# Patient Record
Sex: Female | Born: 1960
Health system: Southern US, Community
[De-identification: ages and names within clinical notes are randomized; demographics above are authoritative.]

## PROBLEM LIST (undated history)

## (undated) DIAGNOSIS — M2550 Pain in unspecified joint: Secondary | ICD-10-CM

## (undated) DIAGNOSIS — M549 Dorsalgia, unspecified: Secondary | ICD-10-CM

## (undated) DIAGNOSIS — I4892 Unspecified atrial flutter: Secondary | ICD-10-CM

## (undated) DIAGNOSIS — G43909 Migraine, unspecified, not intractable, without status migrainosus: Secondary | ICD-10-CM

## (undated) DIAGNOSIS — T7840XA Allergy, unspecified, initial encounter: Secondary | ICD-10-CM

## (undated) DIAGNOSIS — G4730 Sleep apnea, unspecified: Secondary | ICD-10-CM

## (undated) DIAGNOSIS — R0602 Shortness of breath: Secondary | ICD-10-CM

## (undated) DIAGNOSIS — G629 Polyneuropathy, unspecified: Secondary | ICD-10-CM

## (undated) DIAGNOSIS — E669 Obesity, unspecified: Secondary | ICD-10-CM

## (undated) DIAGNOSIS — K5900 Constipation, unspecified: Secondary | ICD-10-CM

## (undated) DIAGNOSIS — J45909 Unspecified asthma, uncomplicated: Secondary | ICD-10-CM

## (undated) DIAGNOSIS — H269 Unspecified cataract: Secondary | ICD-10-CM

## (undated) DIAGNOSIS — E119 Type 2 diabetes mellitus without complications: Secondary | ICD-10-CM

## (undated) DIAGNOSIS — F5102 Adjustment insomnia: Secondary | ICD-10-CM

## (undated) DIAGNOSIS — I441 Atrioventricular block, second degree: Secondary | ICD-10-CM

## (undated) DIAGNOSIS — R5383 Other fatigue: Secondary | ICD-10-CM

## (undated) DIAGNOSIS — R32 Unspecified urinary incontinence: Secondary | ICD-10-CM

## (undated) DIAGNOSIS — F32A Depression, unspecified: Secondary | ICD-10-CM

## (undated) DIAGNOSIS — I10 Essential (primary) hypertension: Secondary | ICD-10-CM

## (undated) DIAGNOSIS — Z8679 Personal history of other diseases of the circulatory system: Secondary | ICD-10-CM

## (undated) DIAGNOSIS — Z859 Personal history of malignant neoplasm, unspecified: Secondary | ICD-10-CM

## (undated) DIAGNOSIS — M719 Bursopathy, unspecified: Secondary | ICD-10-CM

## (undated) HISTORY — DX: Dorsalgia, unspecified: M54.9

## (undated) HISTORY — PX: ABDOMINAL HYSTERECTOMY: SHX81

## (undated) HISTORY — DX: Unspecified atrial flutter: I48.92

## (undated) HISTORY — DX: Allergy, unspecified, initial encounter: T78.40XA

## (undated) HISTORY — DX: Constipation, unspecified: K59.00

## (undated) HISTORY — DX: Depression, unspecified: F32.A

## (undated) HISTORY — DX: Obesity, unspecified: E66.9

## (undated) HISTORY — DX: Sleep apnea, unspecified: G47.30

## (undated) HISTORY — DX: Personal history of malignant neoplasm, unspecified: Z85.9

## (undated) HISTORY — DX: Unspecified urinary incontinence: R32

## (undated) HISTORY — DX: Adjustment insomnia: F51.02

## (undated) HISTORY — DX: Polyneuropathy, unspecified: G62.9

## (undated) HISTORY — DX: Migraine, unspecified, not intractable, without status migrainosus: G43.909

## (undated) HISTORY — DX: Other fatigue: R53.83

## (undated) HISTORY — DX: Unspecified cataract: H26.9

## (undated) HISTORY — DX: Pain in unspecified joint: M25.50

## (undated) HISTORY — DX: Shortness of breath: R06.02

## (undated) HISTORY — DX: Personal history of other diseases of the circulatory system: Z86.79

---

## 2006-05-31 ENCOUNTER — Ambulatory Visit: Payer: Self-pay | Admitting: Family Medicine

## 2006-12-13 ENCOUNTER — Ambulatory Visit: Payer: Self-pay | Admitting: Family Medicine

## 2007-11-17 ENCOUNTER — Ambulatory Visit: Payer: Self-pay | Admitting: Family Medicine

## 2007-11-21 ENCOUNTER — Ambulatory Visit: Payer: Self-pay | Admitting: Family Medicine

## 2008-03-19 ENCOUNTER — Ambulatory Visit: Payer: Self-pay | Admitting: Family Medicine

## 2008-07-16 ENCOUNTER — Ambulatory Visit: Payer: Self-pay | Admitting: Family Medicine

## 2009-07-17 ENCOUNTER — Ambulatory Visit: Payer: Self-pay | Admitting: Family Medicine

## 2009-08-05 ENCOUNTER — Ambulatory Visit: Payer: Self-pay | Admitting: Internal Medicine

## 2009-08-22 ENCOUNTER — Ambulatory Visit: Payer: Self-pay | Admitting: Internal Medicine

## 2009-08-22 LAB — HM COLONOSCOPY: HM Colonoscopy: NORMAL

## 2009-10-05 HISTORY — PX: COLONOSCOPY: SHX174

## 2009-12-31 ENCOUNTER — Ambulatory Visit: Payer: Self-pay | Admitting: Family Medicine

## 2009-12-31 ENCOUNTER — Encounter: Admission: RE | Admit: 2009-12-31 | Discharge: 2009-12-31 | Payer: Self-pay | Admitting: Family Medicine

## 2010-01-14 ENCOUNTER — Ambulatory Visit: Payer: Self-pay | Admitting: Family Medicine

## 2010-03-05 ENCOUNTER — Ambulatory Visit: Payer: Self-pay | Admitting: Physician Assistant

## 2010-07-28 ENCOUNTER — Ambulatory Visit: Payer: Self-pay | Admitting: Family Medicine

## 2010-07-29 LAB — HM COLONOSCOPY: HM Colonoscopy: NORMAL

## 2010-08-08 LAB — HM MAMMOGRAPHY: HM Mammogram: NEGATIVE

## 2010-09-03 ENCOUNTER — Ambulatory Visit: Payer: Self-pay | Admitting: Family Medicine

## 2010-10-05 LAB — HM PAP SMEAR

## 2011-03-18 ENCOUNTER — Encounter: Payer: Self-pay | Admitting: Family Medicine

## 2011-03-18 ENCOUNTER — Ambulatory Visit (INDEPENDENT_AMBULATORY_CARE_PROVIDER_SITE_OTHER): Payer: 59 | Admitting: Family Medicine

## 2011-03-18 ENCOUNTER — Telehealth: Payer: Self-pay | Admitting: Family Medicine

## 2011-03-18 VITALS — BP 140/90 | HR 72 | Wt 255.0 lb

## 2011-03-18 DIAGNOSIS — J301 Allergic rhinitis due to pollen: Secondary | ICD-10-CM | POA: Insufficient documentation

## 2011-03-18 DIAGNOSIS — N390 Urinary tract infection, site not specified: Secondary | ICD-10-CM

## 2011-03-18 DIAGNOSIS — E669 Obesity, unspecified: Secondary | ICD-10-CM

## 2011-03-18 DIAGNOSIS — Z8669 Personal history of other diseases of the nervous system and sense organs: Secondary | ICD-10-CM | POA: Insufficient documentation

## 2011-03-18 DIAGNOSIS — N3941 Urge incontinence: Secondary | ICD-10-CM

## 2011-03-18 LAB — POCT URINALYSIS DIPSTICK
Blood, UA: 250
Glucose, UA: NEGATIVE
Leukocytes, UA: NEGATIVE
Protein, UA: NEGATIVE
Spec Grav, UA: 1.015

## 2011-03-18 MED ORDER — SOLIFENACIN SUCCINATE 5 MG PO TABS: 5.0000 mg | ORAL_TABLET | Freq: Every day | ORAL | Status: DC

## 2011-03-18 MED ORDER — TOLTERODINE TARTRATE ER 4 MG PO CP24: 4.0000 mg | ORAL_CAPSULE | Freq: Every day | ORAL | Status: DC

## 2011-03-18 MED ORDER — CIPROFLOXACIN HCL 500 MG PO TABS: 500.0000 mg | ORAL_TABLET | Freq: Two times a day (BID) | ORAL | Status: AC

## 2011-03-18 NOTE — Telephone Encounter (Signed)
She was switched to Detrol to help save money.

## 2011-03-18 NOTE — Patient Instructions (Signed)
Take all of your antibiotic and call me if you're not better. Also let me know how the Vesicare works to help with your leakage

## 2011-03-18 NOTE — Progress Notes (Signed)
  Subjective:    Patient ID: Laurie Mckinney, female    DOB: Feb 15, 1961, 50 y.o.   MRN: [id number]  HPI 2 days ago she noted cloudy urine that was foul-smelling. Yesterday she developed urgency, dysuria and saw some blood in her urine. No fever, chills or abdominal pain. She also has a several year history of both urge and stress incontinence. She has discussed this with her gynecologist and apparently this is now getting to become more of an issue.  Review of Systems     Objective:   Physical Exam Alert and in no distress. No pain on palpation of the abdomen. Urine microscopic was positive for white cells       Assessment & Plan:  UTI. Stress and urge incontinence  I will treat her again with Cipro. I will also refer her for physical therapy for Koegel maneuvers. She was originally given Information systems manager however it was too expensive and therefore switched to [employer] area

## 2011-04-29 ENCOUNTER — Encounter: Payer: Self-pay | Admitting: Family Medicine

## 2011-05-08 ENCOUNTER — Other Ambulatory Visit: Payer: Self-pay | Admitting: Family Medicine

## 2011-05-08 NOTE — Telephone Encounter (Signed)
Is this ok?

## 2011-05-08 NOTE — Telephone Encounter (Signed)
Med was done by Dr.L

## 2011-08-14 ENCOUNTER — Encounter: Payer: Self-pay | Admitting: Family Medicine

## 2011-08-14 ENCOUNTER — Ambulatory Visit (INDEPENDENT_AMBULATORY_CARE_PROVIDER_SITE_OTHER): Payer: 59 | Admitting: Family Medicine

## 2011-08-14 VITALS — BP 150/90 | HR 60 | Ht 72.0 in | Wt 260.0 lb

## 2011-08-14 DIAGNOSIS — F341 Dysthymic disorder: Secondary | ICD-10-CM

## 2011-08-14 DIAGNOSIS — J301 Allergic rhinitis due to pollen: Secondary | ICD-10-CM

## 2011-08-14 DIAGNOSIS — Z0000 Encounter for general adult medical examination without abnormal findings: Secondary | ICD-10-CM

## 2011-08-14 DIAGNOSIS — E669 Obesity, unspecified: Secondary | ICD-10-CM

## 2011-08-14 DIAGNOSIS — E559 Vitamin D deficiency, unspecified: Secondary | ICD-10-CM

## 2011-08-14 LAB — LIPID PANEL
Total CHOL/HDL Ratio: 2.5 Ratio
Triglycerides: 50 mg/dL (ref <150)
VLDL: 10 mg/dL (ref 0–40)

## 2011-08-14 LAB — CBC WITH DIFFERENTIAL/PLATELET
Basophils Relative: 0 % (ref 0–1)
Eosinophils Relative: 1 % (ref 0–5)
Hemoglobin: 14.2 g/dL (ref 12.0–15.0)
MCV: 93.4 fL (ref 78.0–100.0)
Neutro Abs: 4.6 10*3/uL (ref 1.7–7.7)
Neutrophils Relative %: 60 % (ref 43–77)
Platelets: 307 10*3/uL (ref 150–400)
RBC: 4.7 MIL/uL (ref 3.87–5.11)
RDW: 13.3 % (ref 11.5–15.5)
WBC: 7.6 10*3/uL (ref 4.0–10.5)

## 2011-08-14 LAB — POCT URINALYSIS DIPSTICK
Bilirubin, UA: NEGATIVE
Protein, UA: NEGATIVE
Spec Grav, UA: 1.02
pH, UA: 7

## 2011-08-14 LAB — COMPREHENSIVE METABOLIC PANEL
AST: 23 U/L (ref 0–37)
Albumin: 4.1 g/dL (ref 3.5–5.2)
BUN: 13 mg/dL (ref 6–23)
Calcium: 9.5 mg/dL (ref 8.4–10.5)
Creat: 0.71 mg/dL (ref 0.50–1.10)
Glucose, Bld: 85 mg/dL (ref 70–99)
Potassium: 4.2 mEq/L (ref 3.5–5.3)
Sodium: 140 mEq/L (ref 135–145)
Total Bilirubin: 0.5 mg/dL (ref 0.3–1.2)
Total Protein: 7.7 g/dL (ref 6.0–8.3)

## 2011-08-14 NOTE — Progress Notes (Signed)
  Subjective:    Patient ID: Laurie Mckinney, female    DOB: 01-Apr-1961, 50 y.o.   MRN: [id number]  HPI She is here for complete examination. She does have sleep disturbance mainly due to her busy schedule and the fact that she's gotten use to splitting up her sleep cycle. She also has had some difficulty with her marriage but seems to be handling this quite well and has done her homework in regard to better communication especially in regard to [employer]. Her allergies are under good control. She continues on Paxil and is having no difficulty with that. He has had some bladder issues but presently is not on any medications for OAB. She has a history of vitamin D deficiency who presently is  on a multivitamin regularly.   Review of Systems  Constitutional: Negative.   Eyes: Negative.   Respiratory: Negative.   Cardiovascular: Negative.   Gastrointestinal: Negative.   Skin: Negative.   Hematological: Negative.   Psychiatric/Behavioral: Negative.        Objective:   Physical Exam BP 150/90  Pulse 60  Ht 6' (1.829 m)  Wt 260 lb (117.935 kg)  BMI 35.26 kg/m2  General Appearance:    Alert, cooperative, no distress, appears stated age  Head:    Normocephalic, without obvious abnormality, atraumatic  Eyes:    PERRL, conjunctiva/corneas clear, EOM's intact, fundi    benign  Ears:    Normal TM's and external ear canals  Nose:   Nares normal, mucosa normal, no drainage or sinus   tenderness  Throat:   Lips, mucosa, and tongue normal; teeth and gums normal  Neck:   Supple, no lymphadenopathy;  thyroid:  no   enlargement/tenderness/nodules; no carotid   bruit or JVD  Back:    Spine nontender, no curvature, ROM normal, no CVA     tenderness  Lungs:     Clear to auscultation bilaterally without wheezes, rales or     ronchi; respirations unlabored  Chest Wall:    No tenderness or deformity   Heart:    Regular rate and rhythm, S1 and S2 normal, no murmur, rub   or gallop  Breast Exam:     Deferred to GYN  Abdomen:     Soft, non-tender, nondistended, normoactive bowel sounds,    no masses, no hepatosplenomegaly  Genitalia:    Deferred to GYN     Extremities:   No clubbing, cyanosis or edema  Pulses:   2+ and symmetric all extremities  Skin:   Skin color, texture, turgor normal, no rashes or lesions  Lymph nodes:   Cervical, supraclavicular, and axillary nodes normal  Neurologic:   CNII-XII intact, normal strength, sensation and gait; reflexes 2+ and symmetric throughout          Psych:   Normal mood, affect, hygiene and grooming.           Assessment & Plan:   1. Physical exam, annual  POCT Urinalysis Dipstick, CBC with Differential, Comprehensive metabolic panel, Lipid panel  2. Allergic rhinitis due to pollen    3. Obesity (BMI 30-39.9)  CBC with Differential, Comprehensive metabolic panel, Lipid panel  4. Dysthymia    5. Vitamin D deficiency  Vitamin D 25 hydroxy   encouraged her to continue to work with her husband in regard to that medication about their married life and sex life. Strongly encouraged her to make sure she includes taking care of herself and not just other people. Routine blood screening.

## 2011-09-14 ENCOUNTER — Telehealth: Payer: Self-pay | Admitting: Medical

## 2011-09-14 NOTE — Telephone Encounter (Signed)
PATIENT BELIEVES SHE HAS A UTI. SHE IS COMING IN TOMORROW. WHAT CAN SHE DO UNTIL THEN?

## 2011-09-14 NOTE — Telephone Encounter (Signed)
PATIENT WAS NOTIFIED. CLS 

## 2011-09-14 NOTE — Telephone Encounter (Signed)
Drink lots of water, drink some cranberry juice, come in tomorrow, can take som Ibuprofen or Tylenol for pain.

## 2011-09-15 ENCOUNTER — Ambulatory Visit (INDEPENDENT_AMBULATORY_CARE_PROVIDER_SITE_OTHER): Payer: 59 | Admitting: Medical

## 2011-09-15 ENCOUNTER — Encounter: Payer: Self-pay | Admitting: Medical

## 2011-09-15 DIAGNOSIS — R350 Frequency of micturition: Secondary | ICD-10-CM

## 2011-09-15 DIAGNOSIS — N3090 Cystitis, unspecified without hematuria: Secondary | ICD-10-CM | POA: Insufficient documentation

## 2011-09-15 LAB — POCT URINALYSIS DIPSTICK
Bilirubin, UA: NEGATIVE
Ketones, UA: NEGATIVE
Nitrite, UA: NEGATIVE
Spec Grav, UA: <=1.005
Urobilinogen, UA: NEGATIVE

## 2011-09-15 MED ORDER — PHENAZOPYRIDINE HCL 100 MG PO TABS: 100.0000 mg | ORAL_TABLET | Freq: Three times a day (TID) | ORAL | Status: AC | PRN

## 2011-09-15 MED ORDER — CIPROFLOXACIN HCL 500 MG PO TABS: 500.0000 mg | ORAL_TABLET | Freq: Two times a day (BID) | ORAL | Status: AC

## 2011-09-15 NOTE — Progress Notes (Signed)
Subjective:   HPI  Laurie Mckinney is a 50 y.o. female who presents with possible UTI.  She notes 2 prior this year.  She reports few day hx/o intense urgency, frequency urination, burning, and not being able to hold the urine some.  Went 10 times to urinate in a 3 hour period yesterday.  Denies belly or back pain, no fever, but some chills.  Using nothing for symptoms.  She did start some cranberry juice with some relief.  LMP 16mo ago.  She is having some premenopausal symptoms such as hot flashes.  She has been having sex with her husband more frequently now that kids are out of the house. No other aggravating or relieving factors.    No other c/o.  The following portions of the patient's history were reviewed and updated as appropriate: allergies, current medications, past family history, past medical history, past social history, past surgical history and problem list.  Past Medical History  Diagnosis Date  . Migraine headache   . Allergy     RHINITIS  . Obesity   . Adjustment insomnia     SHIFT WORK    Review of Systems Constitutional: -fever, -chills, -sweats, -unexpected -weight change,-fatigue ENT: -runny nose, -ear pain, -sore throat Cardiology:  -chest pain, -palpitations, -edema Respiratory: -cough, -shortness of breath, -wheezing Gastroenterology: -abdominal pain, -nausea, -vomiting, -diarrhea, -constipation Hematology: -bleeding or bruising problems Musculoskeletal: -arthralgias, -myalgias, -joint swelling, -back pain Ophthalmology: -vision changes Urology: -dysuria, -difficulty urinating, -hematuria, -urinary frequency, +urgency, +burning,+uncontrol bladder Neurology: -headache, -weakness, -tingling, -numbness     Objective:   Physical Exam  Filed Vitals:   09/15/11 1057  BP: 132/82  Pulse: 72  Temp: 98.2 F (36.8 C)  Resp: 16    General appearance: alert, no distress, WD/WN, white femal Abdomen: +bs, soft, non tender, non distended, no masses, no  hepatomegaly, no splenomegaly Back: no cva tenderness Pulses: 2+ symmetric  Assessment and Plan :    Encounter Diagnoses  Name Primary?  . Cystitis Yes  . Urinary frequency    Cipro and pyridium scripts, discussed prevention, hydrate well, can use cranberry juice, call if worse or not improving.   Follow-up otherwise prn.

## 2011-09-15 NOTE — Patient Instructions (Signed)

## 2011-12-22 ENCOUNTER — Telehealth: Payer: Self-pay | Admitting: Family Medicine

## 2011-12-22 MED ORDER — PAROXETINE HCL 20 MG PO TABS: 20.0000 mg | ORAL_TABLET | ORAL | Status: DC

## 2011-12-22 NOTE — Telephone Encounter (Signed)
Paxil called in

## 2012-06-17 ENCOUNTER — Other Ambulatory Visit: Payer: Self-pay | Admitting: Family Medicine

## 2012-06-17 NOTE — Telephone Encounter (Signed)
I renewed her medication. She will need a followup appointment within the next month or 2

## 2012-06-17 NOTE — Telephone Encounter (Signed)
Packs were renewed for several months. She will need a followup in the next month or 2

## 2012-06-17 NOTE — Telephone Encounter (Signed)
IS THIS OK 

## 2012-08-16 ENCOUNTER — Encounter: Payer: Self-pay | Admitting: Internal Medicine

## 2012-08-19 ENCOUNTER — Encounter: Payer: 59 | Admitting: Family Medicine

## 2012-08-24 ENCOUNTER — Encounter: Payer: Self-pay | Admitting: Family Medicine

## 2012-08-24 ENCOUNTER — Ambulatory Visit (INDEPENDENT_AMBULATORY_CARE_PROVIDER_SITE_OTHER): Payer: 59 | Admitting: Family Medicine

## 2012-08-24 VITALS — BP 130/86 | HR 76 | Ht 72.0 in | Wt 268.0 lb

## 2012-08-24 DIAGNOSIS — Z0000 Encounter for general adult medical examination without abnormal findings: Secondary | ICD-10-CM

## 2012-08-24 DIAGNOSIS — Z23 Encounter for immunization: Secondary | ICD-10-CM

## 2012-08-24 DIAGNOSIS — Z8669 Personal history of other diseases of the nervous system and sense organs: Secondary | ICD-10-CM

## 2012-08-24 DIAGNOSIS — J301 Allergic rhinitis due to pollen: Secondary | ICD-10-CM

## 2012-08-24 DIAGNOSIS — N390 Urinary tract infection, site not specified: Secondary | ICD-10-CM

## 2012-08-24 DIAGNOSIS — E669 Obesity, unspecified: Secondary | ICD-10-CM

## 2012-08-24 LAB — LIPID PANEL
HDL: 54 mg/dL (ref >39)
LDL Cholesterol: 72 mg/dL (ref 0–99)
Triglycerides: 57 mg/dL (ref <150)

## 2012-08-24 LAB — COMPREHENSIVE METABOLIC PANEL
ALT: 20 U/L (ref 0–35)
AST: 20 U/L (ref 0–37)
Albumin: 3.9 g/dL (ref 3.5–5.2)
Alkaline Phosphatase: 74 U/L (ref 39–117)
Calcium: 9.1 mg/dL (ref 8.4–10.5)
Chloride: 106 mEq/L (ref 96–112)
Creat: 0.6 mg/dL (ref 0.50–1.10)
Total Bilirubin: 0.5 mg/dL (ref 0.3–1.2)
Total Protein: 7.6 g/dL (ref 6.0–8.3)

## 2012-08-24 LAB — POCT URINALYSIS DIPSTICK
Blood, UA: 250
Glucose, UA: NEGATIVE
Ketones, UA: NEGATIVE
Leukocytes, UA: NEGATIVE
Spec Grav, UA: 1.01

## 2012-08-24 MED ORDER — SULFAMETHOXAZOLE-TRIMETHOPRIM 800-160 MG PO TABS: 1.0000 | ORAL_TABLET | Freq: Two times a day (BID) | ORAL | Status: DC

## 2012-08-24 NOTE — Progress Notes (Signed)
Subjective:    Patient ID: Laurie Mckinney, female    DOB: 1961-01-15, 51 y.o.   MRN: [id number]  HPI She is here for complete examination. Her main complaint today is decreased focus and energy. Further discussion with her in review of the medical record and nursing notes indicates possible depression however her work sleep habits and other obligations make it such that she is getting intermittent sleep. She we'll get several hours when she gets off her third shift job and in several hours more before she goes back. She fills her time with taking care of other responsibilities including going to school. Her only other complaint is some right-sided abdominal pain but no dysuria frequency or urgency. Her allergies and migraine headache issues are not causing trouble. She does not exercise regularly. She does note that lack of sleep does make her want to eat more. She does see her gynecologist regularly. She is up-to-date on her immunizations except for tetanus.  Review of Systems  Constitutional: Positive for fatigue.  HENT: Negative.   Eyes: Negative.   Respiratory: Negative.   Cardiovascular: Negative.   Gastrointestinal: Negative.   Genitourinary: Negative.   Musculoskeletal: Negative.   Neurological: Positive for weakness.  Hematological: Negative.   Psychiatric/Behavioral: Positive for decreased concentration.       Objective:   Physical Exam BP 130/86  Pulse 76  Ht 6' (1.829 m)  Wt 268 lb (121.564 kg)  BMI 36.35 kg/m2  SpO2 99%  LMP 01/26/2011  General Appearance:    Alert, cooperative, no distress, appears stated age  Head:    Normocephalic, without obvious abnormality, atraumatic  Eyes:    PERRL, conjunctiva/corneas clear, EOM's intact, fundi    benign  Ears:    Normal TM's and external ear canals  Nose:   Nares normal, mucosa normal, no drainage or sinus   tenderness  Throat:   Lips, mucosa, and tongue normal; teeth and gums normal  Neck:   Supple, no lymphadenopathy;   thyroid:  no   enlargement/tenderness/nodules; no carotid   bruit or JVD  Back:    Spine nontender, no curvature, ROM normal, no CVA     tenderness  Lungs:     Clear to auscultation bilaterally without wheezes, rales or     ronchi; respirations unlabored  Chest Wall:    No tenderness or deformity   Heart:    Regular rate and rhythm, S1 and S2 normal, no murmur, rub   or gallop  Breast Exam:    Deferred to GYN  Abdomen:     Soft, non-tender, nondistended, normoactive bowel sounds,    no masses, no hepatosplenomegaly  Genitalia:    Deferred to GYN     Extremities:   No clubbing, cyanosis or edema  Pulses:   2+ and symmetric all extremities  Skin:   Skin color, texture, turgor normal, no rashes or lesions  Lymph nodes:   Cervical, supraclavicular, and axillary nodes normal  Neurologic:   CNII-XII intact, normal strength, sensation and gait; reflexes 2+ and symmetric throughout          Psych:   Normal mood, affect, hygiene and grooming.   Urine microscopic did show crystals as well as multiple bacteria.      Assessment & Plan:   1. Immunization due  POCT Urinalysis Dipstick, Tdap vaccine greater than or equal to 7yo IM  2. Routine general medical examination at a health care facility  Lipid panel, CBC with Differential, Comprehensive metabolic panel  3. Obesity (  BMI 30-39.9)  Lipid panel, CBC with Differential, Comprehensive metabolic panel  4. Allergic rhinitis due to pollen    5. History of migraine headaches    6. UTI (lower urinary tract infection)  sulfamethoxazole-trimethoprim (BACTRIM DS,SEPTRA DS) 800-160 MG per tablet   she is to let me know how she does on the Septra. Over 20 minutes spent discussing her lifestyle. I strongly encouraged her to reassess where she is putting all of her energy and put more into aching better care of herself including getting regular sleep. Strongly encouraged her to back out of somebody obligations that she is placed on her self. I see no real  evidence of depression but just overextension.

## 2012-08-25 LAB — CBC WITH DIFFERENTIAL/PLATELET
Basophils Absolute: 0 10*3/uL (ref 0.0–0.1)
Eosinophils Absolute: 0.1 10*3/uL (ref 0.0–0.7)
Eosinophils Relative: 2 % (ref 0–5)
Lymphs Abs: 1.9 10*3/uL (ref 0.7–4.0)
MCV: 89 fL (ref 78.0–100.0)
Monocytes Relative: 9 % (ref 3–12)
WBC: 7.3 10*3/uL (ref 4.0–10.5)

## 2012-08-25 MED ORDER — PAROXETINE HCL 20 MG PO TABS: 20.0000 mg | ORAL_TABLET | ORAL | Status: DC

## 2012-08-25 NOTE — Addendum Note (Signed)
Addended by: Ronnald Nian on: 08/25/2012 08:58 AM   Modules accepted: Orders

## 2012-08-25 NOTE — Progress Notes (Signed)
Quick Note:  Called pt # left message labs look good and that jcl called med in ______

## 2012-09-19 ENCOUNTER — Other Ambulatory Visit: Payer: Self-pay | Admitting: Family Medicine

## 2012-11-14 ENCOUNTER — Encounter: Payer: Self-pay | Admitting: Medical

## 2012-11-14 ENCOUNTER — Ambulatory Visit (INDEPENDENT_AMBULATORY_CARE_PROVIDER_SITE_OTHER): Payer: 59 | Admitting: Medical

## 2012-11-14 VITALS — BP 140/100 | HR 58 | Temp 98.1°F | Wt 273.0 lb

## 2012-11-14 DIAGNOSIS — R030 Elevated blood-pressure reading, without diagnosis of hypertension: Secondary | ICD-10-CM

## 2012-11-14 DIAGNOSIS — M76899 Other specified enthesopathies of unspecified lower limb, excluding foot: Secondary | ICD-10-CM

## 2012-11-14 DIAGNOSIS — M7071 Other bursitis of hip, right hip: Secondary | ICD-10-CM

## 2012-11-14 MED ORDER — DICLOFENAC SODIUM 75 MG PO TBEC: 75.0000 mg | DELAYED_RELEASE_TABLET | Freq: Two times a day (BID) | ORAL | Status: DC

## 2012-11-14 MED ORDER — TRAMADOL HCL 50 MG PO TABS: 50.0000 mg | ORAL_TABLET | Freq: Three times a day (TID) | ORAL | Status: DC | PRN

## 2012-11-14 NOTE — Progress Notes (Signed)
Subjective: Here for c/o right sore hip since last Sunday, been consistently painful, worse with prolonged sitting and with walking initially, but improves after she has been walking a while.  Denies any specific trauma or injury.  Was on her knees at home doing some house work last week.  She first noticed this about a week ago after taking afternoon nap, and when she got up, the hip was sore.   Using Aleve and Extra Strength Tylenol.   The right hip is very sensitive and tender.  Hasn't let up since a week ago.  Trying to work through it.   Doesn't use a lot of stairs, but does use some stairs at work.  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  She thinks she has had prior similar 15 year ago.  This seems to be 3rd time she has had a similar joint pain, and typically this will last a few weeks with lots of pain.   Makes it difficulty to manage her affairs.  Prior bad joint pains in hips and left knee.  Denies numbness, tingling, weakness.  Using Aleve OTC q8 hr.    Review of Systems Constitutional: -fever, -chills, -sweats, -unexpected weight change,-fatigue Cardiology:  -chest pain, -palpitations, -edema Respiratory: -cough, -shortness of breath, -wheezing Gastroenterology: -abdominal pain, -nausea, -vomiting, -diarrhea, -constipation  Hematology: -bleeding or bruising problems Ophthalmology: -vision changes Urology: -dysuria, -difficulty urinating, -hematuria, -urinary frequency, -urgency Neurology: -headache, -weakness, -tingling, -numbness    Objective: Gen: wd, wn, nad Skin: no ecchymosis or erythema Heart: RRR, normal S1, S2, no murmurs MSK: tender over right lateral hip and trochanteric bursa, tender in same area when lying on right side, otherwise nontender, no pain with hip ROM, internal ROM slightly reduced but external ROM normal, otherwise LE exam unremarkable Back: nontender, no scoliosis, normal ROM Neuro: LE strength, DTRs, sensation normal Pulses nomral in  LE   Assessment: Encounter Diagnoses  Name Primary?  . Hip bursitis, right Yes  . Elevated blood pressure reading without diagnosis of hypertension    Plan: Hip bursitis - scripts for Diclofenac and Ultram for short term use for pain and inflammation, advised 4-5 days of rest, ice, elevation.  Then switch to stretching as discussed, ambulation but avoiding lots of bending squatting.   Call/return if worse or not improving, recheck 2 wk.  Elevated BP - she reports not getting a lot of sleep last night, diet and fluid intake has been off the last few days with things going on, stress, and the hip pain.  Advised recheck 2wk.

## 2012-11-14 NOTE — Patient Instructions (Addendum)
Hip Bursitis Bursitis is a swelling and soreness (inflammation) of a fluid-filled sac (bursa). This sac overlies and protects the joints.  CAUSES   Injury.  Overuse of the muscles surrounding the joint.  Arthritis.  Gout.  Infection.  Cold weather.  Inadequate warm-up and conditioning prior to activities. The cause may not be known.  SYMPTOMS   Mild to severe irritation.  Tenderness and swelling over the outside of the hip.  Pain with motion of the hip.  If the bursa becomes infected, a fever may be present. Redness, tenderness, and warmth will develop over the hip. Symptoms usually lessen in 3 to 4 weeks with treatment, but can come back. TREATMENT If conservative treatment does not work, your caregiver may advise draining the bursa and injecting cortisone into the area. This may speed up the healing process. This may also be used as an initial treatment of choice. HOME CARE INSTRUCTIONS   Apply ice to the affected area for 15 to 20 minutes every 3 to 4 hours while awake for the first 2 days. Put the ice in a plastic bag and place a towel between the bag of ice and your skin.  Rest the painful joint as much as possible, but continue to put the joint through a normal range of motion at least 4 times per day. When the pain lessens, begin normal, slow movements and usual activities to help prevent stiffness of the hip.  Only take over-the-counter or prescription medicines for pain, discomfort, or fever as directed by your caregiver.  Use crutches to limit weight bearing on the hip joint, if advised.  Elevate your painful hip to reduce swelling. Use pillows for propping and cushioning your legs and hips.  Gentle massage may provide comfort and decrease swelling. SEEK IMMEDIATE MEDICAL CARE IF:   Your pain increases even during treatment, or you are not improving.  You have a fever.  You have heat and inflammation over the involved bursa.  You have any other questions  or concerns. MAKE SURE YOU:   Understand these instructions.  Will watch your condition.  Will get help right away if you are not doing well or get worse. Document Released: 03/13/2002 Document Revised: 12/14/2011 Document Reviewed: 10/10/2008 Exit[employer] Patient Information 2013 [employer].  

## 2012-11-23 ENCOUNTER — Other Ambulatory Visit: Payer: Self-pay | Admitting: Medical

## 2012-11-23 MED ORDER — DICLOFENAC SODIUM 75 MG PO TBEC: 75.0000 mg | DELAYED_RELEASE_TABLET | Freq: Two times a day (BID) | ORAL | Status: DC

## 2012-11-23 MED ORDER — TRAMADOL HCL 50 MG PO TABS: 50.0000 mg | ORAL_TABLET | Freq: Three times a day (TID) | ORAL | Status: DC | PRN

## 2012-11-23 NOTE — Telephone Encounter (Signed)
Pt would like a refill on these meds? Is this okay?

## 2012-11-29 ENCOUNTER — Encounter: Payer: Self-pay | Admitting: Family Medicine

## 2012-11-29 ENCOUNTER — Other Ambulatory Visit: Payer: Self-pay | Admitting: Medical

## 2012-11-29 MED ORDER — DICLOFENAC SODIUM 75 MG PO TBEC: 75.0000 mg | DELAYED_RELEASE_TABLET | Freq: Two times a day (BID) | ORAL | Status: DC

## 2012-12-02 ENCOUNTER — Telehealth: Payer: Self-pay | Admitting: Medical

## 2012-12-02 ENCOUNTER — Other Ambulatory Visit: Payer: Self-pay | Admitting: Medical

## 2012-12-02 MED ORDER — TRAMADOL HCL 50 MG PO TABS: 50.0000 mg | ORAL_TABLET | Freq: Three times a day (TID) | ORAL | Status: DC | PRN

## 2012-12-02 NOTE — Telephone Encounter (Signed)
Patient is requesting a refill on Tramadol 50 mg. CLS

## 2012-12-06 ENCOUNTER — Ambulatory Visit: Payer: 59 | Admitting: Family Medicine

## 2012-12-07 ENCOUNTER — Encounter: Payer: Self-pay | Admitting: Family Medicine

## 2012-12-07 ENCOUNTER — Ambulatory Visit (INDEPENDENT_AMBULATORY_CARE_PROVIDER_SITE_OTHER): Payer: 59 | Admitting: Family Medicine

## 2012-12-07 VITALS — BP 130/92 | HR 70 | Wt 267.0 lb

## 2012-12-07 DIAGNOSIS — M7061 Trochanteric bursitis, right hip: Secondary | ICD-10-CM

## 2012-12-07 NOTE — Patient Instructions (Addendum)
Use the Tylenol regularly. Take 2 Aleve 2 or 3 times per day regularly for the next several weeks and if you're still in discomfort come back. Heat to your hip and knees 20 minutes 3 times per day

## 2012-12-07 NOTE — Progress Notes (Signed)
  Subjective:    Patient ID: Laurie Mckinney, female    DOB: 1961-04-23, 52 y.o.   MRN: [id number]  HPI She is here for recheck. She states that her right hip pain is now 70% resolved. She then when on to explain that she is now having bilateral knee pain that progressed to 2 mainly at left knee pain and also experiences lower extremity pain bilaterally. It was very difficult to get a good coherent history from her. She states that the Voltaren really did not help. She had no popping locking or grinding. She's concerned that her weight is causing some these issues and I concurred.   Review of Systems     Objective:   Physical Exam Alert and in no distress. Tender to palpation over the right greater trochanter. Hip motion is normal. I lateral knee exam shows no effusion, negative anterior drawer and McMurray's testing. No palpable tenderness noted. Both calves are nontender to palpation and no edema is noted.       Assessment & Plan:  Trochanteric bursitis, right  Knee pain, left  Obesity (BMI 30-39.9) reinforced the fact that her weight is playing a role in her hip and knee pain. Recommend conservative care with Aleve, heat and also Tylenol. If continued difficulty she is to return here. Approximately one half hour spent discussing this with her.

## 2012-12-13 ENCOUNTER — Other Ambulatory Visit: Payer: Self-pay

## 2012-12-13 MED ORDER — PAROXETINE HCL 20 MG PO TABS: 20.0000 mg | ORAL_TABLET | ORAL | Status: DC

## 2012-12-13 NOTE — Telephone Encounter (Signed)
Sent 90 days in

## 2013-05-02 ENCOUNTER — Other Ambulatory Visit: Payer: Self-pay | Admitting: Family Medicine

## 2013-07-10 ENCOUNTER — Other Ambulatory Visit: Payer: Self-pay | Admitting: Family Medicine

## 2013-08-10 ENCOUNTER — Other Ambulatory Visit: Payer: Self-pay

## 2013-08-21 LAB — HM MAMMOGRAPHY: HM Mammogram: NEGATIVE

## 2013-08-22 ENCOUNTER — Encounter: Payer: Self-pay | Admitting: Internal Medicine

## 2013-09-05 ENCOUNTER — Other Ambulatory Visit: Payer: Self-pay | Admitting: Family Medicine

## 2013-09-07 ENCOUNTER — Ambulatory Visit (INDEPENDENT_AMBULATORY_CARE_PROVIDER_SITE_OTHER): Payer: 59 | Admitting: Family Medicine

## 2013-09-07 ENCOUNTER — Encounter: Payer: Self-pay | Admitting: Family Medicine

## 2013-09-07 VITALS — BP 124/80 | HR 64 | Ht 71.0 in | Wt 268.0 lb

## 2013-09-07 DIAGNOSIS — R0609 Other forms of dyspnea: Secondary | ICD-10-CM

## 2013-09-07 DIAGNOSIS — Z8669 Personal history of other diseases of the nervous system and sense organs: Secondary | ICD-10-CM

## 2013-09-07 DIAGNOSIS — J301 Allergic rhinitis due to pollen: Secondary | ICD-10-CM

## 2013-09-07 DIAGNOSIS — Z0000 Encounter for general adult medical examination without abnormal findings: Secondary | ICD-10-CM

## 2013-09-07 DIAGNOSIS — R0683 Snoring: Secondary | ICD-10-CM

## 2013-09-07 DIAGNOSIS — R319 Hematuria, unspecified: Secondary | ICD-10-CM

## 2013-09-07 DIAGNOSIS — G4726 Circadian rhythm sleep disorder, shift work type: Secondary | ICD-10-CM

## 2013-09-07 DIAGNOSIS — E669 Obesity, unspecified: Secondary | ICD-10-CM

## 2013-09-07 LAB — POCT URINALYSIS DIPSTICK
Bilirubin, UA: NEGATIVE
Blood, UA: 50
Glucose, UA: NEGATIVE
Ketones, UA: NEGATIVE
Nitrite, UA: NEGATIVE
Spec Grav, UA: 1.015
pH, UA: 6

## 2013-09-07 LAB — CBC WITH DIFFERENTIAL/PLATELET
Basophils Relative: 0 % (ref 0–1)
Eosinophils Relative: 1 % (ref 0–5)
HCT: 41.3 % (ref 36.0–46.0)
Lymphocytes Relative: 34 % (ref 12–46)
Lymphs Abs: 2.5 10*3/uL (ref 0.7–4.0)
MCV: 90.8 fL (ref 78.0–100.0)
Monocytes Absolute: 0.6 10*3/uL (ref 0.1–1.0)
Neutro Abs: 4.1 10*3/uL (ref 1.7–7.7)
RBC: 4.55 MIL/uL (ref 3.87–5.11)
WBC: 7.4 10*3/uL (ref 4.0–10.5)

## 2013-09-07 LAB — COMPREHENSIVE METABOLIC PANEL
ALT: 19 U/L (ref 0–35)
AST: 19 U/L (ref 0–37)
Albumin: 3.9 g/dL (ref 3.5–5.2)
Calcium: 9.4 mg/dL (ref 8.4–10.5)
Chloride: 103 mEq/L (ref 96–112)
Potassium: 4.9 mEq/L (ref 3.5–5.3)
Sodium: 140 mEq/L (ref 135–145)
Total Protein: 7.4 g/dL (ref 6.0–8.3)

## 2013-09-07 LAB — LIPID PANEL
Cholesterol: 139 mg/dL (ref 0–200)
HDL: 62 mg/dL (ref >39)
VLDL: 10 mg/dL (ref 0–40)

## 2013-09-07 NOTE — Progress Notes (Signed)
Teaching Physician: Sharlot Gowda, MD Dictated By: Judithann Graves  Subjective:  Laurie Mckinney is an obese 52 y.o. female who presents for her annual complete exam. She is also concerned about the deteriorating quality of her sleep over the past 2-3 years. She works 3rd shift Monday-Friday and she sleeps during the day 5 days per week, and sleeps at night 2 days per week. She sleeps for 4-5 hours at a time and has no difficulty falling asleep. She notes that her snoring has increased over the past 2 years and occasionally wakes with snoring. She has no paroxysmal nocturnal dyspneic episodes. She does drink a cup of coffee just prior to her sleep during the workweek and also prior to her shift though she believes that this does not affect her sleep. She relates that she feels excessively sleepy during her work shift and tends to fall asleep at work. She is also concerned about her sleepiness during her 1 hour commute to work. She sees her gynecologist regularly and last year was diagnosed with HPV. She is scheduled to be seen in January. She has a history of migraine headaches, still getting 1-2 migraines per year, and she carries Excedrin with her in her pocketbook. She is able to recognize when she needs to take the Excedrin to abort her migraine. She is trying to lose weight off and on but feels that her disordered sleep is leading to fatigue that makes exercise untenable. She describes no congestion or rhinorrhea though she does have a history of allergic rhinitis. She acknowledges that she is probably consuming too many carbohydrates and would like to decrease her intake of these foods. She has sought recommendations from a dietician in the past and would be open to pursuing this again. She is having no dysuria, increased frequency, urgency, or back pain.  Family and Social histories were reviewed.    ROS negative except as in subjective.  Objective: Filed Vitals:   09/07/13 0820  BP: 124/80   Pulse: 64    Physical Exam:  BP 124/80  Pulse 64  Ht 5\' 11"  (1.803 m)  Wt 268 lb (121.564 kg)  BMI 37.39 kg/m2  SpO2 98%  LMP 01/26/2011  General Appearance:    Alert, cooperative, no distress, appears stated age  Head:    Normocephalic, without obvious abnormality, atraumatic  Eyes:    PERRL, conjunctiva/corneas clear, EOM's intact, fundi    benign  Ears:    Normal TM's and external ear canals  Nose:   Nares normal, mucosa normal, no drainage or sinus   tenderness  Throat:   Lips, mucosa, and tongue normal; teeth and gums normal  Neck:   Supple, no lymphadenopathy;  thyroid:  no   enlargement/tenderness/nodules; no carotid   bruit or JVD  Back:    Spine nontender, no curvature, ROM normal, no CVA     tenderness  Lungs:     Clear to auscultation bilaterally without wheezes, rales or     ronchi; respirations unlabored  Chest Wall:    No tenderness or deformity   Heart:    Regular rate and rhythm, S1 and S2 normal, no murmur, rub   or gallop  Abdomen:     Soft, non-tender, nondistended, normoactive bowel sounds,    no masses, no hepatosplenomegaly  Extremities:   No clubbing, cyanosis or edema  Pulses:   2+ and symmetric all extremities  Skin:   Skin color, texture, turgor normal, no rashes or lesions  Lymph nodes:  Cervical, supraclavicular, and axillary nodes normal  Neurologic:   CNII-XII intact, normal strength, sensation and gait; reflexes 2+ and symmetric throughout          Psych:   Normal mood, affect, hygiene and grooming.   Urine microscopic was negative Assessment and Plan: 1. Blood in urine Benign finding. No signs/symptoms consistent with UTI. - POCT Urinalysis Dipstick  2. Shift work sleep disorder Will need further evaluation with sleep study. - Nocturnal polysomnography; Future  3. Snoring Concern for OSA given body habitus, awareness of night-time snoring, and excessive daytime sleepiness. Will further evaluate with sleep study. - Nocturnal  polysomnography; Future  4. Obesity (BMI 30-39.9) Encouraged her to make diet and exercise changes specifically cutting back on carbohydrates - Amb ref to Medical Nutrition Therapy-MNT  5. History of migraine headaches Able to self-manage.  6. Allergic rhinitis due to pollen Not presently symptomatic.  7. Routine general medical examination at a health care facility - Comprehensive metabolic panel - Lipid panel - CBC with Differential   Dr. Susann Givens was present for the encounter and agrees with the above assessment and plan.

## 2013-10-04 ENCOUNTER — Other Ambulatory Visit: Payer: Self-pay | Admitting: Family Medicine

## 2013-11-24 ENCOUNTER — Ambulatory Visit: Payer: 59 | Admitting: Dietician

## 2014-01-26 ENCOUNTER — Other Ambulatory Visit: Payer: Self-pay | Admitting: Family Medicine

## 2014-04-22 ENCOUNTER — Other Ambulatory Visit: Payer: Self-pay | Admitting: Family Medicine

## 2014-08-17 ENCOUNTER — Other Ambulatory Visit: Payer: Self-pay | Admitting: Family Medicine

## 2014-08-17 NOTE — Telephone Encounter (Signed)
Is this okay?

## 2014-09-17 ENCOUNTER — Other Ambulatory Visit: Payer: Self-pay | Admitting: Family Medicine

## 2014-09-26 LAB — HM MAMMOGRAPHY

## 2014-10-18 ENCOUNTER — Encounter: Payer: Self-pay | Admitting: Family Medicine

## 2014-10-18 ENCOUNTER — Ambulatory Visit (INDEPENDENT_AMBULATORY_CARE_PROVIDER_SITE_OTHER): Payer: 59 | Admitting: Family Medicine

## 2014-10-18 VITALS — BP 138/90 | HR 86 | Ht 70.5 in | Wt 272.0 lb

## 2014-10-18 DIAGNOSIS — N3281 Overactive bladder: Secondary | ICD-10-CM

## 2014-10-18 DIAGNOSIS — Z0000 Encounter for general adult medical examination without abnormal findings: Secondary | ICD-10-CM

## 2014-10-18 DIAGNOSIS — J301 Allergic rhinitis due to pollen: Secondary | ICD-10-CM

## 2014-10-18 DIAGNOSIS — R0683 Snoring: Secondary | ICD-10-CM

## 2014-10-18 DIAGNOSIS — Z8669 Personal history of other diseases of the nervous system and sense organs: Secondary | ICD-10-CM

## 2014-10-18 DIAGNOSIS — E669 Obesity, unspecified: Secondary | ICD-10-CM

## 2014-10-18 DIAGNOSIS — G479 Sleep disorder, unspecified: Secondary | ICD-10-CM

## 2014-10-18 LAB — COMPREHENSIVE METABOLIC PANEL
ALT: 22 U/L (ref 0–35)
AST: 21 U/L (ref 0–37)
Albumin: 3.9 g/dL (ref 3.5–5.2)
Alkaline Phosphatase: 75 U/L (ref 39–117)
BUN: 11 mg/dL (ref 6–23)
CALCIUM: 9.5 mg/dL (ref 8.4–10.5)
CO2: 28 mEq/L (ref 19–32)
Chloride: 102 mEq/L (ref 96–112)
Creat: 0.56 mg/dL (ref 0.50–1.10)
GLUCOSE: 89 mg/dL (ref 70–99)
Potassium: 4.5 mEq/L (ref 3.5–5.3)
Sodium: 139 mEq/L (ref 135–145)
Total Bilirubin: 0.6 mg/dL (ref 0.2–1.2)
Total Protein: 7.7 g/dL (ref 6.0–8.3)

## 2014-10-18 LAB — CBC WITH DIFFERENTIAL/PLATELET
Basophils Absolute: 0 10*3/uL (ref 0.0–0.1)
Basophils Relative: 0 % (ref 0–1)
Eosinophils Absolute: 0.1 10*3/uL (ref 0.0–0.7)
Eosinophils Relative: 1 % (ref 0–5)
HCT: 43.3 % (ref 36.0–46.0)
Hemoglobin: 14.6 g/dL (ref 12.0–15.0)
Lymphocytes Relative: 29 % (ref 12–46)
Lymphs Abs: 2.3 10*3/uL (ref 0.7–4.0)
MCH: 30.2 pg (ref 26.0–34.0)
MCHC: 33.7 g/dL (ref 30.0–36.0)
MCV: 89.6 fL (ref 78.0–100.0)
MPV: 10.5 fL (ref 8.6–12.4)
Monocytes Absolute: 0.6 10*3/uL (ref 0.1–1.0)
Monocytes Relative: 8 % (ref 3–12)
Neutro Abs: 4.9 10*3/uL (ref 1.7–7.7)
Neutrophils Relative %: 62 % (ref 43–77)
Platelets: 320 10*3/uL (ref 150–400)
RBC: 4.83 MIL/uL (ref 3.87–5.11)
RDW: 13.2 % (ref 11.5–15.5)
WBC: 7.9 10*3/uL (ref 4.0–10.5)

## 2014-10-18 LAB — LIPID PANEL
Cholesterol: 142 mg/dL (ref 0–200)
HDL: 55 mg/dL (ref >39)
LDL Cholesterol: 76 mg/dL (ref 0–99)
Total CHOL/HDL Ratio: 2.6 Ratio
Triglycerides: 55 mg/dL (ref <150)
VLDL: 11 mg/dL (ref 0–40)

## 2014-10-18 LAB — POCT URINALYSIS DIPSTICK
BILIRUBIN UA: NEGATIVE
Blood, UA: POSITIVE
Glucose, UA: NEGATIVE
Ketones, UA: NEGATIVE
Leukocytes, UA: NEGATIVE
Nitrite, UA: NEGATIVE
PH UA: 7
PROTEIN UA: NEGATIVE
SPEC GRAV UA: 1.015
Urobilinogen, UA: NEGATIVE

## 2014-10-18 MED ORDER — SOLIFENACIN SUCCINATE 5 MG PO TABS: 5.0000 mg | ORAL_TABLET | Freq: Every day | ORAL | Status: DC

## 2014-10-18 NOTE — Progress Notes (Signed)
Subjective:    Patient ID: Laurie Mckinney, female    DOB: 06-Jul-1961, 54 y.o.   MRN: [id number]  HPI She is here for complete examination. She has continued difficulty with urinary urgency. In the past she had been given a prescription however it caused too much and she never went on the medication. She does have a history of migraine headaches but presently he has had none in the recent past. She does complain of difficulty with snoring, falling asleep easily at work and while driving. She also has some slight twitching in her legs when she gets fatigued. She also snores. She does work different shifts which interferes with her sleeping patterns. She is also postmenopausal and is having difficulty with vaginal dryness. Her allergies are under good control. Her weight is been slowly going up and she blames this on her eating habits due to swing shifts. Family and social history was reviewed. Her marriage is going quite well.   Review of Systems  All other systems reviewed and are negative.      Objective:   Physical Exam BP 138/90 mmHg  Pulse 86  Ht 5' 10.5" (1.791 m)  Wt 272 lb (123.378 kg)  BMI 38.46 kg/m2  SpO2 98%  LMP 01/26/2011  General Appearance:    Alert, cooperative, no distress, appears stated age  Head:    Normocephalic, without obvious abnormality, atraumatic  Eyes:    PERRL, conjunctiva/corneas clear, EOM's intact, fundi    benign  Ears:    Normal TM's and external ear canals  Nose:   Nares normal, mucosa normal, no drainage or sinus   tenderness  Throat:   Lips, mucosa, and tongue normal; teeth and gums normal  Neck:   Supple, no lymphadenopathy;  thyroid:  no   enlargement/tenderness/nodules; no carotid   bruit or JVD  Back:    Spine nontender, no curvature, ROM normal, no CVA     tenderness  Lungs:     Clear to auscultation bilaterally without wheezes, rales or     ronchi; respirations unlabored  Chest Wall:    No tenderness or deformity   Heart:    Regular  rate and rhythm, S1 and S2 normal, no murmur, rub   or gallop  Breast Exam:    Deferred to GYN  Abdomen:     Soft, non-tender, nondistended, normoactive bowel sounds,    no masses, no hepatosplenomegaly  Genitalia:    Deferred to GYN     Extremities:   No clubbing, cyanosis or edema  Pulses:   2+ and symmetric all extremities  Skin:   Skin color, texture, turgor normal, no rashes or lesions  Lymph nodes:   Cervical, supraclavicular, and axillary nodes normal  Neurologic:   CNII-XII intact, normal strength, sensation and gait; reflexes 2+ and symmetric throughout          Psych:   Normal mood, affect, hygiene and grooming.    Urine microscopic shows scattered red cells.      Assessment & Plan:  Routine general medical examination at a health care facility - Plan: CBC with Differential, Comprehensive metabolic panel, Lipid panel  OAB (overactive bladder) - Plan: POCT Urinalysis Dipstick, solifenacin (VESICARE) 5 MG tablet  Allergic rhinitis due to pollen  History of migraine headaches  Obesity (BMI 30-39.9) - Plan: Nocturnal polysomnography  Sleep disturbance - Plan: Nocturnal polysomnography  Snoring - Plan: Nocturnal polysomnography Discussed diet and exercise with her. I will also try her on Vesicare to see if  this will help with her OAB symptoms. Discussed possible side effects. We'll also get a sleep study. Recommend vaginal lubricants.

## 2014-10-19 ENCOUNTER — Other Ambulatory Visit: Payer: Self-pay | Admitting: Family Medicine

## 2014-10-19 NOTE — Telephone Encounter (Signed)
IS THIS OKAY TO REFILL 

## 2014-10-25 ENCOUNTER — Telehealth: Payer: Self-pay | Admitting: Family Medicine

## 2014-10-31 ENCOUNTER — Other Ambulatory Visit: Payer: Self-pay

## 2014-10-31 NOTE — Telephone Encounter (Signed)
P.A. Laurin Coder approved til 10/31/16, faxed pharmacy, left message for pt

## 2014-12-03 ENCOUNTER — Telehealth: Payer: Self-pay

## 2014-12-03 NOTE — Telephone Encounter (Signed)
Left message for pt to call and make appointment to discuss sleep test

## 2015-01-07 ENCOUNTER — Encounter: Payer: Self-pay | Admitting: Gynecologic Oncology

## 2015-01-07 ENCOUNTER — Ambulatory Visit: Payer: 59 | Attending: Gynecologic Oncology | Admitting: Gynecologic Oncology

## 2015-01-07 VITALS — BP 168/85 | HR 70 | Temp 98.3°F | Resp 18 | Ht 70.5 in | Wt 271.0 lb

## 2015-01-07 DIAGNOSIS — N8502 Endometrial intraepithelial neoplasia [EIN]: Secondary | ICD-10-CM

## 2015-01-07 NOTE — Progress Notes (Signed)
Consult Note: Gyn-Onc  Consult was requested by Dr. Ulanda Edison for the evaluation of Laurie Mckinney 54 y.o. female with complex endometrial hyperplasia with atypia  CC:  Chief Complaint  Patient presents with  . complex endometrial hyperplasia with atypia    Assessment/Plan:  Laurie Mckinney  is a 54 y.o.  year old complex endometrial hyperplasia with atypia.  I discussed both medical (progestin) and surgical (hysterectomy) options. I recommend total hysterectomy, BSO given her age and its definitive treatment of the condition, as well as its ability to identify occult co-existent cancer which is present 30% of the time. I discussed that we would send the uterine specimen for frozen section and if invasive cancer was identified that was >2cm, deeply invasive or grade 2 or 3, we would perform lymphadenectomy.  I discussed that I recommend the robotic approach for her given her body habitus. I discussed operative risks including  bleeding, infection, damage to internal organs (such as bladder,ureters, bowels), blood clot, reoperation and rehospitalization. I discussed that there are risks associated with steep trendelenberg and positioning of the arms, particularly neurologic risks. I discussed risks of lymphedema if lymph node dissection is appropriate.  She is electing to proceed with robotic assisted TLH, BSO, possible lymphadenectomy.    HPI:  Laurie Mckinney is a 54 year old gravida 2 para 2 who is seen in consultation at the request of Dr. Ulanda Edison for complex endometrial hyperplasia with atypia. The patient has singular episode of postmenopausal bleeding approximate one month ago. This is now completely resolved. She been amenorrheic for a proximally 18 months prior to this and during her menstrual history had regular monthly menses. As part of workup of her postmenopausal bleeding her gynecologist Dr. Lannette Donath performed an endometrial biopsy on 12/21/2014. This revealed complex  endometrial hyperplasia with atypia.   She has had no prior abdominal surgeries and 2 vaginal deliveries.  Interval History: no further vaginal bleeding  Current Meds:  Outpatient Encounter Prescriptions as of 01/07/2015  Medication Sig  . acetaminophen (TYLENOL ARTHRITIS PAIN) 650 MG CR tablet Take 650 mg by mouth every 8 (eight) hours as needed for pain.  . Multiple Vitamins-Minerals (MULTIVITAMIN WITH MINERALS) tablet Take 1 tablet by mouth daily.    Marland Kitchen PARoxetine (PAXIL) 20 MG tablet TAKE 1 TABLET BY MOUTH EVERY DAY  . Pyridoxine HCl (VITAMIN B-6) 500 MG tablet Take 500 mg by mouth daily.  . [DISCONTINUED] PARoxetine (PAXIL) 20 MG tablet TAKE 1 TABLET BY MOUTH EVERY DAY  . [DISCONTINUED] solifenacin (VESICARE) 5 MG tablet Take 1 tablet (5 mg total) by mouth daily.    Allergy: No Known Allergies  Social Hx:   History   Social History  . Marital Status: Married    Spouse Name: N/A  . Number of Children: N/A  . Years of Education: N/A   Occupational History  . Not on file.   Social History Main Topics  . Smoking status: Never Smoker   . Smokeless tobacco: Never Used  . Alcohol Use: No  . Drug Use: No  . Sexual Activity: Yes   Other Topics Concern  . Not on file   Social History Narrative    Past Surgical Hx:  Past Surgical History  Procedure Laterality Date  . Colonoscopy  2011    Dr.Brodie    Past Medical Hx:  Past Medical History  Diagnosis Date  . Migraine headache   . Allergy     RHINITIS  . Obesity   . Adjustment insomnia  SHIFT WORK    Past Gynecological History:  SVD x 2  Patient's last menstrual period was 01/26/2011.  Family Hx:  Family History  Problem Relation Age of Onset  . Arthritis Mother   . Heart disease Father     Review of Systems:  Constitutional  Feels well,    ENT Normal appearing ears and nares bilaterally Skin/Breast  No rash, sores, jaundice, itching, dryness Cardiovascular  No chest pain, shortness of breath,  or edema  Pulmonary  No cough or wheeze.  Gastro Intestinal  No nausea, vomitting, or diarrhoea. No bright red blood per rectum, no abdominal pain, change in bowel movement, or constipation.  Genito Urinary  No frequency, urgency, dysuria, see HPI Musculo Skeletal  No myalgia, arthralgia, joint swelling or pain  Neurologic  No weakness, numbness, change in gait,  Psychology  No depression, anxiety, insomnia.   Vitals:  Blood pressure 168/85, pulse 70, temperature 98.3 F (36.8 C), temperature source Oral, resp. rate 18, height 5' 10.5" (1.791 m), weight 271 lb (122.925 kg), last menstrual period 01/26/2011.  Physical Exam: WD in NAD Neck  Supple NROM, without any enlargements.  Lymph Node Survey No cervical supraclavicular or inguinal adenopathy Cardiovascular  Pulse normal rate, regularity and rhythm. S1 and S2 normal.  Lungs  Clear to auscultation bilateraly, without wheezes/crackles/rhonchi. Good air movement.  Skin  No rash/lesions/breakdown  Psychiatry  Alert and oriented to person, place, and time  Abdomen  Normoactive bowel sounds, abdomen soft, non-tender and obese without evidence of hernia.  Back No CVA tenderness Genito Urinary  Vulva/vagina: Normal external female genitalia.   No lesions. No discharge or bleeding.  Bladder/urethra:  No lesions or masses, well supported bladder  Vagina: no lesions  Cervix: Normal appearing, no lesions.  Uterus: Small, mobile, no parametrial involvement or nodularity.  Adnexa: no palpable masses. Rectal  Good tone, no masses no cul de sac nodularity.  Extremities  No bilateral cyanosis, clubbing or edema.   Laurie Eva, MD   01/07/2015, 3:38 PM

## 2015-01-07 NOTE — Patient Instructions (Signed)
Preparing for your Surgery  Plan for surgery on May 3 with Dr. Denman George.  Pre-operative Testing -You will receive a phone call from presurgical testing at [facility] to arrange for a pre-operative testing appointment before your surgery.  This appointment normally occurs one to two weeks before your scheduled surgery.   -Bring your insurance card, copy of an advanced directive if applicable, medication list  -At that visit, you will be asked to sign a consent for a possible blood transfusion in case a transfusion becomes necessary during surgery.  The need for a blood transfusion is rare but having consent is a necessary part of your care.     -You should not be taking blood thinners or aspirin at least ten days prior to surgery unless instructed by your surgeon.  Day Before Surgery at [place] will be asked to take in only clear liquids the day before surgery.  Examples of clear liquids include broths, jello, and clear juices. You will be advised to have nothing to eat or drink after midnight the evening before.    Your role in recovery Your role is to become active as soon as directed by your doctor, while still giving yourself time to heal.  Rest when you feel tired. You will be asked to do the following in order to speed your recovery:  - Cough and breathe deeply. This helps toclear and expand your lungs and can prevent pneumonia. You may be given a spirometer to practice deep breathing. A staff member will show you how to use the spirometer. - Do mild physical activity. Walking or moving your legs help your circulation and body functions return to normal. A staff member will help you when you try to walk and will provide you with simple exercises. Do not try to get up or walk alone the first time. - Actively manage your pain. Managing your pain lets you move in comfort. We will ask you to rate your pain on a scale of zero to 10. It is your responsibility to tell your  doctor or nurse where and how much you hurt so your pain can be treated.  Special Considerations -If you are diabetic, you may be placed on insulin after surgery to have closer control over your blood sugars to promote healing and recovery.  This does not mean that you will be discharged on insulin.  If applicable, your oral antidiabetics will be resumed when you are tolerating a solid diet.  -Your final pathology results from surgery should be available by the Friday after surgery and the results will be relayed to you when available.  Blood Transfusion Information WHAT IS A BLOOD TRANSFUSION? A transfusion is the replacement of blood or some of its parts. Blood is made up of multiple cells which provide different functions.  Red blood cells carry oxygen and are used for blood loss replacement.  White blood cells fight against infection.  Platelets control bleeding.  Plasma helps clot blood.  Other blood products are available for specialized needs, such as hemophilia or other clotting disorders. BEFORE THE TRANSFUSION  Who gives blood for transfusions?   You may be able to donate blood to be used at a later date on yourself (autologous donation).  Relatives can be asked to donate blood. This is generally not any safer than if you have received blood from a stranger. The same precautions are taken to ensure safety when a relative's blood is donated.  Healthy volunteers who are fully evaluated  to make sure their blood is safe. This is blood bank blood. Transfusion therapy is the safest it has ever been in the practice of medicine. Before blood is taken from a donor, a complete history is taken to make sure that person has no history of diseases nor engages in risky social behavior (examples are intravenous drug use or sexual activity with multiple partners). The donor's travel history is screened to minimize risk of transmitting infections, such as malaria. The donated blood is tested for  signs of infectious diseases, such as HIV and hepatitis. The blood is then tested to be sure it is compatible with you in order to minimize the chance of a transfusion reaction. If you or a relative donates blood, this is often done in anticipation of surgery and is not appropriate for emergency situations. It takes many days to process the donated blood. RISKS AND COMPLICATIONS Although transfusion therapy is very safe and saves many lives, the main dangers of transfusion include:   Getting an infectious disease.  Developing a transfusion reaction. This is an allergic reaction to something in the blood you were given. Every precaution is taken to prevent this. The decision to have a blood transfusion has been considered carefully by your caregiver before blood is given. Blood is not given unless the benefits outweigh the risks.

## 2015-01-29 NOTE — Patient Instructions (Addendum)
Laurie Mckinney  01/29/2015   Your procedure is scheduled on: Tuesday May 3rd, 2015  Report to [facility] Main  Entrance and follow signs to               [place] at 945  AM.  Call this number if you have problems the morning of surgery [phone]   Remember:  Do not eat food  :After Midnight Sunday night, clear liquids all day Monday 02-04-2015. Nothing by mouth after midnight Monday night.      Take these medicines the morning of surgery with A SIP OF WATER: Paroxetine                               You may not have any metal on your body including hair pins and              piercings  Do not wear jewelry, make-up, lotions, powders or perfumes.             Do not wear nail polish.  Do not shave  48 hours prior to surgery.              Men may shave face and neck.   Do not bring valuables to the hospital. [place].  Contacts, dentures or bridgework may not be worn into surgery.  Leave suitcase in the car. After surgery it may be brought to your room.     Patients discharged the day of surgery will not be allowed to drive home.  Name and phone number of your driver:  Special Instructions: N/A              Please read over the following fact sheets you were given: _____________________________________________________________________                CLEAR LIQUID DIET   Foods Allowed                                                                     Foods Excluded  Coffee and tea, regular and decaf                             liquids that you cannot  Plain Jell-O in any flavor                                             see through such as: Fruit ices (not with fruit pulp)                                     milk, soups, orange juice  Iced Popsicles  All solid food Carbonated beverages, regular and diet                                     Cranberry, grape and apple juices Sports drinks like Gatorade Lightly seasoned clear broth or consume(fat free) Sugar, honey syrup  Sample Menu Breakfast                                Lunch                                     Supper Cranberry juice                    Beef broth                            Chicken broth Jell-O                                     Grape juice                           Apple juice Coffee or tea                        Jell-O                                      Popsicle                                                Coffee or tea                        Coffee or tea  _____________________________________________________________________              [facility] - Preparing for Surgery Before surgery, you can play an important role.  Because skin is not sterile, your skin needs to be as free of germs as possible.  You can reduce the number of germs on your skin by washing with CHG (chlorahexidine gluconate) soap before surgery.  CHG is an antiseptic cleaner which kills germs and bonds with the skin to continue killing germs even after washing. Please DO NOT use if you have an allergy to CHG or antibacterial soaps.  If your skin becomes reddened/irritated stop using the CHG and inform your nurse when you arrive at Short Stay. Do not shave (including legs and underarms) for at least 48 hours prior to the first CHG shower.  You may shave your face/neck. Please follow these instructions carefully:  1.  Shower with CHG Soap the night before surgery and the  morning of Surgery.  2.  If you choose to wash your hair, wash your hair first as usual with your  normal  shampoo.  3.  After you shampoo, rinse your hair and body thoroughly to remove the  shampoo.                           4.  Use CHG as you would any other liquid soap.  You can apply chg directly  to the skin and wash                       Gently with a scrungie or clean washcloth.  5.  Apply the CHG Soap to  your body ONLY FROM THE NECK DOWN.   Do not use on face/ open                           Wound or open sores. Avoid contact with eyes, ears mouth and genitals (private parts).                       Wash face,  Genitals (private parts) with your normal soap.             6.  Wash thoroughly, paying special attention to the area where your surgery  will be performed.  7.  Thoroughly rinse your body with warm water from the neck down.  8.  DO NOT shower/wash with your normal soap after using and rinsing off  the CHG Soap.                9.  Pat yourself dry with a clean towel.            10.  Wear clean pajamas.            11.  Place clean sheets on your bed the night of your first shower and do not  sleep with pets. Day of Surgery : Do not apply any lotions/deodorants the morning of surgery.  Please wear clean clothes to the hospital/surgery center.  FAILURE TO FOLLOW THESE INSTRUCTIONS MAY RESULT IN THE CANCELLATION OF YOUR SURGERY PATIENT SIGNATURE_________________________________  NURSE SIGNATURE__________________________________  ________________________________________________________________________   Adam Phenix  An incentive spirometer is a tool that can help keep your lungs clear and active. This tool measures how well you are filling your lungs with each breath. Taking long deep breaths may help reverse or decrease the chance of developing breathing (pulmonary) problems (especially infection) following:  A long period of time when you are unable to move or be active. BEFORE THE PROCEDURE   If the spirometer includes an indicator to show your best effort, your nurse or respiratory therapist will set it to a desired goal.  If possible, sit up straight or lean slightly forward. Try not to slouch.  Hold the incentive spirometer in an upright position. INSTRUCTIONS FOR USE   Sit on the edge of your bed if possible, or sit up as far as you can in bed or on a chair.  Hold  the incentive spirometer in an upright position.  Breathe out normally.  Place the mouthpiece in your mouth and seal your lips tightly around it.  Breathe in slowly and as deeply as possible, raising the piston or the ball toward the top of the column.  Hold your breath for 3-5 seconds or for as long as possible. Allow the piston or ball to fall to the bottom of the column.  Remove the mouthpiece from your mouth and breathe out normally.  Rest for a few seconds and repeat Steps 1 through 7 at least 10 times  every 1-2 hours when you are awake. Take your time and take a few normal breaths between deep breaths.  The spirometer may include an indicator to show your best effort. Use the indicator as a goal to work toward during each repetition.  After each set of 10 deep breaths, practice coughing to be sure your lungs are clear. If you have an incision (the cut made at the time of surgery), support your incision when coughing by placing a pillow or rolled up towels firmly against it. Once you are able to get out of bed, walk around indoors and cough well. You may stop using the incentive spirometer when instructed by your caregiver.  RISKS AND COMPLICATIONS  Take your time so you do not get dizzy or light-headed.  If you are in pain, you may need to take or ask for pain medication before doing incentive spirometry. It is harder to take a deep breath if you are having pain. AFTER USE  Rest and breathe slowly and easily.  It can be helpful to keep track of a log of your progress. Your caregiver can provide you with a simple table to help with this. If you are using the spirometer at home, follow these instructions: [place] IF:   You are having difficultly using the spirometer.  You have trouble using the spirometer as often as instructed.  Your pain medication is not giving enough relief while using the spirometer.  You develop fever of 100.5 F (38.1 C) or higher. SEEK  IMMEDIATE MEDICAL CARE IF:   You cough up bloody sputum that had not been present before.  You develop fever of 102 F (38.9 C) or greater.  You develop worsening pain at or near the incision site. MAKE SURE YOU:   Understand these instructions.  Will watch your condition.  Will get help right away if you are not doing well or get worse. Document Released: 02/01/2007 Document Revised: 12/14/2011 Document Reviewed: 04/04/2007 ExitCare Patient Information 2014 ExitCare, [place].   ________________________________________________________________________  WHAT IS A BLOOD TRANSFUSION? Blood Transfusion Information  A transfusion is the replacement of blood or some of its parts. Blood is made up of multiple cells which provide different functions.  Red blood cells carry oxygen and are used for blood loss replacement.  White blood cells fight against infection.  Platelets control bleeding.  Plasma helps clot blood.  Other blood products are available for specialized needs, such as hemophilia or other clotting disorders. BEFORE THE TRANSFUSION  Who gives blood for transfusions?   Healthy volunteers who are fully evaluated to make sure their blood is safe. This is blood bank blood. Transfusion therapy is the safest it has ever been in the practice of medicine. Before blood is taken from a donor, a complete history is taken to make sure that person has no history of diseases nor engages in risky social behavior (examples are intravenous drug use or sexual activity with multiple partners). The donor's travel history is screened to minimize risk of transmitting infections, such as malaria. The donated blood is tested for signs of infectious diseases, such as HIV and hepatitis. The blood is then tested to be sure it is compatible with you in order to minimize the chance of a transfusion reaction. If you or a relative donates blood, this is often done in anticipation of surgery and is not  appropriate for emergency situations. It takes many days to process the donated blood. RISKS AND COMPLICATIONS Although transfusion therapy is very  safe and saves many lives, the main dangers of transfusion include:   Getting an infectious disease.  Developing a transfusion reaction. This is an allergic reaction to something in the blood you were given. Every precaution is taken to prevent this. The decision to have a blood transfusion has been considered carefully by your caregiver before blood is given. Blood is not given unless the benefits outweigh the risks. AFTER THE TRANSFUSION  Right after receiving a blood transfusion, you will usually feel much better and more energetic. This is especially true if your red blood cells have gotten low (anemic). The transfusion raises the level of the red blood cells which carry oxygen, and this usually causes an energy increase.  The nurse administering the transfusion will monitor you carefully for complications. HOME CARE INSTRUCTIONS  No special instructions are needed after a transfusion. You may find your energy is better. Speak with your caregiver about any limitations on activity for underlying diseases you may have. SEEK MEDICAL CARE IF:   Your condition is not improving after your transfusion.  You develop redness or irritation at the intravenous (IV) site. SEEK IMMEDIATE MEDICAL CARE IF:  Any of the following symptoms occur over the next 12 hours:  Shaking chills.  You have a temperature by mouth above 102 F (38.9 C), not controlled by medicine.  Chest, back, or muscle pain.  People around you feel you are not acting correctly or are confused.  Shortness of breath or difficulty breathing.  Dizziness and fainting.  You get a rash or develop hives.  You have a decrease in urine output.  Your urine turns a dark color or changes to pink, red, or brown. Any of the following symptoms occur over the next 10 days:  You have a  temperature by mouth above 102 F (38.9 C), not controlled by medicine.  Shortness of breath.  Weakness after normal activity.  The white part of the eye turns yellow (jaundice).  You have a decrease in the amount of urine or are urinating less often.  Your urine turns a dark color or changes to pink, red, or brown. Document Released: 09/18/2000 Document Revised: 12/14/2011 Document Reviewed: 05/07/2008 [facility] Patient Information 2014 [place], [place].  _______________________________________________________________________

## 2015-01-30 ENCOUNTER — Telehealth: Payer: Self-pay | Admitting: *Deleted

## 2015-01-30 ENCOUNTER — Encounter (HOSPITAL_COMMUNITY): Payer: Self-pay

## 2015-01-30 ENCOUNTER — Encounter (HOSPITAL_COMMUNITY)
Admission: RE | Admit: 2015-01-30 | Discharge: 2015-01-30 | Disposition: A | Discharge status: Home / Self Care | Payer: 59 | Source: Ambulatory Visit | Attending: Gynecologic Oncology | Admitting: Gynecologic Oncology

## 2015-01-30 DIAGNOSIS — Z01812 Encounter for preprocedural laboratory examination: Secondary | ICD-10-CM | POA: Insufficient documentation

## 2015-01-30 DIAGNOSIS — Z01810 Encounter for preprocedural cardiovascular examination: Secondary | ICD-10-CM | POA: Diagnosis present

## 2015-01-30 DIAGNOSIS — Z6838 Body mass index (BMI) 38.0-38.9, adult: Secondary | ICD-10-CM | POA: Diagnosis not present

## 2015-01-30 DIAGNOSIS — N8500 Endometrial hyperplasia, unspecified: Secondary | ICD-10-CM | POA: Insufficient documentation

## 2015-01-30 DIAGNOSIS — N8502 Endometrial intraepithelial neoplasia [EIN]: Secondary | ICD-10-CM | POA: Diagnosis present

## 2015-01-30 DIAGNOSIS — G43909 Migraine, unspecified, not intractable, without status migrainosus: Secondary | ICD-10-CM | POA: Diagnosis not present

## 2015-01-30 DIAGNOSIS — E669 Obesity, unspecified: Secondary | ICD-10-CM | POA: Diagnosis not present

## 2015-01-30 HISTORY — DX: Bursopathy, unspecified: M71.9

## 2015-01-30 LAB — CBC WITH DIFFERENTIAL/PLATELET
BASOS ABS: 0 10*3/uL (ref 0.0–0.1)
Basophils Relative: 0 % (ref 0–1)
Eosinophils Absolute: 0.1 10*3/uL (ref 0.0–0.7)
Eosinophils Relative: 1 % (ref 0–5)
HCT: 41.9 % (ref 36.0–46.0)
Hemoglobin: 13.5 g/dL (ref 12.0–15.0)
LYMPHS ABS: 2.3 10*3/uL (ref 0.7–4.0)
LYMPHS PCT: 33 % (ref 12–46)
MCH: 30.3 pg (ref 26.0–34.0)
MCHC: 32.2 g/dL (ref 30.0–36.0)
MCV: 94.2 fL (ref 78.0–100.0)
MONO ABS: 0.6 10*3/uL (ref 0.1–1.0)
Monocytes Relative: 8 % (ref 3–12)
NEUTROS PCT: 58 % (ref 43–77)
Neutro Abs: 4 10*3/uL (ref 1.7–7.7)
PLATELETS: 292 10*3/uL (ref 150–400)
RBC: 4.45 MIL/uL (ref 3.87–5.11)
RDW: 13.1 % (ref 11.5–15.5)
WBC: 6.9 10*3/uL (ref 4.0–10.5)

## 2015-01-30 LAB — COMPREHENSIVE METABOLIC PANEL
ALBUMIN: 3.8 g/dL (ref 3.5–5.2)
ALT: 20 U/L (ref 0–35)
AST: 23 U/L (ref 0–37)
Alkaline Phosphatase: 82 U/L (ref 39–117)
Anion gap: 8 (ref 5–15)
BUN: 14 mg/dL (ref 6–23)
CALCIUM: 9 mg/dL (ref 8.4–10.5)
CO2: 27 mmol/L (ref 19–32)
Chloride: 104 mmol/L (ref 96–112)
Creatinine, Ser: 0.61 mg/dL (ref 0.50–1.10)
GFR calc non Af Amer: >90 mL/min (ref >90)
GLUCOSE: 110 mg/dL — AB (ref 70–99)
Potassium: 4.3 mmol/L (ref 3.5–5.1)
Sodium: 139 mmol/L (ref 135–145)
Total Bilirubin: 0.5 mg/dL (ref 0.3–1.2)
Total Protein: 7.9 g/dL (ref 6.0–8.3)

## 2015-01-30 LAB — URINALYSIS, ROUTINE W REFLEX MICROSCOPIC
BILIRUBIN URINE: NEGATIVE
Glucose, UA: NEGATIVE mg/dL
Ketones, ur: NEGATIVE mg/dL
LEUKOCYTES UA: NEGATIVE
Nitrite: NEGATIVE
PH: 6.5 (ref 5.0–8.0)
Protein, ur: NEGATIVE mg/dL
SPECIFIC GRAVITY, URINE: 1.014 (ref 1.005–1.030)
Urobilinogen, UA: 0.2 mg/dL (ref 0.0–1.0)

## 2015-01-30 LAB — URINE MICROSCOPIC-ADD ON

## 2015-01-30 LAB — ABO/RH: ABO/RH(D): O POS

## 2015-01-30 NOTE — Telephone Encounter (Signed)
Spoke with patient and she prefers to see a cardiologist in [place]. Spoke with Tammy, nurse at [facility] in [place], and she states patient can be seen tomorrow morning at 9:40am. Called patient and she is agreeable to this appointment and will arrive at 9:20am to check-in. Patient given address and contact number for the office in case she has any questions prior to her appt tomorrow. Records faxed to Tammy at [phone].

## 2015-01-30 NOTE — Progress Notes (Signed)
   01/30/15 0921  OBSTRUCTIVE SLEEP APNEA  Have you ever been diagnosed with sleep apnea through a sleep study? No (sleep study 2 months ago dr Shanda Bumps, no results in epic yet, pt not told results yet)  Do you snore loudly (loud enough to be heard through closed doors)?  1  Do you often feel tired, fatigued, or sleepy during the daytime? 0  Has anyone observed you stop breathing during your sleep? 1  Do you have, or are you being treated for high blood pressure? 0  BMI more than 35 kg/m2? 1  Age over 50 years old? 1  Neck circumference greater than 40 cm/16 inches? 0  Gender: 0  Obstructive Sleep Apnea Score 4

## 2015-01-30 NOTE — Progress Notes (Signed)
bp elevated at pre op visit 162/96 and 182/92 ekg ordered with pre op visit today.

## 2015-01-30 NOTE — Progress Notes (Signed)
Dr Landry Dyke made aware of ekg results sinus rhythm 2nd degree av block  mobitz 1, with 2 to 1 av conduction., pt needs cardiac clearance for 5-3- 16 surgery per dr ewell.

## 2015-01-30 NOTE — Progress Notes (Signed)
soke with melissa cross rn aware of abnoraml ekg with pre op today and needs cardiac clearance prior to surgery 02-05-15 per dr ewell.

## 2015-01-31 NOTE — Progress Notes (Addendum)
.   Melissa.  Please send me all cardiac clearance information you receive from [facility] in [place]. Please  fax to [phone] attention Hassaan Crite. thanks

## 2015-02-01 NOTE — Progress Notes (Signed)
Cardiac clearance dr Hamilton Capri on chart with her  lov 01/31/15

## 2015-02-01 NOTE — Progress Notes (Signed)
Requested stress test  And report from dr clevenger.  Received test itself with note dictation will not be available until Monday

## 2015-02-04 MED ORDER — DEXTROSE 5 % IV SOLN
3.0000 g | INTRAVENOUS | Status: AC
  Administered 2015-02-05: 3 g via INTRAVENOUS
  Filled 2015-02-04: qty 3000
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
## 2015-02-05 ENCOUNTER — Other Ambulatory Visit: Payer: Self-pay

## 2015-02-05 ENCOUNTER — Encounter (HOSPITAL_COMMUNITY)
Admission: RE | Disposition: A | Discharge status: Home / Self Care | Payer: Self-pay | Source: Ambulatory Visit | Attending: Gynecologic Oncology

## 2015-02-05 ENCOUNTER — Ambulatory Visit (HOSPITAL_COMMUNITY)
Admission: RE | Admit: 2015-02-05 | Discharge: 2015-02-06 | Disposition: A | Discharge status: Home / Self Care | Payer: 59 | Source: Ambulatory Visit | Attending: Gynecologic Oncology | Admitting: Gynecologic Oncology

## 2015-02-05 ENCOUNTER — Encounter (HOSPITAL_COMMUNITY): Payer: Self-pay | Admitting: *Deleted

## 2015-02-05 ENCOUNTER — Ambulatory Visit (HOSPITAL_COMMUNITY): Payer: 59 | Admitting: Certified Registered"

## 2015-02-05 DIAGNOSIS — N8501 Benign endometrial hyperplasia: Secondary | ICD-10-CM | POA: Diagnosis not present

## 2015-02-05 DIAGNOSIS — N8502 Endometrial intraepithelial neoplasia [EIN]: Secondary | ICD-10-CM | POA: Diagnosis not present

## 2015-02-05 DIAGNOSIS — E669 Obesity, unspecified: Secondary | ICD-10-CM | POA: Insufficient documentation

## 2015-02-05 DIAGNOSIS — Z8542 Personal history of malignant neoplasm of other parts of uterus: Secondary | ICD-10-CM

## 2015-02-05 DIAGNOSIS — G43909 Migraine, unspecified, not intractable, without status migrainosus: Secondary | ICD-10-CM | POA: Insufficient documentation

## 2015-02-05 DIAGNOSIS — Z6838 Body mass index (BMI) 38.0-38.9, adult: Secondary | ICD-10-CM | POA: Insufficient documentation

## 2015-02-05 HISTORY — PX: ROBOTIC ASSISTED TOTAL HYSTERECTOMY WITH BILATERAL SALPINGO OOPHERECTOMY: SHX6086

## 2015-02-05 HISTORY — DX: Atrioventricular block, second degree: I44.1

## 2015-02-05 LAB — TYPE AND SCREEN
ABO/RH(D): O POS
ANTIBODY SCREEN: NEGATIVE

## 2015-02-05 SURGERY — ROBOTIC ASSISTED TOTAL HYSTERECTOMY WITH BILATERAL SALPINGO OOPHORECTOMY
Anesthesia: General | Laterality: Bilateral

## 2015-02-05 MED ORDER — CETYLPYRIDINIUM CHLORIDE 0.05 % MT LIQD
7.0000 mL | Freq: Two times a day (BID) | OROMUCOSAL | Status: DC
  Administered 2015-02-06: 7 mL via OROMUCOSAL

## 2015-02-05 MED ORDER — LABETALOL HCL 5 MG/ML IV SOLN: INTRAVENOUS | Status: DC | PRN

## 2015-02-05 MED ORDER — MIDAZOLAM HCL 2 MG/2ML IJ SOLN
INTRAMUSCULAR | Status: AC
  Filled 2015-02-05: qty 2

## 2015-02-05 MED ORDER — NEOSTIGMINE METHYLSULFATE 10 MG/10ML IV SOLN
INTRAVENOUS | Status: AC
  Filled 2015-02-05: qty 1

## 2015-02-05 MED ORDER — FENTANYL CITRATE (PF) 100 MCG/2ML IJ SOLN
INTRAMUSCULAR | Status: DC | PRN
  Administered 2015-02-05 (×3): 50 ug via INTRAVENOUS

## 2015-02-05 MED ORDER — OXYCODONE-ACETAMINOPHEN 5-325 MG PO TABS
1.0000 | ORAL_TABLET | ORAL | Status: DC | PRN
  Administered 2015-02-05 – 2015-02-06 (×2): 2 via ORAL
  Filled 2015-02-05 (×2): qty 2

## 2015-02-05 MED ORDER — FENTANYL CITRATE (PF) 250 MCG/5ML IJ SOLN
INTRAMUSCULAR | Status: AC
  Filled 2015-02-05: qty 5

## 2015-02-05 MED ORDER — LACTATED RINGERS IR SOLN
Status: DC | PRN
  Administered 2015-02-05: 1000 mL

## 2015-02-05 MED ORDER — STERILE WATER FOR IRRIGATION IR SOLN
Status: DC | PRN
Start: 2015-02-05 — End: 2015-02-05
  Administered 2015-02-05: 3000 mL

## 2015-02-05 MED ORDER — MIDAZOLAM HCL 5 MG/5ML IJ SOLN
INTRAMUSCULAR | Status: DC | PRN
  Administered 2015-02-05: 2 mg via INTRAVENOUS

## 2015-02-05 MED ORDER — ONDANSETRON HCL 4 MG/2ML IJ SOLN: 4.0000 mg | Freq: Four times a day (QID) | INTRAMUSCULAR | Status: DC | PRN

## 2015-02-05 MED ORDER — ONDANSETRON HCL 4 MG/2ML IJ SOLN
INTRAMUSCULAR | Status: DC | PRN
  Administered 2015-02-05: 4 mg via INTRAVENOUS

## 2015-02-05 MED ORDER — LIDOCAINE HCL (CARDIAC) 20 MG/ML IV SOLN
INTRAVENOUS | Status: DC | PRN
  Administered 2015-02-05: 100 mg via INTRAVENOUS

## 2015-02-05 MED ORDER — ROCURONIUM BROMIDE 100 MG/10ML IV SOLN
INTRAVENOUS | Status: DC | PRN
  Administered 2015-02-05: 40 mg via INTRAVENOUS
  Administered 2015-02-05: 10 mg via INTRAVENOUS
  Administered 2015-02-05: 20 mg via INTRAVENOUS

## 2015-02-05 MED ORDER — DEXAMETHASONE SODIUM PHOSPHATE 10 MG/ML IJ SOLN
INTRAMUSCULAR | Status: DC | PRN
  Administered 2015-02-05: 10 mg via INTRAVENOUS

## 2015-02-05 MED ORDER — ROCURONIUM BROMIDE 100 MG/10ML IV SOLN
INTRAVENOUS | Status: AC
  Filled 2015-02-05: qty 1

## 2015-02-05 MED ORDER — KETOROLAC TROMETHAMINE 60 MG/2ML IM SOLN
INTRAMUSCULAR | Status: DC | PRN
  Administered 2015-02-05: 30 mg via INTRAMUSCULAR

## 2015-02-05 MED ORDER — ENOXAPARIN SODIUM 40 MG/0.4ML ~~LOC~~ SOLN
40.0000 mg | SUBCUTANEOUS | Status: DC
  Administered 2015-02-06: 40 mg via SUBCUTANEOUS
  Filled 2015-02-05: qty 0.4

## 2015-02-05 MED ORDER — HYDROMORPHONE HCL 1 MG/ML IJ SOLN: 0.5000 mg | INTRAMUSCULAR | Status: DC | PRN

## 2015-02-05 MED ORDER — GLYCOPYRROLATE 0.2 MG/ML IJ SOLN
INTRAMUSCULAR | Status: AC
  Filled 2015-02-05: qty 1

## 2015-02-05 MED ORDER — LACTATED RINGERS IV SOLN: INTRAVENOUS | Status: DC

## 2015-02-05 MED ORDER — GLYCOPYRROLATE 0.2 MG/ML IJ SOLN
INTRAMUSCULAR | Status: AC
Start: 2015-02-05 — End: 2015-02-05
  Filled 2015-02-05: qty 3

## 2015-02-05 MED ORDER — PROPOFOL 10 MG/ML IV BOLUS
INTRAVENOUS | Status: AC
  Filled 2015-02-05: qty 20

## 2015-02-05 MED ORDER — MEPERIDINE HCL 50 MG/ML IJ SOLN: 6.2500 mg | INTRAMUSCULAR | Status: DC | PRN

## 2015-02-05 MED ORDER — IBUPROFEN 800 MG PO TABS
800.0000 mg | ORAL_TABLET | Freq: Three times a day (TID) | ORAL | Status: DC | PRN
  Administered 2015-02-06: 800 mg via ORAL
  Filled 2015-02-05: qty 1

## 2015-02-05 MED ORDER — ENOXAPARIN SODIUM 40 MG/0.4ML ~~LOC~~ SOLN
40.0000 mg | SUBCUTANEOUS | Status: AC
  Administered 2015-02-05: 40 mg via SUBCUTANEOUS
  Filled 2015-02-05: qty 0.4

## 2015-02-05 MED ORDER — LIDOCAINE HCL (CARDIAC) 20 MG/ML IV SOLN
INTRAVENOUS | Status: AC
  Filled 2015-02-05: qty 5

## 2015-02-05 MED ORDER — PROMETHAZINE HCL 25 MG/ML IJ SOLN: 6.2500 mg | INTRAMUSCULAR | Status: DC | PRN

## 2015-02-05 MED ORDER — DEXAMETHASONE SODIUM PHOSPHATE 10 MG/ML IJ SOLN
INTRAMUSCULAR | Status: AC
  Filled 2015-02-05: qty 1

## 2015-02-05 MED ORDER — ONDANSETRON HCL 4 MG PO TABS: 4.0000 mg | ORAL_TABLET | Freq: Four times a day (QID) | ORAL | Status: DC | PRN

## 2015-02-05 MED ORDER — KETOROLAC TROMETHAMINE 15 MG/ML IJ SOLN: 15.0000 mg | Freq: Four times a day (QID) | INTRAMUSCULAR | Status: DC | PRN

## 2015-02-05 MED ORDER — ONDANSETRON HCL 4 MG/2ML IJ SOLN
INTRAMUSCULAR | Status: AC
  Filled 2015-02-05: qty 2

## 2015-02-05 MED ORDER — LACTATED RINGERS IV SOLN
INTRAVENOUS | Status: DC
  Administered 2015-02-05: 14:00:00 via INTRAVENOUS
  Administered 2015-02-05: 1000 mL via INTRAVENOUS

## 2015-02-05 MED ORDER — PROPOFOL 10 MG/ML IV BOLUS
INTRAVENOUS | Status: DC | PRN
  Administered 2015-02-05: 30 mg via INTRAVENOUS
  Administered 2015-02-05: 190 mg via INTRAVENOUS

## 2015-02-05 MED ORDER — NEOSTIGMINE METHYLSULFATE 10 MG/10ML IV SOLN
INTRAVENOUS | Status: DC | PRN
  Administered 2015-02-05: 4 mg via INTRAVENOUS

## 2015-02-05 MED ORDER — HYDROMORPHONE HCL 1 MG/ML IJ SOLN: 0.2000 mg | INTRAMUSCULAR | Status: DC | PRN

## 2015-02-05 MED ORDER — GLYCOPYRROLATE 0.2 MG/ML IJ SOLN
INTRAMUSCULAR | Status: DC | PRN
Start: 2015-02-05 — End: 2015-02-05
  Administered 2015-02-05: 0.6 mg via INTRAVENOUS
  Administered 2015-02-05 (×2): 0.2 mg via INTRAVENOUS

## 2015-02-05 MED ORDER — PAROXETINE HCL 20 MG PO TABS
20.0000 mg | ORAL_TABLET | Freq: Every day | ORAL | Status: DC
  Filled 2015-02-05: qty 1

## 2015-02-05 MED ORDER — GLYCOPYRROLATE 0.2 MG/ML IJ SOLN
INTRAMUSCULAR | Status: AC
Start: 2015-02-05 — End: 2015-02-05
  Filled 2015-02-05: qty 1

## 2015-02-05 MED ORDER — KCL IN DEXTROSE-NACL 20-5-0.45 MEQ/L-%-% IV SOLN
INTRAVENOUS | Status: DC
  Administered 2015-02-05 – 2015-02-06 (×2): via INTRAVENOUS
  Filled 2015-02-05 (×2): qty 1000

## 2015-02-05 MED ORDER — FENTANYL CITRATE (PF) 100 MCG/2ML IJ SOLN: 25.0000 ug | INTRAMUSCULAR | Status: DC | PRN

## 2015-02-05 SURGICAL SUPPLY — 44 items
CABLE HIGH FREQUENCY MONO STRZ (ELECTRODE) ×2 IMPLANT
CHLORAPREP W/TINT 26ML (MISCELLANEOUS) ×2 IMPLANT
CORDS BIPOLAR (ELECTRODE) ×2 IMPLANT
COVER SURGICAL LIGHT HANDLE (MISCELLANEOUS) ×2 IMPLANT
COVER TIP SHEARS 8 DVNC (MISCELLANEOUS) ×1 IMPLANT
COVER TIP SHEARS 8MM DA VINCI (MISCELLANEOUS) ×1
DRAPE SHEET LG 3/4 BI-LAMINATE (DRAPES) ×4 IMPLANT
DRAPE SURG IRRIG POUCH 19X23 (DRAPES) ×2 IMPLANT
DRAPE TABLE BACK 44X90 PK DISP (DRAPES) ×4 IMPLANT
DRAPE WARM FLUID 44X44 (DRAPE) ×2 IMPLANT
DRSG TEGADERM 6X8 (GAUZE/BANDAGES/DRESSINGS) IMPLANT
ELECT REM PT RETURN 9FT ADLT (ELECTROSURGICAL) ×2
ELECTRODE REM PT RTRN 9FT ADLT (ELECTROSURGICAL) ×1 IMPLANT
GLOVE BIO SURGEON STRL SZ 6 (GLOVE) ×6 IMPLANT
GLOVE BIO SURGEON STRL SZ 6.5 (GLOVE) ×4 IMPLANT
GOWN STRL REUS W/ TWL LRG LVL3 (GOWN DISPOSABLE) ×3 IMPLANT
GOWN STRL REUS W/TWL LRG LVL3 (GOWN DISPOSABLE) ×6
HOLDER FOLEY CATH W/STRAP (MISCELLANEOUS) ×2 IMPLANT
KIT ACCESSORY DA VINCI DISP (KITS) ×1
KIT ACCESSORY DVNC DISP (KITS) ×1 IMPLANT
KIT BASIN OR (CUSTOM PROCEDURE TRAY) ×2 IMPLANT
LIQUID BAND (GAUZE/BANDAGES/DRESSINGS) ×2 IMPLANT
MANIPULATOR UTERINE 4.5 ZUMI (MISCELLANEOUS) ×2 IMPLANT
OCCLUDER COLPOPNEUMO (BALLOONS) ×2 IMPLANT
PEN SKIN MARKING BROAD (MISCELLANEOUS) ×2 IMPLANT
POUCH SPECIMEN RETRIEVAL 10MM (ENDOMECHANICALS) IMPLANT
SET TUBE IRRIG SUCTION NO TIP (IRRIGATION / IRRIGATOR) ×2 IMPLANT
SHEET LAVH (DRAPES) ×2 IMPLANT
SOLUTION ELECTROLUBE (MISCELLANEOUS) ×2 IMPLANT
SUT VIC AB 0 CT1 27 (SUTURE) ×2
SUT VIC AB 0 CT1 27XBRD ANTBC (SUTURE) ×1 IMPLANT
SUT VIC AB 4-0 PS2 27 (SUTURE) ×4 IMPLANT
SUT VICRYL 0 UR6 27IN ABS (SUTURE) IMPLANT
SYR 50ML LL SCALE MARK (SYRINGE) ×2 IMPLANT
TOWEL OR 17X26 10 PK STRL BLUE (TOWEL DISPOSABLE) ×4 IMPLANT
TOWEL OR NON WOVEN STRL DISP B (DISPOSABLE) ×2 IMPLANT
TRAP SPECIMEN MUCOUS 40CC (MISCELLANEOUS) ×2 IMPLANT
TRAY FOLEY W/METER SILVER 14FR (SET/KITS/TRAYS/PACK) ×2 IMPLANT
TRAY LAPAROSCOPIC (CUSTOM PROCEDURE TRAY) ×2 IMPLANT
TROCAR 12M 150ML BLUNT (TROCAR) ×2 IMPLANT
TROCAR BLADELESS OPT 5 100 (ENDOMECHANICALS) ×2 IMPLANT
TROCAR XCEL 12X100 BLDLESS (ENDOMECHANICALS) ×2 IMPLANT
TUBING INSUFFLATION 10FT LAP (TUBING) ×2 IMPLANT
WATER STERILE IRR 1500ML POUR (IV SOLUTION) IMPLANT

## 2015-02-05 NOTE — Progress Notes (Signed)
12 Lead EKG done.

## 2015-02-05 NOTE — Progress Notes (Signed)
Dr. Marcell Barlow in- had talked with Dr. Denman George- Dr. Denman George to call for Cardiology consult- O.K.  To go on to Telemetry - per Dr. Marcell Barlow

## 2015-02-05 NOTE — Progress Notes (Signed)
Dr. Marcell Barlow in - saw EKG copy - to talk with Dr. Denman George.

## 2015-02-05 NOTE — Progress Notes (Signed)
Dr. Denman George and Delsa Sale made aware of patient's heart rates and EKG monitor readings

## 2015-02-05 NOTE — Anesthesia Preprocedure Evaluation (Addendum)
Anesthesia Evaluation  Patient identified by MRN, date of birth, ID band Patient awake    Reviewed: Allergy & Precautions, NPO status , Patient's Chart, lab work & pertinent test results  Airway Mallampati: II  TM Distance: >3 FB Neck ROM: Full    Dental no notable dental hx.    Pulmonary neg pulmonary ROS,  breath sounds clear to auscultation  Pulmonary exam normal       Cardiovascular negative cardio ROS  Rhythm:Regular Rate:Normal     Neuro/Psych negative neurological ROS  negative psych ROS   GI/Hepatic negative GI ROS, Neg liver ROS,   Endo/Other  negative endocrine ROS  Renal/GU negative Renal ROS  negative genitourinary   Musculoskeletal negative musculoskeletal ROS (+)   Abdominal   Peds negative pediatric ROS (+)  Hematology negative hematology ROS (+)   Anesthesia Other Findings   Reproductive/Obstetrics negative OB ROS                             Anesthesia Physical Anesthesia Plan  ASA: II  Anesthesia Plan: General   Post-op Pain Management:    Induction: Intravenous  Airway Management Planned: Oral ETT  Additional Equipment:   Intra-op Plan:   Post-operative Plan: Extubation in OR  Informed Consent: I have reviewed the patients History and Physical, chart, labs and discussed the procedure including the risks, benefits and alternatives for the proposed anesthesia with the patient or authorized representative who has indicated his/her understanding and acceptance.   Dental advisory given  Plan Discussed with: CRNA  Anesthesia Plan Comments:         Anesthesia Quick Evaluation

## 2015-02-05 NOTE — Anesthesia Procedure Notes (Signed)
Procedure Name: Intubation Date/Time: 02/05/2015 11:33 AM Performed by: Carleene Cooper A Pre-anesthesia Checklist: Patient identified, Emergency Drugs available, Suction available, Timeout performed and Patient being monitored Patient Re-evaluated:Patient Re-evaluated prior to inductionOxygen Delivery Method: Circle system utilized Preoxygenation: Pre-oxygenation with 100% oxygen Intubation Type: IV induction Ventilation: Mask ventilation without difficulty Laryngoscope Size: Mac and 4 Grade View: Grade I Tube type: Oral Tube size: 7.5 mm Number of attempts: 1 Airway Equipment and Method: Stylet Placement Confirmation: ETT inserted through vocal cords under direct vision,  positive ETCO2 and breath sounds checked- equal and bilateral Secured at: 21 cm Tube secured with: Tape Dental Injury: Teeth and Oropharynx as per pre-operative assessment

## 2015-02-05 NOTE — Transfer of Care (Signed)
Immediate Anesthesia Transfer of Care Note  Patient: Laurie Mckinney  Procedure(s) Performed: Procedure(s): ROBOTIC ASSISTED TOTAL LAPAROSCOPIC HYSTERECTOMY WITH BILATERAL SALPINGO OOPHORECTOMY (Bilateral)  Patient Location: PACU  Anesthesia Type:General  Level of Consciousness: alert , oriented and patient cooperative  Airway & Oxygen Therapy: Patient Spontanous Breathing and Patient connected to face mask oxygen  Post-op Assessment: Report given to RN, Post -op Vital signs reviewed and stable and Patient moving all extremities X 4  Post vital signs: stable  Last Vitals:  Filed Vitals:   02/05/15 0951  BP: 149/82  Pulse: 71  Temp: 36.7 C  Resp: 18    Complications: No apparent anesthesia complications

## 2015-02-05 NOTE — H&P (Signed)
CC:  Chief Complaint  Patient presents with  . complex endometrial hyperplasia with atypia    Assessment/Plan:  Ms. KARRAH MANGINI is a 54 y.o. year old complex endometrial hyperplasia with atypia.  I discussed both medical (progestin) and surgical (hysterectomy) options. I recommend total hysterectomy, BSO given her age and its definitive treatment of the condition, as well as its ability to identify occult co-existent cancer which is present 30% of the time. I discussed that we would send the uterine specimen for frozen section and if invasive cancer was identified that was >2cm, deeply invasive or grade 2 or 3, we would perform lymphadenectomy.  I discussed that I recommend the robotic approach for her given her body habitus. I discussed operative risks including bleeding, infection, damage to internal organs (such as bladder,ureters, bowels), blood clot, reoperation and rehospitalization. I discussed that there are risks associated with steep trendelenberg and positioning of the arms, particularly neurologic risks. I discussed risks of lymphedema if lymph node dissection is appropriate.  She is electing to proceed with robotic assisted TLH, BSO, possible lymphadenectomy.    HPI: Ramani Riva is a 54 year old gravida 2 para 2 who is seen in consultation at the request of Dr. Ulanda Edison for complex endometrial hyperplasia with atypia. The patient has singular episode of postmenopausal bleeding approximate one month ago. This is now completely resolved. She been amenorrheic for a proximally 18 months prior to this and during her menstrual history had regular monthly menses. As part of workup of her postmenopausal bleeding her gynecologist Dr. Lannette Donath performed an endometrial biopsy on 12/21/2014. This revealed complex endometrial hyperplasia with atypia.   She has had no prior abdominal surgeries and 2 vaginal deliveries.  Interval History: no further vaginal bleeding  Current Meds:   Outpatient Encounter Prescriptions as of 01/07/2015  Medication Sig  . acetaminophen (TYLENOL ARTHRITIS PAIN) 650 MG CR tablet Take 650 mg by mouth every 8 (eight) hours as needed for pain.  . Multiple Vitamins-Minerals (MULTIVITAMIN WITH MINERALS) tablet Take 1 tablet by mouth daily.   Marland Kitchen PARoxetine (PAXIL) 20 MG tablet TAKE 1 TABLET BY MOUTH EVERY DAY  . Pyridoxine HCl (VITAMIN B-6) 500 MG tablet Take 500 mg by mouth daily.  . [DISCONTINUED] PARoxetine (PAXIL) 20 MG tablet TAKE 1 TABLET BY MOUTH EVERY DAY  . [DISCONTINUED] solifenacin (VESICARE) 5 MG tablet Take 1 tablet (5 mg total) by mouth daily.    Allergy: No Known Allergies  Social Hx:  History   Social History  . Marital Status: Married    Spouse Name: N/A  . Number of Children: N/A  . Years of Education: N/A   Occupational History  . Not on file.   Social History Main Topics  . Smoking status: Never Smoker   . Smokeless tobacco: Never Used  . Alcohol Use: No  . Drug Use: No  . Sexual Activity: Yes   Other Topics Concern  . Not on file   Social History Narrative    Past Surgical Hx:  Past Surgical History  Procedure Laterality Date  . Colonoscopy  2011    Dr.Brodie    Past Medical Hx:  Past Medical History  Diagnosis Date  . Migraine headache   . Allergy     RHINITIS  . Obesity   . Adjustment insomnia     SHIFT WORK    Past Gynecological History: SVD x 2 Patient's last menstrual period was 01/26/2011.  Family Hx:  Family History  Problem Relation Age of Onset  .  Arthritis Mother   . Heart disease Father     Review of Systems:  Constitutional  Feels well,  ENT Normal appearing ears and nares bilaterally Skin/Breast  No rash, sores, jaundice, itching, dryness Cardiovascular  No chest pain, shortness of breath, or edema  Pulmonary  No cough or wheeze.   Gastro Intestinal  No nausea, vomitting, or diarrhoea. No bright red blood per rectum, no abdominal pain, change in bowel movement, or constipation.  Genito Urinary  No frequency, urgency, dysuria, see HPI Musculo Skeletal  No myalgia, arthralgia, joint swelling or pain  Neurologic  No weakness, numbness, change in gait,  Psychology  No depression, anxiety, insomnia.   Vitals: Blood pressure 168/85, pulse 70, temperature 98.3 F (36.8 C), temperature source Oral, resp. rate 18, height 5' 10.5" (1.791 m), weight 271 lb (122.925 kg), last menstrual period 01/26/2011.  Physical Exam: WD in NAD Neck  Supple NROM, without any enlargements.  Lymph Node Survey No cervical supraclavicular or inguinal adenopathy Cardiovascular  Pulse normal rate, regularity and rhythm. S1 and S2 normal.  Lungs  Clear to auscultation bilateraly, without wheezes/crackles/rhonchi. Good air movement.  Skin  No rash/lesions/breakdown  Psychiatry  Alert and oriented to person, place, and time  Abdomen  Normoactive bowel sounds, abdomen soft, non-tender and obese without evidence of hernia.  Back No CVA tenderness Genito Urinary  Vulva/vagina: Normal external female genitalia. No lesions. No discharge or bleeding. Bladder/urethra: No lesions or masses, well supported bladder Vagina: no lesions Cervix: Normal appearing, no lesions. Uterus: Small, mobile, no parametrial involvement or nodularity. Adnexa: no palpable masses. Rectal  Good tone, no masses no cul de sac nodularity.  Extremities  No bilateral cyanosis, clubbing or edema.   Donaciano Eva, MD

## 2015-02-05 NOTE — Op Note (Signed)
OPERATIVE NOTE 02/05/2015  Surgeon: Donaciano Eva   Assistants: Lahoma Crocker (an MD assistant was necessary for tissue manipulation, management of robotic instrumentation, retraction and positioning due to the complexity of the case and hospital policies).   Anesthesia: General endotracheal anesthesia  ASA Class: 3   Pre-operative Diagnosis: complex endometrial hyperplasia with atypia  Post-operative Diagnosis: same  Operation: Robotic-assisted laparoscopic hysterectomy with bilateral salpingoophorectomy   Surgeon: Donaciano Eva  Assistant Surgeon: Lahoma Crocker MD  Anesthesia: GET  Urine Output: 100  Operative Findings:  : normal appearing uterus, tubes and ovaries. Frozen section revealed a 3cm area of hyperplasia with a few focal areas of non-invasive adenocarcinoma. Likely stage IA, focal.  Estimated Blood Loss:  Minimal      Total IV Fluids: 1,000 ml         Specimens: uterus, cervix, bilateral tubes and ovaries.         Complications:  None; patient tolerated the procedure well.         Disposition: PACU - hemodynamically stable.  Procedure Details  The patient was seen in the Holding Room. The risks, benefits, complications, treatment options, and expected outcomes were discussed with the patient.  The patient concurred with the proposed plan, giving informed consent.  The site of surgery properly noted/marked. The patient was identified as Laurie Mckinney and the procedure verified as a Robotic-assisted hysterectomy with bilateral salpingo oophorectomy. A Time Out was held and the above information confirmed.  After induction of anesthesia, the patient was draped and prepped in the usual sterile manner. Pt was placed in supine position after anesthesia and draped and prepped in the usual sterile manner. The abdominal drape was placed after the CholoraPrep had been allowed to dry for 3 minutes.  Her arms were tucked to her side with all  appropriate precautions.  The chest was secured to the table.  The patient was placed in the semi-lithotomy position in [place].  The perineum was prepped with Betadine.  Foley catheter was placed.  A sterile speculum was placed in the vagina.  The cervix was grasped with a single-tooth tenaculum and dilated with Kennon Rounds dilators.  The ZUMI uterine manipulator with a medium colpotomizer ring was placed without difficulty.  A pneum occluder balloon was placed over the manipulator.  A second time-out was performed.  OG tube placement was confirmed and to suction.    Procedure:  The patient was brought to the operating room where general anesthesia was administered with no complications.  The patient was placed in the dorsal lithotomy position in padded Allen stirrups.  The arms were tucked at the sides with gel pads protecting the elbows and foam protecting the hands. The patient was then prepped.  A Foley was placed to gravity.  A medium size KOH ring was used to place around the cervix after the cervix had been dilated and then a RUMI manipulator was attached in the normal manner.  The patient was then draped in the normal manner.  Next, a 5 mm skin incision was made 1 cm below the subcostal margin in the midclavicular line.  The 5 mm Optiview port and scope was used for direct entry.  Opening pressure was under 10 mm CO2.  The abdomen was insufflated and the findings were noted as above.   At this point and all points during the procedure, the patient's intra-abdominal pressure did not exceed 15 mmHg. Next, a 10 mm skin incision was made 2cm above the umbilicus and  a right and left port was placed about 10 cm lateral to the robot port on the right and left side.  All ports were placed under direct visualization.  The patient was placed in steep Trendelenburg.  Bowel was away into the upper abdomen.  The robot was docked in the normal manner.  The hysterectomy was started after the round ligament on the  right side was incised and the retroperitoneum was entered and the pararectal space was developed.  The ureter was noted to be on the medial leaf of the broad ligament.  The peritoneum above the ureter was incised and stretched and the infundibulopelvic ligament was skeletonized, cauterized and cut.  The posterior peritoneum was taken down to the level of the KOH ring.  The anterior peritoneum was also taken down.  The bladder flap was created to the level of the KOH ring.  The uterine artery on the right side was skeletonized, cauterized and cut in the normal manner.  A similar procedure was performed on the left.  The colpotomy was made and the uterus, cervix, bilateral ovaries and tubes were amputated and delivered through the vagina.  Pedicles were inspected and excellent hemostasis was achieved.    The frozen section returned with possible well differentiated adenocarcinoma of the endometrium with "Mayo" low risk features (focal, <2cm, inner half invasion).  The colpotomy at the vaginal cuff was closed with Vicryl on a CT1 needle in a running manner.  Irrigation was used and excellent hemostasis was achieved.  At this point in the procedure was completed.  Robotic instruments were removed under direct visulaization.  The robot was undocked. The 10 mm ports were closed with Vicryl on a UR-5 needle and the fascia was closed with 0 Vicryl on a UR-5 needle.  The skin was closed with 4-0 Vicryl in a subcuticular manner.  Dermabond was applied.  Sponge, lap and needle counts correct x 2.  The patient was taken to the recovery room in stable condition.  The vagina was swabbed with  minimal bleeding noted.   All instrument and needle counts were correct x  3.   The patient was transferred to the recovery room in stable condition.   Donaciano Eva, MD

## 2015-02-05 NOTE — Progress Notes (Signed)
Dr. Marcell Barlow made aware of patient's heart rates and blood pressures- also EKG monitor readings- Sinus bradycardia with 2nd degree AV block- Mobitz 1- also made aware CRNA gave Robinul 0.2mg  IVP  In PACU- to see patient.

## 2015-02-05 NOTE — Anesthesia Postprocedure Evaluation (Signed)
  Anesthesia Post-op Note  Patient: Laurie Mckinney  Procedure(s) Performed: Procedure(s) (LRB): ROBOTIC ASSISTED TOTAL LAPAROSCOPIC HYSTERECTOMY WITH BILATERAL SALPINGO OOPHORECTOMY (Bilateral)  Patient Location: PACU  Anesthesia Type: General  Level of Consciousness: awake and alert   Airway and Oxygen Therapy: Patient Spontanous Breathing  Post-op Pain: mild  Post-op Assessment: Post-op Vital signs reviewed, Patient's Cardiovascular Status Stable, Respiratory Function Stable, Patent Airway and No signs of Nausea or vomiting  Last Vitals:  Filed Vitals:   02/05/15 1445  BP: 131/76  Pulse: 39  Temp:   Resp: 19    Post-op Vital Signs: stable   Complications: No apparent anesthesia complications.  Bradycardia. HR high 30's. Stable. Holding BP. Asyptomatic. EKG shows Mobitz 1. Monitored bed. Dr. Denman George aware

## 2015-02-05 NOTE — Progress Notes (Signed)
Dr. Marcell Barlow in to see patient-orders given

## 2015-02-06 ENCOUNTER — Encounter (HOSPITAL_COMMUNITY): Payer: Self-pay | Admitting: Gynecologic Oncology

## 2015-02-06 DIAGNOSIS — N8502 Endometrial intraepithelial neoplasia [EIN]: Secondary | ICD-10-CM | POA: Diagnosis not present

## 2015-02-06 LAB — BASIC METABOLIC PANEL
ANION GAP: 8 (ref 5–15)
BUN: 10 mg/dL (ref 6–20)
CALCIUM: 8.8 mg/dL — AB (ref 8.9–10.3)
CO2: 27 mmol/L (ref 22–32)
CREATININE: 0.69 mg/dL (ref 0.44–1.00)
Chloride: 104 mmol/L (ref 101–111)
GFR calc non Af Amer: >60 mL/min (ref >60)
GLUCOSE: 138 mg/dL — AB (ref 70–99)
POTASSIUM: 4.2 mmol/L (ref 3.5–5.1)
Sodium: 139 mmol/L (ref 135–145)

## 2015-02-06 LAB — CBC
HCT: 40 % (ref 36.0–46.0)
HEMOGLOBIN: 13 g/dL (ref 12.0–15.0)
MCH: 30.2 pg (ref 26.0–34.0)
MCHC: 32.5 g/dL (ref 30.0–36.0)
MCV: 92.8 fL (ref 78.0–100.0)
Platelets: 273 10*3/uL (ref 150–400)
RBC: 4.31 MIL/uL (ref 3.87–5.11)
RDW: 12.8 % (ref 11.5–15.5)
WBC: 12.1 10*3/uL — ABNORMAL HIGH (ref 4.0–10.5)

## 2015-02-06 MED ORDER — OXYCODONE-ACETAMINOPHEN 5-325 MG PO TABS: 1.0000 | ORAL_TABLET | ORAL | Status: DC | PRN

## 2015-02-06 NOTE — Discharge Instructions (Signed)
02/05/2015  Return to work: 4-6 weeks  Activity: 1. Be up and out of the bed during the day.  Take a nap if needed.  You may walk up steps but be careful and use the hand rail.  Stair climbing will tire you more than you think, you may need to stop part way and rest.   2. No lifting or straining for 6 weeks.  3. No driving for 1-2 weeks.  Do Not drive if you are taking narcotic pain medicine.  4. Shower daily.  Use soap and water on your incision and pat dry; don't rub.   5. No sexual activity and nothing in the vagina for 8 weeks.  Diet: 1. Low sodium Heart Healthy Diet is recommended.  2. It is safe to use a laxative if you have difficulty moving your bowels.   Wound Care: 1. Keep clean and dry.  Shower daily.  Reasons to call the Doctor:   Fever - Oral temperature greater than 100.4 degrees Fahrenheit  Foul-smelling vaginal discharge  Difficulty urinating  Nausea and vomiting  Increased pain at the site of the incision that is unrelieved with pain medicine.  Difficulty breathing with or without chest pain  New calf pain especially if only on one side  Sudden, continuing increased vaginal bleeding with or without clots.   Follow-up: 1. See Everitt Amber in 4 weeks.  Contacts: For questions or concerns you should contact:  Dr. Everitt Amber at [phone]  or at [place]  Abdominal Hysterectomy, Care After These instructions give you information on caring for yourself after your procedure. Your doctor may also give you more specific instructions. Call your doctor if you have any problems or questions after your procedure.  HOME CARE It takes 4-6 weeks to recover from this surgery. Follow all of your doctor's instructions.   Only take medicines as told by your doctor.  Change your bandage as told by your doctor.  Return to your doctor to have your stitches taken out.  Take showers for 2-3 weeks. Ask your doctor when it is okay to shower.  Do not  douche, use tampons, or have sex (intercourse) for at least 6 weeks or as told.  Follow your doctor's advice about exercise, lifting objects, driving, and general activities.  Get plenty of rest and sleep.  Do not lift anything heavier than a gallon of milk (about 10 pounds [4.5 kilograms]) for the first month after surgery.  Get back to your normal diet as told by your doctor.  Do not drink alcohol until your doctor says it is okay.  Take a medicine to help you poop (laxative) as told by your doctor.  Eating foods high in fiber may help you poop. Eat a lot of raw fruits and vegetables, whole grains, and beans.  Drink enough fluids to keep your pee (urine) clear or pale yellow.  Have someone help you at home for 1-2 weeks after your surgery.  Keep follow-up doctor visits as told. GET HELP IF:  You have chills or fever.  You have puffiness, redness, or pain in area of the cut (incision).  You have yellowish-white fluid (pus) coming from the cut.  You have a bad smell coming from the cut or bandage.  Your cut pulls apart.  You feel dizzy or light-headed.  You have pain or bleeding when you pee.  You keep having watery poop (diarrhea).  You keep feeling sick to your stomach (nauseous) or keep throwing up (vomiting).  You  have fluid (discharge) coming from your vagina.  You have a rash.  You have a reaction to your medicine.  You need stronger pain medicine. GET HELP RIGHT AWAY IF:   You have a fever and your symptoms suddenly get worse.  You have bad belly (abdominal) pain.  You have chest pain.  You are short of breath.  You pass out (faint).  You have pain, puffiness, or redness of your leg.  You bleed a lot from your vagina and notice clumps of tissue (clots). MAKE SURE YOU:   Understand these instructions.  Will watch your condition.  Will get help right away if you are not doing well or get worse. Document Released: 06/30/2008 Document Revised:  09/26/2013 Document Reviewed: 07/14/2013 [facility] Patient Information 2015 [place], [place]. This information is not intended to replace advice given to you by your health care provider. Make sure you discuss any questions you have with your health care provider.

## 2015-02-06 NOTE — Discharge Summary (Signed)
Physician Discharge Summary  Patient ID: Laurie Mckinney MRN: [id number] DOB/AGE: 1961-01-04 54 y.o.  Admit date: 02/05/2015 Discharge date: 02/06/2015  Admission Diagnoses: Endometrial hyperplasia with atypia  Discharge Diagnoses:  Principal Problem:   Endometrial hyperplasia with atypia Active Problems:   Endometrial hyperplasia   Discharged Condition:  The patient is in good condition and stable for discharge.    Hospital Course: On 02/05/2015, the patient underwent the following: Procedure(s): ROBOTIC ASSISTED TOTAL LAPAROSCOPIC HYSTERECTOMY WITH BILATERAL SALPINGO OOPHORECTOMY.  The postoperative course was uneventful except for bradycardia post-operatively with HR in the high 30s.  BP stable and patient asymptomatic at that time.  She was discharged to home on postoperative day 1 tolerating a regular diet, voiding without difficulty.  Consults: None  Significant Diagnostic Studies: None  Treatments: surgery: see above  Discharge Exam: Blood pressure 157/71, pulse 74, temperature 98.1 F (36.7 C), temperature source Oral, resp. rate 14, height 5\' 11"  (1.803 m), weight 273 lb 12.8 oz (124.195 kg), last menstrual period 01/26/2011, SpO2 98 %. General appearance: alert, cooperative and no distress Resp: clear to auscultation bilaterally Cardio: sinus rhythm at 71 bpm, heart block present GI: soft, non-tender; bowel sounds normal; no masses,  no organomegaly and abdomen obese Extremities: extremities normal, atraumatic, no cyanosis or edema Incision/Wound: lap sites to the abdomen with dermabond without erythema or drainage Mild subconjunctival hemorrhage noted with the right eye to the lateral aspect.  No visual changes.  Asymptomatic.   Disposition: Home  Discharge Instructions    Call MD for:  difficulty breathing, headache or visual disturbances    Complete by:  As directed      Call MD for:  extreme fatigue    Complete by:  As directed      Call MD for:  hives     Complete by:  As directed      Call MD for:  persistant dizziness or light-headedness    Complete by:  As directed      Call MD for:  persistant nausea and vomiting    Complete by:  As directed      Call MD for:  redness, tenderness, or signs of infection (pain, swelling, redness, odor or green/yellow discharge around incision site)    Complete by:  As directed      Call MD for:  severe uncontrolled pain    Complete by:  As directed      Call MD for:  temperature >100.4    Complete by:  As directed      Diet - low sodium heart healthy    Complete by:  As directed      Driving Restrictions    Complete by:  As directed   No driving for 1 week.  Do not take narcotics and drive.     Increase activity slowly    Complete by:  As directed      Lifting restrictions    Complete by:  As directed   No lifting greater than 10 lbs.     Sexual Activity Restrictions    Complete by:  As directed   No sexual activity, nothing in the vagina, for 8 weeks.            Medication List    TAKE these medications        AZO BLADDER CONTROL/GO-LESS PO  Take 1 tablet by mouth 2 (two) times daily.     multivitamin with minerals tablet  Take 1 tablet by mouth every morning.  oxyCODONE-acetaminophen 5-325 MG per tablet  Commonly known as:  PERCOCET/ROXICET  Take 1-2 tablets by mouth every 4 (four) hours as needed for severe pain (moderate to severe pain).     PARoxetine 20 MG tablet  Commonly known as:  PAXIL  TAKE 1 TABLET BY MOUTH EVERY DAY     TYLENOL ARTHRITIS PAIN 650 MG CR tablet  Generic drug:  acetaminophen  Take 650 mg by mouth every 8 (eight) hours as needed for pain.           Follow-up Information    Follow up with Donaciano Eva, MD On 03/08/2015.   Specialty:  Obstetrics and Gynecology   Why:  at 10:30am at the [facility] information:   [address] [address] [phone]       Please follow up.   Why:  Cardiology in [place] within the  next week      Greater than thirty minutes were spend for face to face discharge instructions and discharge orders/summary in EPIC.   Signed: Anabel Lykins DEAL 02/06/2015, 11:42 AM

## 2015-02-06 NOTE — Progress Notes (Signed)
Pt leaving at this time. Alert, oriented, and without c/o. Discharge instructions/prescription given/explained with pt verbalizing understanding.  Followup appointment noted.

## 2015-02-08 ENCOUNTER — Telehealth: Payer: Self-pay | Admitting: Gynecologic Oncology

## 2015-02-08 NOTE — Telephone Encounter (Signed)
Informed patient of stage IA grade 1 endometrial cancer. Adjuvant therapy not recommended. Plan for close followup with 6 monthly evaluations. Donaciano Eva

## 2015-02-26 DIAGNOSIS — I4892 Unspecified atrial flutter: Secondary | ICD-10-CM

## 2015-02-26 HISTORY — DX: Unspecified atrial flutter: I48.92

## 2015-03-05 ENCOUNTER — Encounter: Payer: Self-pay | Admitting: Internal Medicine

## 2015-03-05 ENCOUNTER — Encounter: Payer: Self-pay | Admitting: Family Medicine

## 2015-03-08 ENCOUNTER — Encounter: Payer: Self-pay | Admitting: Gynecologic Oncology

## 2015-03-08 ENCOUNTER — Ambulatory Visit: Payer: 59 | Attending: Gynecologic Oncology | Admitting: Gynecologic Oncology

## 2015-03-08 VITALS — BP 166/86 | HR 70 | Temp 98.0°F | Resp 20 | Ht 71.0 in | Wt 269.9 lb

## 2015-03-08 DIAGNOSIS — Z483 Aftercare following surgery for neoplasm: Secondary | ICD-10-CM | POA: Insufficient documentation

## 2015-03-08 DIAGNOSIS — Z90722 Acquired absence of ovaries, bilateral: Secondary | ICD-10-CM | POA: Diagnosis not present

## 2015-03-08 DIAGNOSIS — C541 Malignant neoplasm of endometrium: Secondary | ICD-10-CM

## 2015-03-08 DIAGNOSIS — Z8542 Personal history of malignant neoplasm of other parts of uterus: Secondary | ICD-10-CM | POA: Diagnosis not present

## 2015-03-08 DIAGNOSIS — N3946 Mixed incontinence: Secondary | ICD-10-CM | POA: Diagnosis not present

## 2015-03-08 DIAGNOSIS — Z90710 Acquired absence of both cervix and uterus: Secondary | ICD-10-CM | POA: Diagnosis not present

## 2015-03-08 NOTE — Patient Instructions (Signed)
Follwup with Dr. Denman George in 6 months as scheduled. Please call our office prior to this appt with any questions or concerns.   We will send a referral to Dr. Matilde Sprang at [facility]. The office number is [phone].

## 2015-03-08 NOTE — Progress Notes (Signed)
POSTOPERATIVE VISIT ENDOMETRIAL CANCER  HPI:  Laurie Mckinney is a 54 y.o. year old initially seen in consultation on 01/07/15 for complex atypical hyperplasia, referred by Dr Ulanda Edison.  She then underwent a robotic hysterectomy, BSO on 01/12/43 without complications.  Her postoperative course was uncomplicated. An occult endometrial cancer was identified on final pathology.  Her final pathologic diagnosis is a Stage IA Grade 1 endometrioid endometrial cancer with no lymphovascular space invasion, 2/22 mm (10%) of myometrial invasion. Lymph nodes were not sampled.  She is seen today for a postoperative check and to discuss her pathology results and treatment plan.  Since discharge from the hospital, she is feeling well.  She has improving appetite, normal bowel and bladder function, and pain controlled with minimal PO medication. She reports persistent mixed stress and urge incontinence (present prior to surgery) and desires referral to a urogynecologist/urologist.    Review of systems: Constitutional:  She has no weight gain or weight loss. She has no fever or chills. Eyes: No blurred vision Ears, Nose, Mouth, Throat: No dizziness, headaches or changes in hearing. No mouth sores. Cardiovascular: No chest pain, palpitations or edema. Respiratory:  No shortness of breath, wheezing or cough Gastrointestinal: She has normal bowel movements without diarrhea or constipation. She denies any nausea or vomiting. She denies blood in her stool or heart burn. Genitourinary:  She denies pelvic pain, pelvic pressure. She has no hematuria, dysuria, or incontinence. She has no irregular vaginal bleeding or vaginal discharge. + urinary incontinence. Musculoskeletal: Denies muscle weakness or joint pains.  Skin:  She has no skin changes, rashes or itching Neurological:  Denies dizziness or headaches. No neuropathy, no numbness or tingling. Psychiatric:  She denies depression or anxiety. Hematologic/Lymphatic:   No  easy bruising or bleeding   Physical Exam: Blood pressure 166/86, pulse 70, temperature 98 F (36.7 C), temperature source Oral, resp. rate 20, height 5\' 11"  (1.803 m), weight 269 lb 14.4 oz (122.426 kg), last menstrual period 01/26/2011. General: Well dressed, well nourished in no apparent distress.   HEENT:  Normocephalic and atraumatic, no lesions.  Extraocular muscles intact. Sclerae anicteric. Pupils equal, round, reactive. No mouth sores or ulcers. Thyroid is normal size, not nodular, midline. Skin:  No lesions or rashes. Breasts:  deferred Lungs: deferred Cardiovascular:  deferred Abdomen:  Soft, nontender, nondistended.  No palpable masses.  No hepatosplenomegaly.  No ascites. Normal bowel sounds.  No hernias.  Incisions are well healed Genitourinary: Normal EGBUS  Vaginal cuff intact.  No bleeding or discharge.  No cul de sac fullness. Extremities: No cyanosis, clubbing or edema.  No calf tenderness or erythema. No palpable cords. Psychiatric: Mood and affect are appropriate. Neurological: Awake, alert and oriented x 3. Sensation is intact, no neuropathy.  Musculoskeletal: No pain, normal strength and range of motion.  Assessment:    54 y.o. year old with Stage IA Grade 1 endometrioid endometrial cancer.   S/p robotic hysterectomy, BSO on 02/05/15. no LVSI, 10% myometrial invasion, negative pelvic washings and lymph nodes not sampled.   Plan: 1) Pathology reports reviewed today 2) Treatment counseling - Very low risk (<5%) for recurrence given age, grade, depth of myometrial invasion and LVSI status. Multidisciplinary tumor board recommendation is for routine surveillance with frequent pelvic exams and visits with annual pap smear.  We will start with visits every 6 months x 5 years, at which time she can return to annual visits.  Discussed signs and symptoms of recurrence including vaginal bleeding or discharge, leg pain  or swelling and changes in bowel or bladder habits. She was  given the opportunity to ask questions, which were answered to her satisfaction, and she is agreement with the above mentioned plan of care. 3) referral to Dr Matilde Sprang from [facility] for symptoms of stress and urge incontinence that limit the quality of her life. 3)  Return to clinic in 6 months, alternating with Dr Ulanda Edison for surveillance visits.   Donaciano Eva, MD

## 2015-03-28 ENCOUNTER — Other Ambulatory Visit: Payer: Self-pay | Admitting: Gynecologic Oncology

## 2015-03-28 DIAGNOSIS — N3941 Urge incontinence: Secondary | ICD-10-CM

## 2015-04-11 ENCOUNTER — Ambulatory Visit (INDEPENDENT_AMBULATORY_CARE_PROVIDER_SITE_OTHER): Payer: 59 | Admitting: Family Medicine

## 2015-04-11 ENCOUNTER — Encounter: Payer: Self-pay | Admitting: Family Medicine

## 2015-04-11 VITALS — BP 128/80 | HR 70 | Wt 273.0 lb

## 2015-04-11 DIAGNOSIS — G479 Sleep disorder, unspecified: Secondary | ICD-10-CM | POA: Diagnosis not present

## 2015-04-11 DIAGNOSIS — N3001 Acute cystitis with hematuria: Secondary | ICD-10-CM

## 2015-04-11 MED ORDER — SULFAMETHOXAZOLE-TRIMETHOPRIM 800-160 MG PO TABS: 1.0000 | ORAL_TABLET | Freq: Two times a day (BID) | ORAL | Status: DC

## 2015-04-11 NOTE — Progress Notes (Signed)
   Subjective:    Patient ID: Laurie Mckinney, female    DOB: [birth year], 54 y.o.   MRN: [id number]  HPI She has a one-week history of chills, urgency, frequency and dysuria. She also has concerns over sleep study that was done several months ago.   Review of Systems     Objective:   Physical Exam Urgent and in no distress. Urine dipstick was positive for white cells and red cells.       Assessment & Plan:  Sleep disturbance  Acute cystitis with hematuria - Plan: sulfamethoxazole-trimethoprim (BACTRIM DS,SEPTRA DS) 800-160 MG per tablet I also discussed the sleep study. Her AHI was not diagnostic for sleep apnea. She did have a few episodes that qualify but these were only approximate 10 minutes long. I explained this in detail to her and at this point do not think CPAP would be worthwhile.

## 2015-06-04 ENCOUNTER — Encounter: Payer: Self-pay | Admitting: Internal Medicine

## 2015-09-09 ENCOUNTER — Encounter: Payer: Self-pay | Admitting: Gynecologic Oncology

## 2015-09-09 ENCOUNTER — Ambulatory Visit: Payer: 59 | Attending: Gynecologic Oncology | Admitting: Gynecologic Oncology

## 2015-09-09 VITALS — BP 186/89 | HR 74 | Temp 98.0°F | Resp 18 | Ht 71.0 in | Wt 273.7 lb

## 2015-09-09 DIAGNOSIS — N939 Abnormal uterine and vaginal bleeding, unspecified: Secondary | ICD-10-CM | POA: Insufficient documentation

## 2015-09-09 DIAGNOSIS — N3941 Urge incontinence: Secondary | ICD-10-CM | POA: Diagnosis not present

## 2015-09-09 DIAGNOSIS — C541 Malignant neoplasm of endometrium: Secondary | ICD-10-CM | POA: Diagnosis not present

## 2015-09-09 NOTE — Progress Notes (Signed)
FOLLOWUP VISIT ENDOMETRIAL CANCER  CC: vaginal bleeding  Assessment:    54 y.o. year old with Stage IA Grade 1 endometrioid endometrial cancer.   S/p robotic hysterectomy, BSO on 02/05/15. no LVSI, 10% myometrial invasion, negative pelvic washings and lymph nodes not sampled. Dispositioned to close followup.  New vaginal spotting, likely from granulation tissue at cuff. Urge urinary incontinence.   Plan:  1/ Followup vaginal cuff biopsy today - likely is granulation tissue. Will apply more silver nitrate for persistent bleeding. If negative for recurrence, recommend followup in 6 months with Dr Ulanda Edison or myself 2/ continue followup with Dr Matilde Sprang for urge incontinence.   HPI:  Laurie Mckinney is a 54 y.o. year old initially seen in consultation on 01/07/15 for complex atypical hyperplasia, referred by Dr Ulanda Edison.  She then underwent a robotic hysterectomy, BSO on 99991111 without complications.  Her postoperative course was uncomplicated. An occult endometrial cancer was identified on final pathology.  Her final pathologic diagnosis is a Stage IA Grade 1 endometrioid endometrial cancer with no lymphovascular space invasion, 2/22 mm (10%) of myometrial invasion. Lymph nodes were not sampled.  She is seen today for a postoperative check and to discuss her pathology results and treatment plan.  Since discharge from the hospital, she is feeling well.  She has improving appetite, normal bowel and bladder function, and pain controlled with minimal PO medication. She reports persistent mixed stress and urge incontinence (present prior to surgery) and desires referral to a urogynecologist/urologist.  INTERVAL HX: The patient reports an episode of vaginal spotting after intercourse x 1 in October, 2016.  She denies other symptoms concerning for recurrence.  No Known Allergies Current Outpatient Prescriptions on File Prior to Visit  Medication Sig Dispense Refill  . acetaminophen (TYLENOL ARTHRITIS  PAIN) 650 MG CR tablet Take 650 mg by mouth every 8 (eight) hours as needed for pain.    Marland Kitchen aspirin EC 81 MG tablet Take 81 mg by mouth daily.    . Multiple Vitamins-Minerals (MULTIVITAMIN WITH MINERALS) tablet Take 1 tablet by mouth every morning.     Marland Kitchen PARoxetine (PAXIL) 20 MG tablet TAKE 1 TABLET BY MOUTH EVERY DAY 30 tablet 3  . glucosamine-chondroitin 500-400 MG tablet Take 1 tablet by mouth 3 (three) times daily.     No current facility-administered medications on file prior to visit.   Past Medical History  Diagnosis Date  . Migraine headache   . Allergy     RHINITIS  . Obesity   . Adjustment insomnia     SHIFT WORK  . Bursitis     right hip  . Mobitz type 2 second degree heart block   . Atrial flutter [facility]) 02/26/15    [facility]   Past Surgical History  Procedure Laterality Date  . Colonoscopy  2011    Dr.Brodie  . Robotic assisted total hysterectomy with bilateral salpingo oopherectomy Bilateral 02/05/2015    Procedure: ROBOTIC ASSISTED TOTAL LAPAROSCOPIC HYSTERECTOMY WITH BILATERAL SALPINGO OOPHORECTOMY;  Surgeon: Everitt Amber, MD;  Location: [facility];  Service: Gynecology;  Laterality: Bilateral;   Family History  Problem Relation Age of Onset  . Arthritis Mother   . Heart disease Father    Social History   Social History  . Marital Status: Married    Spouse Name: N/A  . Number of Children: N/A  . Years of Education: N/A   Occupational History  . Not on file.   Social History Main Topics  . Smoking status: Never  Smoker   . Smokeless tobacco: Never Used  . Alcohol Use: No  . Drug Use: No  . Sexual Activity: Yes   Other Topics Concern  . Not on file   Social History Narrative     Review of systems: Constitutional:  She has no weight gain or weight loss. She has no fever or chills. Eyes: No blurred vision Ears, Nose, Mouth, Throat: No dizziness, headaches or changes in hearing. No mouth sores. Cardiovascular: No chest pain,  palpitations or edema. Respiratory:  No shortness of breath, wheezing or cough Gastrointestinal: She has normal bowel movements without diarrhea or constipation. She denies any nausea or vomiting. She denies blood in her stool or heart burn. Genitourinary:  She denies pelvic pain, pelvic pressure. She has no hematuria, dysuria, or incontinence. She has no irregular vaginal bleeding or vaginal discharge. + urinary incontinence. Musculoskeletal: Denies muscle weakness or joint pains.  Skin:  She has no skin changes, rashes or itching Neurological:  Denies dizziness or headaches. No neuropathy, no numbness or tingling. Psychiatric:  She denies depression or anxiety. Hematologic/Lymphatic:   No easy bruising or bleeding   Physical Exam: Blood pressure 186/89, pulse 74, temperature 98 F (36.7 C), temperature source Oral, resp. rate 18, height 5\' 11"  (1.803 m), weight 273 lb 11.2 oz (124.15 kg), last menstrual period 01/26/2011, SpO2 98 %. General: Well dressed, well nourished in no apparent distress.   HEENT:  Normocephalic and atraumatic, no lesions.  Extraocular muscles intact. Sclerae anicteric. Pupils equal, round, reactive. No mouth sores or ulcers. Thyroid is normal size, not nodular, midline. Skin:  No lesions or rashes. Breasts:  deferred Lungs: deferred Cardiovascular:  deferred Abdomen:  Soft, nontender, nondistended.  No palpable masses.  No hepatosplenomegaly.  No ascites. Normal bowel sounds.  No hernias.  Incisions are well healed Genitourinary: Normal EGBUS  Vaginal cuff intact.  No bleeding or discharge.  No cul de sac fullness. 2 polypoid, erythematous, <1cm lesions at vaginal cuff. Appear consistent with granualtion tissue. Extremities: No cyanosis, clubbing or edema.  No calf tenderness or erythema. No palpable cords. Psychiatric: Mood and affect are appropriate. Neurological: Awake, alert and oriented x 3. Sensation is intact, no neuropathy.  Musculoskeletal: No pain, normal  strength and range of motion.  PROCEDURE NOTE: VAGINAL BIOPSY  Patient provided verbal consent for vaginal biopsies. The polypoid lesion was grasped with a ring forceps and gently teased free from its vaginal attachments. It was sent for permanent pathology labels vaginal cuff. Hemostasis achieved at the vaginal cuff with silver nitrate. No bleeding was noted. Patient tolerated procedure well.  Donaciano Eva, MD

## 2015-09-09 NOTE — Patient Instructions (Signed)
We will call you with the results of your biopsy from today.  If everything is ok, plan to follow up in six months or sooner if needed.

## 2015-09-11 ENCOUNTER — Telehealth: Payer: Self-pay | Admitting: *Deleted

## 2015-09-11 NOTE — Telephone Encounter (Signed)
Patient returned call. Given benign vaginal biopsy results. Patient voiced understanding and is agreeable to call our office with any additional questions or concerns.

## 2015-09-11 NOTE — Telephone Encounter (Signed)
Per Joylene John, NP attempted to reach patient to give her vaginal biopsy results. Left VM requesting return call from patient.

## 2015-09-12 ENCOUNTER — Telehealth: Payer: Self-pay | Admitting: Gynecologic Oncology

## 2015-09-12 NOTE — Telephone Encounter (Signed)
Granulation tissue on bx. No further action required.

## 2015-09-16 DIAGNOSIS — Z8679 Personal history of other diseases of the circulatory system: Secondary | ICD-10-CM | POA: Insufficient documentation

## 2015-09-24 ENCOUNTER — Other Ambulatory Visit: Payer: Self-pay | Admitting: Family Medicine

## 2015-09-24 NOTE — Telephone Encounter (Signed)
Is this ok to refill?  

## 2015-12-05 ENCOUNTER — Encounter: Payer: Self-pay | Admitting: Family Medicine

## 2015-12-05 ENCOUNTER — Ambulatory Visit (INDEPENDENT_AMBULATORY_CARE_PROVIDER_SITE_OTHER): Payer: 59 | Admitting: Family Medicine

## 2015-12-05 VITALS — BP 152/110 | HR 64 | Temp 98.2°F | Wt 256.0 lb

## 2015-12-05 DIAGNOSIS — J209 Acute bronchitis, unspecified: Secondary | ICD-10-CM

## 2015-12-05 MED ORDER — AMOXICILLIN 875 MG PO TABS: 875.0000 mg | ORAL_TABLET | Freq: Two times a day (BID) | ORAL | Status: DC

## 2015-12-05 NOTE — Progress Notes (Signed)
   Subjective:    Patient ID: Laurie Mckinney, female    DOB: 23-May-1961, 55 y.o.   MRN: [id number]  HPI She has a ten-day history of started with sneezing, sore throat, hoarse voice and malaise. The chest congestion and cough as well as slight fever occurred several days later. No earache, fatigue, shortness of breath. She has been using OTC meds. She now states that the cough has gotten worse as well as developing a slight headache. She does not smoke.  Review of Systems     Objective:   Physical Exam Alert and in no distress. Tympanic membranes and canals are normal. Pharyngeal area is normal. Neck is supple without adenopathy or thyromegaly. Cardiac exam shows a regular sinus rhythm without murmurs or gallops. Lungs are clear to auscultation.        Assessment & Plan:  Acute bronchitis, unspecified organism - Plan: amoxicillin (AMOXIL) 875 MG tablet he will call if not entirely better when she finishes the antibiotic.

## 2015-12-05 NOTE — Patient Instructions (Signed)
Take all antibiotic and if not totally back to normal when he finished call me

## 2015-12-24 ENCOUNTER — Telehealth: Payer: Self-pay | Admitting: Family Medicine

## 2015-12-24 MED ORDER — SULFAMETHOXAZOLE-TRIMETHOPRIM 800-160 MG PO TABS: 1.0000 | ORAL_TABLET | Freq: Two times a day (BID) | ORAL | Status: DC

## 2015-12-24 NOTE — Telephone Encounter (Signed)
Called pt, she had called and left message with the answering service,,states she has a UTI,  And is requesting antibiotics says it is buring when she urinates, and is having frequency to urinate,she has the chills when she urinates,  she is unable to come in to the office, her mom just had eye surgery and has to stay with her, i told her we like to see the pt before we prescribe antibiotics, but states she cannot come in.pt uses [id number] - [place], [place] and pt can be reached at [phone]

## 2015-12-24 NOTE — Telephone Encounter (Signed)
  I called an antibiotic in

## 2015-12-24 NOTE — Telephone Encounter (Signed)
Patient notified to pick up RX.

## 2016-01-10 ENCOUNTER — Encounter: Payer: Self-pay | Admitting: Family Medicine

## 2016-01-10 ENCOUNTER — Ambulatory Visit (INDEPENDENT_AMBULATORY_CARE_PROVIDER_SITE_OTHER): Payer: 59 | Admitting: Family Medicine

## 2016-01-10 VITALS — BP 180/120 | HR 66 | Ht 71.0 in | Wt 248.0 lb

## 2016-01-10 DIAGNOSIS — I441 Atrioventricular block, second degree: Secondary | ICD-10-CM | POA: Diagnosis not present

## 2016-01-10 DIAGNOSIS — IMO0001 Reserved for inherently not codable concepts without codable children: Secondary | ICD-10-CM

## 2016-01-10 DIAGNOSIS — R030 Elevated blood-pressure reading, without diagnosis of hypertension: Secondary | ICD-10-CM

## 2016-01-10 DIAGNOSIS — F341 Dysthymic disorder: Secondary | ICD-10-CM | POA: Diagnosis not present

## 2016-01-10 DIAGNOSIS — Z1159 Encounter for screening for other viral diseases: Secondary | ICD-10-CM

## 2016-01-10 DIAGNOSIS — C541 Malignant neoplasm of endometrium: Secondary | ICD-10-CM

## 2016-01-10 DIAGNOSIS — J301 Allergic rhinitis due to pollen: Secondary | ICD-10-CM

## 2016-01-10 DIAGNOSIS — Z0000 Encounter for general adult medical examination without abnormal findings: Secondary | ICD-10-CM | POA: Diagnosis not present

## 2016-01-10 DIAGNOSIS — E669 Obesity, unspecified: Secondary | ICD-10-CM | POA: Diagnosis not present

## 2016-01-10 LAB — CBC WITH DIFFERENTIAL/PLATELET
Basophils Absolute: 0 {cells}/uL (ref 0–200)
Basophils Relative: 0 %
Eosinophils Absolute: 64 {cells}/uL (ref 15–500)
Eosinophils Relative: 1 %
HCT: 41.8 % (ref 35.0–45.0)
Hemoglobin: 13.8 g/dL (ref 11.7–15.5)
Lymphocytes Relative: 31 %
Lymphs Abs: 1984 {cells}/uL (ref 850–3900)
MCH: 29.7 pg (ref 27.0–33.0)
MCHC: 33 g/dL (ref 32.0–36.0)
MCV: 90.1 fL (ref 80.0–100.0)
MPV: 10.5 fL (ref 7.5–12.5)
Monocytes Absolute: 448 {cells}/uL (ref 200–950)
Monocytes Relative: 7 %
Neutro Abs: 3904 {cells}/uL (ref 1500–7800)
Neutrophils Relative %: 61 %
Platelets: 301 K/uL (ref 140–400)
RBC: 4.64 MIL/uL (ref 3.80–5.10)
RDW: 13.3 % (ref 11.0–15.0)
WBC: 6.4 K/uL (ref 4.0–10.5)

## 2016-01-10 LAB — COMPREHENSIVE METABOLIC PANEL
ALT: 16 U/L (ref 6–29)
AST: 19 U/L (ref 10–35)
Albumin: 3.8 g/dL (ref 3.6–5.1)
Alkaline Phosphatase: 71 U/L (ref 33–130)
BUN: 12 mg/dL (ref 7–25)
CHLORIDE: 104 mmol/L (ref 98–110)
CO2: 24 mmol/L (ref 20–31)
CREATININE: 0.58 mg/dL (ref 0.50–1.05)
Calcium: 9.3 mg/dL (ref 8.6–10.4)
GLUCOSE: 77 mg/dL (ref 65–99)
POTASSIUM: 4.3 mmol/L (ref 3.5–5.3)
SODIUM: 139 mmol/L (ref 135–146)
Total Bilirubin: 0.4 mg/dL (ref 0.2–1.2)
Total Protein: 7.4 g/dL (ref 6.1–8.1)

## 2016-01-10 LAB — LIPID PANEL
Cholesterol: 121 mg/dL — ABNORMAL LOW (ref 125–200)
HDL: 56 mg/dL
LDL Cholesterol: 52 mg/dL
Total CHOL/HDL Ratio: 2.2 ratio
Triglycerides: 63 mg/dL
VLDL: 13 mg/dL

## 2016-01-10 LAB — HEPATITIS C ANTIBODY: HCV Ab: NEGATIVE

## 2016-01-10 MED ORDER — PAROXETINE HCL 20 MG PO TABS: 20.0000 mg | ORAL_TABLET | Freq: Every day | ORAL | Status: DC

## 2016-01-10 NOTE — Progress Notes (Signed)
Subjective:    Patient ID: Laurie Mckinney, female    DOB: [birth year], 55 y.o.   MRN: [id number]  HPI She is here for complete examination. She had a hysterectomy and prior to this did have cardiac irregularity. She was seen by her cardiologist Dr. Hamilton Capri. The record was reviewed in care everywhere. There was questionable atrial flutter however further investigation and discussion with one of his partners indicates it was really second-degree block. She also has a history of obesity and is in the process of losing weight. She is making lifestyle changes in regard to her eating and exercise pattern. She has lost roughly over 20 pounds. She continues to do quite nicely on the Paxil. She has tried stopping this in the past with very little success and plans to continue on this indefinitely. Her allergies seem to be under good control. Her work and home life are going well. She has had no chest pain, shortness breath, PND, GI symptoms. Her health maintenance, immunizations social and family history was reviewed.   Review of Systems  All other systems reviewed and are negative.      Objective:   Physical Exam BP 180/120 mmHg  Pulse 66  Ht 5\' 11"  (1.803 m)  Wt 248 lb (112.492 kg)  BMI 34.60 kg/m2  SpO2 98%  LMP 01/26/2011  General Appearance:    Alert, cooperative, no distress, appears stated age  Head:    Normocephalic, without obvious abnormality, atraumatic  Eyes:    PERRL, conjunctiva/corneas clear, EOM's intact, fundi    benign  Ears:    Normal TM's and external ear canals  Nose:   Nares normal, mucosa normal, no drainage or sinus   tenderness  Throat:   Lips, mucosa, and tongue normal; teeth and gums normal  Neck:   Supple, no lymphadenopathy;  thyroid:  no   enlargement/tenderness/nodules; no carotid   bruit or JVD  Back:    Spine nontender, no curvature, ROM normal, no CVA     tenderness  Lungs:     Clear to auscultation bilaterally without wheezes, rales or     ronchi;  respirations unlabored  Chest Wall:    No tenderness or deformity   Heart:    Regular rate and rhythm, S1 and S2 normal, no murmur, rub   or gallop  Breast Exam:    Deferred to GYN  Abdomen:     Soft, non-tender, nondistended, normoactive bowel sounds,    no masses, no hepatosplenomegaly  Genitalia:    Deferred to GYN     Extremities:   No clubbing, cyanosis or edema  Pulses:   2+ and symmetric all extremities  Skin:   Skin color, texture, turgor normal, no rashes or lesions  Lymph nodes:   Cervical, supraclavicular, and axillary nodes normal  Neurologic:   CNII-XII intact, normal strength, sensation and gait; reflexes 2+ and symmetric throughout          Psych:   Normal mood, affect, hygiene and grooming.   EKG does show second-degree block. Further discussion with cardiology indicates this is an old finding.       Assessment & Plan:  Routine general medical examination at a health care facility - Plan: CBC with Differential/Platelet, Comprehensive metabolic panel, Lipid panel  Second degree AV block, Mobitz type I - Plan: EKG 12-Lead  Elevated blood pressure - Plan: CBC with Differential/Platelet, Comprehensive metabolic panel  Obesity (BMI 30-39.9) - Plan: CBC with Differential/Platelet, Comprehensive metabolic panel, Lipid panel  Allergic rhinitis  due to pollen, unspecified rhinitis seasonality  Need for hepatitis C screening test - Plan: Hepatitis C antibody  Endometrial cancer, grade I ([place]) After discussion with cardiology, I will recommend she get a blood pressure cuff and keep track of her blood pressure readings at home. She is to bring her cuff in here and we will compare notes concerning her blood pressure and probably place her on an ACE inhibitor. Encouraged her to continue with the present lifestyle modification and we will continue on Paxil.

## 2016-01-16 ENCOUNTER — Other Ambulatory Visit: Payer: Self-pay | Admitting: Family Medicine

## 2016-01-16 NOTE — Telephone Encounter (Signed)
Is this okay to refill? 

## 2016-02-20 ENCOUNTER — Encounter: Payer: Self-pay | Admitting: Family Medicine

## 2016-02-20 ENCOUNTER — Ambulatory Visit (INDEPENDENT_AMBULATORY_CARE_PROVIDER_SITE_OTHER): Payer: 59 | Admitting: Family Medicine

## 2016-02-20 VITALS — BP 170/100 | HR 67 | Ht 71.0 in | Wt 255.0 lb

## 2016-02-20 DIAGNOSIS — I10 Essential (primary) hypertension: Secondary | ICD-10-CM

## 2016-02-20 MED ORDER — LISINOPRIL-HYDROCHLOROTHIAZIDE 10-12.5 MG PO TABS: 1.0000 | ORAL_TABLET | Freq: Every day | ORAL | Status: DC

## 2016-02-20 NOTE — Progress Notes (Signed)
Subjective:     Patient ID: Laurie Mckinney, female   DOB: 1961/03/26, 55 y.o.   MRN: [id number]  HPI [name] presents today with elevated bp.  She was seen here in April with an elevated bp at 180/120.  She has been taking her bp at home for the past 2 weeks with multiple readings around 200/100.  She denies any diaphoresis, sense of impending doom, vision changes, and has no history of kidney dz.  She had a normal EKG, lipid panel, and BMP 1 month ago.  She sees a cardiologist around every 6 mo for here recent diagnosis of A Fib found on presurgical screening.     Review of Systems     Objective:   Physical Exam Alert and in no distress.  Fundoscopic exam reveals normal vessels without hemorrhage.  Neck is supple without adenopathy or thyromegaly.  Cardiac exam shows a regular sinus rhythm without murmurs or gallops.  Lungs are clear to auscultation bilaterally without wheezes, rales, rhonchi.  bp -- 170/100     Assessment/Plan:     Essential hypertension - Plan: lisinopril-hydrochlorothiazide (PRINZIDE,ZESTORETIC) 10-12.5 MG tablet  Given her multiple elevated readings and more severely elevated bp, will start with combination lisinopril-HCTZ today.    channel blocker was considered however I think I would rather put her on a combination medicine to start.    History and physical exam conducted by Marylen Ponto (Medical Student) in conjunction with Dr. Redmond School.

## 2016-03-11 ENCOUNTER — Ambulatory Visit: Payer: 59 | Attending: Gynecologic Oncology | Admitting: Gynecologic Oncology

## 2016-03-11 ENCOUNTER — Encounter: Payer: Self-pay | Admitting: Gynecologic Oncology

## 2016-03-11 VITALS — BP 145/77 | HR 79 | Temp 98.6°F | Resp 18 | Ht 71.0 in | Wt 256.9 lb

## 2016-03-11 DIAGNOSIS — Z8249 Family history of ischemic heart disease and other diseases of the circulatory system: Secondary | ICD-10-CM | POA: Insufficient documentation

## 2016-03-11 DIAGNOSIS — M7071 Other bursitis of hip, right hip: Secondary | ICD-10-CM | POA: Insufficient documentation

## 2016-03-11 DIAGNOSIS — Z7982 Long term (current) use of aspirin: Secondary | ICD-10-CM | POA: Diagnosis not present

## 2016-03-11 DIAGNOSIS — C541 Malignant neoplasm of endometrium: Secondary | ICD-10-CM | POA: Diagnosis present

## 2016-03-11 DIAGNOSIS — Z8542 Personal history of malignant neoplasm of other parts of uterus: Secondary | ICD-10-CM | POA: Diagnosis not present

## 2016-03-11 DIAGNOSIS — G43909 Migraine, unspecified, not intractable, without status migrainosus: Secondary | ICD-10-CM | POA: Diagnosis not present

## 2016-03-11 DIAGNOSIS — Z8261 Family history of arthritis: Secondary | ICD-10-CM | POA: Diagnosis not present

## 2016-03-11 DIAGNOSIS — E669 Obesity, unspecified: Secondary | ICD-10-CM | POA: Diagnosis not present

## 2016-03-11 DIAGNOSIS — Z90722 Acquired absence of ovaries, bilateral: Secondary | ICD-10-CM

## 2016-03-11 DIAGNOSIS — Z90710 Acquired absence of both cervix and uterus: Secondary | ICD-10-CM | POA: Diagnosis not present

## 2016-03-11 DIAGNOSIS — I4892 Unspecified atrial flutter: Secondary | ICD-10-CM | POA: Diagnosis not present

## 2016-03-11 NOTE — Progress Notes (Signed)
FOLLOWUP VISIT ENDOMETRIAL CANCER Chief Complaint  Patient presents with  . endometrial cancer    follow up     Assessment:    55 y.o. year old with history of Stage IA Grade 1 endometrioid endometrial cancer.   S/p robotic hysterectomy, BSO on 02/05/15. Low risk features on pathology, therefore no adjuvant therapy recommended. No evidence for disease recurrence on today's exam.   Plan: Followup exam with Dr Ulanda Edison in December 2017 and with me in June 2018. Recommend continued 6 monthly surveillance until June, 2021.    HPI:  Laurie Mckinney is a 55 y.o. year old initially seen in consultation on 01/07/15 for complex atypical hyperplasia, referred by Dr Ulanda Edison.  She then underwent a robotic hysterectomy, BSO on 99991111 without complications.  Her postoperative course was uncomplicated. An occult endometrial cancer was identified on final pathology.  Her final pathologic diagnosis is a Stage IA Grade 1 endometrioid endometrial cancer with no lymphovascular space invasion, 2/22 mm (10%) of myometrial invasion. Lymph nodes were not sampled.  She had bleeding from granulation tissue at the vaginal cuff removed in December 2016.  INTERVAL HX: The patient reports no further episodes of vaginal spotting.  She denies other symptoms concerning for recurrence.  No Known Allergies Current Outpatient Prescriptions on File Prior to Visit  Medication Sig Dispense Refill  . acetaminophen (TYLENOL ARTHRITIS PAIN) 650 MG CR tablet Take 650 mg by mouth every 8 (eight) hours as needed for pain.    Marland Kitchen aspirin EC 81 MG tablet Take 81 mg by mouth daily.    Marland Kitchen lisinopril-hydrochlorothiazide (PRINZIDE,ZESTORETIC) 10-12.5 MG tablet Take 1 tablet by mouth daily. 90 tablet 3  . Multiple Vitamins-Minerals (MULTIVITAMIN WITH MINERALS) tablet Take 1 tablet by mouth every morning.     Marland Kitchen oxybutynin (DITROPAN-XL) 5 MG 24 hr tablet Take 5 mg by mouth daily.    Marland Kitchen PARoxetine (PAXIL) 20 MG tablet Take 1 tablet (20 mg total)  by mouth daily. 90 tablet 3   No current facility-administered medications on file prior to visit.   Past Medical History  Diagnosis Date  . Migraine headache   . Allergy     RHINITIS  . Obesity   . Adjustment insomnia     SHIFT WORK  . Bursitis     right hip  . Mobitz type 2 second degree heart block   . Atrial flutter [facility]) 02/26/15    [facility]   Past Surgical History  Procedure Laterality Date  . Colonoscopy  2011    Dr.Brodie  . Robotic assisted total hysterectomy with bilateral salpingo oopherectomy Bilateral 02/05/2015    Procedure: ROBOTIC ASSISTED TOTAL LAPAROSCOPIC HYSTERECTOMY WITH BILATERAL SALPINGO OOPHORECTOMY;  Surgeon: Everitt Amber, MD;  Location: [facility];  Service: Gynecology;  Laterality: Bilateral;   Family History  Problem Relation Age of Onset  . Arthritis Mother   . Heart disease Father    Social History   Social History  . Marital Status: Married    Spouse Name: N/A  . Number of Children: N/A  . Years of Education: N/A   Occupational History  . Not on file.   Social History Main Topics  . Smoking status: Never Smoker   . Smokeless tobacco: Never Used  . Alcohol Use: No  . Drug Use: No  . Sexual Activity: Yes   Other Topics Concern  . Not on file   Social History Narrative     Review of systems: Constitutional:  She has no weight gain  or weight loss. She has no fever or chills. Eyes: No blurred vision Ears, Nose, Mouth, Throat: No dizziness, headaches or changes in hearing. No mouth sores. Cardiovascular: No chest pain, palpitations or edema. Respiratory:  No shortness of breath, wheezing or cough Gastrointestinal: She has normal bowel movements without diarrhea or constipation. She denies any nausea or vomiting. She denies blood in her stool or heart burn. Genitourinary:  She denies pelvic pain, pelvic pressure. She has no hematuria, dysuria, or incontinence. She has no irregular vaginal bleeding or vaginal discharge.  + urinary incontinence. Musculoskeletal: Denies muscle weakness or joint pains.  Skin:  She has no skin changes, rashes or itching Neurological:  Denies dizziness or headaches. No neuropathy, no numbness or tingling. Psychiatric:  She denies depression or anxiety. Hematologic/Lymphatic:   No easy bruising or bleeding   Physical Exam: Blood pressure 145/77, pulse 79, temperature 98.6 F (37 C), temperature source Oral, resp. rate 18, height 5\' 11"  (1.803 m), weight 256 lb 14.4 oz (116.529 kg), last menstrual period 01/26/2011, SpO2 98 %. General: Well dressed, well nourished in no apparent distress.   HEENT:  Normocephalic and atraumatic, no lesions.  Extraocular muscles intact. Sclerae anicteric. Pupils equal, round, reactive. No mouth sores or ulcers. Thyroid is normal size, not nodular, midline. Skin:  No lesions or rashes. Breasts:  deferred Lungs: deferred Cardiovascular:  deferred Abdomen:  Soft, nontender, nondistended.  No palpable masses.  No hepatosplenomegaly.  No ascites. Normal bowel sounds.  No hernias.  Incisions are well healed Genitourinary: Normal EGBUS  Vaginal cuff intact.  No bleeding or discharge.  No cul de sac fullness. Normal cuff. Extremities: No cyanosis, clubbing or edema.  No calf tenderness or erythema. No palpable cords. Psychiatric: Mood and affect are appropriate. Neurological: Awake, alert and oriented x 3. Sensation is intact, no neuropathy.  Musculoskeletal: No pain, normal strength and range of motion.  Donaciano Eva, MD

## 2016-03-11 NOTE — Patient Instructions (Signed)
Plan to follow up in December 2017 with Dr. Ulanda Edison and Dr. Denman George in one year.  Please call after you see Dr. Ulanda Edison to schedule your appt with Dr. Denman George.

## 2016-03-13 ENCOUNTER — Ambulatory Visit: Payer: 59 | Admitting: Gynecologic Oncology

## 2016-03-23 ENCOUNTER — Ambulatory Visit (INDEPENDENT_AMBULATORY_CARE_PROVIDER_SITE_OTHER): Payer: 59 | Admitting: Family Medicine

## 2016-03-23 ENCOUNTER — Encounter: Payer: Self-pay | Admitting: Family Medicine

## 2016-03-23 VITALS — BP 140/74 | HR 62 | Wt 256.0 lb

## 2016-03-23 DIAGNOSIS — I10 Essential (primary) hypertension: Secondary | ICD-10-CM

## 2016-03-23 NOTE — Progress Notes (Signed)
   Subjective:    Patient ID: Laurie Mckinney, female    DOB: 05-05-1961, 56 y.o.   MRN: [id number]  HPI She is pressure recheck. She has been on her present medication regimen and has had no difficulty with this.   Review of Systems     Objective:   Physical Exam Alert and in no distress. Blood pressure is recorded.       Assessment & Plan:  Essential hypertension - Plan: lisinopril-hydrochlorothiazide (PRINZIDE,ZESTORETIC) 10-12.5 MG tablet Continue on present medication and recheck here in approximately one year.

## 2016-07-27 ENCOUNTER — Other Ambulatory Visit: Payer: Self-pay

## 2016-07-27 ENCOUNTER — Telehealth: Payer: Self-pay | Admitting: Family Medicine

## 2016-07-27 MED ORDER — SULFAMETHOXAZOLE-TRIMETHOPRIM 800-160 MG PO TABS: 1.0000 | ORAL_TABLET | Freq: Two times a day (BID) | ORAL | 0 refills | Status: DC

## 2016-07-27 NOTE — Telephone Encounter (Signed)
Called med in called pt she said she cant take AZO because it makes her stomach bleed I told her to drink plenty of water and cranberry juice and take the septra twice a day for 3 days if no better to make appointment to come in

## 2016-07-27 NOTE — Telephone Encounter (Signed)
Have her use Azo-Standard and caution that'll make her urine turn yellowish orange and: 3 days of Septra one twice a day and if still having symptoms, have her return here

## 2016-07-27 NOTE — Telephone Encounter (Signed)
Pt called and states that she has a UTI, states she gets them frequently. States she cannot hold her urine, and that she is having to go every 10 to 15 mins and that she is burning and itching and was wondering if you could send her something in for that, I tried to make her appt but states that she lives in [place] and that she could not hold her urine that long to come here, pt would like it sent to the [place], [place] and pt can be reached at [phone]

## 2016-08-21 LAB — HM PAP SMEAR: HM Pap smear: NORMAL

## 2016-08-21 LAB — HM MAMMOGRAPHY

## 2017-01-19 ENCOUNTER — Other Ambulatory Visit: Payer: Self-pay | Admitting: Family Medicine

## 2017-01-19 NOTE — Telephone Encounter (Signed)
Is this okay to refill? 

## 2017-01-21 ENCOUNTER — Encounter: Payer: Self-pay | Admitting: Family Medicine

## 2017-01-21 ENCOUNTER — Other Ambulatory Visit: Payer: Self-pay | Admitting: Family Medicine

## 2017-01-21 ENCOUNTER — Ambulatory Visit (INDEPENDENT_AMBULATORY_CARE_PROVIDER_SITE_OTHER): Payer: 59 | Admitting: Family Medicine

## 2017-01-21 VITALS — BP 124/82 | HR 56 | Ht 71.0 in | Wt 281.0 lb

## 2017-01-21 DIAGNOSIS — Z0000 Encounter for general adult medical examination without abnormal findings: Secondary | ICD-10-CM

## 2017-01-21 DIAGNOSIS — Z8542 Personal history of malignant neoplasm of other parts of uterus: Secondary | ICD-10-CM | POA: Diagnosis not present

## 2017-01-21 DIAGNOSIS — E669 Obesity, unspecified: Secondary | ICD-10-CM

## 2017-01-21 DIAGNOSIS — I10 Essential (primary) hypertension: Secondary | ICD-10-CM | POA: Diagnosis not present

## 2017-01-21 DIAGNOSIS — Z8669 Personal history of other diseases of the nervous system and sense organs: Secondary | ICD-10-CM | POA: Diagnosis not present

## 2017-01-21 DIAGNOSIS — F341 Dysthymic disorder: Secondary | ICD-10-CM | POA: Diagnosis not present

## 2017-01-21 DIAGNOSIS — J301 Allergic rhinitis due to pollen: Secondary | ICD-10-CM

## 2017-01-21 DIAGNOSIS — E7439 Other disorders of intestinal carbohydrate absorption: Secondary | ICD-10-CM

## 2017-01-21 LAB — CBC WITH DIFFERENTIAL/PLATELET
BASOS PCT: 0 %
Basophils Absolute: 0 cells/uL (ref 0–200)
EOS ABS: 80 {cells}/uL (ref 15–500)
Eosinophils Relative: 1 %
HEMATOCRIT: 41.8 % (ref 35.0–45.0)
Hemoglobin: 13.8 g/dL (ref 11.7–15.5)
LYMPHS PCT: 28 %
Lymphs Abs: 2240 cells/uL (ref 850–3900)
MCH: 30.3 pg (ref 27.0–33.0)
MCHC: 33 g/dL (ref 32.0–36.0)
MCV: 91.7 fL (ref 80.0–100.0)
MONO ABS: 640 {cells}/uL (ref 200–950)
MONOS PCT: 8 %
MPV: 10.5 fL (ref 7.5–12.5)
Neutro Abs: 5040 cells/uL (ref 1500–7800)
Neutrophils Relative %: 63 %
PLATELETS: 287 10*3/uL (ref 140–400)
RBC: 4.56 MIL/uL (ref 3.80–5.10)
RDW: 13.5 % (ref 11.0–15.0)
WBC: 8 10*3/uL (ref 4.0–10.5)

## 2017-01-21 LAB — POCT URINALYSIS DIPSTICK
Bilirubin, UA: NEGATIVE
Glucose, UA: NEGATIVE
KETONES UA: NEGATIVE
Leukocytes, UA: NEGATIVE
Nitrite, UA: NEGATIVE
PH UA: 6.5 (ref 5.0–8.0)
PROTEIN UA: NEGATIVE
RBC UA: NEGATIVE
SPEC GRAV UA: 1.015 (ref 1.010–1.025)
UROBILINOGEN UA: NEGATIVE U/dL — AB

## 2017-01-21 LAB — COMPREHENSIVE METABOLIC PANEL
ALK PHOS: 62 U/L (ref 33–130)
ALT: 23 U/L (ref 6–29)
AST: 24 U/L (ref 10–35)
Albumin: 3.8 g/dL (ref 3.6–5.1)
BUN: 16 mg/dL (ref 7–25)
CALCIUM: 9.2 mg/dL (ref 8.6–10.4)
CHLORIDE: 105 mmol/L (ref 98–110)
CO2: 24 mmol/L (ref 20–31)
Creat: 0.81 mg/dL (ref 0.50–1.05)
GLUCOSE: 110 mg/dL — AB (ref 65–99)
Potassium: 4.4 mmol/L (ref 3.5–5.3)
Sodium: 139 mmol/L (ref 135–146)
TOTAL PROTEIN: 7.2 g/dL (ref 6.1–8.1)
Total Bilirubin: 0.6 mg/dL (ref 0.2–1.2)

## 2017-01-21 LAB — LIPID PANEL
CHOL/HDL RATIO: 2.5 ratio (ref <5.0)
CHOLESTEROL: 123 mg/dL (ref <200)
HDL: 49 mg/dL — AB (ref >50)
LDL Cholesterol: 63 mg/dL (ref <100)
Triglycerides: 55 mg/dL (ref <150)
VLDL: 11 mg/dL (ref <30)

## 2017-01-21 LAB — TSH: TSH: 0.71 mIU/L

## 2017-01-21 MED ORDER — BUPROPION HCL ER (SR) 100 MG PO TB12: 100.0000 mg | ORAL_TABLET | Freq: Two times a day (BID) | ORAL | 1 refills | Status: DC

## 2017-01-21 MED ORDER — LISINOPRIL-HYDROCHLOROTHIAZIDE 10-12.5 MG PO TABS: 1.0000 | ORAL_TABLET | Freq: Every day | ORAL | 3 refills | Status: DC

## 2017-01-21 MED ORDER — PAROXETINE HCL 20 MG PO TABS: 20.0000 mg | ORAL_TABLET | Freq: Every day | ORAL | 3 refills | Status: DC

## 2017-01-21 NOTE — Progress Notes (Addendum)
Subjective:    Patient ID: Laurie Mckinney, female    DOB: [birth year], 58 y.o.   MRN: [id number]  HPI She is here for complete examination. She has gained weight and blames this on at work. 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 She also realizes that she has been stress eating. She does complain of some slight shortness of breath and fatigue more so than in the past and thinks this can be related to her increased weight gain. She has a history of snoring and in the past did have sleep study done which was negative. She does complain of some back discomfort and usually this is more noticeable when she is more physically active at work. She continues on her Paxil for underlying dysthymia. She recently saw urology and is now on Mirapex as well as Ditropan. She continues on Paxil and is considering getting counseling to help deal with the stresses that she is under. She does complain of hand and wrist discomfort and notes it mostly when she is driving. She cannot be specific as to which fingers are involved with a decreased sensation. She does state she has had 3 falls within the last year but all of them were related to objects getting in her way rather than tripping over nothing in particular.Se has a previous history of migraines but has not had any in the recent past. She has had a hysterectomy for treatment of endometrial cancer. Social and family history as well as health maintenance and immunizations were reviewed.  Review of Systems  All other systems reviewed and are negative.      Objective:   Physical Exam BP 124/82   Pulse (!) 56   Ht 5\' 11"  (1.803 m)   Wt 281 lb (127.5 kg)   LMP 01/26/2011   SpO2 96%   BMI 39.19 kg/m   General Appearance:    Alert, cooperative, no distress, appears stated age  Head:    Normocephalic, without obvious abnormality, atraumatic  Eyes:    PERRL, conjunctiva/corneas clear, EOM's intact, fundi    benign  Ears:     Normal TM's and external ear canals  Nose:   Nares normal, mucosa normal, no drainage or sinus   tenderness  Throat:   Lips, mucosa, and tongue normal; teeth and gums normal  Neck:   Supple, no lymphadenopathy;  thyroid:  no   enlargement/tenderness/nodules; no carotid   bruit or JVD     Lungs:     Clear to auscultation bilaterally without wheezes, rales or     ronchi; respirations unlabored  Chest Wall:    No tenderness or deformity   Heart:    Regular rate and rhythm, S1 and S2 normal, no murmur, rub   or gallop  Breast Exam:    Deferred to GYN  Abdomen:     Soft, non-tender, nondistended, normoactive bowel sounds,    no masses, no hepatosplenomegaly  Genitalia:    Deferred to GYN     Extremities:   No clubbing, cyanosis or edema  Pulses:   2+ and symmetric all extremities  Skin:   Skin color, texture, turgor normal, no rashes or lesions  Lymph nodes:   Cervical, supraclavicular, and axillary nodes normal  Neurologic:   CNII-XII intact, normal strength, sensation and gait; reflexes 2+ and symmetric throughout          Psych:   Normal mood, affect, hygiene and grooming.  Assessment & Plan:  Routine general medical examination at a health care facility - Plan: POCT Urinalysis Dipstick, CBC with Differential/Platelet, Comprehensive metabolic panel, Lipid panel  Allergic rhinitis due to pollen, unspecified seasonality  Obesity (BMI 30-39.9) - Plan: CBC with Differential/Platelet, Comprehensive metabolic panel, Lipid panel, TSH  Dysthymia - Plan: buPROPion (WELLBUTRIN SR) 100 MG 12 hr tablet  History of migraine headaches  History of endometrial cancer  Essential hypertension She is stable on taking care of her allergies. Discussed diet and exercise as well as the stress that she is under. I will add Wellbutrin back to her regimen. Hopefully this will help put her in a better psychological state of mind. Will also refer her for counseling to Darryl Hyers. Continue on  lisinopril and Paxil. Will add Wellbutrin to the regimen and recheck here in one month. Recommend she keep track of when she has symptoms in her hands especially wrist and elbow position as well as which fingers tend to cause the most trouble. I relayed that a lot of her aches and pains are probably due to weight gain and psychologically. Did not feel the need to pursue possible OSA at this point. She is to return here in one month for a recheck.  A1c came back elevated. Information given to her concerning the risk for diabetes.

## 2017-01-21 NOTE — Patient Instructions (Signed)
Laurie Mckinney [phone] 

## 2017-01-22 ENCOUNTER — Encounter: Payer: Self-pay | Admitting: Family Medicine

## 2017-01-23 LAB — HEMOGLOBIN A1C
HEMOGLOBIN A1C: 6.2 % — AB (ref <5.7)
Mean Plasma Glucose: 131 mg/dL

## 2017-02-24 ENCOUNTER — Ambulatory Visit (INDEPENDENT_AMBULATORY_CARE_PROVIDER_SITE_OTHER): Payer: 59 | Admitting: Family Medicine

## 2017-02-24 ENCOUNTER — Encounter: Payer: Self-pay | Admitting: Family Medicine

## 2017-02-24 VITALS — BP 122/82 | HR 61 | Wt 277.0 lb

## 2017-02-24 DIAGNOSIS — L570 Actinic keratosis: Secondary | ICD-10-CM | POA: Insufficient documentation

## 2017-02-24 DIAGNOSIS — F341 Dysthymic disorder: Secondary | ICD-10-CM

## 2017-02-24 MED ORDER — BUPROPION HCL ER (SR) 100 MG PO TB12: 100.0000 mg | ORAL_TABLET | Freq: Every day | ORAL | 1 refills | Status: DC

## 2017-02-24 NOTE — Progress Notes (Signed)
   Subjective:    Patient ID: Laurie Mckinney, female    DOB: 1961-06-16, 56 y.o.   MRN: [id number]  HPI She is here for follow-up. Since last being seen she has started on the Wellbutrin. Also her work has changed and she is now in a more stable environment in terms of her working hours and her ability to get into a regular sleeping pattern. She states that she is doing much better and is very happy with the progress that she has made. She did not get involved in counseling. She also has lesions on the forehead that she would like evaluated.  Review of Systems     Objective:   Physical Exam Alert and in no distress with appropriate affect. 2 round smooth pigmented lesions are noted on the upper forehead and temporal area.       Assessment & Plan:  Actinic keratosis  Dysthymia - Plan: buPROPion (WELLBUTRIN SR) 100 MG 12 hr tablet She will continue on her Wellbutrin and other medications. Also explained that the actinic or ptosis are not cancerous but did recommend watching and if they do grow and become unsightly, referral can be made.

## 2017-04-18 ENCOUNTER — Other Ambulatory Visit: Payer: Self-pay | Admitting: Family Medicine

## 2017-04-19 NOTE — Telephone Encounter (Signed)
Is this okay to refill? 

## 2017-06-24 ENCOUNTER — Ambulatory Visit (INDEPENDENT_AMBULATORY_CARE_PROVIDER_SITE_OTHER): Payer: 59 | Admitting: Medical

## 2017-06-24 ENCOUNTER — Encounter: Payer: Self-pay | Admitting: Medical

## 2017-06-24 VITALS — BP 128/80 | HR 68 | Wt 276.4 lb

## 2017-06-24 DIAGNOSIS — H9312 Tinnitus, left ear: Secondary | ICD-10-CM | POA: Diagnosis not present

## 2017-06-24 DIAGNOSIS — L989 Disorder of the skin and subcutaneous tissue, unspecified: Secondary | ICD-10-CM | POA: Diagnosis not present

## 2017-06-24 DIAGNOSIS — H9193 Unspecified hearing loss, bilateral: Secondary | ICD-10-CM | POA: Diagnosis not present

## 2017-06-24 NOTE — Progress Notes (Signed)
Subjective: Chief Complaint  Patient presents with  . ears feels full    ear feels full and having trouble hearing x 2 months   Here for hearing loss she has noticed in left ear the past 2 months.    Works at [employer], around a lot of ambient noise, has gone to the gun range once this past year.  Doesn't necessarily listen to loud music.  Lately having problems answering the phone at work given change in hearing.  Sometimes has ringing in the left ear.   Does get some head congestion, allergies.   Left nostril has been stopped up some.   Past Medical History:  Diagnosis Date  . Adjustment insomnia    SHIFT WORK  . Allergy    RHINITIS  . Atrial flutter [facility]) 02/26/15   [facility] [place]  . Bursitis    right hip  . Migraine headache   . Mobitz type 2 second degree heart block   . Obesity    Current Outpatient Prescriptions on File Prior to Visit  Medication Sig Dispense Refill  . acetaminophen (TYLENOL ARTHRITIS PAIN) 650 MG CR tablet Take 650 mg by mouth every 8 (eight) hours as needed for pain.    Marland Kitchen aspirin EC 81 MG tablet Take 81 mg by mouth daily.    Marland Kitchen buPROPion (WELLBUTRIN SR) 100 MG 12 hr tablet Take 1 tablet (100 mg total) by mouth daily. 90 tablet 1  . lisinopril-hydrochlorothiazide (PRINZIDE,ZESTORETIC) 10-12.5 MG tablet Take 1 tablet by mouth daily. 90 tablet 3  . magnesium gluconate (MAGONATE) 500 MG tablet Take 500 mg by mouth once.     . Multiple Vitamins-Minerals (MULTIVITAMIN WITH MINERALS) tablet Take 1 tablet by mouth every morning.     Marland Kitchen oxybutynin (DITROPAN-XL) 5 MG 24 hr tablet Take 5 mg by mouth daily.    Marland Kitchen PARoxetine (PAXIL) 20 MG tablet Take 1 tablet (20 mg total) by mouth daily. 90 tablet 3   No current facility-administered medications on file prior to visit.    ROS as in subjective  Objective: BP 128/80   Pulse 68   Wt 276 lb 6.4 oz (125.4 kg)   LMP 01/26/2011   SpO2 96%   BMI 38.55 kg/m   General appearance: alert, no distress, WD/WN,   HEENT: normocephalic, sclerae anicteric, PERRLA, EOMi, nares patent, no discharge or erythema, pharynx normal Oral cavity: MMM, no lesions Neck: supple, no lymphadenopathy, no thyromegaly, no masses Heart: RRR, normal S1, S2, no murmurs Extremities: no edema, no cyanosis, no clubbing Pulses: 2+ symmetric, upper and lower extremities, normal cap refill Neurological: alert, oriented x 3, CN2-12 intact, strength normal upper extremities and lower extremities, sensation normal throughout, DTRs 2+ throughout, no cerebellar signs, gait normal Psychiatric: normal affect, behavior normal, pleasant  Skin: left medial inferior orbit adjacent to mare with raised 10mm pinkish lesion round papular, and 2 small similar lesions inferior to this.   Assessment: Encounter Diagnoses  Name Primary?  . Bilateral hearing loss, unspecified hearing loss type Yes  . Tinnitus of left ear   . Skin lesion     Plan: Hearing loss, abnormal hearing screen - can try Flonase for the next 2 weeks given some mild allergy symptoms, but I suspect underlying problem.   Refer to ENT  Skin lesion - f/u with Dr. Redmond School for further eval, possible cryotherapy or other  Dawnmarie was seen today for ears feels full.  Diagnoses and all orders for this visit:  Bilateral hearing loss, unspecified hearing  loss type -     Ambulatory referral to ENT  Tinnitus of left ear -     Ambulatory referral to ENT  Skin lesion

## 2017-07-08 ENCOUNTER — Other Ambulatory Visit: Payer: Self-pay | Admitting: Otolaryngology

## 2017-07-09 ENCOUNTER — Encounter: Payer: Self-pay | Admitting: Family Medicine

## 2017-07-13 ENCOUNTER — Other Ambulatory Visit: Payer: Self-pay | Admitting: Otolaryngology

## 2017-07-13 DIAGNOSIS — IMO0001 Reserved for inherently not codable concepts without codable children: Secondary | ICD-10-CM

## 2017-07-13 DIAGNOSIS — H9042 Sensorineural hearing loss, unilateral, left ear, with unrestricted hearing on the contralateral side: Secondary | ICD-10-CM

## 2017-07-23 ENCOUNTER — Encounter: Payer: Self-pay | Admitting: Radiology

## 2017-07-23 ENCOUNTER — Ambulatory Visit
Admission: RE | Admit: 2017-07-23 | Discharge: 2017-07-23 | Disposition: A | Discharge status: Home / Self Care | Payer: 59 | Source: Ambulatory Visit | Attending: Otolaryngology | Admitting: Otolaryngology

## 2017-07-23 DIAGNOSIS — H9042 Sensorineural hearing loss, unilateral, left ear, with unrestricted hearing on the contralateral side: Secondary | ICD-10-CM

## 2017-07-23 DIAGNOSIS — IMO0001 Reserved for inherently not codable concepts without codable children: Secondary | ICD-10-CM

## 2017-07-23 MED ORDER — GADOBENATE DIMEGLUMINE 529 MG/ML IV SOLN
20.0000 mL | Freq: Once | INTRAVENOUS | Status: AC | PRN
  Administered 2017-07-23: 20 mL via INTRAVENOUS

## 2017-08-05 ENCOUNTER — Encounter: Payer: Self-pay | Admitting: Family Medicine

## 2017-09-20 ENCOUNTER — Encounter: Payer: Self-pay | Admitting: Family Medicine

## 2017-09-20 ENCOUNTER — Ambulatory Visit (INDEPENDENT_AMBULATORY_CARE_PROVIDER_SITE_OTHER): Payer: 59 | Admitting: Family Medicine

## 2017-09-20 VITALS — BP 130/90 | HR 64 | Temp 98.1°F | Ht 72.0 in | Wt 279.8 lb

## 2017-09-20 DIAGNOSIS — E1159 Type 2 diabetes mellitus with other circulatory complications: Secondary | ICD-10-CM | POA: Insufficient documentation

## 2017-09-20 DIAGNOSIS — E669 Obesity, unspecified: Secondary | ICD-10-CM | POA: Diagnosis not present

## 2017-09-20 DIAGNOSIS — Z87448 Personal history of other diseases of urinary system: Secondary | ICD-10-CM | POA: Diagnosis not present

## 2017-09-20 DIAGNOSIS — I152 Hypertension secondary to endocrine disorders: Secondary | ICD-10-CM | POA: Insufficient documentation

## 2017-09-20 DIAGNOSIS — J069 Acute upper respiratory infection, unspecified: Secondary | ICD-10-CM

## 2017-09-20 DIAGNOSIS — I10 Essential (primary) hypertension: Secondary | ICD-10-CM

## 2017-09-20 NOTE — Progress Notes (Signed)
   Subjective:    Patient ID: Laurie Mckinney, female    DOB: [birth year], 56 y.o.   MRN: [id number]  HPI She complains of a 3-day history of nasal congestion, rhinorrhea, slight cough but no sore throat, earache, shortness of breath, fever or chills. She also has had a few blood pressure readings that were slightly elevated.  She is on medication at the present time.  She admits to not taking care of herself in terms of diet and exercise. She also recently saw her gynecologist and apparently did have hematuria however was not treated.   Review of Systems     Objective:   Physical Exam Alert and in no distress. Tympanic membranes and canals are normal. Pharyngeal area is normal. Neck is supple without adenopathy or thyromegaly. Cardiac exam shows a regular sinus rhythm without murmurs or gallops. Lungs are clear to auscultation. Urine microscopic was negative for red cells although the dipstick was positive.      Assessment & Plan:  Essential hypertension  Obesity (BMI 30-39.9)  Viral upper respiratory tract infection  History of hematuria Recommend supportive care for the URI. Discussed her blood pressure in regard to her present medication, increasing her exercise and also dietary modification to help with weight reduction.  She does plan on working on that. We will do a urine culture to further evaluate the urine to make sure nothing else is going on. Over 25 minutes, greater than 50% spent in counseling and coordination of care.

## 2017-09-22 LAB — CULTURE, URINE COMPREHENSIVE
MICRO NUMBER:: 81415259
SPECIMEN QUALITY:: ADEQUATE

## 2017-09-23 ENCOUNTER — Other Ambulatory Visit: Payer: Self-pay | Admitting: Family Medicine

## 2017-09-23 DIAGNOSIS — F341 Dysthymic disorder: Secondary | ICD-10-CM

## 2017-09-29 ENCOUNTER — Telehealth: Payer: Self-pay | Admitting: Family Medicine

## 2017-09-29 MED ORDER — HYDROCOD POLST-CPM POLST ER 10-8 MG/5ML PO SUER: 5.0000 mL | Freq: Two times a day (BID) | ORAL | 0 refills | Status: DC | PRN

## 2017-09-29 NOTE — Telephone Encounter (Signed)
Let her know that I called an antibiotic in.

## 2017-09-29 NOTE — Telephone Encounter (Signed)
Pt said she is still coughing pretty bad. Has some congestion but coughing is causing her to stay awake because she can not stop coughing when she lays down. Pt can somewhat maintain the cough while sitting up. Can she get cough med.

## 2017-09-30 MED ORDER — AMOXICILLIN 875 MG PO TABS: 875.0000 mg | ORAL_TABLET | Freq: Two times a day (BID) | ORAL | 0 refills | Status: DC

## 2017-09-30 NOTE — Addendum Note (Signed)
Addended by: Denita Lung on: 09/30/2017 01:25 PM   Modules accepted: Orders

## 2017-09-30 NOTE — Telephone Encounter (Signed)
Just wanted to verify- you called in a cough syrup, do we need an ABX in addition?

## 2017-10-24 ENCOUNTER — Other Ambulatory Visit: Payer: Self-pay | Admitting: Family Medicine

## 2017-10-25 NOTE — Telephone Encounter (Signed)
Can pt have a refill on meds  

## 2017-10-25 NOTE — Telephone Encounter (Signed)
Pt  Pharmacy is requesting Paxil Last ov is 09-20-17 for an acute visit and her physical was 01-21-17. Please advise. [facility]

## 2017-10-25 NOTE — Telephone Encounter (Signed)
She was last rx'd #90 with 3 refills in 01/2017.  This should last a full year. She needs to schedule CPE with Dr. Redmond School for April, and she should have enough to last until then

## 2018-01-07 ENCOUNTER — Telehealth: Payer: Self-pay | Admitting: Family Medicine

## 2018-01-07 ENCOUNTER — Other Ambulatory Visit: Payer: Self-pay

## 2018-01-07 DIAGNOSIS — I10 Essential (primary) hypertension: Secondary | ICD-10-CM

## 2018-01-07 MED ORDER — LISINOPRIL-HYDROCHLOROTHIAZIDE 10-12.5 MG PO TABS: 1.0000 | ORAL_TABLET | Freq: Every day | ORAL | 3 refills | Status: DC

## 2018-01-07 NOTE — Telephone Encounter (Signed)
error 

## 2018-01-07 NOTE — Telephone Encounter (Signed)
New Message   *STAT* If patient is at the pharmacy, call can be transferred to refill team.   1. Which medications need to be refilled? (please list name of each medication and dose if known)  lisinop/hctz tab 10-12.5 10-12.5mg  tablet once daily  Burpropion 100 mg tablet 12h once daily  2. Which pharmacy/location (including street and city if local pharmacy) is medication to be sent to? [facility] [place]  3. Do they need a 30 day or 90 day supply?  90 days supply

## 2018-01-13 ENCOUNTER — Other Ambulatory Visit: Payer: Self-pay

## 2018-01-13 DIAGNOSIS — F341 Dysthymic disorder: Secondary | ICD-10-CM

## 2018-01-13 DIAGNOSIS — I10 Essential (primary) hypertension: Secondary | ICD-10-CM

## 2018-01-13 MED ORDER — LISINOPRIL-HYDROCHLOROTHIAZIDE 10-12.5 MG PO TABS: 1.0000 | ORAL_TABLET | Freq: Every day | ORAL | 0 refills | Status: DC

## 2018-01-13 MED ORDER — BUPROPION HCL ER (SR) 100 MG PO TB12: 100.0000 mg | ORAL_TABLET | Freq: Every day | ORAL | 0 refills | Status: DC

## 2018-01-13 NOTE — Telephone Encounter (Signed)
[facility]

## 2018-01-13 NOTE — Telephone Encounter (Signed)
Pharmacy called stating patient will need a refill on her Wellbutrin. Please advise.

## 2018-01-13 NOTE — Telephone Encounter (Signed)
Have her set up a med management appointment

## 2018-01-13 NOTE — Telephone Encounter (Signed)
Called pt no answer lvm to call office to make a med mgt appt. [place] 01-13-18

## 2018-03-30 ENCOUNTER — Encounter: Payer: Self-pay | Admitting: Family Medicine

## 2018-03-31 ENCOUNTER — Other Ambulatory Visit: Payer: Self-pay | Admitting: Family Medicine

## 2018-03-31 DIAGNOSIS — F341 Dysthymic disorder: Secondary | ICD-10-CM

## 2018-03-31 NOTE — Telephone Encounter (Signed)
[facility] Is requesting to fill pt wellbutrin please advise [facility]

## 2018-04-01 ENCOUNTER — Other Ambulatory Visit: Payer: Self-pay | Admitting: Family Medicine

## 2018-04-01 NOTE — Telephone Encounter (Signed)
Is this ok to refill?  

## 2018-04-06 ENCOUNTER — Encounter: Payer: 59 | Admitting: Family Medicine

## 2018-04-12 ENCOUNTER — Encounter: Payer: Self-pay | Admitting: Medical

## 2018-04-12 ENCOUNTER — Ambulatory Visit (INDEPENDENT_AMBULATORY_CARE_PROVIDER_SITE_OTHER): Payer: 59 | Admitting: Medical

## 2018-04-12 ENCOUNTER — Ambulatory Visit
Admission: RE | Admit: 2018-04-12 | Discharge: 2018-04-12 | Disposition: A | Discharge status: Home / Self Care | Payer: 59 | Source: Ambulatory Visit | Attending: Medical | Admitting: Medical

## 2018-04-12 VITALS — BP 140/96 | HR 82 | Temp 99.8°F | Resp 18 | Ht 71.0 in | Wt 278.2 lb

## 2018-04-12 DIAGNOSIS — R059 Cough, unspecified: Secondary | ICD-10-CM

## 2018-04-12 DIAGNOSIS — J988 Other specified respiratory disorders: Secondary | ICD-10-CM

## 2018-04-12 DIAGNOSIS — R0602 Shortness of breath: Secondary | ICD-10-CM | POA: Diagnosis not present

## 2018-04-12 DIAGNOSIS — R05 Cough: Secondary | ICD-10-CM

## 2018-04-12 MED ORDER — ALBUTEROL SULFATE HFA 108 (90 BASE) MCG/ACT IN AERS: 2.0000 | INHALATION_SPRAY | Freq: Four times a day (QID) | RESPIRATORY_TRACT | 0 refills | Status: DC | PRN

## 2018-04-12 MED ORDER — AZITHROMYCIN 250 MG PO TABS: ORAL_TABLET | ORAL | 0 refills | Status: DC

## 2018-04-12 MED ORDER — HYDROCODONE-HOMATROPINE 5-1.5 MG/5ML PO SYRP: 5.0000 mL | ORAL_SOLUTION | Freq: Three times a day (TID) | ORAL | 0 refills | Status: AC | PRN

## 2018-04-12 NOTE — Progress Notes (Signed)
Subjective: Chief Complaint  Patient presents with  . URI    Cough, congestion, SOB X 2 weeks   Here for almost 2 weeks history of cough, shortness of breath, headache, rib pain, facial pain, sinus pressure, fatigue, head fullness not, fever up to 101 last week, some loose stool.  Using Mucinex DM, seem to be getting worse not better.  Denies history of asthma, non-smoker but does feel wheezy and short of breath.  Her husband was sick with similar illness before she caught it from him.  No other aggravating or relieving factors. No other complaint.  Past Medical History:  Diagnosis Date  . Adjustment insomnia    SHIFT WORK  . Allergy    RHINITIS  . Atrial flutter [facility]) 02/26/15   [facility] [place]  . Bursitis    right hip  . Migraine headache   . Mobitz type 2 second degree heart block   . Obesity    Current Outpatient Medications on File Prior to Visit  Medication Sig Dispense Refill  . acetaminophen (TYLENOL ARTHRITIS PAIN) 650 MG CR tablet Take 650 mg by mouth every 8 (eight) hours as needed for pain.    Marland Kitchen aspirin EC 81 MG tablet Take 81 mg by mouth daily.    Marland Kitchen buPROPion (WELLBUTRIN SR) 100 MG 12 hr tablet TAKE 1 TABLET DAILY 90 tablet 1  . lisinopril-hydrochlorothiazide (PRINZIDE,ZESTORETIC) 10-12.5 MG tablet Take 1 tablet by mouth daily. 90 tablet 0  . Multiple Vitamins-Minerals (MULTIVITAMIN WITH MINERALS) tablet Take 1 tablet by mouth every morning.     Marland Kitchen oxybutynin (DITROPAN-XL) 5 MG 24 hr tablet Take 5 mg by mouth daily.    Marland Kitchen PARoxetine (PAXIL) 20 MG tablet TAKE 1 TABLET BY MOUTH EVERY DAY 90 tablet 1  . magnesium gluconate (MAGONATE) 500 MG tablet Take 500 mg by mouth once.      No current facility-administered medications on file prior to visit.    ROS as in subjective   Objective: BP (!) 140/96   Pulse 82   Temp 99.8 F (37.7 C) (Oral)   Resp 18   Ht 5\' 11"  (1.803 m)   Wt 278 lb 3.2 oz (126.2 kg)   LMP 01/26/2011   SpO2 94%   BMI 38.80 kg/m    Wt Readings from Last 3 Encounters:  04/12/18 278 lb 3.2 oz (126.2 kg)  09/20/17 279 lb 12.8 oz (126.9 kg)  06/24/17 276 lb 6.4 oz (125.4 kg)   General appearance: Alert, WD/WN, no distress, ill appearing                             Skin: warm, no rash, no diaphoresis                           Head: + sinus tenderness                            Eyes: conjunctiva normal, corneas clear, PERRLA                            Ears: flat TMs, external ear canals normal                          Nose: septum midline, turbinates swollen, with erythema and clear discharge  Mouth/throat: MMM, tongue normal, mild pharyngeal erythema                           Neck: supple, no adenopathy, no thyromegaly, non tender                          Heart: RRR, normal S1, S2, no murmurs                         Lungs: +bronchial breath sounds, +scattered rhonchi, no wheezes, no rales                Extremities: no edema, non tender         Assessment: Encounter Diagnoses  Name Primary?  . Cough Yes  . SOB (shortness of breath)   . Respiratory tract infection      Plan: She will go for chest x-ray.  Pneumonia suspected.  Begin medication as below, rest, hydrate well, follow-up pending CXR  Cerria was seen today for uri.  Diagnoses and all orders for this visit:  Cough -     DG Chest 2 View; Future -     Pulse oximetry (single); Future  SOB (shortness of breath) -     DG Chest 2 View; Future -     Pulse oximetry (single); Future  Respiratory tract infection -     DG Chest 2 View; Future -     Pulse oximetry (single); Future  Other orders -     azithromycin (ZITHROMAX) 250 MG tablet; 2 tablets day 1, then 1 tablet days 2-4 -     albuterol (PROVENTIL HFA;VENTOLIN HFA) 108 (90 Base) MCG/ACT inhaler; Inhale 2 puffs into the lungs every 6 (six) hours as needed for wheezing or shortness of breath. -     HYDROcodone-homatropine (HYCODAN) 5-1.5 MG/5ML syrup; Take 5 mLs by mouth every 8  (eight) hours as needed for up to 5 days for cough.

## 2018-04-13 ENCOUNTER — Other Ambulatory Visit: Payer: Self-pay

## 2018-04-13 DIAGNOSIS — J189 Pneumonia, unspecified organism: Secondary | ICD-10-CM

## 2018-04-17 ENCOUNTER — Other Ambulatory Visit: Payer: Self-pay | Admitting: Family Medicine

## 2018-04-17 DIAGNOSIS — I10 Essential (primary) hypertension: Secondary | ICD-10-CM

## 2018-04-23 ENCOUNTER — Telehealth: Payer: 59 | Admitting: Physician Assistant

## 2018-04-23 DIAGNOSIS — L255 Unspecified contact dermatitis due to plants, except food: Secondary | ICD-10-CM

## 2018-04-23 MED ORDER — PREDNISONE 10 MG (21) PO TBPK
ORAL_TABLET | ORAL | 0 refills | Status: DC
Start: 2018-04-23 — End: 2018-07-18

## 2018-04-23 MED ORDER — DOXYCYCLINE HYCLATE 100 MG PO CAPS: 100.0000 mg | ORAL_CAPSULE | Freq: Two times a day (BID) | ORAL | 0 refills | Status: DC

## 2018-04-23 NOTE — Progress Notes (Signed)
We are sorry that you are not feeing well.  Here is how we plan to help!  Based on what you have shared with me it looks like you have had an allergic reaction to the oily resin from a group of plants.  This resin is very sticky, so it easily attaches to your skin, clothing, tools equipment, and pet's fur.    This blistering rash is often called poison ivy rash although it can come from contact with the leaves, stems and roots of poison ivy, poison oak and poison sumac.  The oily resin contains urushiol (u-ROO-she-ol) that produces a skin rash on exposed skin.  The severity of the rash depends on the amount of urushiol that gets on your skin.  A section of skin with more urushiol on it may develop a rash sooner.  The rash usually develops 12-48 hours after exposure and can last two to three weeks.  Your skin must come in direct contact with the plant's oil to be affected.  Blister fluid doesn't spread the rash.  However, if you come into contact with a piece of clothing or pet fur that has urushiol on it, the rash may spread out.  You can also transfer the oil to other parts of your body with your fingers.  Often the rash looks like a straight line because of the way the plant brushes against your skin.    I have developed the following plan to treat your condition I suspect that a bacterial infection has developed at the rash site, and I have given you both an oral steroid and an oral antibiotic.  I have sent a prednisone dose pack to your chosen pharmacy. Be sure to follow the instructions carefully and complete the entire prescription.  You may use Benadryl or Caladryl topical lotions to sooth the itch and remember cool, not hot, showers and baths can help relieve the itching!  I have also sent in a prescription for an antibiotic: doxycycline 100 mg twice a day for 10 days.   Please schedule an appointment with your primary care provider to follow up on your skin infection within 2 weeks.  If drainage  and red streaking continue to spread you should be seen urgently.  Make sure that the clothes you were wearing and any towels or sheets that may have come in contact with the oil (urushiol) are washed in detergent and hot water.      What can you do to prevent this rash?  Avoid the plants.  Learn how to identify poison ivy, poison oak and poison sumac in all seasons.  When hiking or engaging in other activities that might expose you to these plants, try to stay on cleared pathways.  If camping, make sure you pitch your tent in an area free of these plants.  Keep pets from running through wooded areas so that urushiol doesn't accidentally stick to their fur, which you may touch.  Remove or kill the plants.  In your yard, you can get rid of poison ivy by applying an herbicide or pulling it out of the ground, including the roots, while wearing heavy gloves.  Afterward remove the gloves and thoroughly wash them and your hands.  Don't burn poison ivy or related plants because the urushiol can be carried by smoke.  Wear protective clothing.  If needed, protect your skin by wearing socks, boots, pants, long sleeves and vinyl gloves.  Wash your skin right away.  Washing off the oil with   soap and water within 30 minutes of exposure may reduce your chances of getting a poison ivy rash.  Even washing after an hour or so can help reduce the severity of the rash.  If you walk through some poison ivy and then later touch your shoes, you may get some urushiol on your hands, which may then transfer to your face or body by touching or rubbing.  If the contaminated object isn't cleaned, the urushiol on it can still cause a skin reaction years later.    Be careful not to reuse towels after you have washed your skin.  Also carefully wash clothing in detergent and hot water to remove all traces of the oil.  Handle contaminated clothing carefully so you don't transfer the urushiol to yourself, furniture, rugs or  appliances.  Remember that pets can carry the oil on their fur and paws.  If you think your pet may be contaminated with urushiol, put on some long rubber gloves and give your pet a bath.  Finally, be careful not to burn these plants as the smoke can contain traces of the oil.  Inhaling the smoke may result in difficulty breathing. If that occurred you should see a physician as soon as possible.  See your doctor right away if:   The reaction is severe or widespread  You inhaled the smoke from burning poison ivy and are having difficulty breathing  Your skin continues to swell  The rash affects your eyes, mouth or genitals  Blisters are oozing pus  You develop a fever greater than 100 F (37.8 C)  The rash doesn't get better within a few weeks.  If you scratch the poison ivy rash, bacteria under your fingernails may cause the skin to become infected.  See your doctor if pus starts oozing from the blisters.  Treatment generally includes antibiotics.  Poison ivy treatments are usually limited to self-care methods.  And the rash typically goes away on its own in two to three weeks.     If the rash is widespread or results in a large number of blisters, your doctor may prescribe an oral corticosteroid, such as prednisone.  If a bacterial infection has developed at the rash site, your doctor may give you a prescription for an oral antibiotic.  MAKE SURE YOU   Understand these instructions.  Will watch your condition.  Will get help right away if you are not doing well or get worse.  Thank you for choosing an e-visit. Your e-visit answers were reviewed by a board certified advanced clinical practitioner to complete your personal care plan. Depending upon the condition, your plan could have included both over the counter or prescription medications.  Please review your pharmacy choice. If there is a problem you may use MyChart messaging to have the prescription routed to another  pharmacy.   Your safety is important to us. If you have drug allergies check your prescription carefully.  You can use MyChart to ask questions about today's visit, request a non-urgent call back, or ask for a work or school excuse for 24 hours related to this e-Visit. If it has been greater than 24 hours you will need to follow up with your provider, or enter a new e-Visit to address those concerns.   You will get an email in the next two days asking about your experience. I hope that your e-visit has been valuable and will speed your recovery Thank you for choosing an e-visit.      

## 2018-05-05 ENCOUNTER — Encounter: Payer: Self-pay | Admitting: Family Medicine

## 2018-05-05 ENCOUNTER — Ambulatory Visit (INDEPENDENT_AMBULATORY_CARE_PROVIDER_SITE_OTHER): Payer: 59 | Admitting: Family Medicine

## 2018-05-05 VITALS — BP 122/84 | HR 58 | Temp 98.0°F | Wt 280.6 lb

## 2018-05-05 DIAGNOSIS — Z8542 Personal history of malignant neoplasm of other parts of uterus: Secondary | ICD-10-CM

## 2018-05-05 DIAGNOSIS — I10 Essential (primary) hypertension: Secondary | ICD-10-CM

## 2018-05-05 DIAGNOSIS — J301 Allergic rhinitis due to pollen: Secondary | ICD-10-CM

## 2018-05-05 DIAGNOSIS — M545 Low back pain: Secondary | ICD-10-CM

## 2018-05-05 DIAGNOSIS — I441 Atrioventricular block, second degree: Secondary | ICD-10-CM

## 2018-05-05 DIAGNOSIS — R32 Unspecified urinary incontinence: Secondary | ICD-10-CM

## 2018-05-05 DIAGNOSIS — F341 Dysthymic disorder: Secondary | ICD-10-CM

## 2018-05-05 DIAGNOSIS — Z8669 Personal history of other diseases of the nervous system and sense organs: Secondary | ICD-10-CM

## 2018-05-05 DIAGNOSIS — Z23 Encounter for immunization: Secondary | ICD-10-CM

## 2018-05-05 DIAGNOSIS — E669 Obesity, unspecified: Secondary | ICD-10-CM

## 2018-05-05 DIAGNOSIS — G8929 Other chronic pain: Secondary | ICD-10-CM

## 2018-05-05 DIAGNOSIS — Z0000 Encounter for general adult medical examination without abnormal findings: Secondary | ICD-10-CM

## 2018-05-05 DIAGNOSIS — L570 Actinic keratosis: Secondary | ICD-10-CM

## 2018-05-05 MED ORDER — BUPROPION HCL ER (SR) 100 MG PO TB12: 100.0000 mg | ORAL_TABLET | Freq: Every day | ORAL | 3 refills | Status: DC

## 2018-05-05 MED ORDER — PAROXETINE HCL 20 MG PO TABS: 20.0000 mg | ORAL_TABLET | Freq: Every day | ORAL | 3 refills | Status: DC

## 2018-05-05 MED ORDER — LISINOPRIL-HYDROCHLOROTHIAZIDE 10-12.5 MG PO TABS: 1.0000 | ORAL_TABLET | Freq: Every day | ORAL | 3 refills | Status: DC

## 2018-05-05 MED ORDER — OXYBUTYNIN CHLORIDE ER 10 MG PO TB24: 10.0000 mg | ORAL_TABLET | Freq: Every day | ORAL | 3 refills | Status: DC

## 2018-05-05 NOTE — Progress Notes (Signed)
Subjective:    Patient ID: Laurie Mckinney, female    DOB: [birth year], 59 y.o.   MRN: [id number]  HPI She is here for a complete examination.  She has a long history of low back pain and notes that this is especially worse when she leans forward in the kitchen while doing dishes or other activities.  Goes away very quickly after that.  She continues to deal with depression.  Lately she has been under a lot of stress to her spine is been losing his job as well as other household related issues.  She is considering getting counseling on this.  She does have a history of endometrial cancer and has a head arrest to me.  She is followed regularly by her GYN as well as her Psychologist, sport and exercise.  She continues on her lisinopril/HCTZ without difficulty.  She is also using oxybutynin but it is not controlling her bladder symptoms as well as she would like.  She is not having any difficulty with her migraine headaches.  Does have a history of actinic keratoses but none of any significance today.  Review of record also indicates a history of second-degree heart block Mobitz type I. S are considering joining a gym.  He and her husband are considering joining the gym.  Family and social history as well as health maintenance and immunizations was reviewed   Review of Systems  All other systems reviewed and are negative.      Objective:   Physical Exam BP 122/84 (BP Location: Left Arm, Patient Position: Sitting)   Pulse (!) 58   Temp 98 F (36.7 C)   Wt 280 lb 9.6 oz (127.3 kg)   LMP 01/26/2011   SpO2 97%   BMI 39.14 kg/m   General Appearance:    Alert, cooperative, no distress, appears stated age  Head:    Normocephalic, without obvious abnormality, atraumatic  Eyes:    PERRL, conjunctiva/corneas clear, EOM's intact, fundi    benign  Ears:    Normal TM's and external ear canals  Nose:   Nares normal, mucosa normal, no drainage or sinus   tenderness  Throat:   Lips, mucosa, and tongue normal; teeth and gums  normal  Neck:   Supple, no lymphadenopathy;  thyroid:  no   enlargement/tenderness/nodules; no carotid   bruit or JVD  Back:    Spine nontender, no curvature, ROM normal, no CVA     tenderness  Lungs:     Clear to auscultation bilaterally without wheezes, rales or     ronchi; respirations unlabored      Heart:    Regular rate and rhythm, S1 and S2 normal, no murmur, rub   or gallop  Breast Exam:    Deferred to GYN  Abdomen:     Soft, non-tender, nondistended, normoactive bowel sounds,    no masses, no hepatosplenomegaly  Genitalia:    Deferred to GYN     Extremities:   No clubbing, cyanosis or edema  Pulses:   2+ and symmetric all extremities  Skin:   Skin color, texture, turgor normal, no rashes or lesions  Lymph nodes:   Cervical, supraclavicular, and axillary nodes normal  Neurologic:   CNII-XII intact, normal strength, sensation and gait; reflexes 2+ and symmetric throughout          Psych:   Normal mood, affect, hygiene and grooming.          Assessment & Plan:  Routine general medical examination at a health care  facility - Plan: CBC with Differential/Platelet, Comprehensive metabolic panel, Lipid panel  Need for shingles vaccine - Plan: Varicella-zoster vaccine IM (Shingrix)  Allergic rhinitis due to pollen, unspecified seasonality  Dysthymia - Plan: PARoxetine (PAXIL) 20 MG tablet, buPROPion (WELLBUTRIN SR) 100 MG 12 hr tablet  History of endometrial cancer  Actinic keratosis  Essential hypertension - Plan: CBC with Differential/Platelet, Comprehensive metabolic panel, lisinopril-hydrochlorothiazide (PRINZIDE,ZESTORETIC) 10-12.5 MG tablet  History of migraine headaches  Obesity (BMI 30-39.9) - Plan: CBC with Differential/Platelet, Comprehensive metabolic panel, Lipid panel  Urinary incontinence, unspecified type - Plan: oxybutynin (DITROPAN XL) 10 MG 24 hr tablet  Second degree heart block Discussed proper back care with her demonstrating proper posturing.   Discussed possibly sending her to physical therapy that she is not interested.  We will increase her Ditropan to the next higher level she will call me in a month to let me know how she is doing.  She will continue to be followed by her surgeon but did discuss the fact that gynecologic evaluation is probably not necessary at this point.  Her other medications were reviewed.  I strongly encouraged her to get involved in counseling to help deal with the stresses that she is under.

## 2018-05-06 ENCOUNTER — Other Ambulatory Visit: Payer: Self-pay | Admitting: Medical

## 2018-05-06 LAB — CBC WITH DIFFERENTIAL/PLATELET
BASOS: 0 %
Basophils Absolute: 0 10*3/uL (ref 0.0–0.2)
EOS (ABSOLUTE): 0.1 10*3/uL (ref 0.0–0.4)
Eos: 1 %
HEMOGLOBIN: 13.9 g/dL (ref 11.1–15.9)
Hematocrit: 42.9 % (ref 34.0–46.6)
IMMATURE GRANS (ABS): 0 10*3/uL (ref 0.0–0.1)
IMMATURE GRANULOCYTES: 0 %
LYMPHS: 26 %
Lymphocytes Absolute: 2.2 10*3/uL (ref 0.7–3.1)
MCH: 30.3 pg (ref 26.6–33.0)
MCHC: 32.4 g/dL (ref 31.5–35.7)
MCV: 94 fL (ref 79–97)
MONOCYTES: 9 %
Monocytes Absolute: 0.8 10*3/uL (ref 0.1–0.9)
NEUTROS ABS: 5.4 10*3/uL (ref 1.4–7.0)
NEUTROS PCT: 64 %
PLATELETS: 311 10*3/uL (ref 150–450)
RBC: 4.59 x10E6/uL (ref 3.77–5.28)
RDW: 13.9 % (ref 12.3–15.4)
WBC: 8.6 10*3/uL (ref 3.4–10.8)

## 2018-05-06 LAB — COMPREHENSIVE METABOLIC PANEL
ALBUMIN: 3.8 g/dL (ref 3.5–5.5)
ALT: 23 IU/L (ref 0–32)
AST: 26 IU/L (ref 0–40)
Albumin/Globulin Ratio: 1.2 (ref 1.2–2.2)
Alkaline Phosphatase: 69 IU/L (ref 39–117)
BILIRUBIN TOTAL: 0.5 mg/dL (ref 0.0–1.2)
BUN / CREAT RATIO: 14 (ref 9–23)
BUN: 10 mg/dL (ref 6–24)
CALCIUM: 9.4 mg/dL (ref 8.7–10.2)
CO2: 24 mmol/L (ref 20–29)
Chloride: 101 mmol/L (ref 96–106)
Creatinine, Ser: 0.71 mg/dL (ref 0.57–1.00)
GFR, EST AFRICAN AMERICAN: 108 mL/min/{1.73_m2} (ref >59)
GFR, EST NON AFRICAN AMERICAN: 94 mL/min/{1.73_m2} (ref >59)
GLUCOSE: 117 mg/dL — AB (ref 65–99)
Globulin, Total: 3.2 g/dL (ref 1.5–4.5)
Potassium: 5 mmol/L (ref 3.5–5.2)
Sodium: 140 mmol/L (ref 134–144)
TOTAL PROTEIN: 7 g/dL (ref 6.0–8.5)

## 2018-05-06 LAB — LIPID PANEL
CHOL/HDL RATIO: 2.5 ratio (ref 0.0–4.4)
Cholesterol, Total: 131 mg/dL (ref 100–199)
HDL: 52 mg/dL (ref >39)
LDL Calculated: 68 mg/dL (ref 0–99)
Triglycerides: 56 mg/dL (ref 0–149)
VLDL CHOLESTEROL CAL: 11 mg/dL (ref 5–40)

## 2018-05-06 NOTE — Telephone Encounter (Signed)
Is this ok to refill?  

## 2018-05-11 LAB — HGB A1C W/O EAG: Hgb A1c MFr Bld: 6.6 % — ABNORMAL HIGH (ref 4.8–5.6)

## 2018-05-11 LAB — SPECIMEN STATUS REPORT

## 2018-05-13 ENCOUNTER — Encounter: Payer: Self-pay | Admitting: Family Medicine

## 2018-05-13 ENCOUNTER — Ambulatory Visit (INDEPENDENT_AMBULATORY_CARE_PROVIDER_SITE_OTHER): Payer: 59 | Admitting: Family Medicine

## 2018-05-13 VITALS — BP 128/86 | HR 67 | Temp 97.8°F | Wt 278.8 lb

## 2018-05-13 DIAGNOSIS — E119 Type 2 diabetes mellitus without complications: Secondary | ICD-10-CM | POA: Diagnosis not present

## 2018-05-13 NOTE — Progress Notes (Signed)
   Subjective:    Patient ID: Laurie Mckinney, female    DOB: November 30, 1960, 57 y.o.   MRN: [id number]  HPI She is here for consultation concerning recent diagnosis of diabetes.  Her most recent hemoglobin A1c is 6.6.   Review of Systems     Objective:   Physical Exam Alert and in no distress otherwise not examined      Assessment & Plan:  New onset type 2 diabetes mellitus ([place]) - Plan: Amb Referral to Nutrition and Diabetic E Discussed the diagnosis of diabetes with her in detail.  Explained that this is a spectrum of disease and she is at the very beginning of this.  Discussed the need for her to make diet and exercise changes.  Discussed the fact that her body can no longer maintain blood sugars within the range and she will have to take over this.  Discussed foot care, eye care, risk for stroke, blindness, heart disease, renal and neurologic complications.  Recommend checking her blood sugar is before or 2 hours after a meal.  Will refer to nutrition counseling for this.  Discussed the need for her to reduce her weight down to roughly size 14 dress.  Recheck here in 4 months.  Over 25 minutes, the entire time spent in counseling and coordination of care.

## 2018-05-13 NOTE — Patient Instructions (Addendum)
Go to the American diabetes Association website or Familydoctor.org Check your blood sugars periodically 2 hours after meals

## 2018-06-15 ENCOUNTER — Encounter: Payer: Self-pay | Admitting: Family Medicine

## 2018-06-15 ENCOUNTER — Other Ambulatory Visit: Payer: Self-pay

## 2018-06-16 ENCOUNTER — Other Ambulatory Visit: Payer: Self-pay

## 2018-06-16 DIAGNOSIS — E119 Type 2 diabetes mellitus without complications: Secondary | ICD-10-CM

## 2018-06-16 MED ORDER — ONETOUCH DELICA LANCETS 33G MISC: 1.0000 | 5 refills | Status: AC

## 2018-06-16 MED ORDER — GLUCOSE BLOOD VI STRP: ORAL_STRIP | 12 refills | Status: DC

## 2018-06-16 NOTE — Progress Notes (Signed)
New on set for Diabetes testing supplies. [place]

## 2018-06-17 ENCOUNTER — Telehealth: Payer: Self-pay | Admitting: Family Medicine

## 2018-06-17 ENCOUNTER — Other Ambulatory Visit: Payer: Self-pay

## 2018-06-17 NOTE — Telephone Encounter (Signed)
Recv'd P.A. for One Touch Verio strips, preferred alternatives are Acucheck plus and smartview and they have both in stock.  Will you switch pt to Acucheck plus and sent into [facility], they could not take a verbal due to Medicare guidelines.

## 2018-06-20 ENCOUNTER — Telehealth: Payer: Self-pay | Admitting: *Deleted

## 2018-06-20 NOTE — Telephone Encounter (Signed)
Attempted to call the patient, left a message to call the office back to schedule her appt

## 2018-06-22 ENCOUNTER — Telehealth: Payer: Self-pay

## 2018-06-22 NOTE — Telephone Encounter (Signed)
Pt was called to advise a meter will be placed up front for her and also to advise that her appt with diabetes and Nutrition was scheduled for 07-27-18.

## 2018-06-24 ENCOUNTER — Other Ambulatory Visit: Payer: Self-pay | Admitting: Family Medicine

## 2018-06-24 ENCOUNTER — Other Ambulatory Visit: Payer: Self-pay

## 2018-06-24 MED ORDER — ACCU-CHEK SOFT TOUCH LANCETS MISC: 12 refills | Status: DC

## 2018-06-24 MED ORDER — GLUCOSE BLOOD VI STRP: ORAL_STRIP | 12 refills | Status: DC

## 2018-06-24 NOTE — Telephone Encounter (Signed)
Laurie Mckinney got pt a new meter and informed pt

## 2018-06-24 NOTE — Telephone Encounter (Signed)
SENT IN ACCU CHECK LANCETS AND TEST STRIPS TO [facility] FOR PT. ALSO LEFT A METER UP FRONT FOR HER TO PICK. [place]

## 2018-06-27 ENCOUNTER — Encounter: Payer: Self-pay | Admitting: Family Medicine

## 2018-07-18 ENCOUNTER — Inpatient Hospital Stay: Payer: 59 | Attending: Gynecologic Oncology | Admitting: Gynecologic Oncology

## 2018-07-18 ENCOUNTER — Encounter: Payer: Self-pay | Admitting: Gynecologic Oncology

## 2018-07-18 VITALS — BP 136/82 | HR 66 | Temp 98.6°F | Resp 18 | Ht 71.0 in | Wt 285.7 lb

## 2018-07-18 DIAGNOSIS — Z90710 Acquired absence of both cervix and uterus: Secondary | ICD-10-CM | POA: Diagnosis not present

## 2018-07-18 DIAGNOSIS — N952 Postmenopausal atrophic vaginitis: Secondary | ICD-10-CM

## 2018-07-18 DIAGNOSIS — C541 Malignant neoplasm of endometrium: Secondary | ICD-10-CM | POA: Insufficient documentation

## 2018-07-18 DIAGNOSIS — Z90722 Acquired absence of ovaries, bilateral: Secondary | ICD-10-CM | POA: Insufficient documentation

## 2018-07-18 MED ORDER — ESTROGENS, CONJUGATED 0.625 MG/GM VA CREA: 1.0000 | TOPICAL_CREAM | VAGINAL | 12 refills | Status: DC

## 2018-07-18 NOTE — Progress Notes (Signed)
FOLLOWUP VISIT ENDOMETRIAL CANCER Chief Complaint  Patient presents with  . Endometrial cancer [facility])     Assessment:    57 y.o. year old with history of Stage IA Grade 1 endometrioid endometrial cancer.   S/p robotic hysterectomy, BSO on 02/05/15. Low risk features on pathology, therefore no adjuvant therapy recommended. No evidence for disease recurrence on today's exam.  Vulvovaginal atrophy and dyspareunia.   Plan: Followup exam with Dr Ulanda Edison in April, 2020 and with me in October, 2020. Recommend continued 6 monthly surveillance until June, 2021.  Prescribed premarin cream 3 times per week. Discussed that data supports its safe use after endometrial cancer history.   HPI:  Laurie Mckinney is a 57 y.o. year old initially seen in consultation on 01/07/15 for complex atypical hyperplasia, referred by Dr Ulanda Edison.  She then underwent a robotic hysterectomy, BSO on 10/06/85 without complications.  Her postoperative course was uncomplicated. An occult endometrial cancer was identified on final pathology.  Her final pathologic diagnosis is a Stage IA Grade 1 endometrioid endometrial cancer with no lymphovascular space invasion, 2/22 mm (10%) of myometrial invasion. Lymph nodes were not sampled.  She had bleeding from granulation tissue at the vaginal cuff removed in December 2016.  INTERVAL HX: The patient reports no further episodes of vaginal spotting.  She denies other symptoms concerning for recurrence. She does endorse dyspareunia with thin vaginal skin and dryness.   No Known Allergies Current Outpatient Medications on File Prior to Visit  Medication Sig Dispense Refill  . aspirin EC 81 MG tablet Take 81 mg by mouth daily.    Marland Kitchen buPROPion (WELLBUTRIN SR) 100 MG 12 hr tablet Take 1 tablet (100 mg total) by mouth daily. 90 tablet 3  . glucose blood (ONE TOUCH ULTRA TEST) test strip USE AS INSTRUCTED 100 each 12  . Lancets (ACCU-CHEK SOFT TOUCH) lancets Use as instructed 100 each 12  .  lisinopril-hydrochlorothiazide (PRINZIDE,ZESTORETIC) 10-12.5 MG tablet Take 1 tablet by mouth daily. 90 tablet 3  . Multiple Vitamins-Minerals (MULTIVITAMIN WITH MINERALS) tablet Take 1 tablet by mouth every morning.     Marland Kitchen oxybutynin (DITROPAN XL) 10 MG 24 hr tablet Take 1 tablet (10 mg total) by mouth at bedtime. 90 tablet 3  . PARoxetine (PAXIL) 20 MG tablet Take 1 tablet (20 mg total) by mouth daily. 90 tablet 3  . acetaminophen (TYLENOL ARTHRITIS PAIN) 650 MG CR tablet Take 650 mg by mouth every 8 (eight) hours as needed for pain.    Marland Kitchen albuterol (PROVENTIL HFA;VENTOLIN HFA) 108 (90 Base) MCG/ACT inhaler TAKE 2 PUFFS BY MOUTH EVERY 6 HOURS AS NEEDED FOR WHEEZE OR SHORTNESS OF BREATH (Patient not taking: Reported on 07/18/2018) 6.7 Inhaler 0  . magnesium gluconate (MAGONATE) 500 MG tablet Take 500 mg by mouth once.      No current facility-administered medications on file prior to visit.    Past Medical History:  Diagnosis Date  . Adjustment insomnia    SHIFT WORK  . Allergy    RHINITIS  . Atrial flutter [facility]) 02/26/15   [facility]  . Bursitis    right hip  . Migraine headache   . Mobitz type 2 second degree heart block   . Obesity    Past Surgical History:  Procedure Laterality Date  . COLONOSCOPY  2011   Dr.Brodie  . ROBOTIC ASSISTED TOTAL HYSTERECTOMY WITH BILATERAL SALPINGO OOPHERECTOMY Bilateral 02/05/2015   Procedure: ROBOTIC ASSISTED TOTAL LAPAROSCOPIC HYSTERECTOMY WITH BILATERAL SALPINGO OOPHORECTOMY;  Surgeon: Everitt Amber,  MD;  Location: [facility];  Service: Gynecology;  Laterality: Bilateral;   Family History  Problem Relation Age of Onset  . Arthritis Mother   . Heart disease Father    Social History   Socioeconomic History  . Marital status: Married    Spouse name: Not on file  . Number of children: Not on file  . Years of education: Not on file  . Highest education level: Not on file  Occupational History  . Not on file  Social Needs  .  Financial resource strain: Not on file  . Food insecurity:    Worry: Not on file    Inability: Not on file  . Transportation needs:    Medical: Not on file    Non-medical: Not on file  Tobacco Use  . Smoking status: Never Smoker  . Smokeless tobacco: Never Used  Substance and Sexual Activity  . Alcohol use: No  . Drug use: No  . Sexual activity: Yes  Lifestyle  . Physical activity:    Days per week: Not on file    Minutes per session: Not on file  . Stress: Not on file  Relationships  . Social connections:    Talks on phone: Not on file    Gets together: Not on file    Attends religious service: Not on file    Active member of club or organization: Not on file    Attends meetings of clubs or organizations: Not on file    Relationship status: Not on file  . Intimate partner violence:    Fear of current or ex partner: Not on file    Emotionally abused: Not on file    Physically abused: Not on file    Forced sexual activity: Not on file  Other Topics Concern  . Not on file  Social History Narrative  . Not on file     Review of systems: Constitutional:  She has no weight gain or weight loss. She has no fever or chills. Eyes: No blurred vision Ears, Nose, Mouth, Throat: No dizziness, headaches or changes in hearing. No mouth sores. Cardiovascular: No chest pain, palpitations or edema. Respiratory:  No shortness of breath, wheezing or cough Gastrointestinal: She has normal bowel movements without diarrhea or constipation. She denies any nausea or vomiting. She denies blood in her stool or heart burn. Genitourinary:  She denies pelvic pain, pelvic pressure. She has no hematuria, dysuria, or incontinence. She has no irregular vaginal bleeding or vaginal discharge. + urinary incontinence. Musculoskeletal: Denies muscle weakness or joint pains.  Skin:  She has no skin changes, rashes or itching Neurological:  Denies dizziness or headaches. No neuropathy, no numbness or  tingling. Psychiatric:  She denies depression or anxiety. Hematologic/Lymphatic:   No easy bruising or bleeding   Physical Exam: Blood pressure 136/82, pulse 66, temperature 98.6 F (37 C), temperature source Oral, resp. rate 18, height 5\' 11"  (1.803 m), weight 285 lb 11.2 oz (129.6 kg), last menstrual period 01/26/2011, SpO2 94 %. General: Well dressed, well nourished in no apparent distress.   HEENT:  Normocephalic and atraumatic, no lesions.  Extraocular muscles intact. Sclerae anicteric. Pupils equal, round, reactive. No mouth sores or ulcers. Thyroid is normal size, not nodular, midline. Skin:  No lesions or rashes. Breasts:  deferred Lungs: deferred Cardiovascular:  deferred Abdomen:  Soft, nontender, nondistended.  No palpable masses.  No hepatosplenomegaly.  No ascites. Normal bowel sounds.  No hernias.  Incisions are well healed Genitourinary: Normal EGBUS  Vaginal cuff intact.  No bleeding or discharge.  No cul de sac fullness. Normal cuff. Extremities: No cyanosis, clubbing or edema.  No calf tenderness or erythema. No palpable cords. Psychiatric: Mood and affect are appropriate. Neurological: Awake, alert and oriented x 3. Sensation is intact, no neuropathy.  Musculoskeletal: No pain, normal strength and range of motion.  Thereasa Solo, MD

## 2018-07-18 NOTE — Patient Instructions (Addendum)
Please notify Dr Denman George at phone number [phone] if you notice vaginal bleeding, new pelvic or abdominal pains, bloating, feeling full easy, or a change in bladder or bowel function.   Please return to see Dr Denman George in October of 2020. Her schedule for this time should be available after May, 2020.   Take premarin cream (vaginally) three times a week applied inside the vagina at night.

## 2018-07-19 ENCOUNTER — Other Ambulatory Visit (INDEPENDENT_AMBULATORY_CARE_PROVIDER_SITE_OTHER): Payer: 59

## 2018-07-19 DIAGNOSIS — Z23 Encounter for immunization: Secondary | ICD-10-CM | POA: Diagnosis not present

## 2018-07-22 LAB — HM DIABETES EYE EXAM

## 2018-07-27 ENCOUNTER — Encounter: Payer: Self-pay | Admitting: Nutrition

## 2018-07-27 ENCOUNTER — Encounter: Payer: 59 | Attending: Family Medicine | Admitting: Nutrition

## 2018-07-27 VITALS — Ht 71.0 in | Wt 285.0 lb

## 2018-07-27 DIAGNOSIS — E119 Type 2 diabetes mellitus without complications: Secondary | ICD-10-CM | POA: Diagnosis present

## 2018-07-27 DIAGNOSIS — E669 Obesity, unspecified: Secondary | ICD-10-CM

## 2018-07-27 DIAGNOSIS — Z713 Dietary counseling and surveillance: Secondary | ICD-10-CM | POA: Diagnosis not present

## 2018-07-27 DIAGNOSIS — E118 Type 2 diabetes mellitus with unspecified complications: Secondary | ICD-10-CM

## 2018-07-27 NOTE — Progress Notes (Signed)
Medical Nutrition Therapy:  Appt start time: 1000 end time:  1100.   Assessment:  Primary concerns today  : Diabetes Type. Lives with husband. She does the cooking and shopping.. Eats 2-3 meals per day. Recently has been diagnosed with depression from her husband losing his job.  Works for [employer]. She works third shift 10 pm  3 am. Gained 15 lbs in the last year.  Feeling better with her depression with some medications. She has been praying/ listening podcasts, mediation to help with stress. Wants to lose weight. Is going to try diet and exercise first to help lower A1C and improve health. Engaged to make lifestyle changes. Current diet inconsistent to meet her needs. Testing blood sugars once a day. FBS 120's-130's. Biggest issues is her work schedule and didn't know about how to plan her meals due to work times. Not exercising but willing to start. . Vitals with BMI 07/27/2018  Height _0   Weight 285 lbs  BMI 49.44  Systolic   Diastolic   Pulse   Respirations     Lab Results  Component Value Date   HGBA1C 6.6 (H) 05/05/2018   Lipid Panel     Component Value Date/Time   CHOL 131 05/05/2018 1448   TRIG 56 05/05/2018 1448   HDL 52 05/05/2018 1448   CHOLHDL 2.5 05/05/2018 1448   CHOLHDL 2.5 01/21/2017 0921   VLDL 11 01/21/2017 0921   LDLCALC 68 05/05/2018 1448   CMP Latest Ref Rng & Units 05/05/2018 01/21/2017 01/10/2016  Glucose 65 - 99 mg/dL 117(H) 110(H) 77  BUN 6 - 24 mg/dL _1 Creatinine 0.57 - 1.00 mg/dL 0.71 0.81 0.58  Sodium 134 - 144 mmol/L 140 139 139  Potassium 3.5 - 5.2 mmol/L 5.0 4.4 4.3  Chloride 96 - 106 mmol/L 101 105 104  CO2 20 - 29 mmol/L _2 Calcium 8.7 - 10.2 mg/dL 9.4 9.2 9.3  Total Protein 6.0 - 8.5 g/dL 7.0 7.2 7.4  Total Bilirubin 0.0 - 1.2 mg/dL 0.5 0.6 0.4  Alkaline Phos 39 - 117 IU/L 69 62 71  AST 0 - 40 IU/L _3 ALT 0 - 32 IU/L _4 Preferred Learning Style:  No preference indicated   Learning  Readiness:  Ready  Change in progress   MEDICATIONS:   DIETARY INTAKE:  24-hr recall:  2 pm bran cereal with berries and skim milk,  6pm left overs: stuffed bell peppers, pizza, mac/cheese, green beans/corn,  Coffee, Diet cokes 1 a day Water 2 am; leftovers 5 am another bowl of cereal with mixed fruit.   Usual physical activity:  Yard work,   Estimated energy needs: 1500  calories 170  g carbohydrates 112 g protein 42 g fat  Progress Towards Goal(s):  In progress.   Nutritional Diagnosis:  NB-1.1 Food and nutrition-related knowledge deficit As related to Diabetes Type 2 and Obesity.  As evidenced by A1C 6.6 and BMI 39.    Intervention: Nutrition and Diabetes education provided on My Plate, CHO counting, meal planning, portion sizes, timing of meals, avoiding snacks between meals unless having a low blood sugar, target ranges for A1C and blood sugars, signs/symptoms and treatment of hyper/hypoglycemia, monitoring blood sugars, taking medications as prescribed, benefits of exercising 30 minutes per day and prevention of complications of DM.  Goals  Follow My Plate  Eat 2-3 carb choices per meal  Increase water intake  Cut out snacks  Eat meals on time  Walk 30 mintues 3-4 times per week. Lose 1 lb per week  Teaching Method Utilized:  Visual Auditory Hands on  Handouts given during visit include:  The Plate Method  Meal Plan Cared   Barriers to learning/adherence to lifestyle change: none  Demonstrated degree of understanding via:  Teach Back   Monitoring/Evaluation:  Dietary intake, exercise, and body weight in 1 month(s).

## 2018-07-27 NOTE — Patient Instructions (Signed)
Goals  Follow My Plate  Eat 2-3 carb choices per meal  Increase water intake  Cut out snacks  Eat meals on time  Walk 30 mintues 3-4 times per week. Lose 1 lb per week

## 2018-09-05 ENCOUNTER — Encounter: Payer: Self-pay | Admitting: Family Medicine

## 2018-09-05 DIAGNOSIS — R32 Unspecified urinary incontinence: Secondary | ICD-10-CM

## 2018-09-05 MED ORDER — OXYBUTYNIN CHLORIDE ER 10 MG PO TB24: 10.0000 mg | ORAL_TABLET | Freq: Every day | ORAL | 3 refills | Status: DC

## 2018-09-12 LAB — HM MAMMOGRAPHY

## 2018-09-13 ENCOUNTER — Encounter: Payer: Self-pay | Admitting: Family Medicine

## 2018-09-13 ENCOUNTER — Ambulatory Visit: Payer: 59 | Admitting: Nutrition

## 2018-09-14 ENCOUNTER — Ambulatory Visit (INDEPENDENT_AMBULATORY_CARE_PROVIDER_SITE_OTHER): Payer: 59 | Admitting: Medical

## 2018-09-14 ENCOUNTER — Encounter: Payer: Self-pay | Admitting: Medical

## 2018-09-14 ENCOUNTER — Encounter: Payer: Self-pay | Admitting: Family Medicine

## 2018-09-14 VITALS — BP 130/90 | HR 68 | Temp 98.4°F | Resp 16 | Ht 71.0 in | Wt 275.4 lb

## 2018-09-14 DIAGNOSIS — R062 Wheezing: Secondary | ICD-10-CM | POA: Diagnosis not present

## 2018-09-14 DIAGNOSIS — R05 Cough: Secondary | ICD-10-CM

## 2018-09-14 DIAGNOSIS — J988 Other specified respiratory disorders: Secondary | ICD-10-CM | POA: Diagnosis not present

## 2018-09-14 DIAGNOSIS — R059 Cough, unspecified: Secondary | ICD-10-CM

## 2018-09-14 MED ORDER — AZITHROMYCIN 250 MG PO TABS: ORAL_TABLET | ORAL | 0 refills | Status: DC

## 2018-09-14 MED ORDER — HYDROCOD POLST-CPM POLST ER 10-8 MG/5ML PO SUER: 5.0000 mL | Freq: Two times a day (BID) | ORAL | 0 refills | Status: DC | PRN

## 2018-09-14 NOTE — Patient Instructions (Addendum)
Your breathing test was abnormal today suggesting asthma  Recommendations:  Your current symptoms suggest respiratory tract infection with a flareup of your airway.  Begin Z-Pak antibiotic for the next 5 days  You can use the Tussionex cough syrup as needed with caution as this can make you sleepy  You should be using her albuterol rescue inhaler 2 puffs at least 3 times daily for the next 4 to 5 days, but you can also use 2 puffs every 4-6 hours for shortness of breath, wheezing, and cough fits  Drink plenty of water to help get rid of secretions and mucus  You can use Tylenol for pain if needed  If not improving or worse over the weekend call back  I recommend you do a follow-up breathing test and recheck with Dr. Redmond School in a month when you are better to see if the lung volumes still show any signs of airway obstruction at that time.   Bronchospasm, Adult Bronchospasm is a tightening of the airways going into the lungs. During an episode, it may be harder to breathe. You may cough, and you may make a whistling sound when you breathe (wheeze). This condition often affects people with asthma. What are the causes? This condition is caused by swelling and irritation in the airways. It can be triggered by:  An infection (common).  Seasonal allergies.  An allergic reaction.  Exercise.  Irritants. These include pollution, cigarette smoke, strong odors, aerosol sprays, and paint fumes.  Weather changes. Winds increase molds and pollens in the air. Cold air may cause swelling.  Stress and emotional upset.  What are the signs or symptoms? Symptoms of this condition include:  Wheezing. If the episode was triggered by an allergy, wheezing may start right away or hours later.  Nighttime coughing.  Frequent or severe coughing with a simple cold.  Chest tightness.  Shortness of breath.  Decreased ability to exercise.  How is this diagnosed? This condition is usually  diagnosed with a review of your medical history and a physical exam. Tests, such as lung function tests, are sometimes done to look for other conditions. The need for a chest X-ray depends on where the wheezing occurs and whether it is the first time you have wheezed. How is this treated? This condition may be treated with:  Inhaled medicines. These open up the airways and help you breathe. They can be taken with an inhaler or a nebulizer device.  Corticosteroid medicines. These may be given for severe bronchospasm, usually when it is associated with asthma.  Avoiding triggers, such as irritants, infection, or allergies.  Follow these instructions at home: Medicines  Take over-the-counter and prescription medicines only as told by your health care provider.  If you need to use an inhaler or nebulizer to take your medicine, ask your health care provider to explain how to use it correctly. If you were given a spacer, always use it with your inhaler. Lifestyle  Reduce the number of triggers in your home. To do this: ? Change your heating and air conditioning filter at least once a month. ? Limit your use of fireplaces and wood stoves. ? Do not smoke. Do not allow smoking in your home. ? Avoid using perfumes and fragrances. ? Get rid of pests, such as roaches and mice, and their droppings. ? Remove any mold from your home. ? Keep your house clean and dust free. Use unscented cleaning products. ? Replace carpet with wood, tile, or vinyl flooring. Carpet can  trap dander and dust. ? Use allergy-proof pillows, mattress covers, and box spring covers. ? Wash bed sheets and blankets every week in hot water. Dry them in a dryer. ? Use blankets that are made of polyester or cotton. ? Wash your hands often. ? Do not allow pets in your bedroom.  Avoid breathing in cold air when you exercise. General instructions  Have a plan for seeking medical care. Know when to call your health care provider  and local emergency services, and where to get emergency care.  Stay up to date on your immunizations.  When you have an episode of bronchospasm, stay calm. Try to relax and breathe more slowly.  If you have asthma, make sure you have an asthma action plan.  Keep all follow-up visits as told by your health care provider. This is important. Contact a health care provider if:  You have muscle aches.  You have chest pain.  The mucus that you cough up (sputum) changes from clear or white to yellow, green, gray, or bloody.  You have a fever.  Your sputum gets thicker. Get help right away if:  Your wheezing and coughing get worse, even after you take your prescribed medicines.  It gets even harder to breathe.  You develop severe chest pain. Summary  Bronchospasm is a tightening of the airways going into the lungs.  During an episode of bronchospasm, you may have a harder time breathing. You may cough and make a whistling sound when you breathe (wheeze).  Avoid exposure to triggers such as smoke, dust, mold, animal dander, and fragrances.  When you have an episode of bronchospasm, stay calm. Try to relax and breathe more slowly. This information is not intended to replace advice given to you by your health care provider. Make sure you discuss any questions you have with your health care provider. Document Released: 09/24/2003 Document Revised: 09/17/2016 Document Reviewed: 09/17/2016 Elsevier Interactive Patient Education  2017 [employer].

## 2018-09-14 NOTE — Progress Notes (Signed)
Subjective: Chief Complaint  Patient presents with  . cough    cough, headache, chills X 3 days   Here for 3 days of bad cough, headache, chills, and has coughed so much ribs hurts, has wheezing.    She notes this past July came in for similar symptoms.   Been using left over cough medication from that visit.  Has felt feverish.   Has had some sore throat.   Mostly chest congestion.   No NVD.   Has had sick contacts, husband has been sick with similar.   Using some aspirin for fever and headaches.   She is newly diagnosis diabetic and she notes 3 times this year having cough/bronchitis episode.   This hasn't been an issue prior to this year.  No other aggravating or relieving factors. No other complaint.  Past Medical History:  Diagnosis Date  . Adjustment insomnia    SHIFT WORK  . Allergy    RHINITIS  . Atrial flutter [facility]) 02/26/15   [facility] [place]  . Bursitis    right hip  . Migraine headache   . Mobitz type 2 second degree heart block   . Obesity    Current Outpatient Medications on File Prior to Visit  Medication Sig Dispense Refill  . acetaminophen (TYLENOL ARTHRITIS PAIN) 650 MG CR tablet Take 650 mg by mouth every 8 (eight) hours as needed for pain.    Marland Kitchen aspirin EC 81 MG tablet Take 81 mg by mouth daily.    Marland Kitchen buPROPion (WELLBUTRIN SR) 100 MG 12 hr tablet Take 1 tablet (100 mg total) by mouth daily. 90 tablet 3  . glucose blood (ONE TOUCH ULTRA TEST) test strip USE AS INSTRUCTED 100 each 12  . lisinopril-hydrochlorothiazide (PRINZIDE,ZESTORETIC) 10-12.5 MG tablet Take 1 tablet by mouth daily. 90 tablet 3  . Multiple Vitamins-Minerals (MULTIVITAMIN WITH MINERALS) tablet Take 1 tablet by mouth every morning.     Marland Kitchen oxybutynin (DITROPAN XL) 10 MG 24 hr tablet Take 1 tablet (10 mg total) by mouth at bedtime. 90 tablet 3  . PARoxetine (PAXIL) 20 MG tablet Take 1 tablet (20 mg total) by mouth daily. 90 tablet 3  . albuterol (PROVENTIL HFA;VENTOLIN HFA) 108 (90 Base)  MCG/ACT inhaler TAKE 2 PUFFS BY MOUTH EVERY 6 HOURS AS NEEDED FOR WHEEZE OR SHORTNESS OF BREATH (Patient not taking: Reported on 07/18/2018) 6.7 Inhaler 0  . conjugated estrogens (PREMARIN) vaginal cream Place 1 Applicatorful vaginally 3 (three) times a week. (Patient not taking: Reported on 07/27/2018) 42.5 g 12  . Lancets (ACCU-CHEK SOFT TOUCH) lancets Use as instructed 100 each 12   No current facility-administered medications on file prior to visit.    ROS as in subjective    Objective: BP 130/90   Pulse 68   Temp 98.4 F (36.9 C) (Oral)   Resp 16   Ht 5\' 11"  (1.803 m)   Wt 275 lb 6.4 oz (124.9 kg)   LMP 01/26/2011   SpO2 95%   BMI 38.41 kg/m   General appearance: Alert, WD/WN, no distress,somewhat  ill appearing                             Skin: warm, no rash, no diaphoresis                           Head: no sinus tenderness  Eyes: conjunctiva normal, corneas clear, PERRLA                            Ears: pearly TMs, external ear canals normal                          Nose: septum midline, turbinates swollen, with erythema and mucoid discharge             Mouth/throat: MMM, tongue normal, mild pharyngeal erythema                           Neck: supple, no adenopathy, no thyromegaly, non tender                          Heart: RRR, normal S1, S2, no murmurs                         Lungs: +bronchial breath sounds, +scattered rhonchi,+faint  wheezes, no rales                Extremities: no edema, non tender         Assessment: Encounter Diagnoses  Name Primary?  . Cough Yes  . Wheezing   . Respiratory tract infection     Plan: Discussed symptoms, exam findings, and PFT abnormal  Discussed the following:  Your breathing test was abnormal today suggesting asthma  Recommendations:  Your current symptoms suggest respiratory tract infection with a flareup of your airway.  Begin Z-Pak antibiotic for the next 5 days  You can use the  Tussionex cough syrup as needed with caution as this can make you sleepy  You should be using her albuterol rescue inhaler 2 puffs at least 3 times daily for the next 4 to 5 days, but you can also use 2 puffs every 4-6 hours for shortness of breath, wheezing, and cough fits  Drink plenty of water to help get rid of secretions and mucus  You can use Tylenol for pain if needed  If not improving or worse over the weekend call back  I recommend you do a follow-up breathing test and recheck with Dr. Redmond School in a month when you are better to see if the lung volumes still show any signs of airway obstruction at that time.  Laurie Mckinney was seen today for cough.  Diagnoses and all orders for this visit:  Cough -     Pulse oximetry (single); Future -     Spirometry with Graph  Wheezing -     Pulse oximetry (single); Future -     Spirometry with Graph  Respiratory tract infection -     Pulse oximetry (single); Future -     Spirometry with Graph  Other orders -     azithromycin (ZITHROMAX) 250 MG tablet; 2 tablets day 1, then 1 tablet days 2-4 -     chlorpheniramine-HYDROcodone (TUSSIONEX PENNKINETIC ER) 10-8 MG/5ML SUER; Take 5 mLs by mouth every 12 (twelve) hours as needed for cough.

## 2018-09-16 ENCOUNTER — Ambulatory Visit: Payer: 59 | Admitting: Family Medicine

## 2018-09-27 ENCOUNTER — Encounter: Payer: Self-pay | Admitting: Family Medicine

## 2018-09-27 MED ORDER — HYDROCOD POLST-CPM POLST ER 10-8 MG/5ML PO SUER: 5.0000 mL | Freq: Two times a day (BID) | ORAL | 0 refills | Status: DC | PRN

## 2018-09-29 ENCOUNTER — Ambulatory Visit (INDEPENDENT_AMBULATORY_CARE_PROVIDER_SITE_OTHER): Payer: 59 | Admitting: Family Medicine

## 2018-09-29 ENCOUNTER — Encounter: Payer: Self-pay | Admitting: Family Medicine

## 2018-09-29 VITALS — BP 124/82 | HR 51 | Temp 98.0°F | Wt 281.6 lb

## 2018-09-29 DIAGNOSIS — E669 Obesity, unspecified: Secondary | ICD-10-CM | POA: Diagnosis not present

## 2018-09-29 DIAGNOSIS — E119 Type 2 diabetes mellitus without complications: Secondary | ICD-10-CM | POA: Insufficient documentation

## 2018-09-29 DIAGNOSIS — I152 Hypertension secondary to endocrine disorders: Secondary | ICD-10-CM

## 2018-09-29 DIAGNOSIS — J988 Other specified respiratory disorders: Secondary | ICD-10-CM | POA: Diagnosis not present

## 2018-09-29 DIAGNOSIS — E1159 Type 2 diabetes mellitus with other circulatory complications: Secondary | ICD-10-CM | POA: Diagnosis not present

## 2018-09-29 DIAGNOSIS — I10 Essential (primary) hypertension: Secondary | ICD-10-CM

## 2018-09-29 LAB — POCT GLYCOSYLATED HEMOGLOBIN (HGB A1C): HEMOGLOBIN A1C: 6.6 % — AB (ref 4.0–5.6)

## 2018-09-29 MED ORDER — AMOXICILLIN-POT CLAVULANATE 875-125 MG PO TABS: 1.0000 | ORAL_TABLET | Freq: Two times a day (BID) | ORAL | 0 refills | Status: DC

## 2018-09-29 NOTE — Progress Notes (Deleted)
  Subjective:    Patient ID: Laurie Mckinney, female    DOB: 1961-08-16, 57 y.o.   MRN: [id number]  Laurie Mckinney is a 57 y.o. female who presents for follow-up of Type 2 diabetes mellitus.  Patient is checking home blood sugars.   Home blood sugar records: meter records How often is blood sugars being checked: 110-179 fasting or at least two hours post meal Current symptoms/problems include none at this time. Daily foot checks: yes   Any foot concerns: no Last eye exam: 3-4 months ago Exercise: not much /   The following portions of the patient's history were reviewed and updated as appropriate: allergies, current medications, past medical history, past social history and problem list.  ROS as in subjective above.     Objective:    Physical Exam Alert and in no distress otherwise not examined.  Last menstrual period 01/26/2011.  Lab Review Diabetic Labs Latest Ref Rng & Units 05/05/2018 01/21/2017 01/10/2016 02/06/2015 01/30/2015  HbA1c 4.8 - 5.6 % 6.6(H) 6.2(H) - - -  Chol 100 - 199 mg/dL 131 123 121(L) - -  HDL >39 mg/dL 52 49(L) 56 - -  Calc LDL 0 - 99 mg/dL 68 63 52 - -  Triglycerides 0 - 149 mg/dL 56 55 63 - -  Creatinine 0.57 - 1.00 mg/dL 0.71 0.81 0.58 0.69 0.61   BP/Weight 09/14/2018 07/27/2018 07/18/2018 04/13/239 06/12/3531  Systolic BP 992 - 426 834 196  Diastolic BP 90 - 82 86 84  Wt. (Lbs) 275.4 285 285.7 278.8 280.6  BMI 38.41 39.75 39.85 38.88 39.14   Foot/eye exam completion dates Latest Ref Rng & Units 07/22/2018  Eye Exam No Retinopathy Retinopathy(A)  Foot Form Completion - -    Laurie Mckinney  reports that she has never smoked. She has never used smokeless tobacco. She reports that she does not drink alcohol or use drugs.     Assessment & Plan:    No diagnosis found.  1. Rx changes: {none:33079} 2. Education: Reviewed 'ABCs' of diabetes management (respective goals in parentheses):  A1C (<7), blood pressure (<130/80), and cholesterol (LDL  <100). 3. Compliance at present is estimated to be {good/fair/poor:33178}. Efforts to improve compliance (if necessary) will be directed at {compliance:16716}. 4. Follow up: {NUMBERS; 0-10:33138} {time:11}

## 2018-09-29 NOTE — Progress Notes (Signed)
  Subjective:    Patient ID: Laurie Mckinney, female    DOB: Dec 14, 1960, 57 y.o.   MRN: [id number]  Laurie Mckinney is a 57 y.o. female who presents for follow-up of Type 2 diabetes mellitus.  Home blood sugar records: postprandial range: Less than 180 Current symptoms/problems include none and have been unchanged. Daily foot checks:   Any foot concerns: no Exercise: The patient does not participate in regular exercise at present. Diet: Regular She continues have difficulty with cough and congestion and recently was given another cough suppressant.  No fever, chills, sore throat.  She states she is roughly 50% better with the use of azithromycin.  She continues on lisinopril/HCTZ.  She is not on any hypoglycemic agents and presently not on a statin.  Recent blood work did show very good lipid panel.  She admits to not exercising like she should. The following portions of the patient's history were reviewed and updated as appropriate: allergies, current medications, past medical history, past social history and problem list.  ROS as in subjective above.     Objective:    Physical Exam Alert and in no distress otherwise not examined.  Blood pressure 124/82, pulse (!) 51, temperature 98 F (36.7 C), weight 281 lb 9.6 oz (127.7 kg), last menstrual period 01/26/2011, SpO2 97 %.  Lab Review Diabetic Labs Latest Ref Rng & Units 05/05/2018 01/21/2017 01/10/2016 02/06/2015 01/30/2015  HbA1c 4.8 - 5.6 % 6.6(H) 6.2(H) - - -  Chol 100 - 199 mg/dL 131 123 121(L) - -  HDL >39 mg/dL 52 49(L) 56 - -  Calc LDL 0 - 99 mg/dL 68 63 52 - -  Triglycerides 0 - 149 mg/dL 56 55 63 - -  Creatinine 0.57 - 1.00 mg/dL 0.71 0.81 0.58 0.69 0.61   BP/Weight 09/29/2018 09/14/2018 07/27/2018 40/98/1191 01/09/8294  Systolic BP 621 308 - 657 846  Diastolic BP 82 90 - 82 86  Wt. (Lbs) 281.6 275.4 285 285.7 278.8  BMI 39.28 38.41 39.75 39.85 38.88   Foot/eye exam completion dates Latest Ref Rng & Units 07/22/2018  Eye  Exam No Retinopathy Retinopathy(A)  Foot Form Completion - -    [place]  reports that she has never smoked. She has never used smokeless tobacco. She reports that she does not drink alcohol or use drugs.     Assessment & Plan:    Respiratory tract infection - Plan: amoxicillin-clavulanate (AUGMENTIN) 875-125 MG tablet  Type 2 diabetes mellitus without complication, without long-term current use of insulin (HCC) - Plan: POCT glycosylated hemoglobin (Hb A1C)  Hypertension associated with diabetes (HCC)  Obesity (BMI 30-39.9)    1. Rx changes: Augmentin 2. Education: Reviewed 'ABCs' of diabetes management (respective goals in parentheses):  A1C (<7), blood pressure (<130/80), and cholesterol (LDL <100). 3. Compliance at present is estimated to be good. Efforts to improve compliance (if necessary) will be directed at increased exercise. 4. Follow up: 4 months  She is to call me if not entirely back to normal when she finishes the antibiotic.

## 2018-09-30 MED ORDER — AMOXICILLIN-POT CLAVULANATE 875-125 MG PO TABS: 1.0000 | ORAL_TABLET | Freq: Two times a day (BID) | ORAL | 0 refills | Status: DC

## 2018-10-11 ENCOUNTER — Encounter: Payer: Self-pay | Admitting: Family Medicine

## 2018-11-14 ENCOUNTER — Telehealth: Payer: Self-pay | Admitting: *Deleted

## 2018-11-14 ENCOUNTER — Other Ambulatory Visit: Payer: Self-pay | Admitting: Gynecologic Oncology

## 2018-11-14 DIAGNOSIS — N952 Postmenopausal atrophic vaginitis: Secondary | ICD-10-CM

## 2018-11-14 MED ORDER — ESTROGENS, CONJUGATED 0.625 MG/GM VA CREA: 1.0000 | TOPICAL_CREAM | VAGINAL | 8 refills | Status: DC

## 2018-11-14 NOTE — Telephone Encounter (Signed)
Patient called and requested a refill on premarin. Patient would like the [facility]ill to [facility] [place].

## 2018-12-21 ENCOUNTER — Encounter: Payer: Self-pay | Admitting: Family Medicine

## 2019-01-05 ENCOUNTER — Encounter: Payer: Self-pay | Admitting: Family Medicine

## 2019-01-06 ENCOUNTER — Encounter: Payer: Self-pay | Admitting: Family Medicine

## 2019-04-13 ENCOUNTER — Ambulatory Visit: Payer: Managed Care, Other (non HMO) | Admitting: Medical

## 2019-04-13 ENCOUNTER — Other Ambulatory Visit: Payer: Self-pay

## 2019-04-13 ENCOUNTER — Encounter: Payer: Self-pay | Admitting: Medical

## 2019-04-13 VITALS — BP 166/93 | HR 78 | Temp 96.3°F | Wt 280.0 lb

## 2019-04-13 DIAGNOSIS — R270 Ataxia, unspecified: Secondary | ICD-10-CM | POA: Insufficient documentation

## 2019-04-13 DIAGNOSIS — H9190 Unspecified hearing loss, unspecified ear: Secondary | ICD-10-CM | POA: Insufficient documentation

## 2019-04-13 DIAGNOSIS — M545 Low back pain, unspecified: Secondary | ICD-10-CM

## 2019-04-13 DIAGNOSIS — E1159 Type 2 diabetes mellitus with other circulatory complications: Secondary | ICD-10-CM

## 2019-04-13 DIAGNOSIS — R42 Dizziness and giddiness: Secondary | ICD-10-CM | POA: Insufficient documentation

## 2019-04-13 DIAGNOSIS — G8929 Other chronic pain: Secondary | ICD-10-CM

## 2019-04-13 DIAGNOSIS — E119 Type 2 diabetes mellitus without complications: Secondary | ICD-10-CM

## 2019-04-13 DIAGNOSIS — Z8669 Personal history of other diseases of the nervous system and sense organs: Secondary | ICD-10-CM

## 2019-04-13 DIAGNOSIS — R4701 Aphasia: Secondary | ICD-10-CM | POA: Insufficient documentation

## 2019-04-13 NOTE — Progress Notes (Signed)
Subjective: Chief Complaint  Patient presents with  . diziness    dizziness, been going on for 2 weeks.    This visit type was conducted due to national recommendations for restrictions regarding the COVID-19 Pandemic (e.g. social distancing) in an effort to limit this patient's exposure and mitigate transmission in our community.  Due to their co-morbid illnesses, this patient is at least at moderate risk for complications without adequate follow up.  This format is felt to be most appropriate for this patient at this time.    Documentation for virtual audio and video telecommunications through Zoom encounter:  The patient was located at home. The provider was located in the office. The patient did consent to this visit and is aware of possible charges through their insurance for this visit.  The other persons participating in this telemedicine service were none. Time spent on call was 30 minutes and in review of previous records >30 minutes total.  This virtual service is not related to other E/M service within previous 7 days.    Virtual consult for dizziness.  Hasn't been able to keep balance in check for almost 2 weeks.  Feels loopy when she gets up in the morning.  Has felt wobbly.   Been on vacation this past week.   Feels like her reaction time has been off as well.  The dizziness has been constant.   No dyspnea.  Has had some indigestion.   No palpitations.   Does get chest discomfort and dyspneic if bending over to do a chore.   No swelling in her legs.   Has incontinence of urine , but this is not new.  No bowel incontinence.   husband says sometimes she may say something that doesn't come out right.   No slurred speech.   Sometimes cant bring up the right word.  No unilateral weakness, numbness.   No facial weakness.  No vision change.  She is diabetic and checking glucose.  Has been eating healthier of late, drinking more water.  Not currently taking diabetes medication.  Glucose  has been running 150s fasting.  Average has been 150s.    She has checking BP as well.  yesterday BP was 144/69.   121/90 few days ago.   161/93, pulse 78 this morning.  Suppose to have hearing aids, but hasn't been wearing due to need to replace the battery.    No other c/o.   Past Medical History:  Diagnosis Date  . Adjustment insomnia    SHIFT WORK  . Allergy    RHINITIS  . Atrial flutter [facility]) 02/26/15   [facility] [place]  . Bursitis    right hip  . Migraine headache   . Mobitz type 2 second degree heart block   . Obesity    Current Outpatient Medications on File Prior to Visit  Medication Sig Dispense Refill  . acetaminophen (TYLENOL ARTHRITIS PAIN) 650 MG CR tablet Take 650 mg by mouth every 8 (eight) hours as needed for pain.    Marland Kitchen albuterol (PROVENTIL HFA;VENTOLIN HFA) 108 (90 Base) MCG/ACT inhaler TAKE 2 PUFFS BY MOUTH EVERY 6 HOURS AS NEEDED FOR WHEEZE OR SHORTNESS OF BREATH 6.7 Inhaler 0  . aspirin EC 81 MG tablet Take 81 mg by mouth daily.    Marland Kitchen buPROPion (WELLBUTRIN SR) 100 MG 12 hr tablet Take 1 tablet (100 mg total) by mouth daily. 90 tablet 3  . glucose blood (ONE TOUCH ULTRA TEST) test strip USE AS INSTRUCTED  100 each 12  . Lancets (ACCU-CHEK SOFT TOUCH) lancets Use as instructed 100 each 12  . lisinopril-hydrochlorothiazide (PRINZIDE,ZESTORETIC) 10-12.5 MG tablet Take 1 tablet by mouth daily. 90 tablet 3  . Multiple Vitamins-Minerals (MULTIVITAMIN WITH MINERALS) tablet Take 1 tablet by mouth every morning.     Marland Kitchen oxybutynin (DITROPAN XL) 10 MG 24 hr tablet Take 1 tablet (10 mg total) by mouth at bedtime. 90 tablet 3  . PARoxetine (PAXIL) 20 MG tablet Take 1 tablet (20 mg total) by mouth daily. 90 tablet 3   No current facility-administered medications on file prior to visit.    ROS as in subjective   Objective: BP (!) 166/93   Pulse 78   Temp (!) 96.3 F (35.7 C) (Oral)   Wt 280 lb (127 kg)   LMP 01/26/2011   BMI 39.05 kg/m   Gen: wd, wn,  nad Not examined in person, virtual consult    Assessment: Encounter Diagnoses  Name Primary?  . Ataxia Yes  . Disequilibrium   . Hypertension associated with diabetes ([place])   . Type 2 diabetes mellitus without complication, without long-term current use of insulin ([place])   . History of migraine headaches   . Chronic low back pain without sciatica, unspecified back pain laterality   . Hearing loss, unspecified hearing loss type, unspecified laterality   . Aphasia      Plan: This consult was initially a virtual consult.  She will return tomorrow morning for vital signs, labs, EKG and exam.  At that time we will change this to a face-to-face in person visit.  Pending studies, may need to pursue MRI brain or neurology consult.   Laurie Mckinney was seen today for diziness.  Diagnoses and all orders for this visit:  Ataxia -     Comprehensive metabolic panel; Future -     CBC with Differential/Platelet; Future -     Vitamin B12; Future -     TSH; Future -     RPR; Future -     HIV Antibody (routine testing w rflx); Future -     EKG 12-Lead; Future  Disequilibrium -     Comprehensive metabolic panel; Future -     CBC with Differential/Platelet; Future -     Vitamin B12; Future -     TSH; Future  Hypertension associated with diabetes ([place]) -     EKG 12-Lead; Future  Type 2 diabetes mellitus without complication, without long-term current use of insulin (HCC) -     Hemoglobin A1c; Future  History of migraine headaches  Chronic low back pain without sciatica, unspecified back pain laterality  Hearing loss, unspecified hearing loss type, unspecified laterality  Aphasia -     Comprehensive metabolic panel; Future -     CBC with Differential/Platelet; Future -     RPR; Future -     HIV Antibody (routine testing w rflx); Future -     EKG 12-Lead; Future

## 2019-04-14 ENCOUNTER — Ambulatory Visit (INDEPENDENT_AMBULATORY_CARE_PROVIDER_SITE_OTHER): Payer: Managed Care, Other (non HMO) | Admitting: Medical

## 2019-04-14 ENCOUNTER — Other Ambulatory Visit: Payer: Managed Care, Other (non HMO)

## 2019-04-14 ENCOUNTER — Other Ambulatory Visit: Payer: Self-pay | Admitting: Medical

## 2019-04-14 ENCOUNTER — Encounter: Payer: Self-pay | Admitting: Medical

## 2019-04-14 ENCOUNTER — Telehealth: Payer: Self-pay | Admitting: Medical

## 2019-04-14 VITALS — BP 120/78 | HR 91 | Temp 98.2°F | Ht 72.0 in | Wt 283.6 lb

## 2019-04-14 DIAGNOSIS — E119 Type 2 diabetes mellitus without complications: Secondary | ICD-10-CM | POA: Diagnosis not present

## 2019-04-14 DIAGNOSIS — I4891 Unspecified atrial fibrillation: Secondary | ICD-10-CM | POA: Insufficient documentation

## 2019-04-14 DIAGNOSIS — R270 Ataxia, unspecified: Secondary | ICD-10-CM

## 2019-04-14 DIAGNOSIS — R05 Cough: Secondary | ICD-10-CM

## 2019-04-14 DIAGNOSIS — R0602 Shortness of breath: Secondary | ICD-10-CM | POA: Insufficient documentation

## 2019-04-14 DIAGNOSIS — R4701 Aphasia: Secondary | ICD-10-CM

## 2019-04-14 DIAGNOSIS — R059 Cough, unspecified: Secondary | ICD-10-CM

## 2019-04-14 DIAGNOSIS — R0609 Other forms of dyspnea: Secondary | ICD-10-CM

## 2019-04-14 DIAGNOSIS — I10 Essential (primary) hypertension: Secondary | ICD-10-CM

## 2019-04-14 DIAGNOSIS — R42 Dizziness and giddiness: Secondary | ICD-10-CM | POA: Diagnosis not present

## 2019-04-14 DIAGNOSIS — I8390 Asymptomatic varicose veins of unspecified lower extremity: Secondary | ICD-10-CM

## 2019-04-14 MED ORDER — LISINOPRIL-HYDROCHLOROTHIAZIDE 10-12.5 MG PO TABS: 1.0000 | ORAL_TABLET | Freq: Every day | ORAL | 0 refills | Status: DC

## 2019-04-14 MED ORDER — METOPROLOL TARTRATE 25 MG PO TABS: 12.5000 mg | ORAL_TABLET | Freq: Two times a day (BID) | ORAL | 1 refills | Status: DC

## 2019-04-14 MED ORDER — APIXABAN 5 MG PO TABS: 5.0000 mg | ORAL_TABLET | Freq: Two times a day (BID) | ORAL | 2 refills | Status: DC

## 2019-04-14 MED ORDER — LISINOPRIL-HYDROCHLOROTHIAZIDE 10-12.5 MG PO TABS: 0.5000 | ORAL_TABLET | Freq: Every day | ORAL | 0 refills | Status: DC

## 2019-04-14 NOTE — Telephone Encounter (Signed)
Please call patient and let her know the following: Begin Eliquis 5 mg twice daily anticoagulation medication DO NOT begin metoprolol that previously told her to start Do not cut her lisinopril HCT in half, instead take it like she has been taking it  Please see if we have a coupon card and [facility]. Or see if we can take a picture to send her so she can use

## 2019-04-14 NOTE — Progress Notes (Signed)
Subjective Chief Complaint  Patient presents with  . Dizziness    x 2 week   . Cough    x2 days    Here for f/u from yesterdays virtual consult.   Here for consult for dizziness.  Hasn't been able to keep balance in check for almost 2 weeks.  Feels loopy when she gets up in the morning.  Has felt wobbly.   Been on vacation this past week.   Feels like her reaction time has been off as well.  The dizziness has been constant.   No dyspnea.  Has had some indigestion.   No palpitations.   Does get chest discomfort and dyspneic if bending over to do a chore.   No swelling in her legs.   Has incontinence of urine , but this is not new.  No bowel incontinence.   husband says sometimes she may say something that doesn't come out right.   No slurred speech.   Sometimes cant bring up the right word.  No unilateral weakness, numbness.   No facial weakness.  No vision change.  She is diabetic and checking glucose.   Diagnosed with diabetes back in December 2019.   Has been eating healthier of late, drinking more water.  Not currently taking diabetes medication.  Glucose has been running 150s fasting.  Average has been 150s.    She has checking BP as well.  yesterday BP was 144/69.   121/90 few days ago.   161/93, pulse 78 this morning.  Suppose to have hearing aids, but hasn't been wearing due to need to replace the battery.    She notes history of heart arrhythmia in the past.  Has seen cardiology in the past for this.  She also notes new cough within the past 1 and half days.  No fever no COVID contact.  She attributes this to allergies.  No body aches or chills.  Does not feel sick.  No other c/o.   Past Medical History:  Diagnosis Date  . Adjustment insomnia    SHIFT WORK  . Allergy    RHINITIS  . Atrial flutter [facility]) 02/26/15   [facility] [place]  . Bursitis    right hip  . Migraine headache   . Mobitz type 2 second degree heart block   . Obesity    Current Outpatient  Medications on File Prior to Visit  Medication Sig Dispense Refill  . acetaminophen (TYLENOL ARTHRITIS PAIN) 650 MG CR tablet Take 650 mg by mouth every 8 (eight) hours as needed for pain.    Marland Kitchen albuterol (PROVENTIL HFA;VENTOLIN HFA) 108 (90 Base) MCG/ACT inhaler TAKE 2 PUFFS BY MOUTH EVERY 6 HOURS AS NEEDED FOR WHEEZE OR SHORTNESS OF BREATH 6.7 Inhaler 0  . buPROPion (WELLBUTRIN SR) 100 MG 12 hr tablet Take 1 tablet (100 mg total) by mouth daily. 90 tablet 3  . glucose blood (ONE TOUCH ULTRA TEST) test strip USE AS INSTRUCTED 100 each 12  . Multiple Vitamins-Minerals (MULTIVITAMIN WITH MINERALS) tablet Take 1 tablet by mouth every morning.     Marland Kitchen oxybutynin (DITROPAN XL) 10 MG 24 hr tablet Take 1 tablet (10 mg total) by mouth at bedtime. 90 tablet 3  . PARoxetine (PAXIL) 20 MG tablet Take 1 tablet (20 mg total) by mouth daily. 90 tablet 3  . Lancets (ACCU-CHEK SOFT TOUCH) lancets Use as instructed (Patient not taking: Reported on 04/14/2019) 100 each 12   No current facility-administered medications on file prior to visit.  ROS as in subjective    Objective: BP 120/78   Pulse 91   Temp 98.2 F (36.8 C) (Oral)   Ht 6' (1.829 m)   Wt 283 lb 9.6 oz (128.6 kg)   LMP 01/26/2011   SpO2 95%   BMI 38.46 kg/m   Wt Readings from Last 3 Encounters:  04/14/19 283 lb 9.6 oz (128.6 kg)  04/13/19 280 lb (127 kg)  09/29/18 281 lb 9.6 oz (127.7 kg)     General appearance: alert, no distress, WD/WN, white female Skin: warm, dry HEENT: normocephalic, sclerae anicteric, PERRLA, EOMi, nares patent, no discharge or erythema, pharynx normal Oral cavity: MMM, no lesions Neck: supple, no lymphadenopathy, no thyromegaly, no masses, no bruits or JVD Heart: irregular, normal S1, S2, no murmurs Lungs: +coughing at times, CTA bilaterally, no wheezes, rhonchi, or rales Abdomen: +bs, soft, non tender, non distended, no masses, no hepatomegaly, no splenomegaly Back: non tender Musculoskeletal: nontender,  no swelling, no obvious deformity Extremities: + Moderate varicose veins bilateral lower extremities, no calf tenderness no palpable cord, negative Homans, no edema, no cyanosis, no clubbing Pulses: 2+ symmetric, upper and lower extremities, normal cap refill Neurological: + She seems off balance at times, seems to lean with Romberg, she has 2 reach out to grab table and walking in general so we did not attempt heel-to-toe.  Alert, oriented x 3, CN2-12 intact, strength normal upper extremities and lower extremities, sensation normal throughout, DTRs 2+ throughout,  Psychiatric: normal affect, behavior normal, pleasant    EKG  Indications: Dizziness and dyspnea on exertion, rate 64 bpm, QRS 80 ms, QTC 422 ms, axis -9 degrees, atrial fibrillation,new afib compared to 01/2016 mobitz 2 block.    CHADS2 score of 3.      Assessment: Encounter Diagnoses  Name Primary?  . Atrial fibrillation, unspecified type ([place]) Yes  . Type 2 diabetes mellitus without complication, without long-term current use of insulin ([place])   . Cough   . Ataxia   . Aphasia   . Disequilibrium   . Varicose veins of lower extremity, unspecified laterality, unspecified whether complicated   . DOE (dyspnea on exertion)   . Essential hypertension      Plan: We discussed her concerns, symptoms, and exam findings.  EKG shows new afib.   She has had atrial flutter back in 2016 on prior EKG as well as a B type II block on 2017 EKG.  We discussed other potential causes of symptoms but this seems to be the most likely cause.  We will check labs today.  Also new today she notes a cough that developed within the last 2 days that she did not mention yesterday on the virtual phone call.  Thus we use full PPE today during the visit.  We went ahead and did a COVID swab today.  I called the A. fib clinic and talk to nurse practitioner [name].  We will start her on Eliquis 5 mg twice a day for anticoagulation.  We will continue her  lisinopril HCT 10-12.5 for now.  Since her rate is not terribly high we will hold off on beta-blocker at the time being.  They will try to work her into the A. fib clinic on Monday.  Today is Friday.  She was advised to go to the emergency department over the weekend if any worse symptoms.  I reviewed back of her labs in the chart record from last visit.  We discussed the risk of Eliquis and blood thinners  in general.  We discussed atrial fibrillation, potential complications, diagnosis, symptoms.  We discussed self quarantine for now until we have COVID test back.  Follow-up pending labs   Laurie Mckinney was seen today for dizziness and cough.  Diagnoses and all orders for this visit:  Atrial fibrillation, unspecified type ([place]) -     EKG 12-Lead -     Amb Referral to AFIB Clinic  Type 2 diabetes mellitus without complication, without long-term current use of insulin ([facility]) -     Hemoglobin A1c  Cough -     Novel Coronavirus, NAA ([facility])  Ataxia -     Cancel: HIV Antibody (routine testing w rflx) -     Cancel: RPR -     Vitamin B12 -     TSH -     CBC with Differential/Platelet -     Comprehensive metabolic panel  Aphasia -     Cancel: HIV Antibody (routine testing w rflx) -     Cancel: RPR -     CBC with Differential/Platelet -     Comprehensive metabolic panel -     EKG 82-NOIB  Disequilibrium -     Vitamin B12 -     TSH -     CBC with Differential/Platelet -     Comprehensive metabolic panel -     EKG 70-WUGQ  Varicose veins of lower extremity, unspecified laterality, unspecified whether complicated  DOE (dyspnea on exertion) -     EKG 12-Lead  Essential hypertension -     Discontinue: lisinopril-hydrochlorothiazide (ZESTORETIC) 10-12.5 MG tablet; Take 0.5 tablets by mouth daily. -     lisinopril-hydrochlorothiazide (ZESTORETIC) 10-12.5 MG tablet; Take 1 tablet by mouth daily.  Other orders -     apixaban (ELIQUIS) 5 MG TABS tablet; Take 1 tablet (5 mg total) by  mouth 2 (two) times daily. -     Discontinue: metoprolol tartrate (LOPRESSOR) 25 MG tablet; Take 0.5 tablets (12.5 mg total) by mouth 2 (two) times daily.

## 2019-04-15 LAB — COMPREHENSIVE METABOLIC PANEL
ALT: 19 IU/L (ref 0–32)
AST: 24 IU/L (ref 0–40)
Albumin/Globulin Ratio: 1.4 (ref 1.2–2.2)
Albumin: 4.1 g/dL (ref 3.8–4.9)
Alkaline Phosphatase: 78 IU/L (ref 39–117)
BUN/Creatinine Ratio: 21 (ref 12–28)
BUN: 15 mg/dL (ref 8–27)
Bilirubin Total: 0.3 mg/dL (ref 0.0–1.2)
CO2: 23 mmol/L (ref 20–29)
Calcium: 9.3 mg/dL (ref 8.7–10.3)
Chloride: 101 mmol/L (ref 96–106)
Creatinine, Ser: 0.71 mg/dL (ref 0.57–1.00)
GFR calc Af Amer: 107 mL/min/{1.73_m2} (ref >59)
GFR calc non Af Amer: 93 mL/min/{1.73_m2} (ref >59)
Globulin, Total: 3 g/dL (ref 1.5–4.5)
Glucose: 131 mg/dL — ABNORMAL HIGH (ref 65–99)
Potassium: 4.9 mmol/L (ref 3.5–5.2)
Sodium: 140 mmol/L (ref 134–144)
Total Protein: 7.1 g/dL (ref 6.0–8.5)

## 2019-04-15 LAB — CBC WITH DIFFERENTIAL/PLATELET
Basophils Absolute: 0.1 10*3/uL (ref 0.0–0.2)
Basos: 1 %
EOS (ABSOLUTE): 0.1 10*3/uL (ref 0.0–0.4)
Eos: 1 %
Hematocrit: 44.3 % (ref 34.0–46.6)
Hemoglobin: 14.4 g/dL (ref 11.1–15.9)
Immature Grans (Abs): 0 10*3/uL (ref 0.0–0.1)
Immature Granulocytes: 0 %
Lymphocytes Absolute: 2 10*3/uL (ref 0.7–3.1)
Lymphs: 25 %
MCH: 30.6 pg (ref 26.6–33.0)
MCHC: 32.5 g/dL (ref 31.5–35.7)
MCV: 94 fL (ref 79–97)
Monocytes Absolute: 0.7 10*3/uL (ref 0.1–0.9)
Monocytes: 9 %
Neutrophils Absolute: 5 10*3/uL (ref 1.4–7.0)
Neutrophils: 64 %
Platelets: 309 10*3/uL (ref 150–450)
RBC: 4.7 x10E6/uL (ref 3.77–5.28)
RDW: 12.1 % (ref 11.7–15.4)
WBC: 7.7 10*3/uL (ref 3.4–10.8)

## 2019-04-15 LAB — VITAMIN B12: Vitamin B-12: 976 pg/mL (ref 232–1245)

## 2019-04-15 LAB — RPR: RPR Ser Ql: NONREACTIVE

## 2019-04-15 LAB — HIV ANTIBODY (ROUTINE TESTING W REFLEX): HIV Screen 4th Generation wRfx: NONREACTIVE

## 2019-04-15 LAB — TSH: TSH: 0.6 u[IU]/mL (ref 0.450–4.500)

## 2019-04-15 LAB — HEMOGLOBIN A1C
Est. average glucose Bld gHb Est-mCnc: 148 mg/dL
Hgb A1c MFr Bld: 6.8 % — ABNORMAL HIGH (ref 4.8–5.6)

## 2019-04-17 ENCOUNTER — Encounter (HOSPITAL_COMMUNITY): Payer: Self-pay

## 2019-04-18 ENCOUNTER — Encounter: Payer: Self-pay | Admitting: Medical

## 2019-04-18 LAB — SPECIMEN STATUS REPORT

## 2019-04-18 LAB — T4, FREE: Free T4: 1.06 ng/dL (ref 0.82–1.77)

## 2019-04-19 ENCOUNTER — Encounter (HOSPITAL_COMMUNITY): Payer: Self-pay | Admitting: Physician Assistant

## 2019-04-19 ENCOUNTER — Emergency Department (HOSPITAL_COMMUNITY): Payer: Managed Care, Other (non HMO)

## 2019-04-19 ENCOUNTER — Emergency Department (HOSPITAL_COMMUNITY)
Admission: EM | Admit: 2019-04-19 | Discharge: 2019-04-19 | Disposition: A | Discharge status: Home / Self Care | Payer: Managed Care, Other (non HMO) | Attending: Emergency Medicine | Admitting: Emergency Medicine

## 2019-04-19 ENCOUNTER — Other Ambulatory Visit: Payer: Self-pay

## 2019-04-19 ENCOUNTER — Ambulatory Visit (HOSPITAL_BASED_OUTPATIENT_CLINIC_OR_DEPARTMENT_OTHER)
Admission: RE | Admit: 2019-04-19 | Discharge: 2019-04-19 | Disposition: A | Discharge status: Home / Self Care | Payer: Managed Care, Other (non HMO) | Source: Ambulatory Visit | Attending: Physician Assistant | Admitting: Physician Assistant

## 2019-04-19 ENCOUNTER — Encounter (HOSPITAL_COMMUNITY): Payer: Self-pay | Admitting: Emergency Medicine

## 2019-04-19 VITALS — BP 142/84 | HR 62 | Ht 72.0 in | Wt 283.0 lb

## 2019-04-19 DIAGNOSIS — W19XXXA Unspecified fall, initial encounter: Secondary | ICD-10-CM

## 2019-04-19 DIAGNOSIS — R296 Repeated falls: Secondary | ICD-10-CM | POA: Diagnosis not present

## 2019-04-19 DIAGNOSIS — I4891 Unspecified atrial fibrillation: Secondary | ICD-10-CM

## 2019-04-19 DIAGNOSIS — I10 Essential (primary) hypertension: Secondary | ICD-10-CM | POA: Diagnosis not present

## 2019-04-19 DIAGNOSIS — Z20828 Contact with and (suspected) exposure to other viral communicable diseases: Secondary | ICD-10-CM | POA: Insufficient documentation

## 2019-04-19 DIAGNOSIS — Z79899 Other long term (current) drug therapy: Secondary | ICD-10-CM | POA: Diagnosis not present

## 2019-04-19 DIAGNOSIS — Y92009 Unspecified place in unspecified non-institutional (private) residence as the place of occurrence of the external cause: Secondary | ICD-10-CM

## 2019-04-19 DIAGNOSIS — E119 Type 2 diabetes mellitus without complications: Secondary | ICD-10-CM | POA: Diagnosis not present

## 2019-04-19 DIAGNOSIS — R2689 Other abnormalities of gait and mobility: Secondary | ICD-10-CM | POA: Diagnosis present

## 2019-04-19 LAB — CBC
HCT: 46.9 % — ABNORMAL HIGH (ref 36.0–46.0)
Hemoglobin: 15.1 g/dL — ABNORMAL HIGH (ref 12.0–15.0)
MCH: 30.8 pg (ref 26.0–34.0)
MCHC: 32.2 g/dL (ref 30.0–36.0)
MCV: 95.5 fL (ref 80.0–100.0)
Platelets: 303 10*3/uL (ref 150–400)
RBC: 4.91 MIL/uL (ref 3.87–5.11)
RDW: 12.8 % (ref 11.5–15.5)
WBC: 7.5 10*3/uL (ref 4.0–10.5)
nRBC: 0 % (ref 0.0–0.2)

## 2019-04-19 LAB — URINALYSIS, ROUTINE W REFLEX MICROSCOPIC
Bacteria, UA: NONE SEEN
Bilirubin Urine: NEGATIVE
Glucose, UA: NEGATIVE mg/dL
Ketones, ur: NEGATIVE mg/dL
Leukocytes,Ua: NEGATIVE
Nitrite: NEGATIVE
Protein, ur: NEGATIVE mg/dL
Specific Gravity, Urine: 1.005 (ref 1.005–1.030)
pH: 6 (ref 5.0–8.0)

## 2019-04-19 LAB — BASIC METABOLIC PANEL
Anion gap: 11 (ref 5–15)
BUN: 10 mg/dL (ref 6–20)
CO2: 25 mmol/L (ref 22–32)
Calcium: 9.4 mg/dL (ref 8.9–10.3)
Chloride: 103 mmol/L (ref 98–111)
Creatinine, Ser: 0.69 mg/dL (ref 0.44–1.00)
GFR calc Af Amer: >60 mL/min (ref >60)
GFR calc non Af Amer: >60 mL/min (ref >60)
Glucose, Bld: 114 mg/dL — ABNORMAL HIGH (ref 70–99)
Potassium: 4.1 mmol/L (ref 3.5–5.1)
Sodium: 139 mmol/L (ref 135–145)

## 2019-04-19 LAB — SARS CORONAVIRUS 2 BY RT PCR (HOSPITAL ORDER, PERFORMED IN ~~LOC~~ HOSPITAL LAB): SARS Coronavirus 2: NEGATIVE

## 2019-04-19 MED ORDER — SODIUM CHLORIDE 0.9% FLUSH: 3.0000 mL | Freq: Once | INTRAVENOUS | Status: DC

## 2019-04-19 NOTE — Progress Notes (Signed)
Primary Care Physician: Denita Lung, MD Primary Cardiologist: Dr Almira Coaster [facility]) Primary Electrophysiologist: none Referring Physician: Chana Bode PA-C   Laurie Mckinney is a 58 y.o. female with a history of DM, HTN, second degree AV block type I, atrial flutter and atrial fibrillation who presents for consultation in the [facility].  The patient was initially diagnosed with atrial fibrillation on ECG at her PCP office after presenting with sudden onset of gait instability, difficulty finding words, falls, and intermittent confusion three weeks ago. Husband reports that she was in her usual state of health just prior to her present symptoms. She does have a history of atrial flutter in 2016 post operatively. She is in rate controlled afib today.  Today, she denies symptoms of palpitations, chest pain, shortness of breath, orthopnea, PND, lower extremity edema, presyncope, syncope, snoring, daytime somnolence, bleeding. The patient is tolerating medications without difficulties and is otherwise without complaint today.    Atrial Fibrillation Risk Factors:   she does not have a history of alcohol use.  she has a BMI of Body mass index is 38.38 kg/m.Marland Kitchen Filed Weights   04/19/19 1111  Weight: 128.4 kg    Family History  Problem Relation Age of Onset  . Arthritis Mother   . Heart disease Father      Atrial Fibrillation Management history:  Previous antiarrhythmic drugs: none Previous cardioversions: none Previous ablations: none CHADS2VASC score: 3 (DM, HTN, female) Anticoagulation history: Eliquis   Past Medical History:  Diagnosis Date  . Adjustment insomnia    SHIFT WORK  . Allergy    RHINITIS  . Atrial flutter [facility]) 02/26/15   [facility] [place]  . Bursitis    right hip  . Migraine headache   . Mobitz type 2 second degree heart block   . Obesity    Past Surgical History:  Procedure Laterality Date  .  COLONOSCOPY  2011   Dr.Brodie  . ROBOTIC ASSISTED TOTAL HYSTERECTOMY WITH BILATERAL SALPINGO OOPHERECTOMY Bilateral 02/05/2015   Procedure: ROBOTIC ASSISTED TOTAL LAPAROSCOPIC HYSTERECTOMY WITH BILATERAL SALPINGO OOPHORECTOMY;  Surgeon: Everitt Amber, MD;  Location: [facility] ORS;  Service: Gynecology;  Laterality: Bilateral;    Current Outpatient Medications  Medication Sig Dispense Refill  . acetaminophen (TYLENOL ARTHRITIS PAIN) 650 MG CR tablet Take 650 mg by mouth every 8 (eight) hours as needed for pain.    Marland Kitchen albuterol (PROVENTIL HFA;VENTOLIN HFA) 108 (90 Base) MCG/ACT inhaler TAKE 2 PUFFS BY MOUTH EVERY 6 HOURS AS NEEDED FOR WHEEZE OR SHORTNESS OF BREATH 6.7 Inhaler 0  . apixaban (ELIQUIS) 5 MG TABS tablet Take 1 tablet (5 mg total) by mouth 2 (two) times daily. 60 tablet 2  . buPROPion (WELLBUTRIN SR) 100 MG 12 hr tablet Take 1 tablet (100 mg total) by mouth daily. 90 tablet 3  . glucose blood (ONE TOUCH ULTRA TEST) test strip USE AS INSTRUCTED 100 each 12  . Lancets (ACCU-CHEK SOFT TOUCH) lancets Use as instructed 100 each 12  . lisinopril-hydrochlorothiazide (ZESTORETIC) 10-12.5 MG tablet Take 1 tablet by mouth daily. 30 tablet 0  . Multiple Vitamins-Minerals (MULTIVITAMIN WITH MINERALS) tablet Take 1 tablet by mouth every morning.     Marland Kitchen oxybutynin (DITROPAN XL) 10 MG 24 hr tablet Take 1 tablet (10 mg total) by mouth at bedtime. 90 tablet 3  . PARoxetine (PAXIL) 20 MG tablet Take 1 tablet (20 mg total) by mouth daily. 90 tablet 3   No current facility-administered medications for this  encounter.     No Known Allergies  Social History   Socioeconomic History  . Marital status: Married    Spouse name: Not on file  . Number of children: Not on file  . Years of education: Not on file  . Highest education level: Not on file  Occupational History  . Not on file  Social Needs  . Financial resource strain: Not on file  . Food insecurity    Worry: Not on file    Inability: Not on file  .  Transportation needs    Medical: Not on file    Non-medical: Not on file  Tobacco Use  . Smoking status: Never Smoker  . Smokeless tobacco: Never Used  Substance and Sexual Activity  . Alcohol use: No  . Drug use: No  . Sexual activity: Yes  Lifestyle  . Physical activity    Days per week: Not on file    Minutes per session: Not on file  . Stress: Not on file  Relationships  . Social Herbalist on phone: Not on file    Gets together: Not on file    Attends religious service: Not on file    Active member of club or organization: Not on file    Attends meetings of clubs or organizations: Not on file    Relationship status: Not on file  . Intimate partner violence    Fear of current or ex partner: Not on file    Emotionally abused: Not on file    Physically abused: Not on file    Forced sexual activity: Not on file  Other Topics Concern  . Not on file  Social History Narrative  . Not on file     ROS- All systems are reviewed and negative except as per the HPI above.  Physical Exam: Vitals:   04/19/19 1111  BP: (!) 142/84  Pulse: 62  Weight: 128.4 kg  Height: 6' (1.829 m)    GEN- The patient is well appearing, alert.  Head- normocephalic, atraumatic Eyes-  Sclera clear, conjunctiva pink Ears- hearing intact Oropharynx- clear Neck- supple  Lungs- Clear to ausculation bilaterally, normal work of breathing Heart- irregular rate and rhythm, no murmurs, rubs or gallops  GI- soft, NT, ND, + BS Extremities- no clubbing, cyanosis, or edema MS- no significant deformity or atrophy Skin- no rash or lesion Psych- euthymic mood, full affect Neuro- strength intact. Unsteady gait  Wt Readings from Last 3 Encounters:  04/19/19 128.4 kg  04/14/19 128.6 kg  04/13/19 127 kg    EKG today demonstrates afib HR 62, slow R wave prog, QRS 84, QTc 406  Echo 02/26/15 demonstrated  EF 60-65%, mild LAE, no valvular issues.  Epic records are reviewed at length today   Assessment and Plan:  1. Atrial fibrillation/atrial flutter The patient has new onset atrial fibrillation of unknown duration.  Avoid AV nodal agents given baseline 2nd degree AV block. Dofetilide would be AAD of choice if needed. Continue Eliquis 5 mg BID See plans below.  This patients CHA2DS2-VASc Score and unadjusted Ischemic Stroke Rate (% per year) is equal to 3.2 % stroke rate/year from a score of 3  Above score calculated as 1 point each if present [CHF, HTN, DM, Vascular=MI/PAD/Aortic Plaque, Age if 65-74, or Female] Above score calculated as 2 points each if present [Age > 75, or Stroke/TIA/TE]  2. Acute onset dizziness/confusion Worrisome in the setting of new onset atrial fibrillation for stroke. Started Eliquis 04/14/19.  Recent lab work at PCP unremarkable. Will refer pt to ER for workup/possible admit.  3. Second degree heart block type I First noted in 2016, has been asymptomatic. Avoid AV nodal agents.  4. HTN Stable, no changes today.   Will refer to ER for further workup and management. Patient and husband voice understanding and are in agreement with plan.   [facility] [address] [place], [place] [address] [phone] 04/19/2019 12:18 PM

## 2019-04-19 NOTE — Progress Notes (Signed)
CSW spoke with PA about patient needing home health. CSW attempted to speak with patient to see if she has or had [place], but per the RN patient has already discharged and was no longer in her room. CSW spoke with RN CM Edwin Cap who stated she will follow up with patient tomorrow at home to set up [facility].   [place]en Circle, LCSW Transitions of Care Department [facility] ED [phone]

## 2019-04-19 NOTE — ED Notes (Signed)
Pt Husband Mark requesting update. Given update at this time, phone number correct in chart for further follow up be primary nurse.

## 2019-04-19 NOTE — ED Triage Notes (Signed)
Pt was sent here from the afib clinic for further workup due to pt being "off balance and having several falls over th elast 3 weeks". Pt states any time she walks she feels dizzy and off balance. Pt states she has been walking into things and has had several falls. Pt started on eliqus recently due to new onset on aifb. Pt denies headache, blurry vision, sob or CP.

## 2019-04-19 NOTE — ED Provider Notes (Signed)
[place] EMERGENCY DEPARTMENT Provider Note   CSN: [id number] Arrival date & time: 04/19/19  1222     History   Chief Complaint Chief Complaint  Patient presents with  . Fall  . Dizziness    HPI Laurie Mckinney is a 58 y.o. female presenting for evaluation of difficulty walking.  Patient states for the past several weeks she has had difficulty walking.  She reports frequent falls.  She is also having difficulty getting her words out.  Patient states her husband describes it as her acting like she is drunk all the time.  Patient saw her PCP, was found to be in new onset A. fib and started on a blood thinner.  This was on 04-14-2019.  She was encouraged to follow-up with A. fib clinic.  She was there today, with a sent over to the ER for concern for stroke.  Patient states since starting a blood thinner, she has fallen, last fall was 3 days ago when she did hit her head.  There is no loss of consciousness.  She denies vision changes, neck pain, back pain, chest pain, shortness of breath, nausea, vomiting, abdominal pain, urinary symptoms, normal bowel movements.  Patient states sometimes he feels like her heart is beating very quickly, this is mostly with ambulation.      HPI  Past Medical History:  Diagnosis Date  . Adjustment insomnia    SHIFT WORK  . Allergy    RHINITIS  . Atrial flutter [facility]) 02/26/15   [facility] [place]  . Bursitis    right hip  . Migraine headache   . Mobitz type 2 second degree heart block   . Obesity     Patient Active Problem List   Diagnosis Date Noted  . Varicose veins of lower extremity 04/14/2019  . Cough 04/14/2019  . Atrial fibrillation ([place]) 04/14/2019  . DOE (dyspnea on exertion) 04/14/2019  . Disequilibrium 04/13/2019  . Ataxia 04/13/2019  . Hearing loss 04/13/2019  . Aphasia 04/13/2019  . Type 2 diabetes mellitus without complication, without long-term current use of insulin ([facility]) 09/29/2018  .  Second degree heart block 05/05/2018  . Urinary incontinence 05/05/2018  . Chronic low back pain without sciatica 05/05/2018  . Hypertension associated with diabetes ([place]) 09/20/2017  . Actinic keratosis 02/24/2017  . Dysthymia 01/10/2016  . History of endometrial cancer 02/05/2015    Class: Stage 1  . History of migraine headaches 03/18/2011  . Allergic rhinitis due to pollen 03/18/2011  . Obesity (BMI 30-39.9) 03/18/2011    Past Surgical History:  Procedure Laterality Date  . COLONOSCOPY  2011   Dr.Brodie  . ROBOTIC ASSISTED TOTAL HYSTERECTOMY WITH BILATERAL SALPINGO OOPHERECTOMY Bilateral 02/05/2015   Procedure: ROBOTIC ASSISTED TOTAL LAPAROSCOPIC HYSTERECTOMY WITH BILATERAL SALPINGO OOPHORECTOMY;  Surgeon: Everitt Amber, MD;  Location: [facility] ORS;  Service: Gynecology;  Laterality: Bilateral;     OB History   No obstetric history on file.      Home Medications    Prior to Admission medications   Medication Sig Start Date End Date Taking? Authorizing Provider  acetaminophen (TYLENOL ARTHRITIS PAIN) 650 MG CR tablet Take 650 mg by mouth every 8 (eight) hours as needed for pain.    [provider]  albuterol (PROVENTIL HFA;VENTOLIN HFA) 108 (90 Base) MCG/ACT inhaler TAKE 2 PUFFS BY MOUTH EVERY 6 HOURS AS NEEDED FOR WHEEZE OR SHORTNESS OF BREATH Patient taking differently: Inhale 2 puffs into the lungs every 4 (four) hours as needed  for wheezing.  05/06/18   Henson, Vickie L, NP-C  apixaban (ELIQUIS) 5 MG TABS tablet Take 1 tablet (5 mg total) by mouth 2 (two) times daily. 04/14/19   Tysinger, Camelia Eng, PA-C  buPROPion (WELLBUTRIN SR) 100 MG 12 hr tablet Take 1 tablet (100 mg total) by mouth daily. 05/05/18   Denita Lung, MD  glucose blood (ONE TOUCH ULTRA TEST) test strip USE AS INSTRUCTED 06/27/18   Denita Lung, MD  Lancets (ACCU-CHEK SOFT [facility]) lancets Use as instructed 06/24/18   Denita Lung, MD  lisinopril-hydrochlorothiazide (ZESTORETIC) 10-12.5 MG tablet Take 1  tablet by mouth daily. 04/14/19   Tysinger, Camelia Eng, PA-C  Multiple Vitamins-Minerals (MULTIVITAMIN WITH MINERALS) tablet Take 1 tablet by mouth every morning.     [provider]  oxybutynin (DITROPAN XL) 10 MG 24 hr tablet Take 1 tablet (10 mg total) by mouth at bedtime. 09/05/18   Denita Lung, MD  PARoxetine (PAXIL) 20 MG tablet Take 1 tablet (20 mg total) by mouth daily. 05/05/18   Denita Lung, MD    Family History Family History  Problem Relation Age of Onset  . Arthritis Mother   . Heart disease Father     Social History Social History   Tobacco Use  . Smoking status: Never Smoker  . Smokeless tobacco: Never Used  Substance Use Topics  . Alcohol use: No  . Drug use: No     Allergies   Patient has no known allergies.   Review of Systems Review of Systems  Neurological: Positive for speech difficulty.       Difficulty walking  Hematological: Bruises/bleeds easily.  All other systems reviewed and are negative.    Physical Exam Updated Vital Signs BP 140/77   Pulse 62   Temp 98.5 F (36.9 C) (Oral)   Resp 18   LMP 01/26/2011   SpO2 98%   Physical Exam Vitals signs and nursing note reviewed.  Constitutional:      General: She is not in acute distress.    Appearance: She is well-developed.     Comments: Not in acute distress.  HENT:     Head: Normocephalic and atraumatic.  Eyes:     Extraocular Movements: Extraocular movements intact.     Conjunctiva/sclera: Conjunctivae normal.     Pupils: Pupils are equal, round, and reactive to light.     Comments: EOMI and PERRLA.  No nystagmus.  Neck:     Musculoskeletal: Normal range of motion and neck supple.  Cardiovascular:     Rate and Rhythm: Normal rate and regular rhythm.     Pulses: Normal pulses.  Pulmonary:     Effort: Pulmonary effort is normal. No respiratory distress.     Breath sounds: Normal breath sounds. No wheezing.  Abdominal:     General: There is no distension.      Palpations: Abdomen is soft. There is no mass.     Tenderness: There is no abdominal tenderness. There is no guarding or rebound.  Musculoskeletal: Normal range of motion.     Comments: Strength and sensation intact x4.  Radial pedal pulses intact bilaterally.  Skin:    General: Skin is warm and dry.     Capillary Refill: Capillary refill takes less than 2 seconds.  Neurological:     Mental Status: She is alert and oriented to person, place, and time.     GCS: GCS eye subscore is 4. GCS verbal subscore is 5. GCS motor subscore  is 6.     Cranial Nerves: Cranial nerves are intact.     Sensory: Sensation is intact.     Motor: Motor function is intact.     Coordination: Finger-Nose-Finger Test and Heel to [place]field Test normal.     Gait: Gait abnormal.     Comments: Ataxic gait requiring patient to hold on for assistance.  Patient alert and oriented, but occasionally having difficulty with word finding.  Occasionally slurring a word or 2. GCS of 15.  CN intact.  Nose to finger intact.  Fine movement and coordination intact.  Negative pronator drift.      ED Treatments / Results  Labs (all labs ordered are listed, but only abnormal results are displayed) Labs Reviewed  BASIC METABOLIC PANEL - Abnormal; Notable for the following components:      Result Value   Glucose, Bld 114 (*)    All other components within normal limits  CBC - Abnormal; Notable for the following components:   Hemoglobin 15.1 (*)    HCT 46.9 (*)    All other components within normal limits  SARS CORONAVIRUS 2 (HOSPITAL ORDER, [place] [facility])  URINALYSIS, ROUTINE W REFLEX MICROSCOPIC  CBG MONITORING, ED    EKG None  Radiology No results found.  Procedures Procedures (including critical care time)  Medications Ordered in ED Medications  sodium chloride flush (NS) 0.9 % injection 3 mL (has no administration in time range)     Initial Impression / Assessment and Plan / ED Course  I  have reviewed the triage vital signs and the nursing notes.  Pertinent labs & imaging results that were available during my care of the patient were reviewed by me and considered in my medical decision making (see chart for details).        Patient presenting for evaluation of frequent falls and speech abnormality.  Physical exam concerning, patient with difficulty walking and difficulty with word finding.  Words are sometimes slurred.  This began in the setting of new onset A. fib, concern for stroke.  As she has also fallen and hit her head since starting blood thinners, will obtain CT head without to rule out bleed.  Will obtain basic labs, EKG, and MRI.  Case discussed with attending, Dr. Laverta Baltimore evaluated patient.  Recommends consulting with neuro after CT.   Discussed with [place] from neurology who recommends an MRI, but would not formally consult at this time.  If MRI is positive for stroke, recommends formal consultation with neuro and admit to hospitalist.  If MRI is negative, consider need for PT/OT.  Patient signed out to [facility], PA-C for f/u on MRI.   Final Clinical Impressions(s) / ED Diagnoses   Final diagnoses:  None    ED Discharge Orders    None       Franchot Heidelberg, PA-C 04/19/19 1627    Long, Wonda Olds, MD 04/20/19 [phone]

## 2019-04-19 NOTE — ED Provider Notes (Signed)
Physical Exam  BP (!) 113/57   Pulse 63   Temp 98.5 F (36.9 C) (Oral)   Resp 17   LMP 01/26/2011   SpO2 93%   Physical Exam Vitals signs and nursing note reviewed.  Constitutional:      General: She is not in acute distress.    Appearance: She is well-developed. She is not diaphoretic.  HENT:     Head: Normocephalic and atraumatic.  Eyes:     General: No scleral icterus.    Conjunctiva/sclera: Conjunctivae normal.  Neck:     Musculoskeletal: Normal range of motion.  Pulmonary:     Effort: Pulmonary effort is normal. No respiratory distress.  Skin:    Findings: No rash.  Neurological:     Mental Status: She is alert.     ED Course/Procedures     Procedures  MDM  Care handed off from previous provider PA [place].  Please see their note for further detail.  Briefly, patient presents to ED for difficulty walking.  For the past several weeks she has difficulty walking, difficulty finding words.  She saw her PCP on 04/14/2019 and was found to be a new onset A. fib.  She was started on anticoagulation and encouraged to follow-up with A. fib clinic.  She was there today and sent to the ED due to these ongoing concerns.  She has had several falls since beginning anticoagulation with the most recent one being 3 days ago.  Physical exam findings show ataxic gait, GCS of 15, strength symmetrical in bilateral upper and lower extremities.  No pronator drift.  CT is negative for acute abnormality.  Lab work is reassuring.  MRI of the brain is pending.  If showing stroke, will need to be admitted to hospitalist with neurology consult.  If negative will place consult for home health aide, PT and OT.  Ct Head Wo Contrast  Result Date: 04/19/2019 CLINICAL DATA:  Ataxia, stroke suspected, head trauma EXAM: CT HEAD WITHOUT CONTRAST TECHNIQUE: Contiguous axial images were obtained from the base of the skull through the vertex without intravenous contrast. COMPARISON:  Brain MRI 05/24/2017  FINDINGS: Brain: There is no acute intracranial hemorrhage or demarcated territorial infarction. No intracranial mass, midline shift or extra-axial collection. Vascular: Scattered atherosclerotic calcification of the carotid artery siphons. No definite hyperdense vessel. Skull: No calvarial fracture. Sinuses/Orbits: The orbits are unremarkable. Moderate mucosal thickening and frothy secretions within the left maxillary sinus. Small right ethmoid sinus osteoma. The mastoid air cells are well aerated. IMPRESSION: - No evidence of acute intracranial abnormality. Consider brain MRI for further evaluation if persistent concern for acute infarction. - Left maxillary sinusitis. Electronically Signed   By: Kellie Simmering   On: 04/19/2019 15:29   Mr Brain Wo Contrast  Result Date: 04/19/2019 CLINICAL DATA:  Dizziness and falling over the last several weeks. Balance and gait disturbance. Atrial fibrillation. EXAM: MRI HEAD WITHOUT CONTRAST TECHNIQUE: Multiplanar, multiecho pulse sequences of the brain and surrounding structures were obtained without intravenous contrast. COMPARISON:  Head CT same day FINDINGS: Brain: Diffusion imaging does not show any acute or subacute infarction. The brainstem and cerebellum are normal. Cerebral hemispheres show minimal chronic small-vessel ischemic change of the white matter, often seen at this age. No cortical or large vessel territory infarction. No CP angle lesion seen. No mass lesion, hemorrhage, hydrocephalus or extra-axial collection. Vascular: Major vessels at the base of the brain show flow. Diminutive posterior circulation probably secondary to fetal origin of the posterior cerebral arteries.  Skull and upper cervical spine: Negative. Sinuses/Orbits: Left maxillary sinusitis. Other sinuses are clear. Mastoids are clear. Orbits are negative. Other: None IMPRESSION: No acute or significant brain finding. Normal study with exception of a few punctate foci of T2 and FLAIR signal  within the white matter consistent with minimal small vessel change. Left maxillary sinusitis. Electronically Signed   By: Nelson Chimes M.D.   On: 04/19/2019 17:59   MRI is negative for acute abnormality.  I have placed a consult for case management, social work and place a face-to-face order for home health aide.  Patient is comfortable with discharge home.         Delia Heady, PA-C 04/19/19 1843    Gareth Morgan, MD 04/21/19 [phone]

## 2019-04-19 NOTE — ED Notes (Signed)
Patient Alert and oriented to baseline. Stable and ambulatory to baseline. Patient verbalized understanding of the discharge instructions.  Patient belongings were taken by the patient.   

## 2019-04-19 NOTE — Discharge Instructions (Addendum)
Continue your home medications as previously prescribed. I have placed a consult for home health, PT and OT to evaluate you. Return to the ED if you start to experience worsening symptoms, additional injuries or falls, numbness in arms or legs, chest pain or shortness of breath.

## 2019-04-21 ENCOUNTER — Encounter: Payer: Self-pay | Admitting: Medical

## 2019-04-21 ENCOUNTER — Other Ambulatory Visit: Payer: Self-pay

## 2019-04-21 ENCOUNTER — Telehealth: Payer: Self-pay | Admitting: Medical

## 2019-04-21 ENCOUNTER — Ambulatory Visit (INDEPENDENT_AMBULATORY_CARE_PROVIDER_SITE_OTHER): Payer: Managed Care, Other (non HMO) | Admitting: Medical

## 2019-04-21 VITALS — BP 138/108 | HR 67 | Temp 98.1°F | Ht 72.0 in | Wt 279.4 lb

## 2019-04-21 DIAGNOSIS — I8390 Asymptomatic varicose veins of unspecified lower extremity: Secondary | ICD-10-CM

## 2019-04-21 DIAGNOSIS — R4701 Aphasia: Secondary | ICD-10-CM | POA: Diagnosis not present

## 2019-04-21 DIAGNOSIS — E1159 Type 2 diabetes mellitus with other circulatory complications: Secondary | ICD-10-CM | POA: Diagnosis not present

## 2019-04-21 DIAGNOSIS — H9190 Unspecified hearing loss, unspecified ear: Secondary | ICD-10-CM

## 2019-04-21 DIAGNOSIS — R270 Ataxia, unspecified: Secondary | ICD-10-CM

## 2019-04-21 DIAGNOSIS — R0683 Snoring: Secondary | ICD-10-CM

## 2019-04-21 DIAGNOSIS — E119 Type 2 diabetes mellitus without complications: Secondary | ICD-10-CM

## 2019-04-21 DIAGNOSIS — F341 Dysthymic disorder: Secondary | ICD-10-CM

## 2019-04-21 DIAGNOSIS — I4891 Unspecified atrial fibrillation: Secondary | ICD-10-CM | POA: Diagnosis not present

## 2019-04-21 DIAGNOSIS — I10 Essential (primary) hypertension: Secondary | ICD-10-CM

## 2019-04-21 DIAGNOSIS — I152 Hypertension secondary to endocrine disorders: Secondary | ICD-10-CM

## 2019-04-21 LAB — NOVEL CORONAVIRUS, NAA: SARS-CoV-2, NAA: NOT DETECTED

## 2019-04-21 NOTE — Telephone Encounter (Signed)
As a follow up to Friday 04/21/19,   I want to get her set up for sleep study in home/SNAP Refer to neurology And make sure she has f/u with Afib clinic

## 2019-04-21 NOTE — Progress Notes (Signed)
Subjective: Chief Complaint  Patient presents with  . Memory Loss  . Dizziness    sob    She is here today with her husband for follow-up.  I saw her on April 14, 2019 initially for dizziness and new onset atrial fibrillation.  She then ended up seeing the A. fib clinic at home July 15 and was subsequently sent to the emergency department for eval for possible stroke and further evaluation of the A. fib.  She had evaluation there and was told to follow-up with [place] after initial evaluation and stroke ruled out.  Her main concerns today is that she continues to feel dizzy and loopy.  She continues to have trouble with coordination between her brain and arms and not feeling herself.  She only has a note writing her out of work through the end of this week and is worried that she is not safe to drive or go back to work next week.  She has no new symptoms since last week but continues to have dizziness, continues to have trouble finding words at times or making clear thoughts at times.  Her processing speed seems off.  At times she may say a number or word and meaning 1 thing and will eventually get the right number her words but it does not initially come out right.  She denies incontinence that is new although she has had some ongoing urinary incontinence long-term.  She is not currently taking the oxybutynin listed in her chart.  Although she gets a little short of breath going to the bathroom or bending over, she is not currently using her albuterol  She denies any new chest pain or palpitations.  She is concerned about sleep apnea given history of snoring, she does sometimes have pauses in her sleep according to her husband.  She has gained 30 pounds since her last sleep apnea test 4 years ago.  Denies history of vertigo.  From her visit on July 10, she had reported she had not been able to keep balance in check for almost 2 weeks.  Feels loopy when she gets up in the morning.  Has felt  wobbly.   Been on vacation this past week.   Feels like her reaction time has been off as well.  The dizziness has been constant.   No dyspnea.  Has had some indigestion.   No palpitations.   Does get chest discomfort and dyspneic if bending over to do a chore.   No swelling in her legs.   Has incontinence of urine , but this is not new.  No bowel incontinence.   husband says sometimes she may say something that doesn't come out right.   No slurred speech.   Sometimes cant bring up the right word.  No unilateral weakness, numbness.   No facial weakness.  No vision change.  She is diabetic and checking glucose.   Diagnosed with diabetes back in December 2019.   Has been eating healthier of late, drinking more water.  Not currently taking diabetes medication.  Glucose has been running 150s fasting.  Average has been 150s.    Suppose to have hearing aids, but hasn't been wearing due to need to replace the battery.    She notes history of heart arrhythmia in the past.  Has seen cardiology in the past for this.  No other c/o.   Past Medical History:  Diagnosis Date  . Adjustment insomnia    SHIFT WORK  . Allergy  RHINITIS  . Atrial flutter [facility]) 02/26/15   [facility] [place]  . Bursitis    right hip  . Migraine headache   . Mobitz type 2 second degree heart block   . Obesity    Current Outpatient Medications on File Prior to Visit  Medication Sig Dispense Refill  . acetaminophen (TYLENOL ARTHRITIS PAIN) 650 MG CR tablet Take 650 mg by mouth every 8 (eight) hours as needed for pain.    Marland Kitchen albuterol (PROVENTIL HFA;VENTOLIN HFA) 108 (90 Base) MCG/ACT inhaler TAKE 2 PUFFS BY MOUTH EVERY 6 HOURS AS NEEDED FOR WHEEZE OR SHORTNESS OF BREATH (Patient taking differently: Inhale 2 puffs into the lungs every 4 (four) hours as needed for wheezing. ) 6.7 Inhaler 0  . apixaban (ELIQUIS) 5 MG TABS tablet Take 1 tablet (5 mg total) by mouth 2 (two) times daily. 60 tablet 2  . buPROPion (WELLBUTRIN  SR) 100 MG 12 hr tablet Take 1 tablet (100 mg total) by mouth daily. 90 tablet 3  . lisinopril-hydrochlorothiazide (ZESTORETIC) 10-12.5 MG tablet Take 1 tablet by mouth daily. 30 tablet 0  . Multiple Vitamins-Minerals (MULTIVITAMIN WITH MINERALS) tablet Take 1 tablet by mouth every morning.     Marland Kitchen oxybutynin (DITROPAN XL) 10 MG 24 hr tablet Take 1 tablet (10 mg total) by mouth at bedtime. 90 tablet 3  . PARoxetine (PAXIL) 20 MG tablet Take 1 tablet (20 mg total) by mouth daily. 90 tablet 3  . glucose blood (ONE TOUCH ULTRA TEST) test strip USE AS INSTRUCTED 100 each 12  . Lancets (ACCU-CHEK SOFT TOUCH) lancets Use as instructed 100 each 12   No current facility-administered medications on file prior to visit.    ROS as in subjective    Objective: BP (!) 138/108   Pulse 67   Temp 98.1 F (36.7 C) (Oral)   Ht 6' (1.829 m)   Wt 279 lb 6.4 oz (126.7 kg)   LMP 01/26/2011   SpO2 97%   BMI 37.89 kg/m   Wt Readings from Last 3 Encounters:  04/21/19 279 lb 6.4 oz (126.7 kg)  04/19/19 283 lb (128.4 kg)  04/14/19 283 lb 9.6 oz (128.6 kg)   BP Readings from Last 3 Encounters:  04/21/19 (!) 138/108  04/19/19 (!) 113/57  04/19/19 (!) 142/84      General appearance: alert, WD/WN, white female Skin: warm, dry HEENT: normocephalic, sclerae anicteric, PERRLA, EOMi, nares patent, no discharge or erythema, pharynx normal Oral cavity: MMM, no lesions Neck: supple, no lymphadenopathy, no thyromegaly, no masses, no bruits or JVD Heart: irregular, normal S1, S2, no murmurs Lungs: CTA bilaterally, no wheezes, rhonchi, or rales Abdomen: +bs, soft, non tender, non distended, no masses, no hepatomegaly, no splenomegaly Back: non tender Musculoskeletal: nontender, no swelling, no obvious deformity Extremities: + Moderate varicose veins bilateral lower extremities, no calf tenderness no palpable cord, negative Homans, no edema, no cyanosis, no clubbing Pulses: 2+ symmetric, upper and lower  extremities, normal cap refill Neurological: + She seems off balance at times, seems to lean with Romberg, she has 2 reach out to grab table and walking in general so we did not attempt heel-to-toe.  Alert, oriented x 3, CN2-12 intact, strength normal upper extremities and lower extremities, sensation normal throughout, DTRs 2+ throughout,  Psychiatric: normal affect, behavior normal, pleasant , but seems a little anxious    Assessment: Encounter Diagnoses  Name Primary?  . Aphasia Yes  . Atrial fibrillation, unspecified type ([place])   . Hypertension associated with  diabetes ([place])   . Varicose veins of lower extremity, unspecified laterality, unspecified whether complicated   . Type 2 diabetes mellitus without complication, without long-term current use of insulin ([place])   . Hearing loss, unspecified hearing loss type, unspecified laterality   . Ataxia   . Dysthymia   . Snoring      Plan: I reviewed back over the course the last week's visits including my visit with her here, the A. fib clinic visit the emergency department visit and eval including head imaging, labs, medication reconciliation.  I called and spoke to her PCP/ my supervising physician Dr. Redmond School today about her case as well.  At this point she has no new symptoms in the last few days.  I will go ahead and write her a note out for 2 weeks to give [place] time to get her back in with the A. fib clinic.  We will also make referral to neurology.  She will continue the current medications including lisinopril HCT 10/12.5 mg.  She will continue Eliquis 5 mg twice daily started last week.  She will continue her other medicines as usual.  Of note she has not been taking oxybutynin or albuterol of late.  She can certainly use albuterol if needed.  I advise she monitor her blood pressures over the weekend and call back Monday with an update since her pressure was elevated today.  We will look into getting an updated sleep study since she  has gained 30 pounds since last sleep study 4 years ago.  This could be contributing to her issues.  I advised if any new symptoms over the weekend worrisome for stroke to call 911.    Audi was seen today for memory loss and dizziness.  Diagnoses and all orders for this visit:  Aphasia -     Ambulatory referral to Neurology  Atrial fibrillation, unspecified type ([place])  Hypertension associated with diabetes ([place])  Varicose veins of lower extremity, unspecified laterality, unspecified whether complicated  Type 2 diabetes mellitus without complication, without long-term current use of insulin ([facility])  Hearing loss, unspecified hearing loss type, unspecified laterality  Ataxia  Dysthymia  Snoring

## 2019-04-24 NOTE — Telephone Encounter (Signed)
Referrals put in epic and proficient.

## 2019-04-26 ENCOUNTER — Telehealth: Payer: Self-pay | Admitting: Medical

## 2019-04-26 NOTE — Telephone Encounter (Signed)
Received from from [facility]. Sending back to Cindi per offie protocol.

## 2019-04-26 NOTE — Telephone Encounter (Signed)
Forms sent back to [facility] in my purple folder to be completed.

## 2019-04-26 NOTE — Telephone Encounter (Signed)
She sent my chart message to Juliann Pulse today about 3 falls in past few days.   Needs to be seen by neurology and back with Afib clinic ASAP.   I know the referrals have been made, but lets baby sit these referrals, get her in quick.  Please make sure Dr. Redmond School is aware too.

## 2019-04-27 NOTE — Telephone Encounter (Signed)
Just FYI. Per shane referral to neuro has been put in. [facility]

## 2019-04-27 NOTE — Telephone Encounter (Signed)
I will work on these Monday as I am not in office today or tomorrow.  I want to make sure Dr. Redmond School is aware of her e-mail in yesterday to [facility] about falling and ongoing dizziness.    We have already got referrals to neurology and I sent earlier email about speeding up follow up with Afib clinic.  Not sure if Dr. Redmond School has other suggestions for today for her.

## 2019-04-27 NOTE — Telephone Encounter (Signed)
Laurie Mckinney wanted you to be aware of pt dizzy feeling pt already has a referral to Neuro . Just FYI. [name]

## 2019-04-30 ENCOUNTER — Other Ambulatory Visit: Payer: Self-pay

## 2019-04-30 ENCOUNTER — Emergency Department (HOSPITAL_COMMUNITY)
Admission: EM | Admit: 2019-04-30 | Discharge: 2019-04-30 | Disposition: A | Discharge status: Home / Self Care | Payer: Managed Care, Other (non HMO) | Attending: Emergency Medicine | Admitting: Emergency Medicine

## 2019-04-30 ENCOUNTER — Encounter (HOSPITAL_COMMUNITY): Payer: Self-pay | Admitting: Emergency Medicine

## 2019-04-30 DIAGNOSIS — I4891 Unspecified atrial fibrillation: Secondary | ICD-10-CM | POA: Diagnosis not present

## 2019-04-30 DIAGNOSIS — R42 Dizziness and giddiness: Secondary | ICD-10-CM | POA: Diagnosis not present

## 2019-04-30 DIAGNOSIS — I10 Essential (primary) hypertension: Secondary | ICD-10-CM | POA: Diagnosis not present

## 2019-04-30 DIAGNOSIS — R279 Unspecified lack of coordination: Secondary | ICD-10-CM

## 2019-04-30 DIAGNOSIS — Z7984 Long term (current) use of oral hypoglycemic drugs: Secondary | ICD-10-CM | POA: Diagnosis not present

## 2019-04-30 DIAGNOSIS — J45909 Unspecified asthma, uncomplicated: Secondary | ICD-10-CM | POA: Diagnosis not present

## 2019-04-30 DIAGNOSIS — Z79899 Other long term (current) drug therapy: Secondary | ICD-10-CM | POA: Insufficient documentation

## 2019-04-30 DIAGNOSIS — R5383 Other fatigue: Secondary | ICD-10-CM | POA: Insufficient documentation

## 2019-04-30 DIAGNOSIS — Z7901 Long term (current) use of anticoagulants: Secondary | ICD-10-CM | POA: Diagnosis not present

## 2019-04-30 DIAGNOSIS — E1169 Type 2 diabetes mellitus with other specified complication: Secondary | ICD-10-CM | POA: Insufficient documentation

## 2019-04-30 HISTORY — DX: Unspecified asthma, uncomplicated: J45.909

## 2019-04-30 HISTORY — DX: Essential (primary) hypertension: I10

## 2019-04-30 HISTORY — DX: Type 2 diabetes mellitus without complications: E11.9

## 2019-04-30 LAB — BASIC METABOLIC PANEL
Anion gap: 9 (ref 5–15)
BUN: 11 mg/dL (ref 6–20)
CO2: 28 mmol/L (ref 22–32)
Calcium: 9.4 mg/dL (ref 8.9–10.3)
Chloride: 103 mmol/L (ref 98–111)
Creatinine, Ser: 0.7 mg/dL (ref 0.44–1.00)
GFR calc Af Amer: >60 mL/min (ref >60)
GFR calc non Af Amer: >60 mL/min (ref >60)
Glucose, Bld: 108 mg/dL — ABNORMAL HIGH (ref 70–99)
Potassium: 4 mmol/L (ref 3.5–5.1)
Sodium: 140 mmol/L (ref 135–145)

## 2019-04-30 LAB — URINALYSIS, ROUTINE W REFLEX MICROSCOPIC
Bacteria, UA: NONE SEEN
Bilirubin Urine: NEGATIVE
Glucose, UA: NEGATIVE mg/dL
Ketones, ur: NEGATIVE mg/dL
Leukocytes,Ua: NEGATIVE
Nitrite: NEGATIVE
Protein, ur: NEGATIVE mg/dL
Specific Gravity, Urine: 1.005 (ref 1.005–1.030)
pH: 6 (ref 5.0–8.0)

## 2019-04-30 LAB — CBC WITH DIFFERENTIAL/PLATELET
Abs Immature Granulocytes: 0.01 10*3/uL (ref 0.00–0.07)
Basophils Absolute: 0 10*3/uL (ref 0.0–0.1)
Basophils Relative: 0 %
Eosinophils Absolute: 0.1 10*3/uL (ref 0.0–0.5)
Eosinophils Relative: 1 %
HCT: 44.2 % (ref 36.0–46.0)
Hemoglobin: 14.5 g/dL (ref 12.0–15.0)
Immature Granulocytes: 0 %
Lymphocytes Relative: 24 %
Lymphs Abs: 2.3 10*3/uL (ref 0.7–4.0)
MCH: 31.2 pg (ref 26.0–34.0)
MCHC: 32.8 g/dL (ref 30.0–36.0)
MCV: 95.1 fL (ref 80.0–100.0)
Monocytes Absolute: 0.8 10*3/uL (ref 0.1–1.0)
Monocytes Relative: 9 %
Neutro Abs: 6.2 10*3/uL (ref 1.7–7.7)
Neutrophils Relative %: 66 %
Platelets: 282 10*3/uL (ref 150–400)
RBC: 4.65 MIL/uL (ref 3.87–5.11)
RDW: 12.6 % (ref 11.5–15.5)
WBC: 9.4 10*3/uL (ref 4.0–10.5)
nRBC: 0 % (ref 0.0–0.2)

## 2019-04-30 MED ORDER — MECLIZINE HCL 25 MG PO TABS
25.0000 mg | ORAL_TABLET | Freq: Once | ORAL | Status: AC
  Administered 2019-04-30: 20:00:00 25 mg via ORAL
  Filled 2019-04-30: qty 1

## 2019-04-30 MED ORDER — APIXABAN 5 MG PO TABS
5.0000 mg | ORAL_TABLET | Freq: Once | ORAL | Status: AC
  Administered 2019-04-30: 5 mg via ORAL
  Filled 2019-04-30: qty 1

## 2019-04-30 MED ORDER — MECLIZINE HCL 25 MG PO TABS: 25.0000 mg | ORAL_TABLET | Freq: Three times a day (TID) | ORAL | 1 refills | Status: DC | PRN

## 2019-04-30 NOTE — ED Provider Notes (Signed)
[facility] EMERGENCY DEPARTMENT Provider Note   CSN: [id number] Arrival date & time: 04/30/19  1607    History   Chief Complaint Chief Complaint  Patient presents with  . Stroke Symptoms    HPI Laurie Mckinney is a 58 y.o. female w PMHx a fib, DM, OSA, DOE, presenting to the emergency department with ongoing symptoms of dizziness, fatigue, and disequilibrium.  She has been evaluated recently for this on 04/19/2019 with negative CT and MRI imaging.  She was also recently diagnosed with atrial fibrillation, on Eliquis.  She is followed closely by her PCP, PA Tysinger.  She has an outpatient neurology appointment made for August 15, and is also getting an appointment made at the [facility] with coordination of her PCP office.  Patient reports her symptoms have not changed or worsened since her last ED visit and visits with her PCP, with the exception of her generalized fatigue.  She presents today for persistence of symptoms.  She does endorse room spinning dizziness and problems with coordination.  The dizziness is worse when she stands up, better at rest when she lays down.  Sometimes it makes her feel nauseous.  She states her mother has a history of vertigo, she has never been diagnosed or treated for this.  She also reports she has been having worsening sleep apnea since the start of the year and had 30 pound weight gain.  She is scheduled for sleep study for evaluation of this and wonders if this could be contributing to her fatigue.    The history is provided by the patient and medical records.    Past Medical History:  Diagnosis Date  . Adjustment insomnia    SHIFT WORK  . Allergy    RHINITIS  . Asthma   . Atrial flutter [facility]) 02/26/15   [facility] [place]  . Bursitis    right hip  . Diabetes mellitus without complication ([place])   . Hypertension   . Migraine headache   . Mobitz type 2 second degree heart block   . Obesity     Patient Active  Problem List   Diagnosis Date Noted  . Snoring 04/21/2019  . Varicose veins of lower extremity 04/14/2019  . Cough 04/14/2019  . Atrial fibrillation ([place]) 04/14/2019  . DOE (dyspnea on exertion) 04/14/2019  . Disequilibrium 04/13/2019  . Ataxia 04/13/2019  . Hearing loss 04/13/2019  . Aphasia 04/13/2019  . Type 2 diabetes mellitus without complication, without long-term current use of insulin ([place]) 09/29/2018  . Second degree heart block 05/05/2018  . Urinary incontinence 05/05/2018  . Chronic low back pain without sciatica 05/05/2018  . Hypertension associated with diabetes ([place]) 09/20/2017  . Actinic keratosis 02/24/2017  . Dysthymia 01/10/2016  . History of endometrial cancer 02/05/2015    Class: Stage 1  . History of migraine headaches 03/18/2011  . Allergic rhinitis due to pollen 03/18/2011  . Obesity (BMI 30-39.9) 03/18/2011    Past Surgical History:  Procedure Laterality Date  . ABDOMINAL HYSTERECTOMY    . COLONOSCOPY  2011   Dr.Brodie  . ROBOTIC ASSISTED TOTAL HYSTERECTOMY WITH BILATERAL SALPINGO OOPHERECTOMY Bilateral 02/05/2015   Procedure: ROBOTIC ASSISTED TOTAL LAPAROSCOPIC HYSTERECTOMY WITH BILATERAL SALPINGO OOPHORECTOMY;  Surgeon: Everitt Amber, MD;  Location: [facility];  Service: Gynecology;  Laterality: Bilateral;     OB History   No obstetric history on file.      Home Medications    Prior to Admission medications   Medication  Sig Start Date End Date Taking? Authorizing Provider  acetaminophen (TYLENOL ARTHRITIS PAIN) 650 MG CR tablet Take 650 mg by mouth every 8 (eight) hours as needed for pain.    [provider]  albuterol (PROVENTIL HFA;VENTOLIN HFA) 108 (90 Base) MCG/ACT inhaler TAKE 2 PUFFS BY MOUTH EVERY 6 HOURS AS NEEDED FOR WHEEZE OR SHORTNESS OF BREATH Patient taking differently: Inhale 2 puffs into the lungs every 4 (four) hours as needed for wheezing.  05/06/18   Henson, Vickie L, NP-C  apixaban (ELIQUIS) 5 MG TABS tablet Take 1 tablet (5 mg  total) by mouth 2 (two) times daily. 04/14/19   Tysinger, Camelia Eng, PA-C  buPROPion (WELLBUTRIN SR) 100 MG 12 hr tablet Take 1 tablet (100 mg total) by mouth daily. 05/05/18   Denita Lung, MD  glucose blood (ONE TOUCH ULTRA TEST) test strip USE AS INSTRUCTED 06/27/18   Denita Lung, MD  Lancets (ACCU-CHEK SOFT [facility]) lancets Use as instructed 06/24/18   Denita Lung, MD  lisinopril-hydrochlorothiazide (ZESTORETIC) 10-12.5 MG tablet Take 1 tablet by mouth daily. 04/14/19   Tysinger, Camelia Eng, PA-C  meclizine (ANTIVERT) 25 MG tablet Take 1 tablet (25 mg total) by mouth 3 (three) times daily as needed for dizziness. 04/30/19   Kensley Valladares, [place] N, PA-C  Multiple Vitamins-Minerals (MULTIVITAMIN WITH MINERALS) tablet Take 1 tablet by mouth every morning.     [provider]  oxybutynin (DITROPAN XL) 10 MG 24 hr tablet Take 1 tablet (10 mg total) by mouth at bedtime. 09/05/18   Denita Lung, MD  PARoxetine (PAXIL) 20 MG tablet Take 1 tablet (20 mg total) by mouth daily. 05/05/18   Denita Lung, MD    Family History Family History  Problem Relation Age of Onset  . Arthritis Mother   . Heart disease Father     Social History Social History   Tobacco Use  . Smoking status: Never Smoker  . Smokeless tobacco: Never Used  Substance Use Topics  . Alcohol use: No  . Drug use: No     Allergies   Patient has no known allergies.   Review of Systems Review of Systems  Constitutional: Positive for fatigue. Negative for fever.  Eyes: Negative for visual disturbance.  Neurological: Positive for dizziness. Negative for headaches.  All other systems reviewed and are negative.    Physical Exam Updated Vital Signs BP (!) 136/126 (BP Location: Right Arm)   Pulse (!) 52   Temp 98 F (36.7 C) (Oral)   Resp 18   LMP 01/26/2011   SpO2 98%   Physical Exam Vitals signs and nursing note reviewed.  Constitutional:      General: She is not in acute distress.    Appearance: She is  well-developed. She is not ill-appearing.  HENT:     Head: Normocephalic and atraumatic.  Eyes:     Extraocular Movements: Extraocular movements intact.     Conjunctiva/sclera: Conjunctivae normal.     Pupils: Pupils are equal, round, and reactive to light.  Cardiovascular:     Rate and Rhythm: Normal rate and regular rhythm.  Pulmonary:     Effort: Pulmonary effort is normal. No respiratory distress.     Breath sounds: Normal breath sounds.  Abdominal:     General: Bowel sounds are normal.     Palpations: Abdomen is soft.     Tenderness: There is no abdominal tenderness. There is no guarding or rebound.  Musculoskeletal:     Right lower leg:  No edema.     Left lower leg: No edema.  Skin:    General: Skin is warm.  Neurological:     General: No focal deficit present.     Mental Status: She is alert and oriented to person, place, and time.     Comments: Mental Status:  Alert, oriented, thought content appropriate, able to give a coherent history. Speech fluent without evidence of aphasia. Able to follow 2 step commands without difficulty.  Cranial Nerves:  II:  Peripheral visual fields grossly normal, pupils equal, round, reactive to light III,IV, VI: ptosis not present, extra-ocular motions intact bilaterally  V,VII: smile symmetric, facial light touch sensation equal VIII: hearing grossly normal to voice  X: uvula elevates symmetrically  XI: bilateral shoulder shrug symmetric and strong XII: midline tongue extension without fassiculations Motor:  Normal tone. 5/5 in upper and lower extremities bilaterally including strong and equal grip strength and dorsiflexion/plantar flexion Sensory: grossly normal in all extremities.  Cerebellar: normal finger-to-nose with bilateral upper extremities Gait: normal gait and balance CV: distal pulses palpable throughout    Psychiatric:        Behavior: Behavior normal.      ED Treatments / Results  Labs (all labs ordered are listed,  but only abnormal results are displayed) Labs Reviewed  BASIC METABOLIC PANEL - Abnormal; Notable for the following components:      Result Value   Glucose, Bld 108 (*)    All other components within normal limits  URINALYSIS, ROUTINE W REFLEX MICROSCOPIC - Abnormal; Notable for the following components:   Color, Urine STRAW (*)    Hgb urine dipstick SMALL (*)    All other components within normal limits  CBC WITH DIFFERENTIAL/PLATELET    EKG EKG Interpretation  Date/Time:  Sunday April 30 2019 16:18:26 EDT Ventricular Rate:  70 PR Interval:    QRS Duration: 97 QT Interval:  409 QTC Calculation: 442 R Axis:   15 Text Interpretation:  Atrial fibrillation Low voltage, precordial leads No significant change since last tracing Confirmed by Wandra Arthurs [phone]) on 04/30/2019 4:25:05 PM   Radiology No results found.  Procedures Procedures (including critical care time)  Medications Ordered in ED Medications  apixaban (ELIQUIS) tablet 5 mg (5 mg Oral Given 04/30/19 1839)  meclizine (ANTIVERT) tablet 25 mg (25 mg Oral Given 04/30/19 2017)     Initial Impression / Assessment and Plan / ED Course  I have reviewed the triage vital signs and the nursing notes.  Pertinent labs & imaging results that were available during my care of the patient were reviewed by me and considered in my medical decision making (see chart for details).        Patient presenting with ongoing symptoms of disequilibrium, dizziness and fatigue.  Patient reports symptoms have not changed or worsened in any way since recent visit on the 15th where she had negative CT and MRI imaging of her head.  She has a neurology appointment coming up in August and is followed by the A. fib clinic.  Today she has no focal neuro deficits and actually ambulated well with a steady gait on my exam.  She is in A. fib, however compliant on Eliquis and normal rate.  Given patient's description of symptoms, suspect there may be an  element of vertigo presents.  Will prescribe meclizine for patient to trial for symptoms and encouraged she follow-up closely outpatient with her PCP and neurologist for further evaluation regarding patient's ongoing symptoms.  Patient  can return to the ER should anything change or worsen.  Patient discussed with Dr. Darl Householder, who agrees with care plan for discharge.  Discussed results, findings, treatment and follow up. Patient advised of return precautions. Patient verbalized understanding and agreed with plan.  Final Clinical Impressions(s) / ED Diagnoses   Final diagnoses:  Dizziness  Fatigue, unspecified type  Coordination problem    ED Discharge Orders         Ordered    meclizine (ANTIVERT) 25 MG tablet  3 times daily PRN     04/30/19 2053           Eliette Drumwright, [place] N, PA-C 04/30/19 2053    Drenda Freeze, MD 04/30/19 [phone]

## 2019-04-30 NOTE — Discharge Instructions (Addendum)
Continue taking your Eliquis as prescribed, it is important you do not miss any doses of this. You can take meclizine every 8 hours as needed for dizziness and imbalance. Follow-up closely with your primary care provider and attend your neurology appointment for further evaluation of your ongoing symptoms. Return to the emergency department if you develop severe headache, sudden worsening symptoms, or new one-sided weakness, facial droop, slurred speech.

## 2019-04-30 NOTE — ED Triage Notes (Signed)
Pt states that she has stroke symptoms. She states that she was here on 04/19/2019 to be seen abt the same symptoms and these symptoms have not gone away. Pt states she is dizzy when she is up moving around. Pt denies weakness on one side or the other. Pt start eliqus this month. Denies, HA, blurred vision. Pt states she has SOB.

## 2019-05-01 ENCOUNTER — Telehealth: Payer: Self-pay | Admitting: Medical

## 2019-05-01 NOTE — Telephone Encounter (Signed)
Copy/SCAN and return disability form

## 2019-05-02 ENCOUNTER — Encounter: Payer: Self-pay | Admitting: Neurology

## 2019-05-02 ENCOUNTER — Other Ambulatory Visit: Payer: Self-pay

## 2019-05-02 ENCOUNTER — Ambulatory Visit (INDEPENDENT_AMBULATORY_CARE_PROVIDER_SITE_OTHER): Payer: Managed Care, Other (non HMO) | Admitting: Neurology

## 2019-05-02 ENCOUNTER — Telehealth: Payer: Self-pay | Admitting: Neurology

## 2019-05-02 VITALS — BP 137/86 | HR 75 | Temp 98.4°F | Ht 72.0 in | Wt 275.0 lb

## 2019-05-02 DIAGNOSIS — R0683 Snoring: Secondary | ICD-10-CM

## 2019-05-02 DIAGNOSIS — R2689 Other abnormalities of gait and mobility: Secondary | ICD-10-CM

## 2019-05-02 DIAGNOSIS — R270 Ataxia, unspecified: Secondary | ICD-10-CM

## 2019-05-02 DIAGNOSIS — G4719 Other hypersomnia: Secondary | ICD-10-CM

## 2019-05-02 DIAGNOSIS — R259 Unspecified abnormal involuntary movements: Secondary | ICD-10-CM

## 2019-05-02 DIAGNOSIS — R4701 Aphasia: Secondary | ICD-10-CM

## 2019-05-02 NOTE — Telephone Encounter (Signed)
[employer] order sent to [facility]. They will obtain the auth and reach out to the patient to schedule.  

## 2019-05-02 NOTE — Progress Notes (Signed)
Sent my chart message to pt due to no answer. Laurie Mckinney

## 2019-05-02 NOTE — Telephone Encounter (Signed)
Copied and faxed to [place] at [phone]

## 2019-05-02 NOTE — Progress Notes (Signed)
[facility]    Provider:  Dr Jaynee Eagles Requesting Provider: Chana Bode, MD Primary Care Provider:  Denita Lung, MD  CC:  Aphasia, hand-eye coordination, fatigue, body in a "loopy" state  HPI:  Laurie Mckinney is a 58 y.o. female here as requested by Dr. Glade Lloyd for aphasia.  Past medical history obesity, Mobitz type II second-degree heart block, migraines, hypertension, diabetes, a fib, asthma, adjustment insomnia.She is here with her husband. She "cannot control her body", she is wobbly, she is short of breath, she misjudges everything, she throws things and they don;t end up where she wants, her long term memory is doing well but can;t get her brain to function, poor eye-hand coordination, she feels very fatigued. Here with hr husband who provides much information. She feels her depression and anxiety, no focal weakness, no facial droop, no dysarthria. 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   Reviewed notes, labs and imaging from outside physicians, which showed: I reviewed Dr. Madelyn Brunner notes.  She was most recently seen earlier this month.  Patient reported dizziness and new onset atrial fibrillation ended up seeing A. fib clinic July 15 and was sent to the emergency room for possible stroke.  She continues to feel "dizzy and loopy".  She has trouble with coordination between her brain or arms and not feeling herself.  She only has a note writing her out of work through the end of this week and that she is not safe to drive or go back to work next week.  She continues to feel dizzy and loopy.  Trouble with concentration between her brain and arms.  Trouble finding words at times are making clear thoughts at times.  Processing speed seems off.  She is concerned about sleep apnea given history of snoring, she does have pauses in her  sleep according to her husband, she is gained 30 pounds in the last 4 years.  Personally reviewed MRI of the brain April 19, 2019 and agree with the following:No acute or significant brain finding. Normal study with exception of a few punctate foci of T2 and FLAIR signal within the white matter consistent with minimal small vessel change. There is a significant amount of stress, she works overnight, she doesn't sleep well.  TSH normal hgba1c 6.8 Cbc/cmp unremarkable B12 976  Review of Systems: Patient complains of symptoms per HPI as well as the following symptoms: stress, snoring, not enough sleep, fatigue, memory loss. Pertinent negatives and positives per HPI. All others negative.   Social History   Socioeconomic History  . Marital status: Married    Spouse name: Not on file  . Number of children: Not on file  . Years of education: Not on file  . Highest education level: Not on file  Occupational History  . Not on file  Social Needs  . Financial resource strain: Not on file  . Food insecurity    Worry: Not on file    Inability: Not on file  . Transportation needs    Medical: Not on file    Non-medical: Not on file  Tobacco Use  . Smoking status: Never Smoker  . Smokeless tobacco: Never Used  Substance and Sexual Activity  . Alcohol use: No  . Drug use: No  . Sexual activity: Yes  Lifestyle  . Physical activity    Days per week:  Not on file    Minutes per session: Not on file  . Stress: Not on file  Relationships  . Social Herbalist on phone: Not on file    Gets together: Not on file    Attends religious service: Not on file    Active member of club or organization: Not on file    Attends meetings of clubs or organizations: Not on file    Relationship status: Not on file  . Intimate partner violence    Fear of current or ex partner: Not on file    Emotionally abused: Not on file    Physically abused: Not on file    Forced sexual activity: Not on file   Other Topics Concern  . Not on file  Social History Narrative  . Not on file    Family History  Problem Relation Age of Onset  . Arthritis Mother   . Heart disease Father     Past Medical History:  Diagnosis Date  . Adjustment insomnia    SHIFT WORK  . Allergy    RHINITIS  . Asthma   . Atrial flutter [facility]) 02/26/15   [facility] [place]  . Bursitis    right hip  . Diabetes mellitus without complication ([place])   . Hypertension   . Migraine headache   . Mobitz type 2 second degree heart block   . Obesity     Patient Active Problem List   Diagnosis Date Noted  . Snoring 04/21/2019  . Varicose veins of lower extremity 04/14/2019  . Cough 04/14/2019  . Atrial fibrillation ([place]) 04/14/2019  . DOE (dyspnea on exertion) 04/14/2019  . Disequilibrium 04/13/2019  . Ataxia 04/13/2019  . Hearing loss 04/13/2019  . Aphasia 04/13/2019  . Type 2 diabetes mellitus without complication, without long-term current use of insulin ([place]) 09/29/2018  . Second degree heart block 05/05/2018  . Urinary incontinence 05/05/2018  . Chronic low back pain without sciatica 05/05/2018  . Hypertension associated with diabetes ([place]) 09/20/2017  . Actinic keratosis 02/24/2017  . Dysthymia 01/10/2016  . History of endometrial cancer 02/05/2015    Class: Stage 1  . History of migraine headaches 03/18/2011  . Allergic rhinitis due to pollen 03/18/2011  . Obesity (BMI 30-39.9) 03/18/2011    Past Surgical History:  Procedure Laterality Date  . ABDOMINAL HYSTERECTOMY    . COLONOSCOPY  2011   Dr.Brodie  . ROBOTIC ASSISTED TOTAL HYSTERECTOMY WITH BILATERAL SALPINGO OOPHERECTOMY Bilateral 02/05/2015   Procedure: ROBOTIC ASSISTED TOTAL LAPAROSCOPIC HYSTERECTOMY WITH BILATERAL SALPINGO OOPHORECTOMY;  Surgeon: Everitt Amber, MD;  Location: [facility];  Service: Gynecology;  Laterality: Bilateral;    Current Outpatient Medications  Medication Sig Dispense Refill  . acetaminophen (TYLENOL ARTHRITIS  PAIN) 650 MG CR tablet Take 650 mg by mouth every 8 (eight) hours as needed for pain.    Marland Kitchen albuterol (PROVENTIL HFA;VENTOLIN HFA) 108 (90 Base) MCG/ACT inhaler TAKE 2 PUFFS BY MOUTH EVERY 6 HOURS AS NEEDED FOR WHEEZE OR SHORTNESS OF BREATH (Patient taking differently: Inhale 2 puffs into the lungs every 4 (four) hours as needed for wheezing. ) 6.7 Inhaler 0  . apixaban (ELIQUIS) 5 MG TABS tablet Take 1 tablet (5 mg total) by mouth 2 (two) times daily. 60 tablet 2  . buPROPion (WELLBUTRIN SR) 100 MG 12 hr tablet Take 1 tablet (100 mg total) by mouth daily. 90 tablet 3  . meclizine (ANTIVERT) 25 MG tablet Take 1 tablet (25 mg total) by mouth 3 (  three) times daily as needed for dizziness. 20 tablet 1  . Multiple Vitamins-Minerals (MULTIVITAMIN WITH MINERALS) tablet Take 1 tablet by mouth every morning.     Marland Kitchen oxybutynin (DITROPAN XL) 10 MG 24 hr tablet Take 1 tablet (10 mg total) by mouth at bedtime. 90 tablet 3  . PARoxetine (PAXIL) 20 MG tablet Take 1 tablet (20 mg total) by mouth daily. 90 tablet 3  . glucose blood (ONE TOUCH ULTRA TEST) test strip USE AS INSTRUCTED 100 each 12  . Lancets (ACCU-CHEK SOFT TOUCH) lancets Use as instructed 100 each 12  . lisinopril-hydrochlorothiazide (ZESTORETIC) 10-12.5 MG tablet Take 1 tablet by mouth daily. (Patient not taking: Reported on 05/02/2019) 30 tablet 0   No current facility-administered medications for this visit.     Allergies as of 05/02/2019  . (No Known Allergies)    Vitals: BP 137/86   Pulse 75   Temp 98.4 F (36.9 C) (Oral) Comment (Src): Husband's temp 98.6  Ht 6' (1.829 m)   Wt 275 lb (124.7 kg)   LMP 01/26/2011   BMI 37.30 kg/m  Last Weight:  Wt Readings from Last 1 Encounters:  05/02/19 275 lb (124.7 kg)   Last Height:   Ht Readings from Last 1 Encounters:  05/02/19 6' (1.829 m)     Physical exam: Exam: Gen: irritable, obese, high energy           CV: RRR, no MRG. No Carotid Bruits. No peripheral edema, warm, nontender  Eyes: Conjunctivae clear without exudates or hemorrhage  Neuro: Detailed Neurologic Exam  Speech:    Speech is normal; fluent and spontaneous with normal comprehension.  Cognition:    The patient is oriented to person, place, and time;     recent and remote memory intact;     language fluent;     normal attention, concentration,     fund of knowledge Cranial Nerves:    The pupils are equal, round, and reactive to light. Attempted fundoscopic exam coul not visualize. Visual fields are full to finger confrontation. Extraocular movements are intact. Trigeminal sensation is intact and the muscles of mastication are normal. The face is symmetric. The palate elevates in the midline. Hearing intact. Voice is normal. Shoulder shrug is normal. The tongue has normal motion without fasciculations.   Coordination:    Normal finger to nose and heel to shin.  Gait:    Heel-toe and tandem gait with imbalance  Motor Observation:    No asymmetry, no atrophy, and no involuntary movements noted. Tone:    Normal muscle tone.    Posture:    Posture is normal. normal erect    Strength:    Strength is V/V in the upper and lower limbs.      Sensation: intact to LT     Reflex Exam:  DTR's:    Deep tendon reflexes in the upper and lower extremities are normal bilaterally.   Toes:    The toes are downgoing bilaterally.   Clonus:    Clonus is absent.    Assessment/Plan:  58 y.o. female here as requested by Dr. Glade Lloyd for aphasia.  Past medical history obesity, Mobitz type II second-degree heart block, migraines, hypertension, diabetes, a fib, asthma, adjustment insomnia. Per husband, . 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  There is a significant amount of stress, she works overnight, she doesn't  sleep well. Her exam is a little exaggerated, I suspect a  large emotional component. No FHx of neuromuscular disease or Huntington's or other neurodegenerative disorder.    Sleep evaluation: She is concerned about sleep apnea given history of snoring, she does have pauses in her sleep according to her husband, she is gained 30 pounds in the last 4 years.Husband reports apneic events. I highly recommend this patient come into the sleep lab has not been able to technical use the home study (tried it 4 years ago and was not successful). ESS 22.   MRI cervical spine: for her reported ataxia.  Physical therapy for balance Speech therapy for speech for aphasia   Orders Placed This Encounter  Procedures  . MR CERVICAL SPINE WO CONTRAST  . Ambulatory referral to Sleep Studies  . Ambulatory referral to Physical Therapy  . Ambulatory referral to Speech Therapy     Cc: Denita Lung, MD,  Dr. Minna Merritts, MD  [facility] [address] [place], [place] [address]  Phone [phone] Fax [phone]

## 2019-05-02 NOTE — Patient Instructions (Addendum)
Sleep evaluation - will call MRI cervical spine - will call Physical therapy - will call Speech therapy - will call Follow up 6 months  It is important to avoid accidents which may result in broken bones.  Here are a few ideas on how to make your home safer so you will be less likely to trip or fall.  1. Use nonskid mats or non slip strips in your shower or tub, on your bathroom floor and around sinks.  If you know that you have spilled water, wipe it up! 2. In the bathroom, it is important to have properly installed grab bars on the walls or on the edge of the tub.  Towel racks are NOT strong enough for you to hold onto or to pull on for support. 3. Stairs and hallways should have enough light.  Add lamps or night lights if you need ore light. 4. It is good to have handrails on both sides of the stairs if possible.  Always fix broken handrails right away. 5. It is important to see the edges of steps.  Paint the edges of outdoor steps white so you can see them better.  Put colored tape on the edge of inside steps. 6. Throw-rugs are dangerous because they can slide.  Removing the rugs is the best idea, but if they must stay, add adhesive carpet tape to prevent slipping. 7. Do not keep things on stairs or in the halls.  Remove small furniture that blocks the halls as it may cause you to trip.  Keep telephone and electrical cords out of the way where you walk. 8. Always were sturdy, rubber-soled shoes for good support.  Never wear just socks, especially on the stairs.  Socks may cause you to slip or fall.  Do not wear full-length housecoats as you can easily trip on the bottom.  9. Place the things you use the most on the shelves that are the easiest to reach.  If you use a stepstool, make sure it is in good condition.  If you feel unsteady, DO NOT climb, ask for help. 10. If a health professional advises you to use a cane or walker, do not be ashamed.  These items can keep you from falling and  breaking your bones.

## 2019-05-03 ENCOUNTER — Telehealth: Payer: Self-pay

## 2019-05-03 ENCOUNTER — Other Ambulatory Visit: Payer: Self-pay

## 2019-05-03 ENCOUNTER — Ambulatory Visit
Admission: RE | Admit: 2019-05-03 | Discharge: 2019-05-03 | Disposition: A | Discharge status: Home / Self Care | Payer: Managed Care, Other (non HMO) | Source: Ambulatory Visit | Attending: Neurology | Admitting: Neurology

## 2019-05-03 DIAGNOSIS — R270 Ataxia, unspecified: Secondary | ICD-10-CM

## 2019-05-03 DIAGNOSIS — R259 Unspecified abnormal involuntary movements: Secondary | ICD-10-CM

## 2019-05-03 DIAGNOSIS — G4719 Other hypersomnia: Secondary | ICD-10-CM

## 2019-05-03 DIAGNOSIS — R2689 Other abnormalities of gait and mobility: Secondary | ICD-10-CM

## 2019-05-03 NOTE — Progress Notes (Signed)
Pt neuro ordered and will follow pt on sleep study. Pt was also referred  For pt and speech as well. [place]

## 2019-05-03 NOTE — Progress Notes (Signed)
Pt has not had sleep study as of yet and will follow up 30 after with CPap read out. Laurie Mckinney

## 2019-05-03 NOTE — Telephone Encounter (Signed)
Patient states that she did see neurology yesterday and will be doing P.T. and speech therapy and that neurology will be handling her sleep study.  I told her to follow up 1 month after sleep study and her having CPAP machine with CPAP read out for a month.   Patient states she understands.

## 2019-05-05 ENCOUNTER — Encounter (HOSPITAL_COMMUNITY): Payer: Self-pay | Admitting: Physician Assistant

## 2019-05-05 ENCOUNTER — Other Ambulatory Visit: Payer: Self-pay

## 2019-05-05 ENCOUNTER — Telehealth: Payer: Self-pay | Admitting: Medical

## 2019-05-05 ENCOUNTER — Ambulatory Visit (HOSPITAL_COMMUNITY)
Admission: RE | Admit: 2019-05-05 | Discharge: 2019-05-05 | Disposition: A | Discharge status: Home / Self Care | Payer: Managed Care, Other (non HMO) | Source: Ambulatory Visit | Attending: Physician Assistant | Admitting: Physician Assistant

## 2019-05-05 VITALS — BP 128/72 | HR 72 | Ht 72.0 in | Wt 276.0 lb

## 2019-05-05 DIAGNOSIS — R4701 Aphasia: Secondary | ICD-10-CM | POA: Diagnosis not present

## 2019-05-05 DIAGNOSIS — J45909 Unspecified asthma, uncomplicated: Secondary | ICD-10-CM | POA: Diagnosis not present

## 2019-05-05 DIAGNOSIS — Z79899 Other long term (current) drug therapy: Secondary | ICD-10-CM | POA: Diagnosis not present

## 2019-05-05 DIAGNOSIS — I441 Atrioventricular block, second degree: Secondary | ICD-10-CM | POA: Diagnosis not present

## 2019-05-05 DIAGNOSIS — R0683 Snoring: Secondary | ICD-10-CM | POA: Insufficient documentation

## 2019-05-05 DIAGNOSIS — I4892 Unspecified atrial flutter: Secondary | ICD-10-CM | POA: Insufficient documentation

## 2019-05-05 DIAGNOSIS — E119 Type 2 diabetes mellitus without complications: Secondary | ICD-10-CM | POA: Diagnosis not present

## 2019-05-05 DIAGNOSIS — I10 Essential (primary) hypertension: Secondary | ICD-10-CM | POA: Diagnosis not present

## 2019-05-05 DIAGNOSIS — R42 Dizziness and giddiness: Secondary | ICD-10-CM | POA: Diagnosis not present

## 2019-05-05 DIAGNOSIS — I4819 Other persistent atrial fibrillation: Secondary | ICD-10-CM | POA: Insufficient documentation

## 2019-05-05 DIAGNOSIS — Z7901 Long term (current) use of anticoagulants: Secondary | ICD-10-CM | POA: Diagnosis not present

## 2019-05-05 NOTE — Progress Notes (Signed)
Primary Care Physician: Denita Lung, MD Primary Cardiologist: Dr Almira Coaster [facility]) Primary Electrophysiologist: none Referring Physician: Chana Bode PA-C   Laurie Mckinney is a 58 y.o. female with a history of DM, HTN, second degree AV block type I, atrial flutter and atrial fibrillation who presents for consultation in the [facility].  The patient was initially diagnosed with atrial fibrillation on ECG at her PCP office after presenting with sudden onset of gait instability, difficulty finding words, falls, and intermittent confusion three weeks ago. Husband reports that she was in her usual state of health just prior to her present symptoms. She does have a history of atrial flutter in 2016 post operatively. Workup at ER negative for acute stroke. Seen by neurology and symptoms appear to have a large emotional component.   On follow up today, patient reports that he gait is more steady but otherwise she feels the same with generalized fatigue. She was seen at the ER on 04/30/19 for symptoms of dizziness and she was prescribed melizine. She was in rate controlled afib at the time. She remains in rate controlled afib today. She denies any missed doses of anticoagulation.   Today, she denies symptoms of palpitations, chest pain, shortness of breath, orthopnea, PND, lower extremity edema, presyncope, syncope, snoring, daytime somnolence, bleeding. The patient is tolerating medications without difficulties and is otherwise without complaint today. +fatigue   Atrial Fibrillation Risk Factors:  She does have symptoms of sleep apnea. Sleep study pending. she does not have a history of alcohol use.  she has a BMI of Body mass index is 37.43 kg/m.Marland Kitchen Filed Weights   05/05/19 1059  Weight: 125.2 kg    Family History  Problem Relation Age of Onset  . Arthritis Mother   . Heart disease Father      Atrial Fibrillation Management history:   Previous antiarrhythmic drugs: none Previous cardioversions: none Previous ablations: none CHADS2VASC score: 3 (DM, HTN, female) Anticoagulation history: Eliquis   Past Medical History:  Diagnosis Date  . Adjustment insomnia    SHIFT WORK  . Allergy    RHINITIS  . Asthma   . Atrial flutter [facility]) 02/26/15   [facility] [place]  . Bursitis    right hip  . Diabetes mellitus without complication ([place])   . Hypertension   . Migraine headache   . Mobitz type 2 second degree heart block   . Obesity    Past Surgical History:  Procedure Laterality Date  . ABDOMINAL HYSTERECTOMY    . COLONOSCOPY  2011   Dr.Brodie  . ROBOTIC ASSISTED TOTAL HYSTERECTOMY WITH BILATERAL SALPINGO OOPHERECTOMY Bilateral 02/05/2015   Procedure: ROBOTIC ASSISTED TOTAL LAPAROSCOPIC HYSTERECTOMY WITH BILATERAL SALPINGO OOPHORECTOMY;  Surgeon: Everitt Amber, MD;  Location: [facility];  Service: Gynecology;  Laterality: Bilateral;    Current Outpatient Medications  Medication Sig Dispense Refill  . acetaminophen (TYLENOL ARTHRITIS PAIN) 650 MG CR tablet Take 650 mg by mouth every 8 (eight) hours as needed for pain.    Marland Kitchen albuterol (PROVENTIL HFA;VENTOLIN HFA) 108 (90 Base) MCG/ACT inhaler TAKE 2 PUFFS BY MOUTH EVERY 6 HOURS AS NEEDED FOR WHEEZE OR SHORTNESS OF BREATH (Patient taking differently: Inhale 2 puffs into the lungs every 4 (four) hours as needed for wheezing. ) 6.7 Inhaler 0  . apixaban (ELIQUIS) 5 MG TABS tablet Take 1 tablet (5 mg total) by mouth 2 (two) times daily. 60 tablet 2  . buPROPion (WELLBUTRIN SR) 100 MG 12 hr tablet  Take 1 tablet (100 mg total) by mouth daily. 90 tablet 3  . lisinopril-hydrochlorothiazide (ZESTORETIC) 10-12.5 MG tablet Take 1 tablet by mouth daily. 30 tablet 0  . meclizine (ANTIVERT) 25 MG tablet Take 1 tablet (25 mg total) by mouth 3 (three) times daily as needed for dizziness. 20 tablet 1  . Multiple Vitamins-Minerals (MULTIVITAMIN WITH MINERALS) tablet Take 1 tablet by  mouth every morning.     Marland Kitchen oxybutynin (DITROPAN XL) 10 MG 24 hr tablet Take 1 tablet (10 mg total) by mouth at bedtime. 90 tablet 3  . PARoxetine (PAXIL) 20 MG tablet Take 1 tablet (20 mg total) by mouth daily. 90 tablet 3   No current facility-administered medications for this encounter.     No Known Allergies  Social History   Socioeconomic History  . Marital status: Married    Spouse name: Not on file  . Number of children: Not on file  . Years of education: Not on file  . Highest education level: Not on file  Occupational History  . Not on file  Social Needs  . Financial resource strain: Not on file  . Food insecurity    Worry: Not on file    Inability: Not on file  . Transportation needs    Medical: Not on file    Non-medical: Not on file  Tobacco Use  . Smoking status: Never Smoker  . Smokeless tobacco: Never Used  Substance and Sexual Activity  . Alcohol use: No  . Drug use: No  . Sexual activity: Yes  Lifestyle  . Physical activity    Days per week: Not on file    Minutes per session: Not on file  . Stress: Not on file  Relationships  . Social Herbalist on phone: Not on file    Gets together: Not on file    Attends religious service: Not on file    Active member of club or organization: Not on file    Attends meetings of clubs or organizations: Not on file    Relationship status: Not on file  . Intimate partner violence    Fear of current or ex partner: Not on file    Emotionally abused: Not on file    Physically abused: Not on file    Forced sexual activity: Not on file  Other Topics Concern  . Not on file  Social History Narrative  . Not on file     ROS- All systems are reviewed and negative except as per the HPI above.  Physical Exam: Vitals:   05/05/19 1059  BP: 128/72  Pulse: 72  Weight: 125.2 kg  Height: 6' (1.829 m)    GEN- The patient is well appearing obese female, alert and oriented x 3 today.   HEENT-head  normocephalic, atraumatic, sclera clear, conjunctiva pink, hearing intact, trachea midline. Lungs- Clear to ausculation bilaterally, normal work of breathing Heart- irregular rate and rhythm, no murmurs, rubs or gallops  GI- soft, NT, ND, + BS Extremities- no clubbing, cyanosis, or edema MS- no significant deformity or atrophy Skin- no rash or lesion Neuro- strength and sensation are intact   Wt Readings from Last 3 Encounters:  05/05/19 125.2 kg  05/02/19 124.7 kg  04/21/19 126.7 kg    EKG today demonstrates afib HR 72, QRS 80, QTc 424  Echo 02/26/15 demonstrated  EF 60-65%, mild LAE, no valvular issues.  Epic records are reviewed at length today  Assessment and Plan:  1. Persistent Atrial  fibrillation/atrial flutter Patient in persistent rate controlled afib. Avoid AV nodal agents given baseline 2nd degree AV block. We discussed therapeutic options including DCCV and AADs. Patient declines DCCV at this time and would like to pursue sleep study first. Discussed that the longer she is in afib the more difficult it could be to restore SR. Patient voices understanding.  Would not add further rate control.  Dofetilide would be AAD of choice in the future if needed. Continue Eliquis 5 mg BID  This patients CHA2DS2-VASc Score and unadjusted Ischemic Stroke Rate (% per year) is equal to 3.2 % stroke rate/year from a score of 3  Above score calculated as 1 point each if present [CHF, HTN, DM, Vascular=MI/PAD/Aortic Plaque, Age if 65-74, or Female] Above score calculated as 2 points each if present [Age > 75, or Stroke/TIA/TE]  2. Dizziness/aphasia Workup negative at ER for acute stroke.  Seen by neurology and felt that there is a large emotional component. Plans per neuro, MRI c-spine, physical therapy, and speech therapy.   3. Second degree heart block type I First noted in 2016, has been asymptomatic. Avoid AV nodal agents.  4. HTN Stable, no changes today.  5.  Snoring/Witness apnea Sleep study pending per PCP notes.   Follow up in AF clinic in 6 weeks.   [facility] [address] [place], [place] [address] [phone] 05/05/2019 11:37 AM

## 2019-05-05 NOTE — Telephone Encounter (Signed)
As part of our recent eval, make sure we have also got order in for sleep study through [facility] or [place]

## 2019-05-08 NOTE — Telephone Encounter (Signed)
Yes they have called and asked her call them to schedule appointment.

## 2019-05-08 NOTE — Telephone Encounter (Signed)
I received something from [facility] Monday showing they have called and can't seem to get in touch with her

## 2019-05-09 NOTE — Telephone Encounter (Signed)
That means that she has not called them back to schedule her appointment.

## 2019-05-09 NOTE — Telephone Encounter (Signed)
Novella Rob: [id number] (exp. 05/03/19 to 10/30/19) patient had MRI at GI on 05/03/19.

## 2019-05-10 ENCOUNTER — Telehealth: Payer: Self-pay | Admitting: Medical

## 2019-05-10 NOTE — Telephone Encounter (Signed)
See my other note

## 2019-05-10 NOTE — Telephone Encounter (Signed)
I received another [place] from regarding disability.  This looks like the exact same form I did already.  I looked under media for scanned copy of the prior form. I don't see the previous one I did.   Please find the prior one I did so we can re-submit.  There is no reason to re-write all that stuff as it takes 15+ minutes I don't have.  Also, at this point, if they need a "progress report" or update, then neurology can do that as she has seen them now. They should weigh in on her status at this point.

## 2019-05-10 NOTE — Telephone Encounter (Signed)
Pt left voicemail to see if you have completed paperwork for [employer] and faxed it back to them

## 2019-05-10 NOTE — Telephone Encounter (Signed)
Short Term disability form received. Sending back to be completed. Please return to [facility] as they also need records.

## 2019-05-10 NOTE — Telephone Encounter (Signed)
See Kathy's note.   Given to [employer] [facility] in the purple folder.

## 2019-05-11 ENCOUNTER — Encounter (HOSPITAL_COMMUNITY): Payer: Self-pay | Admitting: Physical Therapy

## 2019-05-11 ENCOUNTER — Ambulatory Visit (HOSPITAL_COMMUNITY): Payer: Managed Care, Other (non HMO) | Attending: Neurology | Admitting: Speech Pathology

## 2019-05-11 ENCOUNTER — Encounter (HOSPITAL_COMMUNITY): Payer: Self-pay | Admitting: Speech Pathology

## 2019-05-11 ENCOUNTER — Other Ambulatory Visit: Payer: Self-pay

## 2019-05-11 ENCOUNTER — Ambulatory Visit (HOSPITAL_COMMUNITY): Payer: Managed Care, Other (non HMO) | Admitting: Physical Therapy

## 2019-05-11 DIAGNOSIS — R42 Dizziness and giddiness: Secondary | ICD-10-CM

## 2019-05-11 DIAGNOSIS — R296 Repeated falls: Secondary | ICD-10-CM

## 2019-05-11 DIAGNOSIS — R41841 Cognitive communication deficit: Secondary | ICD-10-CM | POA: Insufficient documentation

## 2019-05-11 DIAGNOSIS — R29818 Other symptoms and signs involving the nervous system: Secondary | ICD-10-CM | POA: Diagnosis present

## 2019-05-11 DIAGNOSIS — R2681 Unsteadiness on feet: Secondary | ICD-10-CM | POA: Diagnosis present

## 2019-05-11 NOTE — Patient Instructions (Signed)
   HEEL BRIDGING  While lying on your back, tighten your lower abdominals, squeeze your buttocks, lift your toes and then raise your buttocks off the floor/bed as creating a "Bridge" with your body. You should be pressing through your heels the entire time.   Hold for 3 seconds, then relax.  Repeat 10 times, 2-3 times per day.    Narrow Base of Support, Ground (at [employer] and with spotter for safety)  Stand with arms crossed and bring feet together so toes and heels are touching. Face forward and maintain position.  Try to hold for 10-15 seconds, then relax.  Repeat 3 times, 2-3 times per day.    Partial Tandem Stance (at kitchen counter and with spotter for safety)  Stand with one foot in front of the other and off to the side.  Stand in front of a solid object for support.  Try to hold for 10-15 seconds, then switch feet.  Repeat 3 times, 2-3 times per day.

## 2019-05-11 NOTE — Therapy (Signed)
[place], [name], [address] Phone: [phone]   Fax:  [phone]  Speech Language Pathology Evaluation  Patient Details  Name: Laurie Mckinney MRN: [id number] Date of Birth: 1960-12-28 Referring Provider (SLP): Sarina Ill, MD   Encounter Date: 05/11/2019  End of Session - 05/11/19 1312    Visit Number  1    Number of Visits  1    Authorization Type  [employer] Managed care    SLP Start Time  1010    SLP Stop Time   1100    SLP Time Calculation (min)  50 min    Activity Tolerance  Patient tolerated treatment well       Past Medical History:  Diagnosis Date  . Adjustment insomnia    SHIFT WORK  . Allergy    RHINITIS  . Asthma   . Atrial flutter [facility]) 02/26/15   [facility] [place]  . Bursitis    right hip  . Diabetes mellitus without complication ([place])   . Hypertension   . Migraine headache   . Mobitz type 2 second degree heart block   . Obesity     Past Surgical History:  Procedure Laterality Date  . ABDOMINAL HYSTERECTOMY    . COLONOSCOPY  2011   Dr.Brodie  . ROBOTIC ASSISTED TOTAL HYSTERECTOMY WITH BILATERAL SALPINGO OOPHERECTOMY Bilateral 02/05/2015   Procedure: ROBOTIC ASSISTED TOTAL LAPAROSCOPIC HYSTERECTOMY WITH BILATERAL SALPINGO OOPHORECTOMY;  Surgeon: Everitt Amber, MD;  Location: [facility];  Service: Gynecology;  Laterality: Bilateral;    There were no vitals filed for this visit.  Subjective Assessment - 05/11/19 1022    Subjective  "It started at the end of June."    Special Tests  MoCA    Currently in Pain?  No/denies         SLP Evaluation [facility] - 05/11/19 1022      SLP Visit Information   SLP Received On  05/11/19    Referring Provider (SLP)  Sarina Ill, MD    Onset Date  04/05/2019    Medical Diagnosis  aphasia      General Information   HPI  Pt    Behavioral/Cognition  alert and cooperative    Mobility Status  ambulatory      Balance Screen   Has the  patient fallen in the past 6 months  Yes    How many times?  5    Has the patient had a decrease in activity level because of a fear of falling?   Yes    Is the patient reluctant to leave their home because of a fear of falling?   No      Prior Functional Status   Cognitive/Linguistic Baseline  Within functional limits    Type of Home  House     Lives With  Spouse    Available Support  Family    Education  some college    Vocation  Other (Comment)   27.5 hours a week for [employer]     Cognition   Overall Cognitive Status  Impaired/Different from baseline    Area of Impairment  Following commands    Following Commands  Follows multi-step commands with increased time   with repetition   Memory  Appears intact    Awareness  Appears intact    Problem Solving  Appears intact    Executive Function  --   She reports difficulty with multi-tasking     Auditory Comprehension  Overall Auditory Comprehension  Appears within functional limits for tasks assessed    Yes/No Questions  Within Functional Limits    Commands  Within Functional Limits    Conversation  Complex    EffectiveTechniques  Repetition      Visual Recognition/Discrimination   Discrimination  Within Function Limits      Reading Comprehension   Reading Status  Within funtional limits      Expression   Primary Mode of Expression  Verbal      Verbal Expression   Overall Verbal Expression  Appears within functional limits for tasks assessed    Initiation  No impairment    Automatic Speech  Name;Social Response;Day of week    Level of Generative/Spontaneous Verbalization  Conversation    Repetition  No impairment    Naming  No impairment    Pragmatics  No impairment    Non-Verbal Means of Communication  Not applicable      Written Expression   Dominant Hand  Right    Written Expression  --   ?ataxia     Oral Motor/Sensory Function   Overall Oral Motor/Sensory Function  Appears within functional limits for tasks  assessed    Labial ROM  Within Functional Limits    Labial Symmetry  Within Functional Limits    Labial Strength  Within Functional Limits    Labial Sensation  Within Functional Limits    Labial Coordination  WFL    Lingual ROM  Within Functional Limits    Lingual Symmetry  Within Functional Limits    Lingual Strength  Within Functional Limits    Lingual Sensation  Within Functional Limits    Lingual Coordination  WFL    Facial ROM  Within Functional Limits    Facial Symmetry  Within Functional Limits    Facial Strength  Within Functional Limits    Facial Sensation  Within Functional Limits    Facial Coordination  WFL    Velum  Within Functional Limits    Mandible  Within Functional Limits      Motor Speech   Overall Motor Speech  Appears within functional limits for tasks assessed   slight deficit noted in diadokokinetics   Respiration  Within functional limits    Phonation  Normal    Resonance  Within functional limits    Articulation  Within functional limitis    Intelligibility  Intelligible    Motor Planning  Witnin functional limits    Motor Speech Errors  Not applicable    Phonation  WFL      Standardized Assessments   Standardized Assessments   Montreal Cognitive Assessment (MOCA)    Montreal Cognitive Assessment ([place])   28/30        SLP Education - 05/11/19 1312    Education Details  Suggest sleep study and OT eval;    Person(s) Educated  Patient    Methods  Explanation    Comprehension  Verbalized understanding       SLP Short Term Goals - 05/11/19 1313      SLP SHORT TERM GOAL #1   Title  N/A eval only         Plan - 05/11/19 1313    Clinical Impression Statement Pt presents with c/o slow thinking, dropping items, and decreased balance. She endorses disrupted sleep routine and wonders if this has impacted her thinking. She scored a 28/30 on the MoCA with a normal score of 26 and above. She was able to recall 4/5  items after a 10 minute delay. She  had an error on visual construction task, however she reports difficulty writing and dropping items at times in both hands. Pt without evidence of aphasia and was able to answer complex questions with good accuracy (content and fluency). Pt without cranial nerve deficits and no dysarthria appreciated. She did have subtle errors during diadochokinetic tasks (repetition of "puh" "tuh" "kuh" in singe phoneme repetition task). She endorses xerostomia and sips water frequently. Pt is reportedly going to have a sleep study, which may help explain her fluctuation in cognitive skills. Pt primarily is concerned about her decreased balance and has had several falls. She may benefit from an OT consult due to Pt reports of reduced hand eye coordination and dropping items in both hands. She does report some difficulty with multi-tasking and has to organize her tasks in order to complete. No further SLP services indicated at this time, however Pt may wish to pursue following results of sleep study if she continues to note cognitive dysfunction. She may also benefit from a neuropsychological evaluation to help identify areas of deficits. Pt is in agreement with plan of care.    Consulted and Agree with Plan of Care  Patient       Patient will benefit from skilled therapeutic intervention in order to improve the following deficits and impairments:   1. Cognitive communication deficit       Problem List Patient Active Problem List   Diagnosis Date Noted  . Snoring 04/21/2019  . Varicose veins of lower extremity 04/14/2019  . Cough 04/14/2019  . Atrial fibrillation ([place]) 04/14/2019  . DOE (dyspnea on exertion) 04/14/2019  . Disequilibrium 04/13/2019  . Ataxia 04/13/2019  . Hearing loss 04/13/2019  . Aphasia 04/13/2019  . Type 2 diabetes mellitus without complication, without long-term current use of insulin ([place]) 09/29/2018  . Second degree heart block 05/05/2018  . Urinary incontinence 05/05/2018  . Chronic  low back pain without sciatica 05/05/2018  . Hypertension associated with diabetes ([place]) 09/20/2017  . Actinic keratosis 02/24/2017  . Dysthymia 01/10/2016  . History of endometrial cancer 02/05/2015    Class: Stage 1  . History of migraine headaches 03/18/2011  . Allergic rhinitis due to pollen 03/18/2011  . Obesity (BMI 30-39.9) 03/18/2011   Thank you,  Genene Churn, [place]  [place] 05/11/2019, 1:15 PM  [place], [name], [address] Phone: [phone]   Fax:  [phone]  Name: SARAANN ENNEKING MRN: [id number] Date of Birth: 1961/09/04

## 2019-05-11 NOTE — Therapy (Signed)
[place], [name], [address] Phone: [phone]   Fax:  [phone]  Physical Therapy Evaluation  Patient Details  Name: Laurie Mckinney MRN: [id number] Date of Birth: Apr 22, 1961 Referring Provider (PT): Sarina Ill    Encounter Date: 05/11/2019  PT End of Session - 05/11/19 1220    Visit Number  1    Number of Visits  17    Date for PT Re-Evaluation  07/06/19   mini-reassess 06/08/19   Authorization Type  [employer] Managed (60 visits)    Authorization Time Period  05/11/19 to 07/06/19    Authorization - Visit Number  1    Authorization - Number of Visits  10    PT Start Time  1115    PT Stop Time  1200    PT Time Calculation (min)  45 min    Activity Tolerance  Patient tolerated treatment well    Behavior During Therapy  [facility] for tasks assessed/performed       Past Medical History:  Diagnosis Date  . Adjustment insomnia    SHIFT WORK  . Allergy    RHINITIS  . Asthma   . Atrial flutter [facility]) 02/26/15   [facility]  . Bursitis    right hip  . Diabetes mellitus without complication ([place])   . Hypertension   . Migraine headache   . Mobitz type 2 second degree heart block   . Obesity     Past Surgical History:  Procedure Laterality Date  . ABDOMINAL HYSTERECTOMY    . COLONOSCOPY  2011   Dr.Brodie  . ROBOTIC ASSISTED TOTAL HYSTERECTOMY WITH BILATERAL SALPINGO OOPHERECTOMY Bilateral 02/05/2015   Procedure: ROBOTIC ASSISTED TOTAL LAPAROSCOPIC HYSTERECTOMY WITH BILATERAL SALPINGO OOPHORECTOMY;  Surgeon: Everitt Amber, MD;  Location: [facility];  Service: Gynecology;  Laterality: Bilateral;    There were no vitals filed for this visit.   Subjective Assessment - 05/11/19 1116    Subjective  I've been looking for answers since the beginning of July. I've kind of been diagnosed with A-fib and am going to have a sleep study and all sorts of other appoinments in between. I'm doing better walking. Have  been having issues with holding things and walking, I have had some falls too. I've been having trouble multitasking. Plenty of dizziness, some visual issues sometimes as its like I can't stay tuned in to anything long enough.    Patient Stated Goals  improve coordination, be able to raise up from commode    Currently in Pain?  No/denies         [facility] PT Assessment - 05/11/19 0001      Assessment   Medical Diagnosis  ataxia     Referring Provider (PT)  Sarina Ill     Onset Date/Surgical Date  --   July 2020    Next MD Visit  unsure     Prior Therapy  years ago for my anlke       Precautions   Precautions  Fall      Balance Screen   Has the patient fallen in the past 6 months  Yes    How many times?  since saturday, 5     Has the patient had a decrease in activity level because of a fear of falling?   Yes    Is the patient reluctant to leave their home because of a fear of falling?   No      Home  Film/video [employer] residence      Prior Function   Level of Independence  Independent;Independent with basic ADLs;Independent with gait;Independent with transfers    Vocation  On disability    Vocation Requirements  short term disability     Leisure  cooking, facebook, reading, gardening       Cognition   Overall Cognitive Status  --   MOCA done by SLP 28/30     ROM / Strength   AROM / PROM / Strength  Strength      Strength   Strength Assessment Site  Hip;Knee;Ankle    Right/Left Hip  Right;Left    Right Hip Flexion  5/5    Right Hip Extension  3/5    Right Hip ABduction  5/5    Left Hip Flexion  5/5    Left Hip Extension  3/5    Left Hip ABduction  5/5    Right/Left Knee  Right;Left    Right Knee Flexion  4/5    Right Knee Extension  5/5    Left Knee Flexion  4/5    Left Knee Extension  5/5    Right/Left Ankle  Right;Left    Right Ankle Dorsiflexion  5/5    Left Ankle Dorsiflexion  5/5      Standardized Balance Assessment   Standardized  Balance Assessment  Dynamic Gait Index      Dynamic Gait Index   Level Surface  Mild Impairment    Change in Gait Speed  Mild Impairment    Gait with Horizontal Head Turns  Moderate Impairment    Gait with Vertical Head Turns  Moderate Impairment    Gait and Pivot Turn  Moderate Impairment    Step Over Obstacle  Moderate Impairment    Step Around Obstacles  Moderate Impairment    Steps  Moderate Impairment    Total Score  10                Objective measurements completed on examination: See above findings.      [facility] Adult PT Treatment/Exercise - 05/11/19 0001      Exercises   Exercises  Knee/Hip      Knee/Hip Exercises: Supine   Bridges  Both;1 set;10 reps          Balance Exercises - 05/11/19 1218      Balance Exercises: Standing   Standing Eyes Opened  Narrow base of support (BOS);3 reps;10 secs    Partial Tandem Stance  Eyes open;3 reps;15 secs        PT Education - 05/11/19 1219    Education Details  HEP, importance of checking orthostatics especially since vestibular tests appear negative, POC moving forward, exercise form and purpose    Person(s) Educated  Patient    Methods  Explanation;Handout;Demonstration;Verbal cues    Comprehension  Verbalized understanding;Returned demonstration;Need further instruction       PT Short Term Goals - 05/11/19 1226      PT SHORT TERM GOAL #1   Title  Patient to be compliant with correct performance of HEP, to be updated PRN    Time  4    Period  Weeks    Status  New    Target Date  06/08/19      PT SHORT TERM GOAL #2   Title  Patient to show glute and hamstring strength of at least 4+/5 in order to assist with functional tasks such as getting up from low seats  and low commode    Time  4    Period  Weeks    Status  New      PT SHORT TERM GOAL #3   Title  Patient to score at least 14/24 on DGI in order to show improved mobilty and reduced fall risk    Time  4    Period  Weeks    Status  New       PT SHORT TERM GOAL #4   Title  Patient to show improved gait pattern with reduction of wide based stance in order to show improved mobility and balance    Time  4    Period  Weeks    Status  New        PT Long Term Goals - 05/11/19 1229      PT LONG TERM GOAL #1   Title  Patient to score at least 18/24 on DGI in order to show reduced fall risk and improved functional balance skills    Time  8    Period  Weeks    Status  New    Target Date  07/06/19      PT LONG TERM GOAL #2   Title  Patient to report no falls within the past 4 weeks in order to show improved functional balance skills and reduced risk of fall-related injury    Time  8    Period  Weeks    Status  New      PT LONG TERM GOAL #3   Title  Patient to report resolution of dizziness and vertigo in order to improve QOL and assist in fall prevention    Time  8    Period  Weeks    Status  New      PT LONG TERM GOAL #4   Title  Patient to be able to state at least 3 concepts of energy conservation in order to show improved self-regulation skills and prevent patient from over-fatiguing throughout the day    Time  8    Period  Weeks    Status  New             Plan - 05/11/19 1222    Clinical Impression Statement  Laurie Mckinney arrives with complaints of symptom onset, including difficulty holding onto items, falls, and dizziness, which started in July 2020; she reports she has been diagnosed with A-fib and may have a cardioversion, however her doctors are not sure what is causing her other symptoms and are working on ruling diagnoses out. Examination reveals hamstring and glute weakness (MMT otherwise 5/5), significant balance impairment as noted by DGI, mild ataxia primarily in L UE and to lesser extent in L LE, poor functional activity tolerance. Attempted orthostatic measurements however had technical error with automatic BP cuff, unable to further assess due to time limitations of session. Did investigate symptoms of  vertigo however patient negative on both tests for central vestibular impairment and BPPV, although may plan on re-testing next session. She will benefit from skilled PT services to address functional deficits, reduce fall risk, and assist in return to optimal level of function moving forward.    Personal Factors and Comorbidities  Fitness;Comorbidity 2;Social Background;Education;Time since onset of injury/illness/exacerbation    Comorbidities  obesity, A-fib    Examination-Activity Limitations  Transfers;Locomotion Level;Bed Mobility;Reach Overhead;Squat;Stairs;Stand    Examination-Participation Restrictions  Church;Yard Work;Cleaning;Community Activity;Interpersonal Relationship;Volunteer    Stability/Clinical Decision Making  Unstable/Unpredictable    Clinical Decision Making  High    Rehab Potential  Good    PT Frequency  2x / week    PT Duration  8 weeks    PT Treatment/Interventions  ADLs/Self Care Home Management;Canalith Repostioning;DME Instruction;Gait training;Stair training;Functional mobility training;Therapeutic activities;Therapeutic exercise;Balance training;Patient/family education;Neuromuscular re-education;Manual techniques;Vestibular;Taping;Energy conservation    PT Next Visit Plan  need to do orthostatic check with large adult manual cuff; she also needs to start taking a diary of when dizzines starts/stops. Otherwise balance/coordination focus, hip extensor and hamstirng strength. consider body weight support TM.    PT Home Exercise Plan  Eval: bridges, narrow BOS, partial tandem stance    Consulted and Agree with Plan of Care  Patient       Patient will benefit from skilled therapeutic intervention in order to improve the following deficits and impairments:  Abnormal gait, Decreased coordination, Difficulty walking, Decreased safety awareness, Dizziness, Obesity, Decreased activity tolerance, Impaired vision/preception, Decreased balance, Decreased mobility, Decreased  strength  Visit Diagnosis: 1. Other symptoms and signs involving the nervous system   2. Unsteadiness on feet   3. Repeated falls   4. Dizziness and giddiness        Problem List Patient Active Problem List   Diagnosis Date Noted  . Snoring 04/21/2019  . Varicose veins of lower extremity 04/14/2019  . Cough 04/14/2019  . Atrial fibrillation ([place]) 04/14/2019  . DOE (dyspnea on exertion) 04/14/2019  . Disequilibrium 04/13/2019  . Ataxia 04/13/2019  . Hearing loss 04/13/2019  . Aphasia 04/13/2019  . Type 2 diabetes mellitus without complication, without long-term current use of insulin ([place]) 09/29/2018  . Second degree heart block 05/05/2018  . Urinary incontinence 05/05/2018  . Chronic low back pain without sciatica 05/05/2018  . Hypertension associated with diabetes ([place]) 09/20/2017  . Actinic keratosis 02/24/2017  . Dysthymia 01/10/2016  . History of endometrial cancer 02/05/2015    Class: Stage 1  . History of migraine headaches 03/18/2011  . Allergic rhinitis due to pollen 03/18/2011  . Obesity (BMI 30-39.9) 03/18/2011    Deniece Ree PT, DPT, CBIS  Supplemental Physical Therapist [place]    Pager [phone] Acute Rehab Office [place], [name], [address] Phone: [phone]   Fax:  [phone]  Name: Laurie Mckinney MRN: [id number] Date of Birth: [birth year]

## 2019-05-15 ENCOUNTER — Ambulatory Visit (INDEPENDENT_AMBULATORY_CARE_PROVIDER_SITE_OTHER): Payer: Managed Care, Other (non HMO) | Admitting: Cardiology

## 2019-05-15 ENCOUNTER — Other Ambulatory Visit: Payer: Self-pay

## 2019-05-15 ENCOUNTER — Encounter: Payer: Self-pay | Admitting: Cardiology

## 2019-05-15 ENCOUNTER — Telehealth: Payer: Self-pay | Admitting: Cardiology

## 2019-05-15 ENCOUNTER — Encounter: Payer: Self-pay | Admitting: *Deleted

## 2019-05-15 VITALS — BP 138/84 | HR 79 | Ht 72.0 in | Wt 277.4 lb

## 2019-05-15 DIAGNOSIS — R5383 Other fatigue: Secondary | ICD-10-CM | POA: Diagnosis not present

## 2019-05-15 DIAGNOSIS — I4819 Other persistent atrial fibrillation: Secondary | ICD-10-CM | POA: Diagnosis not present

## 2019-05-15 DIAGNOSIS — R42 Dizziness and giddiness: Secondary | ICD-10-CM

## 2019-05-15 DIAGNOSIS — R0602 Shortness of breath: Secondary | ICD-10-CM | POA: Diagnosis not present

## 2019-05-15 NOTE — Telephone Encounter (Signed)
°  Precert needed for: ECHO   Location: [facility]    Date: Sept 2, 2020

## 2019-05-15 NOTE — Progress Notes (Signed)
Clinical Summary Laurie Mckinney is a 58 y.o.female seen today for follow up of the following medical problems.   1. AFib/Aflutter - previously seen I afib clinic - history of aflutter in 2016 postop - relatively new diagnosis of afib earlier this year by pcp - avoiding av nodal agents due to history of 2nd degree AV block type I - from afib clinic would consider dofetilide if neccesary - has been on eliquis.   - Laurie Mckinney declined DCCV at last afib clinic visit.  - sporadic palpitations, short in duration. Dizziness and SOB unclear if afib related.    2. SOB/DOE - dyspnea just walking in from parking lot - significantly worsened over the summer - mild swelling at times when sitting long times.  - some orthopnea.    3. Chest pain - sharp pain midchest to epigastric, 5-10/10 in severity. Ongoing for few years - can occur at any time. Not positional. Somewhat better with antacids. - no other associated symptoms - reports remote stress test in the past.   01/2015 GXT no ischemic changes.      4. Dizziness - difficulty with gait. Feels lightheaded,  - mainly occurs when on her feet.  - can have some SOB associated - reports multiple falls. Leg weakness and dizziness. Coordination is off.     5. Aphasia/Fatigue - followed by neurology - working with PT - thought to be a large emotional/stress related component from neuro notes - awaiting sleep study  Past Medical History:  Diagnosis Date   Adjustment insomnia    SHIFT WORK   Allergy    RHINITIS   Asthma    Atrial flutter ([place]) 02/26/15   [facility]   Bursitis    right hip   Diabetes mellitus without complication ([facility])    Hypertension    Migraine headache    Mobitz type 2 second degree heart block    Obesity      No Known Allergies   Current Outpatient Medications  Medication Sig Dispense Refill   acetaminophen (TYLENOL ARTHRITIS PAIN) 650 MG CR tablet Take 650 mg by mouth  every 8 (eight) hours as needed for pain.     albuterol (PROVENTIL HFA;VENTOLIN HFA) 108 (90 Base) MCG/ACT inhaler TAKE 2 PUFFS BY MOUTH EVERY 6 HOURS AS NEEDED FOR WHEEZE OR SHORTNESS OF BREATH (Patient taking differently: Inhale 2 puffs into the lungs every 4 (four) hours as needed for wheezing. ) 6.7 Inhaler 0   apixaban (ELIQUIS) 5 MG TABS tablet Take 1 tablet (5 mg total) by mouth 2 (two) times daily. 60 tablet 2   buPROPion (WELLBUTRIN SR) 100 MG 12 hr tablet Take 1 tablet (100 mg total) by mouth daily. 90 tablet 3   lisinopril-hydrochlorothiazide (ZESTORETIC) 10-12.5 MG tablet Take 1 tablet by mouth daily. 30 tablet 0   meclizine (ANTIVERT) 25 MG tablet Take 1 tablet (25 mg total) by mouth 3 (three) times daily as needed for dizziness. 20 tablet 1   Multiple Vitamins-Minerals (MULTIVITAMIN WITH MINERALS) tablet Take 1 tablet by mouth every morning.      oxybutynin (DITROPAN XL) 10 MG 24 hr tablet Take 1 tablet (10 mg total) by mouth at bedtime. 90 tablet 3   PARoxetine (PAXIL) 20 MG tablet Take 1 tablet (20 mg total) by mouth daily. 90 tablet 3   No current facility-administered medications for this visit.      Past Surgical History:  Procedure Laterality Date   ABDOMINAL HYSTERECTOMY  COLONOSCOPY  2011   Dr.Brodie   ROBOTIC ASSISTED TOTAL HYSTERECTOMY WITH BILATERAL SALPINGO OOPHERECTOMY Bilateral 02/05/2015   Procedure: ROBOTIC ASSISTED TOTAL LAPAROSCOPIC HYSTERECTOMY WITH BILATERAL SALPINGO OOPHORECTOMY;  Surgeon: Everitt Amber, MD;  Location: [facility];  Service: Gynecology;  Laterality: Bilateral;     No Known Allergies    Family History  Problem Relation Age of Onset   Arthritis Mother    Heart disease Father      Social History Laurie Mckinney reports that Laurie Mckinney has never smoked. Laurie Mckinney has never used smokeless tobacco. Laurie Mckinney reports no history of alcohol use.   Review of Systems CONSTITUTIONAL: No weight loss, fever, chills, weakness or fatigue.  HEENT:  Eyes: No visual loss, blurred vision, double vision or yellow sclerae.No hearing loss, sneezing, congestion, runny nose or sore throat.  SKIN: No rash or itching.  CARDIOVASCULAR: per hpi RESPIRATORY: No shortness of breath, cough or sputum.  GASTROINTESTINAL: No anorexia, nausea, vomiting or diarrhea. No abdominal pain or blood.  GENITOURINARY: No burning on urination, no polyuria NEUROLOGICAL: per hpi MUSCULOSKELETAL: No muscle, back pain, joint pain or stiffness.  LYMPHATICS: No enlarged nodes. No history of splenectomy.  PSYCHIATRIC: No history of depression or anxiety.  ENDOCRINOLOGIC: No reports of sweating, cold or heat intolerance. No polyuria or polydipsia.  Marland Kitchen   Physical Examination Today's Vitals   05/15/19 1340  BP: 138/84  Pulse: 79  SpO2: 96%  Weight: 277 lb 6.4 oz (125.8 kg)  Height: 6' (1.829 m)   Body mass index is 37.62 kg/m.  Gen: resting comfortably, no acute distress HEENT: no scleral icterus, pupils equal round and reactive, no palptable cervical adenopathy,  CV: irreg, no mr/g, no jvd Resp: Clear to auscultation bilaterally GI: abdomen is soft, non-tender, non-distended, normal bowel sounds, no hepatosplenomegaly MSK: extremities are warm, no edema.  Skin: warm, no rash Neuro:  no focal deficits Psych: appropriate affect   -orthostatics negative in clinic  Assessment and Plan  1. Afib - unclear if playing any role in her constellation of SOB, fatigue, dizziness. Would not explain some of her neuro symptoms like "slow thoughts" and "feelign loopy" - we will try a cardioversion, will need to pay close attention to her symptoms once back in SR to decipher if related to afib or not, would help guide if would need to consider long term rhythm strategy if Laurie Mckinney reverts back to afib. Currently Laurie Mckinney is rate controlled afib, may be a patient that just does not tolerate afib despite rate control   -       Arnoldo Lenis, M.D

## 2019-05-15 NOTE — Patient Instructions (Addendum)
Your physician recommends that you schedule a follow-up appointment in: PENDING WITH DR [facility]  Your physician recommends that you continue on your current medications as directed. Please refer to the Current Medication list given to you today.  Your physician has requested that you have an echocardiogram. Echocardiography is a painless test that uses sound waves to create images of your heart. It provides your doctor with information about the size and shape of your heart and how well your heart's chambers and valves are working. This procedure takes approximately one hour. There are no restrictions for this procedure.  Your physician has requested that you have a TEE/Cardioversion. During a TEE, sound waves are used to create images of your heart. It provides your doctor with information about the size and shape of your heart and how well your heart's chambers and valves are working. In this test, a transducer is attached to the end of a flexible tube that is guided down you throat and into your esophagus (the tube leading from your mouth to your stomach) to get a more detailed image of your heart. Once the TEE has determined that a blood clot is not present, the cardioversion begins. Electrical Cardioversion uses a jolt of electricity to your heart either through paddles or wired patches attached to your chest. This is a controlled, usually prescheduled, procedure. This procedure is done at the hospital and you are not awake during the procedure. You usually go home the day of the procedure. Please see the instruction sheet given to you today for more information.  Thank you for choosing [facility]!!

## 2019-05-16 ENCOUNTER — Telehealth: Payer: Self-pay | Admitting: Cardiology

## 2019-05-16 NOTE — Telephone Encounter (Signed)
Pt aware and voiced understanding regarding cardioversion and COVID test that was scheduled today. Pre-op appt is 05/23/19 @1 :45pm COVID test is 245pm same day at [facility]. cardioversion 8/20 @ 9am - pt aware to quarantine after COVID testing until procedure - requested we send her letter with dates - letter mailed

## 2019-05-16 NOTE — Telephone Encounter (Signed)
Pre-cert Verification for the following procedure    Cardioversion 05-25-2019 at [facility]

## 2019-05-16 NOTE — Telephone Encounter (Signed)
Patient called in regards to being scheduled for a Cardioversion and COVID 19 testing.

## 2019-05-17 ENCOUNTER — Ambulatory Visit: Payer: Managed Care, Other (non HMO) | Admitting: Neurology

## 2019-05-19 ENCOUNTER — Encounter: Payer: Self-pay | Admitting: Family Medicine

## 2019-05-19 NOTE — Patient Instructions (Signed)
OKIE JANSSON  05/19/2019     @PREFPERIOPPHARMACY @   Your procedure is scheduled on   05/25/2019  Report to [facility] at  [place].M.  Call this number if you have problems the morning of surgery:  [phone]   Remember:  Do not eat or drink after midnight.                        Take these medicines the morning of surgery with A SIP OF WATER   Bupropion, antivert(if needed), oxybutnin, paxil. Use your inhaler before you come. DO NOT take any medications for diabetes the morning of your procedure.    Do not wear jewelry, make-up or nail polish.  Do not wear lotions, powders, or perfumes. Please wear deodorant and brush your teeth.  Do not shave 48 hours prior to surgery.  Men may shave face and neck.  Do not bring valuables to the hospital.  [facility] is not responsible for any belongings or valuables.  Contacts, dentures or bridgework may not be worn into surgery.  Leave your suitcase in the car.  After surgery it may be brought to your room.  For patients admitted to the hospital, discharge time will be determined by your treatment team.  Patients discharged the day of surgery will not be allowed to drive home.   Name and phone number of your driver:   family Special instructions:   DO NOT miss any doses of your eliquis before your procedure.  Please read over the following fact sheets that you were given. Anesthesia Post-op Instructions and Care and Recovery After Surgery       Electrical Cardioversion, Care After This sheet gives you information about how to care for yourself after your procedure. Your health care provider may also give you more specific instructions. If you have problems or questions, contact your health care provider. What can I expect after the procedure? After the procedure, it is common to have:  Some redness on the skin where the shocks were given. Follow these instructions at home:   Do not drive for 24 hours if you were  given a medicine to help you relax (sedative).  Take over-the-counter and prescription medicines only as told by your health care provider.  Ask your health care provider how to check your pulse. Check it often.  Rest for 48 hours after the procedure or as told by your health care provider.  Avoid or limit your caffeine use as told by your health care provider. Contact a health care provider if:  You feel like your heart is beating too quickly or your pulse is not regular.  You have a serious muscle cramp that does not go away. Get help right away if:   You have discomfort in your chest.  You are dizzy or you feel faint.  You have trouble breathing or you are short of breath.  Your speech is slurred.  You have trouble moving an arm or leg on one side of your body.  Your fingers or toes turn cold or blue. This information is not intended to replace advice given to you by your health care provider. Make sure you discuss any questions you have with your health care provider. Document Released: 07/12/2013 Document Revised: 09/03/2017 Document Reviewed: 03/27/2016 Elsevier Patient Education  2020 [place] After These instructions provide you with information about caring for yourself after your  procedure. Your health care provider may also give you more specific instructions. Your treatment has been planned according to current medical practices, but problems sometimes occur. Call your health care provider if you have any problems or questions after your procedure. What can I expect after the procedure? After your procedure, you may:  Feel sleepy for several hours.  Feel clumsy and have poor balance for several hours.  Feel forgetful about what happened after the procedure.  Have poor judgment for several hours.  Feel nauseous or vomit.  Have a sore throat if you had a breathing tube during the procedure. Follow these instructions at  home: For at least 24 hours after the procedure:      Have a responsible adult stay with you. It is important to have someone help care for you until you are awake and alert.  Rest as needed.  Do not: ? Participate in activities in which you could fall or become injured. ? Drive. ? Use heavy machinery. ? Drink alcohol. ? Take sleeping pills or medicines that cause drowsiness. ? Make important decisions or sign legal documents. ? Take care of children on your own. Eating and drinking  Follow the diet that is recommended by your health care provider.  If you vomit, drink water, juice, or soup when you can drink without vomiting.  Make sure you have little or no nausea before eating solid foods. General instructions  Take over-the-counter and prescription medicines only as told by your health care provider.  If you have sleep apnea, surgery and certain medicines can increase your risk for breathing problems. Follow instructions from your health care provider about wearing your sleep device: ? Anytime you are sleeping, including during daytime naps. ? While taking prescription pain medicines, sleeping medicines, or medicines that make you drowsy.  If you smoke, do not smoke without supervision.  Keep all follow-up visits as told by your health care provider. This is important. Contact a health care provider if:  You keep feeling nauseous or you keep vomiting.  You feel light-headed.  You develop a rash.  You have a fever. Get help right away if:  You have trouble breathing. Summary  For several hours after your procedure, you may feel sleepy and have poor judgment.  Have a responsible adult stay with you for at least 24 hours or until you are awake and alert. This information is not intended to replace advice given to you by your health care provider. Make sure you discuss any questions you have with your health care provider. Document Released: 01/12/2016 Document  Revised: 12/20/2017 Document Reviewed: 01/12/2016 Elsevier Patient Education  2020 [employer].

## 2019-05-22 ENCOUNTER — Encounter (HOSPITAL_COMMUNITY): Payer: Self-pay

## 2019-05-22 ENCOUNTER — Telehealth: Payer: Self-pay

## 2019-05-22 ENCOUNTER — Ambulatory Visit (HOSPITAL_COMMUNITY): Payer: Managed Care, Other (non HMO)

## 2019-05-22 ENCOUNTER — Other Ambulatory Visit: Payer: Self-pay

## 2019-05-22 DIAGNOSIS — R2681 Unsteadiness on feet: Secondary | ICD-10-CM

## 2019-05-22 DIAGNOSIS — R29818 Other symptoms and signs involving the nervous system: Secondary | ICD-10-CM

## 2019-05-22 DIAGNOSIS — R296 Repeated falls: Secondary | ICD-10-CM

## 2019-05-22 DIAGNOSIS — R42 Dizziness and giddiness: Secondary | ICD-10-CM

## 2019-05-22 DIAGNOSIS — R41841 Cognitive communication deficit: Secondary | ICD-10-CM | POA: Diagnosis not present

## 2019-05-22 NOTE — Therapy (Signed)
[place], [name], [address] Phone: [phone]   Fax:  [phone]  Physical Therapy Treatment  Patient Details  Name: Laurie Mckinney MRN: [id number] Date of Birth: 1960/12/08 Referring Provider (PT): Sarina Ill    Encounter Date: 05/22/2019  PT End of Session - 05/22/19 0923    Visit Number  2    Number of Visits  17    Date for PT Re-Evaluation  07/06/19   mini-reassess 06/08/19   Authorization Type  [employer] Managed (60 visits)    Authorization Time Period  05/11/19 to 07/06/19    Authorization - Visit Number  2    Authorization - Number of Visits  10    PT Start Time  0915    PT Stop Time  1000    PT Time Calculation (min)  45 min    Equipment Utilized During Treatment  Gait belt    Activity Tolerance  Patient tolerated treatment well    Behavior During Therapy  [facility] for tasks assessed/performed       Past Medical History:  Diagnosis Date  . Adjustment insomnia    SHIFT WORK  . Allergy    RHINITIS  . Asthma   . Atrial flutter [facility]) 02/26/15   [facility] [place]  . Bursitis    right hip  . Diabetes mellitus without complication ([place])   . Hypertension   . Migraine headache   . Mobitz type 2 second degree heart block   . Obesity     Past Surgical History:  Procedure Laterality Date  . ABDOMINAL HYSTERECTOMY    . COLONOSCOPY  2011   Dr.Brodie  . ROBOTIC ASSISTED TOTAL HYSTERECTOMY WITH BILATERAL SALPINGO OOPHERECTOMY Bilateral 02/05/2015   Procedure: ROBOTIC ASSISTED TOTAL LAPAROSCOPIC HYSTERECTOMY WITH BILATERAL SALPINGO OOPHORECTOMY;  Surgeon: Everitt Amber, MD;  Location: [facility];  Service: Gynecology;  Laterality: Bilateral;    There were no vitals filed for this visit.  Subjective Assessment - 05/22/19 0915    Subjective  Pt reports no changes in meds or medical history. Pt reports going for cardioversion this Thursday. Pt reports "my walking skills are better, but I feel loopy,  uncoordinated, out of breath easily". Pt reports handwriting is not getting any better. Pt reports [place] son went home and [place] son is here looking after her.    Patient Stated Goals  improve coordination, be able to raise up from commode    Currently in Pain?  No/denies           [facility] Adult PT Treatment/Exercise - 05/22/19 0001      Knee/Hip Exercises: Standing   Lateral Step Up  Both;5 reps;Step Height: 6"    Lateral Step Up Limitations  intermittent UE assist    Forward Step Up  Both;5 reps;Step Height: 6"    Forward Step Up Limitations  intermittent UE assist      Knee/Hip Exercises: Seated   Sit to Sand  10 reps   verbal cues for sitting in center of chair     Knee/Hip Exercises: Supine   Bridges  10 reps    Other Supine Knee/Hip Exercises  glute set, hooklying position, 10 reps, 3 sec hold          Balance Exercises - 05/22/19 0943      Balance Exercises: Standing   Retro Gait  4 reps   x2RT in //bars and x2RT on blue line   Sidestepping  2 reps   //  bars   Step Over Hurdles / Cones  four 6" hurdles, leading with Rx2 reps, leading with L x2 reps    Other Standing Exercises  flat foot taps to 6" box, 2x20 reps        PT Education - 05/22/19 0919    Education Details  Reviewed goals, exercise techniques, reviewed/continue HEP    Person(s) Educated  Patient    Methods  Explanation;Demonstration;Verbal cues    Comprehension  Verbalized understanding;Returned demonstration;Verbal cues required       PT Short Term Goals - 05/22/19 7989      PT SHORT TERM GOAL #1   Title  Patient to be compliant with correct performance of HEP, to be updated PRN    Time  4    Period  Weeks    Status  On-going    Target Date  06/08/19      PT SHORT TERM GOAL #2   Title  Patient to show glute and hamstring strength of at least 4+/5 in order to assist with functional tasks such as getting up from low seats and low commode    Time  4    Period  Weeks    Status   On-going      PT SHORT TERM GOAL #3   Title  Patient to score at least 14/24 on DGI in order to show improved mobilty and reduced fall risk    Time  4    Period  Weeks    Status  On-going      PT SHORT TERM GOAL #4   Title  Patient to show improved gait pattern with reduction of wide based stance in order to show improved mobility and balance    Time  4    Period  Weeks    Status  On-going        PT Long Term Goals - 05/22/19 2119      PT LONG TERM GOAL #1   Title  Patient to score at least 18/24 on DGI in order to show reduced fall risk and improved functional balance skills    Time  8    Period  Weeks    Status  On-going      PT LONG TERM GOAL #2   Title  Patient to report no falls within the past 4 weeks in order to show improved functional balance skills and reduced risk of fall-related injury    Time  8    Period  Weeks    Status  On-going      PT LONG TERM GOAL #3   Title  Patient to report resolution of dizziness and vertigo in order to improve QOL and assist in fall prevention    Time  8    Period  Weeks    Status  On-going      PT LONG TERM GOAL #4   Title  Patient to be able to state at least 3 concepts of energy conservation in order to show improved self-regulation skills and prevent patient from over-fatiguing throughout the day    Time  8    Period  Weeks    Status  On-going            Plan - 05/22/19 4174    Clinical Impression Statement  Initiated treatment session with review of goals, HEP review and education on dizziness journal to assist in pinpointing triggers. Added step ups to 6" box and pt demo increased shortness of breath increased concentration  to complete tasks, mod to high guard with BUE, and repositioning of feet 50% of the time to maintain balance. Pt performed flat foot taps to 6" box to challenge SLS time and balance with verbal cues to not put weight through tapping foot on box, but pt performs mini lunge with foot on step pushing  off to return to upright stance despite reps and cues. Added sidestepping, retrogait, stepping over 6" hurdles, and 180 degree turns and pt demonstrates balance deficits throughout session with protective lateral stepping,  min guard with BUE, increased time, repositioning of feet and fair stepping distance judgement hitting obstacles or other feet 50% of the time. Pt unable to clear past either foot with retro walking despite verbal cues. Pt performs STS reps from chair without UE assist, able to perform with good control, but judgement slightly off returning to sitting too far anterior or laterally to seat surface causing part of buttock to not be on chair. Pt without near falls or falls throughout therapy session and did not require physical assistance from therapist to maintain upright balance. Pt with increased focus during activity and maintains eye gaze at feet, but slow to initiate and execute exercises requiring increased time to perform all tasks. Pt reports initial "loopy" feeling, but denies dizziness, headache, lightheadedness, or loopy feeling increase throughout therapy session. Continue to progress as able.    Personal Factors and Comorbidities  Fitness;Comorbidity 2;Social Background;Education;Time since onset of injury/illness/exacerbation    Comorbidities  obesity, A-fib    Examination-Activity Limitations  Transfers;Locomotion Level;Bed Mobility;Reach Overhead;Squat;Stairs;Stand    Examination-Participation Restrictions  Church;Yard Work;Cleaning;Community Activity;Interpersonal Relationship;Volunteer    Stability/Clinical Decision Making  Unstable/Unpredictable    Rehab Potential  Good    PT Frequency  2x / week    PT Duration  8 weeks    PT Treatment/Interventions  ADLs/Self Care Home Management;Canalith Repostioning;DME Instruction;Gait training;Stair training;Functional mobility training;Therapeutic activities;Therapeutic exercise;Balance training;Patient/family  education;Neuromuscular re-education;Manual techniques;Vestibular;Taping;Energy conservation    PT Next Visit Plan  Check BP as needed. f/u on dizzines journal, cardioversion, and sleep study. Progress balance/coordination, hip extensor and hamstring strength. Consider body weight support TM.    PT Home Exercise Plan  Eval: bridges, narrow BOS, partial tandem stance    Consulted and Agree with Plan of Care  Patient       Patient will benefit from skilled therapeutic intervention in order to improve the following deficits and impairments:  Abnormal gait, Decreased coordination, Difficulty walking, Decreased safety awareness, Dizziness, Obesity, Decreased activity tolerance, Impaired vision/preception, Decreased balance, Decreased mobility, Decreased strength  Visit Diagnosis: 1. Other symptoms and signs involving the nervous system   2. Unsteadiness on feet   3. Repeated falls   4. Dizziness and giddiness        Problem List Patient Active Problem List   Diagnosis Date Noted  . Snoring 04/21/2019  . Varicose veins of lower extremity 04/14/2019  . Cough 04/14/2019  . Atrial fibrillation ([place]) 04/14/2019  . DOE (dyspnea on exertion) 04/14/2019  . Disequilibrium 04/13/2019  . Ataxia 04/13/2019  . Hearing loss 04/13/2019  . Aphasia 04/13/2019  . Type 2 diabetes mellitus without complication, without long-term current use of insulin ([place]) 09/29/2018  . Second degree heart block 05/05/2018  . Urinary incontinence 05/05/2018  . Chronic low back pain without sciatica 05/05/2018  . Hypertension associated with diabetes ([place]) 09/20/2017  . Actinic keratosis 02/24/2017  . Dysthymia 01/10/2016  . History of endometrial cancer 02/05/2015    Class: Stage 1  . History of  migraine headaches 03/18/2011  . Allergic rhinitis due to pollen 03/18/2011  . Obesity (BMI 30-39.9) 03/18/2011     Talbot Grumbling PT, DPT 05/22/19, 10:25 AM [place] [address] [place], [name], [address] Phone: [phone]   Fax:  [phone]  Name: Laurie Mckinney MRN: [id number] Date of Birth: Apr 04, 1961

## 2019-05-22 NOTE — Telephone Encounter (Signed)
Per pt she is about the same. She has a cardioversion done Thursday at [facility]. She states she still has a funny feeling in her head. She has a neurology appt following . PT is still being done. Pt would like see Dr. Redmond School for this matter going further. Advised pt I will call her back Thursday after the cardioversion to check on her . [place]

## 2019-05-23 ENCOUNTER — Encounter (HOSPITAL_COMMUNITY)
Admission: RE | Admit: 2019-05-23 | Discharge: 2019-05-23 | Disposition: A | Discharge status: Home / Self Care | Payer: Managed Care, Other (non HMO) | Source: Ambulatory Visit | Attending: Cardiology | Admitting: Cardiology

## 2019-05-23 ENCOUNTER — Other Ambulatory Visit (HOSPITAL_COMMUNITY)
Admission: RE | Admit: 2019-05-23 | Discharge: 2019-05-23 | Disposition: A | Discharge status: Home / Self Care | Payer: Managed Care, Other (non HMO) | Source: Ambulatory Visit | Attending: Cardiology | Admitting: Cardiology

## 2019-05-23 ENCOUNTER — Other Ambulatory Visit: Payer: Self-pay

## 2019-05-23 DIAGNOSIS — E669 Obesity, unspecified: Secondary | ICD-10-CM | POA: Diagnosis not present

## 2019-05-23 DIAGNOSIS — Z7901 Long term (current) use of anticoagulants: Secondary | ICD-10-CM | POA: Diagnosis not present

## 2019-05-23 DIAGNOSIS — Z01812 Encounter for preprocedural laboratory examination: Secondary | ICD-10-CM | POA: Insufficient documentation

## 2019-05-23 DIAGNOSIS — E119 Type 2 diabetes mellitus without complications: Secondary | ICD-10-CM | POA: Diagnosis not present

## 2019-05-23 DIAGNOSIS — I441 Atrioventricular block, second degree: Secondary | ICD-10-CM | POA: Diagnosis not present

## 2019-05-23 DIAGNOSIS — I10 Essential (primary) hypertension: Secondary | ICD-10-CM | POA: Diagnosis not present

## 2019-05-23 DIAGNOSIS — I4891 Unspecified atrial fibrillation: Secondary | ICD-10-CM | POA: Diagnosis present

## 2019-05-23 DIAGNOSIS — J45909 Unspecified asthma, uncomplicated: Secondary | ICD-10-CM | POA: Diagnosis not present

## 2019-05-23 DIAGNOSIS — I4892 Unspecified atrial flutter: Secondary | ICD-10-CM | POA: Diagnosis not present

## 2019-05-23 DIAGNOSIS — Z6837 Body mass index (BMI) 37.0-37.9, adult: Secondary | ICD-10-CM | POA: Diagnosis not present

## 2019-05-23 DIAGNOSIS — Z20828 Contact with and (suspected) exposure to other viral communicable diseases: Secondary | ICD-10-CM | POA: Diagnosis not present

## 2019-05-23 DIAGNOSIS — Z79899 Other long term (current) drug therapy: Secondary | ICD-10-CM | POA: Diagnosis not present

## 2019-05-23 LAB — BASIC METABOLIC PANEL
Anion gap: 8 (ref 5–15)
BUN: 11 mg/dL (ref 6–20)
CO2: 26 mmol/L (ref 22–32)
Calcium: 9 mg/dL (ref 8.9–10.3)
Chloride: 104 mmol/L (ref 98–111)
Creatinine, Ser: 0.66 mg/dL (ref 0.44–1.00)
GFR calc Af Amer: >60 mL/min (ref >60)
GFR calc non Af Amer: >60 mL/min (ref >60)
Glucose, Bld: 91 mg/dL (ref 70–99)
Potassium: 3.9 mmol/L (ref 3.5–5.1)
Sodium: 138 mmol/L (ref 135–145)

## 2019-05-23 LAB — SARS CORONAVIRUS 2 (TAT 6-24 HRS): SARS Coronavirus 2: NEGATIVE

## 2019-05-23 LAB — GLUCOSE, CAPILLARY: Glucose-Capillary: 94 mg/dL (ref 70–99)

## 2019-05-23 LAB — HEMOGLOBIN A1C
Hgb A1c MFr Bld: 6.6 % — ABNORMAL HIGH (ref 4.8–5.6)
Mean Plasma Glucose: 142.72 mg/dL

## 2019-05-24 ENCOUNTER — Telehealth: Payer: Self-pay | Admitting: *Deleted

## 2019-05-24 ENCOUNTER — Other Ambulatory Visit: Payer: Self-pay | Admitting: Family Medicine

## 2019-05-24 DIAGNOSIS — F341 Dysthymic disorder: Secondary | ICD-10-CM

## 2019-05-24 NOTE — Pre-Procedure Instructions (Signed)
HgbA1C routed to PCP. 

## 2019-05-24 NOTE — Telephone Encounter (Signed)
[facility] is requesting to fill pt wellbutrin. Please advise KH 

## 2019-05-24 NOTE — Telephone Encounter (Signed)
Pt aware - routed to pcp  

## 2019-05-24 NOTE — Telephone Encounter (Signed)
-----   Message from Arnoldo Lenis, MD sent at 05/24/2019 11:05 AM EDT ----- Normal labs   Zandra Abts MD

## 2019-05-24 NOTE — Telephone Encounter (Signed)
Pt aware and to quarantine until procedure tomorrow

## 2019-05-24 NOTE — Telephone Encounter (Signed)
-----   Message from Arnoldo Lenis, MD sent at 05/24/2019  9:19 AM EDT ----- Negative COVID screening test   J BrancH MD

## 2019-05-25 ENCOUNTER — Ambulatory Visit (HOSPITAL_COMMUNITY): Payer: Managed Care, Other (non HMO) | Admitting: Anesthesiology

## 2019-05-25 ENCOUNTER — Ambulatory Visit (HOSPITAL_COMMUNITY): Payer: Managed Care, Other (non HMO)

## 2019-05-25 ENCOUNTER — Encounter (HOSPITAL_COMMUNITY)
Admission: RE | Disposition: A | Discharge status: Home / Self Care | Payer: Self-pay | Source: Home / Self Care | Attending: Cardiology

## 2019-05-25 ENCOUNTER — Other Ambulatory Visit: Payer: Self-pay

## 2019-05-25 ENCOUNTER — Ambulatory Visit (HOSPITAL_COMMUNITY)
Admission: RE | Admit: 2019-05-25 | Discharge: 2019-05-25 | Disposition: A | Discharge status: Home / Self Care | Payer: Managed Care, Other (non HMO) | Attending: Cardiology | Admitting: Cardiology

## 2019-05-25 ENCOUNTER — Institutional Professional Consult (permissible substitution): Payer: Managed Care, Other (non HMO) | Admitting: Neurology

## 2019-05-25 DIAGNOSIS — R41841 Cognitive communication deficit: Secondary | ICD-10-CM | POA: Diagnosis not present

## 2019-05-25 DIAGNOSIS — Z79899 Other long term (current) drug therapy: Secondary | ICD-10-CM | POA: Insufficient documentation

## 2019-05-25 DIAGNOSIS — J45909 Unspecified asthma, uncomplicated: Secondary | ICD-10-CM | POA: Insufficient documentation

## 2019-05-25 DIAGNOSIS — I441 Atrioventricular block, second degree: Secondary | ICD-10-CM | POA: Insufficient documentation

## 2019-05-25 DIAGNOSIS — I4891 Unspecified atrial fibrillation: Secondary | ICD-10-CM | POA: Insufficient documentation

## 2019-05-25 DIAGNOSIS — I4892 Unspecified atrial flutter: Secondary | ICD-10-CM | POA: Insufficient documentation

## 2019-05-25 DIAGNOSIS — I10 Essential (primary) hypertension: Secondary | ICD-10-CM | POA: Insufficient documentation

## 2019-05-25 DIAGNOSIS — Z20828 Contact with and (suspected) exposure to other viral communicable diseases: Secondary | ICD-10-CM | POA: Insufficient documentation

## 2019-05-25 DIAGNOSIS — Z7901 Long term (current) use of anticoagulants: Secondary | ICD-10-CM | POA: Insufficient documentation

## 2019-05-25 DIAGNOSIS — Z6837 Body mass index (BMI) 37.0-37.9, adult: Secondary | ICD-10-CM | POA: Insufficient documentation

## 2019-05-25 DIAGNOSIS — E119 Type 2 diabetes mellitus without complications: Secondary | ICD-10-CM | POA: Insufficient documentation

## 2019-05-25 DIAGNOSIS — E669 Obesity, unspecified: Secondary | ICD-10-CM | POA: Insufficient documentation

## 2019-05-25 HISTORY — PX: CARDIOVERSION: SHX1299

## 2019-05-25 LAB — GLUCOSE, CAPILLARY: Glucose-Capillary: 154 mg/dL — ABNORMAL HIGH (ref 70–99)

## 2019-05-25 SURGERY — CARDIOVERSION
Anesthesia: General

## 2019-05-25 MED ORDER — MIDAZOLAM HCL 2 MG/2ML IJ SOLN
INTRAMUSCULAR | Status: AC
  Filled 2019-05-25: qty 2

## 2019-05-25 MED ORDER — PROPOFOL 10 MG/ML IV BOLUS
INTRAVENOUS | Status: DC | PRN
  Administered 2019-05-25: 50 mg via INTRAVENOUS
  Administered 2019-05-25: 40 mg via INTRAVENOUS

## 2019-05-25 MED ORDER — LACTATED RINGERS IV SOLN
INTRAVENOUS | Status: DC
  Administered 2019-05-25: 09:00:00 via INTRAVENOUS

## 2019-05-25 MED ORDER — FENTANYL CITRATE (PF) 100 MCG/2ML IJ SOLN
INTRAMUSCULAR | Status: AC
  Filled 2019-05-25: qty 2

## 2019-05-25 NOTE — Anesthesia Postprocedure Evaluation (Signed)
Anesthesia Post Note  Patient: Laurie Mckinney  Procedure(s) Performed: CARDIOVERSION (N/A )  Patient location during evaluation: PACU Anesthesia Type: General and MAC Level of consciousness: awake and alert, oriented and patient cooperative Pain management: pain level controlled Vital Signs Assessment: post-procedure vital signs reviewed and stable Respiratory status: spontaneous breathing, nonlabored ventilation and patient connected to nasal cannula oxygen Cardiovascular status: stable Postop Assessment: no apparent nausea or vomiting Anesthetic complications: no     Last Vitals:  Vitals:   05/25/19 0820  BP: 138/86  Pulse: 63  Resp: 18  Temp: 37.1 C  SpO2: 96%    Last Pain:  Vitals:   05/25/19 0820  TempSrc: Oral  PainSc: 0-No pain                 Kynsie Falkner

## 2019-05-25 NOTE — Anesthesia Preprocedure Evaluation (Addendum)
Anesthesia Evaluation  Patient identified by MRN, date of birth, ID band Patient awake    Reviewed: Allergy & Precautions, NPO status , Patient's Chart, lab work & pertinent test results  Airway Mallampati: II  TM Distance: >3 FB Neck ROM: Full    Dental no notable dental hx. (+) Teeth Intact   Pulmonary neg pulmonary ROS,  States had uri last year  hasnt used inhaler since 09/2018 Denies known pulm issues  BMI >37 States has had SOB issues since this Afib Dx     Pulmonary exam normal breath sounds clear to auscultation       Cardiovascular Exercise Tolerance: Poor hypertension, Pt. on medications + DOE  Normal cardiovascular exam+ dysrhythmias Atrial Fibrillation II Rhythm:Irregular Rate:Normal  Reports being told had Flutter prior to robotic surg in 2016  Denies recent CP/MI  Has been falling -had Head CT/MRI without acute findings  States hasn't fallen in ~10 days  Was prescribed antivert    Neuro/Psych  Headaches, Depression negative psych ROS   GI/Hepatic negative GI ROS, Neg liver ROS, Denies the use of GERD meds  Denies GERD Sx today    Endo/Other  negative endocrine ROSdiabetesStates Diet Controlled DM  BMI>37  Renal/GU negative Renal ROSReports OAB -states just ran out of meds   negative genitourinary   Musculoskeletal negative musculoskeletal ROS (+)   Abdominal   Peds negative pediatric ROS (+)  Hematology negative hematology ROS (+)   Anesthesia Other Findings   Reproductive/Obstetrics negative OB ROS                            Anesthesia Physical Anesthesia Plan  ASA: III  Anesthesia Plan: General   Post-op Pain Management:    Induction: Intravenous  PONV Risk Score and Plan: 3 and TIVA, Propofol infusion and Treatment may vary due to age or medical condition  Airway Management Planned: Nasal Cannula and Simple Face Mask  Additional Equipment:   Intra-op  Plan:   Post-operative Plan:   Informed Consent: I have reviewed the patients History and Physical, chart, labs and discussed the procedure including the risks, benefits and alternatives for the proposed anesthesia with the patient or authorized representative who has indicated his/her understanding and acceptance.     Dental advisory given  Plan Discussed with: CRNA  Anesthesia Plan Comments: (Plan Full PPE use  Plan GA with GETA as needed d/w pt -WTP with same after Q&A)        Anesthesia Quick Evaluation

## 2019-05-25 NOTE — Discharge Instructions (Signed)
Patient may resume all usual medications, no changes per Dr. Rafael Bihari Cardioversion, Care After This sheet gives you information about how to care for yourself after your procedure. Your health care provider may also give you more specific instructions. If you have problems or questions, contact your health care provider. What can I expect after the procedure? After the procedure, it is common to have:  Some redness on the skin where the shocks were given. Follow these instructions at home:   Do not drive for 24 hours if you were given a medicine to help you relax (sedative).  Take over-the-counter and prescription medicines only as told by your health care provider.  Ask your health care provider how to check your pulse. Check it often.  Rest for 48 hours after the procedure or as told by your health care provider.  Avoid or limit your caffeine use as told by your health care provider. Contact a health care provider if:  You feel like your heart is beating too quickly or your pulse is not regular.  You have a serious muscle cramp that does not go away. Get help right away if:   You have discomfort in your chest.  You are dizzy or you feel faint.  You have trouble breathing or you are short of breath.  Your speech is slurred.  You have trouble moving an arm or leg on one side of your body.  Your fingers or toes turn cold or blue. This information is not intended to replace advice given to you by your health care provider. Make sure you discuss any questions you have with your health care provider. Document Released: 07/12/2013 Document Revised: 09/03/2017 Document Reviewed: 03/27/2016 Elsevier Patient Education  2020 [place] After These instructions provide you with information about caring for yourself after your procedure. Your health care provider may also give you more specific instructions. Your  treatment has been planned according to current medical practices, but problems sometimes occur. Call your health care provider if you have any problems or questions after your procedure. What can I expect after the procedure? After your procedure, you may:  Feel sleepy for several hours.  Feel clumsy and have poor balance for several hours.  Feel forgetful about what happened after the procedure.  Have poor judgment for several hours.  Feel nauseous or vomit.  Have a sore throat if you had a breathing tube during the procedure. Follow these instructions at home: For at least 24 hours after the procedure:      Have a responsible adult stay with you. It is important to have someone help care for you until you are awake and alert.  Rest as needed.  Do not: ? Participate in activities in which you could fall or become injured. ? Drive. ? Use heavy machinery. ? Drink alcohol. ? Take sleeping pills or medicines that cause drowsiness. ? Make important decisions or sign legal documents. ? Take care of children on your own. Eating and drinking  Follow the diet that is recommended by your health care provider.  If you vomit, drink water, juice, or soup when you can drink without vomiting.  Make sure you have little or no nausea before eating solid foods. General instructions  Take over-the-counter and prescription medicines only as told by your health care provider.  If you have sleep apnea, surgery and certain medicines can increase your risk for  breathing problems. Follow instructions from your health care provider about wearing your sleep device: ? Anytime you are sleeping, including during daytime naps. ? While taking prescription pain medicines, sleeping medicines, or medicines that make you drowsy.  If you smoke, do not smoke without supervision.  Keep all follow-up visits as told by your health care provider. This is important. Contact a health care provider  if:  You keep feeling nauseous or you keep vomiting.  You feel light-headed.  You develop a rash.  You have a fever. Get help right away if:  You have trouble breathing. Summary  For several hours after your procedure, you may feel sleepy and have poor judgment.  Have a responsible adult stay with you for at least 24 hours or until you are awake and alert. This information is not intended to replace advice given to you by your health care provider. Make sure you discuss any questions you have with your health care provider. Document Released: 01/12/2016 Document Revised: 12/20/2017 Document Reviewed: 01/12/2016 Elsevier Patient Education  2020 [employer].

## 2019-05-25 NOTE — CV Procedure (Signed)
Procedure: Electrical cardioversion Indication: Afib Physician: Dr Carlyle Dolly   Patient was brought to the procedure suite after appropriate consent was obtained. Sedation was achieved with the assistance of anesthesiology, please see there note for full details. Defib pads were placed in the anterior and posterior postions, she was succesfully cardioverted to sinus rhythm with first degree AV block. Cardiopulmonary monitoring was performed throughout the procedure, she tolerated well without complications.   Carlyle Dolly MD

## 2019-05-25 NOTE — Transfer of Care (Signed)
Immediate Anesthesia Transfer of Care Note  Patient: Laurie Mckinney  Procedure(s) Performed: CARDIOVERSION (N/A )  Patient Location: PACU  Anesthesia Type:MAC  Level of Consciousness: awake, alert  and oriented  Airway & Oxygen Therapy: Patient Spontanous Breathing and Patient connected to nasal cannula oxygen  Post-op Assessment: Report given to RN and Post -op Vital signs reviewed and stable  Post vital signs: Reviewed and stable  Last Vitals:  Vitals Value Taken Time  BP    Temp    Pulse 73 05/25/19 0914  Resp 14 05/25/19 0914  SpO2 96 % 05/25/19 0914  Vitals shown include unvalidated device data.  Last Pain:  Vitals:   05/25/19 0820  TempSrc: Oral  PainSc: 0-No pain      Patients Stated Pain Goal: 5 (35/82/51 8984)  Complications: No apparent anesthesia complications

## 2019-05-25 NOTE — Addendum Note (Signed)
Addendum  created 05/25/19 1041 by Jonna Munro, CRNA   Review and Sign - Signed

## 2019-05-25 NOTE — Progress Notes (Signed)
Mild erythema noted to mid sternum, and left mid back lateral.  No symptoms per patient.

## 2019-05-25 NOTE — H&P (Signed)
Patient presents for elective outpatient cardioversion for afib. Please refer to refercenced recent clinic note below for full history, she has been compliant with eliquis without any missed doses over the last 3 weeks   Carlyle Dolly MD   Clinical Summary  Ms. Buskirk is a 58 y.o.female seen today for follow up of the following medical problems.  1. AFib/Aflutter  - previously seen I afib clinic  - history of aflutter in 2016 postop  - relatively new diagnosis of afib earlier this year by pcp  - avoiding av nodal agents due to history of 2nd degree AV block type I  - from afib clinic would consider dofetilide if neccesary  - has been on eliquis.  - she declined DCCV at last afib clinic visit.  - sporadic palpitations, short in duration. Dizziness and SOB unclear if afib related.  2. SOB/DOE  - dyspnea just walking in from parking lot  - significantly worsened over the summer  - mild swelling at times when sitting long times.  - some orthopnea.  3. Chest pain  - sharp pain midchest to epigastric, 5-10/10 in severity. Ongoing for few years  - can occur at any time. Not positional. Somewhat better with antacids.  - no other associated symptoms  - reports remote stress test in the past.  01/2015 GXT no ischemic changes.  4. Dizziness  - difficulty with gait. Feels lightheaded,  - mainly occurs when on her feet.  - can have some SOB associated  - reports multiple falls. Leg weakness and dizziness. Coordination is off.  5. Aphasia/Fatigue  - followed by neurology  - working with PT  - thought to be a large emotional/stress related component from neuro notes  - awaiting sleep study      Past Medical History:  Diagnosis Date  . Adjustment insomnia    SHIFT WORK  . Allergy    RHINITIS  . Asthma   . Atrial flutter [facility]) 02/26/15   [facility] [place]  . Bursitis    right hip  . Diabetes mellitus without complication ([place])   . Hypertension   . Migraine headache   .  Mobitz type 2 second degree heart block   . Obesity   No Known Allergies        Current Outpatient Medications  Medication Sig Dispense Refill  . acetaminophen (TYLENOL ARTHRITIS PAIN) 650 MG CR tablet Take 650 mg by mouth every 8 (eight) hours as needed for pain.    Marland Kitchen albuterol (PROVENTIL HFA;VENTOLIN HFA) 108 (90 Base) MCG/ACT inhaler TAKE 2 PUFFS BY MOUTH EVERY 6 HOURS AS NEEDED FOR WHEEZE OR SHORTNESS OF BREATH (Patient taking differently: Inhale 2 puffs into the lungs every 4 (four) hours as needed for wheezing. ) 6.7 Inhaler 0  . apixaban (ELIQUIS) 5 MG TABS tablet Take 1 tablet (5 mg total) by mouth 2 (two) times daily. 60 tablet 2  . buPROPion (WELLBUTRIN SR) 100 MG 12 hr tablet Take 1 tablet (100 mg total) by mouth daily. 90 tablet 3  . lisinopril-hydrochlorothiazide (ZESTORETIC) 10-12.5 MG tablet Take 1 tablet by mouth daily. 30 tablet 0  . meclizine (ANTIVERT) 25 MG tablet Take 1 tablet (25 mg total) by mouth 3 (three) times daily as needed for dizziness. 20 tablet 1  . Multiple Vitamins-Minerals (MULTIVITAMIN WITH MINERALS) tablet Take 1 tablet by mouth every morning.     Marland Kitchen oxybutynin (DITROPAN XL) 10 MG 24 hr tablet Take 1 tablet (10 mg total) by mouth at bedtime. [place]  tablet 3  . PARoxetine (PAXIL) 20 MG tablet Take 1 tablet (20 mg total) by mouth daily. 90 tablet 3   No current facility-administered medications for this visit.         Past Surgical History:  Procedure Laterality Date  . ABDOMINAL HYSTERECTOMY    . COLONOSCOPY  2011   Dr.Brodie  . ROBOTIC ASSISTED TOTAL HYSTERECTOMY WITH BILATERAL SALPINGO OOPHERECTOMY Bilateral 02/05/2015   Procedure: ROBOTIC ASSISTED TOTAL LAPAROSCOPIC HYSTERECTOMY WITH BILATERAL SALPINGO OOPHORECTOMY; Surgeon: Everitt Amber, MD; Location: [facility]; Service: Gynecology; Laterality: Bilateral;  No Known Allergies       Family History  Problem Relation Age of Onset  . Arthritis Mother   . Heart disease Father   Social History  Ms. Bonds reports  that she has never smoked. She has never used smokeless tobacco.  Ms. Tassinari reports no history of alcohol use.  Review of Systems  CONSTITUTIONAL: No weight loss, fever, chills, weakness or fatigue.  HEENT: Eyes: No visual loss, blurred vision, double vision or yellow sclerae.No hearing loss, sneezing, congestion, runny nose or sore throat.  SKIN: No rash or itching.  CARDIOVASCULAR: per hpi  RESPIRATORY: No shortness of breath, cough or sputum.  GASTROINTESTINAL: No anorexia, nausea, vomiting or diarrhea. No abdominal pain or blood.  GENITOURINARY: No burning on urination, no polyuria  NEUROLOGICAL: per hpi  MUSCULOSKELETAL: No muscle, back pain, joint pain or stiffness.  LYMPHATICS: No enlarged nodes. No history of splenectomy.  PSYCHIATRIC: No history of depression or anxiety.  ENDOCRINOLOGIC: No reports of sweating, cold or heat intolerance. No polyuria or polydipsia.  Marland Kitchen  Physical Examination     Today's Vitals   05/15/19 1340  BP: 138/84  Pulse: 79  SpO2: 96%  Weight: 277 lb 6.4 oz (125.8 kg)  Height: 6' (1.829 m)  Body mass index is 37.62 kg/m.  Gen: resting comfortably, no acute distress  HEENT: no scleral icterus, pupils equal round and reactive, no palptable cervical adenopathy,  CV: irreg, no mr/g, no jvd  Resp: Clear to auscultation bilaterally  GI: abdomen is soft, non-tender, non-distended, normal bowel sounds, no hepatosplenomegaly  MSK: extremities are warm, no edema.  Skin: warm, no rash  Neuro: no focal deficits  Psych: appropriate affect  -orthostatics negative in clinic   Assessment and Plan  1. Afib  - unclear if playing any role in her constellation of SOB, fatigue, dizziness. Would not explain some of her neuro symptoms like "slow thoughts" and "feelign loopy"  - we will try a cardioversion, will need to pay close attention to her symptoms once back in SR to decipher if related to afib or not, would help guide if would need to consider long term rhythm  strategy if she reverts back to afib. Currently she is rate controlled afib, may be a patient that just does not tolerate afib despite rate control  -  Arnoldo Lenis, M.D

## 2019-05-25 NOTE — Progress Notes (Signed)
Electrical Cardioversion Procedure Note Laurie Mckinney [id number] 01-19-1961  Procedure: Electrical Cardioversion Indications:  Atrial Fibrillation  Procedure Details Consent: Risks of procedure as well as the alternatives and risks of each were explained to the (patient/caregiver).  Consent for procedure obtained. Time Out: Verified patient identification, verified procedure, site/side was marked, verified correct patient position, special equipment/implants available, medications/allergies/relevent history reviewed, required imaging and test results available.  Performed at [phone]  Patient placed on cardiac monitor, pulse oximetry, supplemental oxygen as necessary.  Sedation given: see Anesthesia note Pacer pads placed anterior and posterior chest.  Cardioverted 1 time(s).  Cardioverted at 200 J  Evaluation Findings: Post procedure EKG shows: [place] Complications: None Patient did tolerate procedure well.   [place], [place] 05/25/2019, 9:22 AM

## 2019-05-26 ENCOUNTER — Encounter (HOSPITAL_COMMUNITY): Payer: Self-pay | Admitting: Cardiology

## 2019-05-26 ENCOUNTER — Telehealth: Payer: Self-pay | Admitting: Family Medicine

## 2019-05-26 NOTE — Telephone Encounter (Signed)
Pt's husband dropped off forms to be completed. Sending back to be completed.

## 2019-05-29 ENCOUNTER — Encounter: Payer: Self-pay | Admitting: Family Medicine

## 2019-05-29 ENCOUNTER — Other Ambulatory Visit: Payer: Self-pay

## 2019-05-29 ENCOUNTER — Encounter (HOSPITAL_COMMUNITY): Payer: Self-pay

## 2019-05-29 ENCOUNTER — Ambulatory Visit (HOSPITAL_COMMUNITY): Payer: Managed Care, Other (non HMO)

## 2019-05-29 ENCOUNTER — Telehealth: Payer: Self-pay | Admitting: Family Medicine

## 2019-05-29 DIAGNOSIS — R42 Dizziness and giddiness: Secondary | ICD-10-CM

## 2019-05-29 DIAGNOSIS — R296 Repeated falls: Secondary | ICD-10-CM

## 2019-05-29 DIAGNOSIS — R29818 Other symptoms and signs involving the nervous system: Secondary | ICD-10-CM

## 2019-05-29 DIAGNOSIS — R41841 Cognitive communication deficit: Secondary | ICD-10-CM | POA: Diagnosis not present

## 2019-05-29 DIAGNOSIS — R2681 Unsteadiness on feet: Secondary | ICD-10-CM

## 2019-05-29 NOTE — Therapy (Signed)
[facility] [address] [place], [name], [address] Phone: [phone]   Fax:  [phone]  Physical Therapy Treatment  Patient Details  Name: Laurie Mckinney MRN: [id number] Date of Birth: 17-Nov-1960 Referring Provider (PT): Sarina Ill    Encounter Date: 05/29/2019  PT End of Session - 05/29/19 0910    Visit Number  3    Number of Visits  17    Date for PT Re-Evaluation  07/06/19   mini-reassess 06/08/19   Authorization Type  [employer] Managed (60 visits)    Authorization Time Period  05/11/19 to 07/06/19    Authorization - Visit Number  3    Authorization - Number of Visits  10    PT Start Time  0910    PT Stop Time  0955    PT Time Calculation (min)  45 min    Equipment Utilized During Treatment  Gait belt    Activity Tolerance  Patient tolerated treatment well    Behavior During Therapy  [facility] for tasks assessed/performed       Past Medical History:  Diagnosis Date  . Adjustment insomnia    SHIFT WORK  . Allergy    RHINITIS  . Asthma   . Atrial flutter [facility]) 02/26/15   [facility] [place]  . Bursitis    right hip  . Diabetes mellitus without complication ([place])   . Hypertension   . Migraine headache   . Mobitz type 2 second degree heart block   . Obesity     Past Surgical History:  Procedure Laterality Date  . ABDOMINAL HYSTERECTOMY    . CARDIOVERSION N/A 05/25/2019   Procedure: CARDIOVERSION;  Surgeon: Arnoldo Lenis, MD;  Location: [facility];  Service: Endoscopy;  Laterality: N/A;  . COLONOSCOPY  2011   Dr.Brodie  . ROBOTIC ASSISTED TOTAL HYSTERECTOMY WITH BILATERAL SALPINGO OOPHERECTOMY Bilateral 02/05/2015   Procedure: ROBOTIC ASSISTED TOTAL LAPAROSCOPIC HYSTERECTOMY WITH BILATERAL SALPINGO OOPHORECTOMY;  Surgeon: Everitt Amber, MD;  Location: [facility];  Service: Gynecology;  Laterality: Bilateral;    There were no vitals filed for this visit.  Subjective Assessment - 05/29/19 0911    Subjective  Pt reports  no falls in the last week and she feels better balanced, but still not 100%. Pt reports cardioversion went well, only had 1 shock and heart went back into rhythm. Pt rescheduled sleep consult for Sept 1st. Pt reports still tiring really easy phyiscally, also mentally exhausted after 30 minutes on phone. Pt reports hoping to go back to work. Pt denies dizziness, but feels like she can't do hand-body coordinated tasks.    Patient Stated Goals  improve coordination, be able to raise up from commode    Currently in Pain?  No/denies          [facility] Adult PT Treatment/Exercise - 05/29/19 0001      Knee/Hip Exercises: Stretches   Active Hamstring Stretch  Both;2 reps;30 seconds    Active Hamstring Stretch Limitations  12" step      Knee/Hip Exercises: Standing   Heel Raises  Both;10 reps    Lateral Step Up  10 reps    Lateral Step Up Limitations  constant UE assist    Forward Step Up  10 reps    Forward Step Up Limitations  intermittent UE assist      Knee/Hip Exercises: Seated   Sit to Sand  10 reps          Balance Exercises - 05/29/19  0950      Balance Exercises: Standing   Tandem Gait  Forward;Foam/compliant surface   single UE support   Retro Gait  1 rep   blue line, no UE assist   Sidestepping  1 rep   blue line, no UE assist   Cone Rotation Limitations  upright standing with BUE flexion, 1# bar, x10 reps; narrow BOS with BUE flexion, 1# bar, x10 reps; tandem stance with BUE flexion, 1# bar, x10 reps each LE back    Other Standing Exercises  flat foot taps to 6" box, x15 reps; flat foot taps to cones at midline, x5 reps each LE        PT Education - 05/29/19 0913    Education Details  Exercise technique, updated HEP    Person(s) Educated  Patient    Methods  Explanation    Comprehension  Verbalized understanding       PT Short Term Goals - 05/22/19 0922      PT SHORT TERM GOAL #1   Title  Patient to be compliant with correct performance of HEP, to be updated PRN     Time  4    Period  Weeks    Status  On-going    Target Date  06/08/19      PT SHORT TERM GOAL #2   Title  Patient to show glute and hamstring strength of at least 4+/5 in order to assist with functional tasks such as getting up from low seats and low commode    Time  4    Period  Weeks    Status  On-going      PT SHORT TERM GOAL #3   Title  Patient to score at least 14/24 on DGI in order to show improved mobilty and reduced fall risk    Time  4    Period  Weeks    Status  On-going      PT SHORT TERM GOAL #4   Title  Patient to show improved gait pattern with reduction of wide based stance in order to show improved mobility and balance    Time  4    Period  Weeks    Status  On-going        PT Long Term Goals - 05/22/19 [id number]      PT LONG TERM GOAL #1   Title  Patient to score at least 18/24 on DGI in order to show reduced fall risk and improved functional balance skills    Time  8    Period  Weeks    Status  On-going      PT LONG TERM GOAL #2   Title  Patient to report no falls within the past 4 weeks in order to show improved functional balance skills and reduced risk of fall-related injury    Time  8    Period  Weeks    Status  On-going      PT LONG TERM GOAL #3   Title  Patient to report resolution of dizziness and vertigo in order to improve QOL and assist in fall prevention    Time  8    Period  Weeks    Status  On-going      PT LONG TERM GOAL #4   Title  Patient to be able to state at least 3 concepts of energy conservation in order to show improved self-regulation skills and prevent patient from over-fatiguing throughout the day    Time  8  Period  Weeks    Status  On-going            Plan - 05/29/19 0915    Clinical Impression Statement  Pt continues to have difficulty sequencing tasks requiring increased attention and verbal cues for counting reps and technique. When answering questions, pt easily distracted, segues to different topics, and  requires reminders of question to properly answer. Pt uses multiple foot repositions to maintain balance with occasional weight-shift posterior requiring protective stepping and trunk uprighting to return to normal postural alignment and prevent loss of balance. Pt with increased balance challenge performs BUE flexion with feet positioned in narrow BOS and tandem, requiring protective stepping and trunk righting to prevent loss of balance. Pt unable to perform flat foot taps to box or cone without putting weight into tapping foot, also requires occasional UE assist to prevent loss of balance.  Pt challenged with gait and head turns R/L and up/down requiring protective stepping laterally and performs with decreased speed. Pt requires single UE assist on wall with tandem gait on foam surface and steps off x1 to prevent loss of balance. Pt with improved STS reps from chair, able to perform without UE assist, but requires increased time and attention to complete reps. Pt reports compliance but continued difficulty with HEP. Continue to progress as able.    Personal Factors and Comorbidities  Fitness;Comorbidity 2;Social Background;Education;Time since onset of injury/illness/exacerbation    Comorbidities  obesity, A-fib    Examination-Activity Limitations  Transfers;Locomotion Level;Bed Mobility;Reach Overhead;Squat;Stairs;Stand    Examination-Participation Restrictions  Church;Yard Work;Cleaning;Community Activity;Interpersonal Relationship;Volunteer    Stability/Clinical Decision Making  Unstable/Unpredictable    Rehab Potential  Good    PT Frequency  2x / week    PT Duration  8 weeks    PT Treatment/Interventions  ADLs/Self Care Home Management;[place] Repostioning;DME Instruction;Gait training;Stair training;Functional mobility training;Therapeutic activities;Therapeutic exercise;Balance training;Patient/family education;Neuromuscular re-education;Manual techniques;Vestibular;Taping;Energy conservation     PT Next Visit Plan  f/u on sleep study. Progress balance/coordination, hip extensor and hamstring strength. Challenge coordination vs. attention for activities. Consider body weight support TM.    PT Home Exercise Plan  Eval: bridges, narrow BOS, partial tandem stance; 8/24: calf raises with UE support, STS without UE assist    Consulted and Agree with Plan of Care  Patient       Patient will benefit from skilled therapeutic intervention in order to improve the following deficits and impairments:  Abnormal gait, Decreased coordination, Difficulty walking, Decreased safety awareness, Dizziness, Obesity, Decreased activity tolerance, Impaired vision/preception, Decreased balance, Decreased mobility, Decreased strength  Visit Diagnosis: Other symptoms and signs involving the nervous system  Unsteadiness on feet  Repeated falls  Dizziness and giddiness     Problem List Patient Active Problem List   Diagnosis Date Noted  . Snoring 04/21/2019  . Varicose veins of lower extremity 04/14/2019  . Cough 04/14/2019  . Atrial fibrillation ([place]) 04/14/2019  . DOE (dyspnea on exertion) 04/14/2019  . Disequilibrium 04/13/2019  . Ataxia 04/13/2019  . Hearing loss 04/13/2019  . Aphasia 04/13/2019  . Type 2 diabetes mellitus without complication, without long-term current use of insulin ([place]) 09/29/2018  . Second degree heart block 05/05/2018  . Urinary incontinence 05/05/2018  . Chronic low back pain without sciatica 05/05/2018  . Hypertension associated with diabetes ([place]) 09/20/2017  . Actinic keratosis 02/24/2017  . Dysthymia 01/10/2016  . History of endometrial cancer 02/05/2015    Class: Stage 1  . History of migraine headaches 03/18/2011  . Allergic rhinitis due  to pollen 03/18/2011  . Obesity (BMI 30-39.9) 03/18/2011     Talbot Grumbling PT, DPT 05/29/19, 10:10 AM [facility] [address] [place], [name],  [address] Phone: [phone]   Fax:  [phone]  Name: TAHLIA TRABUE MRN: [id number] Date of Birth: [birth year]

## 2019-05-29 NOTE — Telephone Encounter (Signed)
Left message that paperwork needs to be filled out by cardio or neuro per jcl put in folder to be picked up

## 2019-06-01 ENCOUNTER — Other Ambulatory Visit: Payer: Self-pay

## 2019-06-01 ENCOUNTER — Ambulatory Visit (HOSPITAL_COMMUNITY): Payer: Managed Care, Other (non HMO)

## 2019-06-01 ENCOUNTER — Encounter (HOSPITAL_COMMUNITY): Payer: Self-pay

## 2019-06-01 DIAGNOSIS — R42 Dizziness and giddiness: Secondary | ICD-10-CM

## 2019-06-01 DIAGNOSIS — R296 Repeated falls: Secondary | ICD-10-CM

## 2019-06-01 DIAGNOSIS — R29818 Other symptoms and signs involving the nervous system: Secondary | ICD-10-CM

## 2019-06-01 DIAGNOSIS — R2681 Unsteadiness on feet: Secondary | ICD-10-CM

## 2019-06-01 DIAGNOSIS — R41841 Cognitive communication deficit: Secondary | ICD-10-CM | POA: Diagnosis not present

## 2019-06-01 NOTE — Therapy (Signed)
[name], [name], [address] Phone: [phone]   Fax:  [phone]  Physical Therapy Treatment  Patient Details  Name: Laurie Mckinney MRN: [id number] Date of Birth: 1961/01/27 Referring Provider (PT): Sarina Ill    Encounter Date: 06/01/2019  PT End of Session - 06/01/19 0814    Visit Number  4    Number of Visits  17    Date for PT Re-Evaluation  07/06/19   mini-reassess 06/08/19   Authorization Type  [employer] Managed (9 visits)    Authorization Time Period  05/11/19 to 07/06/19    Authorization - Visit Number  4    Authorization - Number of Visits  10    PT Start Time  0815    PT Stop Time  [id number]    PT Time Calculation (min)  40 min    Equipment Utilized During Treatment  Gait belt    Activity Tolerance  Patient tolerated treatment well    Behavior During Therapy  [facility] for tasks assessed/performed       Past Medical History:  Diagnosis Date  . Adjustment insomnia    SHIFT WORK  . Allergy    RHINITIS  . Asthma   . Atrial flutter [facility]) 02/26/15   [facility] [place]  . Bursitis    right hip  . Diabetes mellitus without complication ([place])   . Hypertension   . Migraine headache   . Mobitz type 2 second degree heart block   . Obesity     Past Surgical History:  Procedure Laterality Date  . ABDOMINAL HYSTERECTOMY    . CARDIOVERSION N/A 05/25/2019   Procedure: CARDIOVERSION;  Surgeon: Arnoldo Lenis, MD;  Location: [facility];  Service: Endoscopy;  Laterality: N/A;  . COLONOSCOPY  2011   Dr.Brodie  . ROBOTIC ASSISTED TOTAL HYSTERECTOMY WITH BILATERAL SALPINGO OOPHERECTOMY Bilateral 02/05/2015   Procedure: ROBOTIC ASSISTED TOTAL LAPAROSCOPIC HYSTERECTOMY WITH BILATERAL SALPINGO OOPHORECTOMY;  Surgeon: Everitt Amber, MD;  Location: [facility];  Service: Gynecology;  Laterality: Bilateral;    There were no vitals filed for this visit.  Subjective Assessment - 06/01/19 0813    Subjective  Pt reports  still feels wobbly, but no pain and no dizziness. Pt reports exercises are going good. Pt reports when retrieving objects from kitchen cabinet she knocks down everything else trying to get something out.    Patient Stated Goals  improve coordination, be able to raise up from commode    Currently in Pain?  No/denies            [facility] Adult PT Treatment/Exercise - 06/01/19 0001      Knee/Hip Exercises: Standing   Heel Raises  Both;20 reps    Heel Raises Limitations  toe raises, 15 reps    Lateral Step Up  Both;10 reps;Step Height: 6"    Lateral Step Up Limitations  no UE assist      Knee/Hip Exercises: Seated   Sit to Sand  10 reps          Balance Exercises - 06/01/19 0900      Balance Exercises: Standing   Tandem Stance  Eyes open;Foam/compliant surface;5 reps;30 secs    Balance Beam  blue line, x2RT    Retro Gait  2 reps   blue line   Sidestepping  2 reps   blue line, no UE assist   Cone Rotation Limitations  tandem stance with BUE flexion, 2# bar, x10 reps each LE back;  tandem stance with paloff press, 2# bar, x10 reps each LE back    Marching Limitations  heel walking, x1RT blue line    Toe Raise Limitations  weighted walking retro/forward, 50#    Other Standing Exercises  flat foot taps to cones at midline, x10 reps each LE        PT Education - 06/01/19 0819    Education Details  Updated HEP, exercise technique    Person(s) Educated  Patient    Methods  Explanation    Comprehension  Verbalized understanding       PT Short Term Goals - 05/22/19 0922      PT SHORT TERM GOAL #1   Title  Patient to be compliant with correct performance of HEP, to be updated PRN    Time  4    Period  Weeks    Status  On-going    Target Date  06/08/19      PT SHORT TERM GOAL #2   Title  Patient to show glute and hamstring strength of at least 4+/5 in order to assist with functional tasks such as getting up from low seats and low commode    Time  4    Period  Weeks     Status  On-going      PT SHORT TERM GOAL #3   Title  Patient to score at least 14/24 on DGI in order to show improved mobilty and reduced fall risk    Time  4    Period  Weeks    Status  On-going      PT SHORT TERM GOAL #4   Title  Patient to show improved gait pattern with reduction of wide based stance in order to show improved mobility and balance    Time  4    Period  Weeks    Status  On-going        PT Long Term Goals - 05/22/19 [id number]      PT LONG TERM GOAL #1   Title  Patient to score at least 18/24 on DGI in order to show reduced fall risk and improved functional balance skills    Time  8    Period  Weeks    Status  On-going      PT LONG TERM GOAL #2   Title  Patient to report no falls within the past 4 weeks in order to show improved functional balance skills and reduced risk of fall-related injury    Time  8    Period  Weeks    Status  On-going      PT LONG TERM GOAL #3   Title  Patient to report resolution of dizziness and vertigo in order to improve QOL and assist in fall prevention    Time  8    Period  Weeks    Status  On-going      PT LONG TERM GOAL #4   Title  Patient to be able to state at least 3 concepts of energy conservation in order to show improved self-regulation skills and prevent patient from over-fatiguing throughout the day    Time  8    Period  Weeks    Status  On-going            Plan - 06/01/19 [id number]    Clinical Impression Statement  Continued with pt's established POC this date. Pt with improved coordination and sequencing, able to perform step ups to 6" box without UE support,  without foot repositioning and no loss of balance backwards. Pt performs flat foot taps to cone without placing weight into front tapping foot, increased momentum but able to maintain balance and without UE assist and good coordination of BLE. Pt performs heel walking with 1 loss of balance requiring protective stepping laterally and able to recover without  physical assistance from therapist. Pt performs retro walking and heel-toe walking with BUE at mid guard, with improved balance with increased speed and multiple losses of balance requiring protective stepping laterally and anterior when decreased speed. Pt able to maintain correct sequencing and rep count with minimal verbal cues throughout therapy session. Pt demonstrates improvements in activities due to learned technique, continues to have increased difficulty with new exercises causing balance challenges. Continue to progress as able.    Personal Factors and Comorbidities  Fitness;Comorbidity 2;Social Background;Education;Time since onset of injury/illness/exacerbation    Comorbidities  obesity, A-fib    Examination-Activity Limitations  Transfers;Locomotion Level;Bed Mobility;Reach Overhead;Squat;Stairs;Stand    Examination-Participation Restrictions  Church;Yard Work;Cleaning;Community Activity;Interpersonal Relationship;Volunteer    Stability/Clinical Decision Making  Unstable/Unpredictable    Rehab Potential  Good    PT Frequency  2x / week    PT Duration  8 weeks    PT Treatment/Interventions  ADLs/Self Care Home Management;Canalith Repostioning;DME Instruction;Gait training;Stair training;Functional mobility training;Therapeutic activities;Therapeutic exercise;Balance training;Patient/family education;Neuromuscular re-education;Manual techniques;Vestibular;Taping;Energy conservation    PT Next Visit Plan  Progress balance/coordination, hip extensor and hamstring strength. Challenge coordination vs. attention for activities. Consider body weight support TM. f/u on sleep study 9/1 and PCP appointment 9/1    PT Home Exercise Plan  Eval: bridges, narrow BOS, partial tandem stance; 8/24: calf raises with UE support, STS without UE assist; 8/27: sidestepping and tandem stance with UE support    Consulted and Agree with Plan of Care  Patient       Patient will benefit from skilled therapeutic  intervention in order to improve the following deficits and impairments:  Abnormal gait, Decreased coordination, Difficulty walking, Decreased safety awareness, Dizziness, Obesity, Decreased activity tolerance, Impaired vision/preception, Decreased balance, Decreased mobility, Decreased strength  Visit Diagnosis: Other symptoms and signs involving the nervous system  Unsteadiness on feet  Repeated falls  Dizziness and giddiness     Problem List Patient Active Problem List   Diagnosis Date Noted  . Snoring 04/21/2019  . Varicose veins of lower extremity 04/14/2019  . Cough 04/14/2019  . Atrial fibrillation ([place]) 04/14/2019  . DOE (dyspnea on exertion) 04/14/2019  . Disequilibrium 04/13/2019  . Ataxia 04/13/2019  . Hearing loss 04/13/2019  . Aphasia 04/13/2019  . Type 2 diabetes mellitus without complication, without long-term current use of insulin ([place]) 09/29/2018  . Second degree heart block 05/05/2018  . Urinary incontinence 05/05/2018  . Chronic low back pain without sciatica 05/05/2018  . Hypertension associated with diabetes ([place]) 09/20/2017  . Actinic keratosis 02/24/2017  . Dysthymia 01/10/2016  . History of endometrial cancer 02/05/2015    Class: Stage 1  . History of migraine headaches 03/18/2011  . Allergic rhinitis due to pollen 03/18/2011  . Obesity (BMI 30-39.9) 03/18/2011     Talbot Grumbling PT, DPT 06/01/19, 9:02 AM [place] [address] [place], [name], [address] Phone: [phone]   Fax:  [phone]  Name: Laurie Mckinney MRN: [id number] Date of Birth: 06-Nov-1960

## 2019-06-05 ENCOUNTER — Encounter (HOSPITAL_COMMUNITY): Payer: Self-pay

## 2019-06-05 ENCOUNTER — Ambulatory Visit (HOSPITAL_COMMUNITY): Payer: Managed Care, Other (non HMO)

## 2019-06-05 ENCOUNTER — Other Ambulatory Visit: Payer: Self-pay

## 2019-06-05 NOTE — Therapy (Signed)
[place], [name], [address] Phone: [phone]   Fax:  [phone]  Patient Details  Name: IRAIS AULBACH MRN: [id number] Date of Birth: [birth year] Referring Provider:  Denita Lung, MD  Encounter Date: 06/05/2019  Pt arrived, not seen due to therapist conflict.   Talbot Grumbling PT, DPT 06/05/19, 8:24 AM [place], [name], [address] Phone: [phone]   Fax:  [phone]

## 2019-06-06 ENCOUNTER — Ambulatory Visit (INDEPENDENT_AMBULATORY_CARE_PROVIDER_SITE_OTHER): Payer: Managed Care, Other (non HMO) | Admitting: Family Medicine

## 2019-06-06 ENCOUNTER — Encounter: Payer: Self-pay | Admitting: Neurology

## 2019-06-06 ENCOUNTER — Ambulatory Visit: Payer: Managed Care, Other (non HMO) | Admitting: Family Medicine

## 2019-06-06 ENCOUNTER — Encounter: Payer: Self-pay | Admitting: Family Medicine

## 2019-06-06 ENCOUNTER — Ambulatory Visit (INDEPENDENT_AMBULATORY_CARE_PROVIDER_SITE_OTHER): Payer: Managed Care, Other (non HMO) | Admitting: Neurology

## 2019-06-06 VITALS — BP 138/82 | HR 83 | Temp 97.8°F | Wt 279.0 lb

## 2019-06-06 VITALS — BP 142/80 | HR 52 | Temp 97.7°F | Ht 72.0 in | Wt 278.2 lb

## 2019-06-06 DIAGNOSIS — R0683 Snoring: Secondary | ICD-10-CM | POA: Diagnosis not present

## 2019-06-06 DIAGNOSIS — Z8669 Personal history of other diseases of the nervous system and sense organs: Secondary | ICD-10-CM

## 2019-06-06 DIAGNOSIS — E1159 Type 2 diabetes mellitus with other circulatory complications: Secondary | ICD-10-CM

## 2019-06-06 DIAGNOSIS — I10 Essential (primary) hypertension: Secondary | ICD-10-CM

## 2019-06-06 DIAGNOSIS — E669 Obesity, unspecified: Secondary | ICD-10-CM

## 2019-06-06 DIAGNOSIS — G4719 Other hypersomnia: Secondary | ICD-10-CM | POA: Diagnosis not present

## 2019-06-06 DIAGNOSIS — I4891 Unspecified atrial fibrillation: Secondary | ICD-10-CM | POA: Diagnosis not present

## 2019-06-06 DIAGNOSIS — Z72820 Sleep deprivation: Secondary | ICD-10-CM

## 2019-06-06 DIAGNOSIS — I441 Atrioventricular block, second degree: Secondary | ICD-10-CM

## 2019-06-06 DIAGNOSIS — R42 Dizziness and giddiness: Secondary | ICD-10-CM

## 2019-06-06 DIAGNOSIS — Z8542 Personal history of malignant neoplasm of other parts of uterus: Secondary | ICD-10-CM | POA: Diagnosis not present

## 2019-06-06 DIAGNOSIS — I480 Paroxysmal atrial fibrillation: Secondary | ICD-10-CM

## 2019-06-06 NOTE — Patient Instructions (Addendum)
My Plan is to proceed with:  1) I would much prefer an attended sleep study for this patient with transient diastolic heart failure, weight gain, fluid retention.  2) shift work sleep disorder, sleep deprivation.  Needs to create a sleep pod.   3) narcolepsy ? No sleep paralysis, but irresistible urge to sleep, vivid dreams. MSLT ordered if patient can wean offf paxil.   4) RLS - Paxil induced?, DM induced ? Creepy crawly sensations-.  Weaning off on Paxil, going to 10 mg a day for one week, then every other day 10 mg for 10 days, then off. MSLT to follow after 14 days.   I would like to thank Denita Lung, MD and Dr Jaynee Eagles, MD  for allowing me to meet with and to take care of this pleasant patient.   In short, Laurie Mckinney is presenting with EDS, a symptom that can be attributed to atrial fib, OSA, hypoxia and sleep deprivation- shift work disorder  I plan to follow up either personally or through our NP within 2 months.    Electronically signed by: Larey Seat, MD 06/06/2019 9:22 AM  I believe you may have a condition called narcolepsy: This means, that you have a sleep disorder that manifests with at times severe excessive sleepiness during the day and often with problems with sleep at night. We may have to try different medications that may help you stay awake during the day.  Not everything works with everybody the same way. Wake promoting agents include stimulants and non-stimulant type medications.  The most common side effects with stimulants are weight loss, insomnia, nervousness, headaches, palpitations, rise in blood pressure, anxiety.  Stimulants can be addictive and subject to abuse. Non-stimulant type wake promoting medications include Provigil and Nuvigil, most common side effects include headaches, nervousness, insomnia, hypertension. In addition there is a medication called Xyrem which has been proven to be very effective in patients with  narcolepsy with or without cataplexy. Some patients with narcolepsy report episodes of weakness, such as jaw or facial weakness, legs giving out, feeling wobbly or like "Jell-o", etc. in situations of anxiety, stress, laughter, sudden sadness, surprise, etc., which is called cataplexy. You can also experience episodes of sleep paralysis during which you may feel unable to move upon awakening. Some people experience dreamlike sequences upon awakening or upon drifting off to sleep, called hypnopompic or hypnagogic hallucinations.    Hypersomnia Hypersomnia is a condition in which a person feels very tired during the day even though he or she gets plenty of sleep at night. A person with this condition may take naps during the day and may find it very difficult to wake up from sleep. Hypersomnia may affect a person's ability to think, concentrate, drive, or remember things. What are the causes? The cause of this condition may not be known. Possible causes include:  Certain medicines.  Sleep disorders, such as narcolepsy and sleep apnea.  Injury to the head, brain, or spinal cord.  Drug or alcohol use.  Gastroesophageal reflux disease (GERD).  Tumors.  Certain medical conditions, such as depression, diabetes, or an underactive thyroid gland (hypothyroidism). What are the signs or symptoms? The main symptoms of hypersomnia include:  Feeling very tired throughout the day, regardless of how much sleep you got the night before.  Having trouble waking up. Others may find it difficult to wake you up when you are sleeping.  Sleeping for longer and longer periods at a time.  Taking naps throughout the day. Other symptoms may include:  Feeling restless, anxious, or annoyed.  Lacking energy.  Having trouble with: ? Remembering. ? Speaking. ? Thinking.  Loss of appetite.  Seeing, hearing, tasting, smelling, or feeling things that are not real (hallucinations). How is this diagnosed?  This condition may be diagnosed based on:  Your symptoms and medical history.  Your sleeping habits. Your health care provider may ask you to write down your sleeping habits in a daily sleep log, along with any symptoms you have.  A series of tests that are done while you sleep (sleep study or polysomnogram).  A test that measures how quickly you can fall asleep during the day (daytime nap study or multiple sleep latency test). How is this treated? Treatment can help you manage your condition. Treatment may include:  Following a regular sleep routine.  Lifestyle changes, such as changing your eating habits, getting regular exercise, and avoiding alcohol or caffeinated beverages.  Taking medicines to make you more alert (stimulants) during the day.  Treating any underlying medical causes of hypersomnia. Follow these instructions at home: Sleep routine   Schedule the same bedtime and wake-up time each day.  Practice a relaxing bedtime routine. This may include reading, meditation, deep breathing, or taking a warm bath before going to sleep.  Get regular exercise each day. Avoid strenuous exercise in the evening hours.  Keep your sleep environment at a cooler temperature, darkened, and quiet.  Sleep with pillows and a mattress that are comfortable and supportive.  Schedule short 20-minute naps for when you feel sleepiest during the day.  Talk with your employer or teachers about your hypersomnia. If possible, adjust your schedule so that: ? You have a regular daytime work schedule. ? You can take a scheduled nap during the day. ? You do not have to work or be active at night.  Do not eat a heavy meal for a few hours before bedtime. Eat your meals at about the same times every day.  Avoid drinking alcohol or caffeinated beverages. Safety   Do not drive or use heavy machinery if you are sleepy. Ask your health care provider if it is safe for you to drive.  Wear a life  jacket when swimming or spending time near water. General instructions  Take supplements and over-the-counter and prescription medicines only as told by your health care provider.  Keep a sleep log that will help your doctor manage your condition. This may include information about: ? What time you go to bed each night. ? How often you wake up at night. ? How many hours you sleep at night. ? How often and for how long you nap during the day. ? Any observations from others, such as leg movements during sleep, sleep walking, or snoring.  Keep all follow-up visits as told by your health care provider. This is important. Contact a health care provider if:  You have new symptoms.  Your symptoms get worse. Get help right away if:  You have serious thoughts about hurting yourself or someone else. If you ever feel like you may hurt yourself or others, or have thoughts about taking your own life, get help right away. You can go to your nearest emergency department or call:  Your local emergency services (911 in the [place]  A suicide crisis helpline, such as the [place] at 573-712-5167. This is open 24 hours a day. Summary  Hypersomnia refers to a condition in which  you feel very tired during the day even though you get plenty of sleep at night.  A person with this condition may take naps during the day and may find it very difficult to wake up from sleep.  Hypersomnia may affect a person's ability to think, concentrate, drive, or remember things.  Treatment, such as following a regular sleep routine and making some lifestyle changes, can help you manage your condition. This information is not intended to replace advice given to you by your health care provider. Make sure you discuss any questions you have with your health care provider. Document Released: 09/11/2002 Document Revised: 09/23/2017 Document Reviewed: 09/23/2017 Elsevier Patient Education  2020  [employer].

## 2019-06-06 NOTE — Progress Notes (Signed)
   Subjective:    Patient ID: Laurie Mckinney, female    DOB: 1961-03-05, 58 y.o.   MRN: [id number]  HPI She is here for consult concerning continued difficulty with various issues.  She has had second-degree heart block as well as atrial fib.  She recently had a cardioversion on August 20 and states that she does feel better.  She has also been evaluated by neurology and today was seen for a sleep study.  She was given paperwork to cover her through August 27.  She states that she was never informed about this.  She is in the process of being weaned off of Paxil and will have a sleep study as well as M SLT.  She alludes to the fact that she is not ready to return to work.   Review of Systems     Objective:   Physical Exam Alert and in no distress otherwise not examined       Assessment & Plan:  Atrial fibrillation, unspecified type ([place])  Snoring  Disequilibrium  Second degree heart block I explained that we covered her through the 27th but any further time missed from work would need to be handled through neurology.  At this point it does not seem like the cardiac issue is of any major concern.  I did send a note to Dr. Brett Fairy to get her input into whether she needs to take more time off from work.  I explained to her that it would be a neurologic issue rather than anything that we should handle here in our office.  Over 25 minutes spent discussing all these issues with her.

## 2019-06-06 NOTE — Progress Notes (Signed)
SLEEP MEDICINE CLINIC    Provider:  Larey Seat, MD  Primary Care Physician:  Denita Lung, [name] [address]     Referring Provider: Denita Lung, [address] [place],  [place] [address]          Chief Complaint according to patient   Patient presents with:    . New Patient (Initial Visit)           HISTORY OF PRESENT ILLNESS:  Laurie Mckinney is a 58 y.o. year old  Caucasian female patient seen here face to face upon a referral by Dr. Jaynee Eagles. MD on 06/06/2019.  Chief concern according to patient :" excessive daytime sleepiness" - "coming home in AM I have run off the road, sleepy"   I have the pleasure of seeing Laurie Mckinney today, a right -handed Caucasian female with a possible sleep disorder.  She has gained over 35 pounds in the last 36 month. She is a night shift Insurance underwriter.  She  has a past medical history of Adjustment insomnia, Allergy, Asthma, Atrial flutter ([place]) (02/26/15), Bursitis, Diabetes mellitus without complication ([place]), Hypertension, Migraine headache, Mobitz type 2 second degree heart block, and Obesity.  She was diagnosed with DM less than one year ago. She was diagnosed with atrial fibrillation, during a surgery for cervical cancer- and was seen by Dr. Lind Guest.  Earlier last month she had balance problems, slurred speech and clumsy hands. Stroke work up negative. Started Eloquis. Had a cardioversion on 20- August 2020. She underwent sucessfull cardioversion at [place], [place].   The patient was diagnosed atrial fibrillation, and this may have let to fatigue, sleepiness?     The patient had the first sleep study in the year 2016, a HST -negative for apnea.   Sleep relevant medical history: Nocturia, no tonsillectomy.    Family medical /sleep history: one other family member with OSA- her son, who is 27 years old. .    Social history: Patient is working as Chief Strategy Officer at night, Psychologist, prison and probation services and lives in a household with 2 persons.  Family status is married , with 2 sone , 6 and 47 children, no grandchildren.  The patient currently works in shifts( Presenter, broadcasting) but out of work since July.  Tobacco use; never .  ETOH use: none,  Used to take caffeine tabs at work.  Caffeine intake in form of Coffee( 1 cup a day) Soda( one every couple of days) Tea ( none) or energy drinks. Regular exercise - none  .  Hobbies : gardening    Sleep habits are as follows: The patient's dinner time is between 7.30-9  PM.  She comes home at 6.30 AM-  The patient goes to bed at 7 AM and continues to sleep for 4 -6 hours. The dog wakes her-wakes for bathroom breaks, the first time at 11 AM.  She has a snack- 6  PM supper time- may have a cat nap 3.30-5 PM. The preferred sleep position is supine/ snores loudly-, with the support of 1 pillow. Dreams are reportedly rare. There is no usual rise time. The patient wakes up spontaneously with an alarm.  She reports not feeling refreshed or restored after sleep with symptoms such as dry mouth, thirsty, morning headaches, and residual fatigue.  Naps are taken frequently, lasting from 30 to 90 minutes.    Review of Systems: Out of a complete 14 system review, the patient complains  of only the following symptoms, and all other reviewed systems are negative.:  Fatigue, sleepiness , snoring, fragmented sleep,  Sleep deprivation- shift work    How likely are you to doze in the following situations: 0 = not likely, 1 = slight chance, 2 = moderate chance, 3 = high chance   Sitting and Reading? Watching Television? Sitting inactive in a public place (theater or meeting)? As a passenger in a car for an hour without a break? Lying down in the afternoon when circumstances permit? Sitting and talking to someone? Sitting quietly after lunch without alcohol? In a car, while stopped for a few minutes in traffic?   Total = 21/ 24 points   FSS endorsed at  53/ 63 points.   Social History   Socioeconomic History  . Marital status: Married    Spouse name: Not on file  . Number of children: Not on file  . Years of education: Not on file  . Highest education level: Not on file  Occupational History  . Not on file  Social Needs  . Financial resource strain: Not on file  . Food insecurity    Worry: Not on file    Inability: Not on file  . Transportation needs    Medical: Not on file    Non-medical: Not on file  Tobacco Use  . Smoking status: Never Smoker  . Smokeless tobacco: Never Used  Substance and Sexual Activity  . Alcohol use: No  . Drug use: No  . Sexual activity: Yes  Lifestyle  . Physical activity    Days per week: Not on file    Minutes per session: Not on file  . Stress: Not on file  Relationships  . Social Herbalist on phone: Not on file    Gets together: Not on file    Attends religious service: Not on file    Active member of club or organization: Not on file    Attends meetings of clubs or organizations: Not on file    Relationship status: Not on file  Other Topics Concern  . Not on file  Social History Narrative  . Not on file    Family History  Problem Relation Age of Onset  . Arthritis Mother   . Heart disease Father     Past Medical History:  Diagnosis Date  . Adjustment insomnia    SHIFT WORK  . Allergy    RHINITIS  . Asthma   . Atrial flutter [facility]) 02/26/15   [facility]  . Bursitis    right hip  . Diabetes mellitus without complication ([place])   . Hypertension   . Migraine headache   . Mobitz type 2 second degree heart block   . Obesity     Past Surgical History:  Procedure Laterality Date  . ABDOMINAL HYSTERECTOMY    . CARDIOVERSION N/A 05/25/2019   Procedure: CARDIOVERSION;  Surgeon: Arnoldo Lenis, MD;  Location: [facility];  Service: Endoscopy;  Laterality: N/A;  . COLONOSCOPY  2011   Dr.Brodie  . ROBOTIC ASSISTED TOTAL HYSTERECTOMY WITH BILATERAL  SALPINGO OOPHERECTOMY Bilateral 02/05/2015   Procedure: ROBOTIC ASSISTED TOTAL LAPAROSCOPIC HYSTERECTOMY WITH BILATERAL SALPINGO OOPHORECTOMY;  Surgeon: Everitt Amber, MD;  Location: [facility];  Service: Gynecology;  Laterality: Bilateral;     Current Outpatient Medications on File Prior to Visit  Medication Sig Dispense Refill  . acetaminophen (TYLENOL ARTHRITIS PAIN) 650 MG CR tablet Take 650 mg by mouth every 8 (eight)  hours as needed for pain.    Marland Kitchen albuterol (PROVENTIL HFA;VENTOLIN HFA) 108 (90 Base) MCG/ACT inhaler TAKE 2 PUFFS BY MOUTH EVERY 6 HOURS AS NEEDED FOR WHEEZE OR SHORTNESS OF BREATH 6.7 Inhaler 0  . apixaban (ELIQUIS) 5 MG TABS tablet Take 1 tablet (5 mg total) by mouth 2 (two) times daily. 60 tablet 2  . buPROPion (WELLBUTRIN SR) 100 MG 12 hr tablet TAKE 1 TABLET DAILY 90 tablet 3  . lisinopril-hydrochlorothiazide (ZESTORETIC) 10-12.5 MG tablet Take 1 tablet by mouth daily. 30 tablet 0  . Multiple Vitamins-Minerals (MULTIVITAMIN WITH MINERALS) tablet Take 1 tablet by mouth every morning.     Marland Kitchen PARoxetine (PAXIL) 20 MG tablet Take 1 tablet (20 mg total) by mouth daily. 90 tablet 3  . meclizine (ANTIVERT) 25 MG tablet Take 1 tablet (25 mg total) by mouth 3 (three) times daily as needed for dizziness. (Patient not taking: Reported on 06/06/2019) 20 tablet 1  . oxybutynin (DITROPAN XL) 10 MG 24 hr tablet Take 1 tablet (10 mg total) by mouth at bedtime. (Patient not taking: Reported on 06/06/2019) 90 tablet 3   No current facility-administered medications on file prior to visit.     No Known Allergies  Physical exam:  Today's Vitals   06/06/19 0854  BP: (!) 142/80  Pulse: (!) 52  Temp: 97.7 F (36.5 C)  SpO2: 97%  Weight: 278 lb 3.2 oz (126.2 kg)  Height: 6' (1.829 m)   Body mass index is 37.73 kg/m.   Wt Readings from Last 3 Encounters:  06/06/19 278 lb 3.2 oz (126.2 kg)  05/23/19 275 lb (124.7 kg)  05/15/19 277 lb 6.4 oz (125.8 kg)     Ht Readings from Last 3 Encounters:   06/06/19 6' (1.829 m)  05/23/19 6' (1.829 m)  05/15/19 6' (1.829 m)      General: The patient is awake, alert and appears not in acute distress. The patient is well groomed. Head: Normocephalic, atraumatic. Neck is supple. Mallampati 4 ,  neck circumference: 17 inches . Nasal airflow patent.  Retrognathia is not seen.  Dental status:  Intact. Cardiovascular:  Regular rate and cardiac rhythm by pulse, without distended neck veins. Respiratory: Lungs are clear to auscultation.  Skin:  Without evidence of ankle edema, or rash. Trunk: The patient's posture is erect.   Neurologic exam : The patient is awake and alert, oriented to place and time.   Memory subjective described as intact.  Attention span & concentration ability appears normal.  Speech is fluent,  without  dysarthria, dysphonia or aphasia.  Mood and affect are appropriate.   Cranial nerves: no loss of smell or taste reported  Pupils are equal and briskly reactive to light. Funduscopic exam deferred. .  Extraocular movements in vertical and horizontal planes were intact and without nystagmus. No Diplopia. Visual fields by finger perimetry are intact. Hearing was impaired- hearing aids in place.   Facial sensation intact to fine touch.  Facial motor strength is symmetric and tongue and uvula move midline.  Neck ROM : rotation, tilt and flexion extension were normal for age and shoulder shrug was symmetrical.    Motor exam:  Symmetric bulk, tone and ROM.   Normal tone without cog wheeling, symmetric grip strength . Pronator drift noted on the right.   Sensory:  Fine touch, pinprick and vibration were tested  and  normal.  Proprioception tested in the upper extremities was normal.   Coordination: Rapid alternating movements in the fingers/hands were of  normal speed.  The Finger-to-nose maneuver was intact without evidence of ataxia, but mild dysmetria on the dominant right side.    Gait and station: Patient could rise  unassisted from a seated position, walked without assistive device.  Stance is of normal width/ base and the patient turned with 4 steps.  Toe and heel walk were deferred.  Deep tendon reflexes: in the  upper and lower extremities are symmetric and intact.  Babinski response was deferred.       After spending a total time of  45 minutes face to face and additional time for physical and neurologic examination, review of laboratory studies,  personal review of imaging studies, reports and results of other testing and review of referral information / records as far as provided in visit, I have established the following assessments:  1)  Atrial fibrillation and cardioversion on 05-25-2019, but remaining excessively daytime sleepy, high degree of fatigue.  2) cumulatively sleep deprived, daytime sleep is interrupted by sounds, pets, and her 43 some year old mother's  needs.  Shift work sleep disorder. unsave to drive- needs modafinil when she returns to work.   3) obesity and shortness of breath= snoring is witnessed. OSA likely . 4) cognitive impairment - she feels unsafe to drive, clumsy and unable to rapidly interpret the work related  data.     My Plan is to proceed with:  1) I would much prefer an attended sleep study for this patient with transient diastolic heart failure, weight gain, fluid retention.  2) shift work sleep disorder, sleep deprivation.  Needs to create a sleep pod.   3) narcolepsy ? No sleep paralysis, but irresistible urge to sleep, vivid dreams. MSLT ordered if patient can wean offf paxil.   4) RLS - Paxil induced, DM induced.  Weaning off on Paxil, going to 10 mg a day for one week, then every other day 10 mg for 10 days, then off. MSLT to follow after 14 days.   I would like to thank Denita Lung, MD and Dr Jaynee Eagles, MD  for allowing me to meet with and to take care of this pleasant patient.   In short, Laurie Mckinney is presenting with EDS, a symptom that can be  attributed to atrial fib, OSA, hypoxia and sleep deprivation- shift work disorder  I plan to follow up either personally or through our NP within 2 months.    Electronically signed by: Larey Seat, MD 06/06/2019 9:22 AM  [facility] Board certified by The [employer] of Sleep Medicine and Diplomate of the [employer] of Sleep Medicine. Board certified In Neurology through the [place], Fellow of the [employer] of Neurology. Medical Director of [employer].

## 2019-06-07 ENCOUNTER — Other Ambulatory Visit: Payer: Self-pay

## 2019-06-07 ENCOUNTER — Encounter: Payer: Self-pay | Admitting: Family Medicine

## 2019-06-07 ENCOUNTER — Ambulatory Visit (INDEPENDENT_AMBULATORY_CARE_PROVIDER_SITE_OTHER): Payer: Managed Care, Other (non HMO)

## 2019-06-07 DIAGNOSIS — I4819 Other persistent atrial fibrillation: Secondary | ICD-10-CM

## 2019-06-07 NOTE — Addendum Note (Signed)
Addended by: Merlene Laughter on: 06/07/2019 10:37 AM   Modules accepted: Orders

## 2019-06-08 ENCOUNTER — Encounter: Payer: Self-pay | Admitting: Neurology

## 2019-06-08 ENCOUNTER — Encounter (HOSPITAL_COMMUNITY): Payer: Self-pay

## 2019-06-08 ENCOUNTER — Ambulatory Visit (HOSPITAL_COMMUNITY): Payer: Managed Care, Other (non HMO) | Attending: Neurology

## 2019-06-08 DIAGNOSIS — R42 Dizziness and giddiness: Secondary | ICD-10-CM | POA: Insufficient documentation

## 2019-06-08 DIAGNOSIS — R29818 Other symptoms and signs involving the nervous system: Secondary | ICD-10-CM

## 2019-06-08 DIAGNOSIS — R2681 Unsteadiness on feet: Secondary | ICD-10-CM | POA: Diagnosis present

## 2019-06-08 DIAGNOSIS — R296 Repeated falls: Secondary | ICD-10-CM | POA: Insufficient documentation

## 2019-06-08 NOTE — Therapy (Addendum)
[place], [name], [address] Phone: [phone]   Fax:  [phone]  Physical Therapy Treatment & Progress Note  Patient Details  Name: Laurie Mckinney MRN: [id number] Date of Birth: 12/29/1960 Referring Provider (PT): Sarina Ill    Encounter Date: 06/08/2019   Progress Note Reporting Period 05/11/19 to 06/08/19  See note below for Objective Data and Assessment of Progress/Goals.    I have read and reviewed the following objective tests and measures performed by Ihor Austin, PTA. I agree with continuation of skilled PT services to further progress strength, balance, gait, activity tolerance, coordination, and overall performance of functional activities to return to PLOF and improve overall QoL.  Talbot Grumbling PT, DPT 06/13/19, 10:34 AM [phone]     PT End of Session - 06/08/19 0959    Visit Number  5    Number of Visits  17    Date for PT Re-Evaluation  07/06/19   Minireassess complete 06/08/19; visit #5   Authorization Type  [employer] Managed (60 visits)    Authorization Time Period  05/11/19 to 07/06/19    Authorization - Visit Number  5    Authorization - Number of Visits  10    PT Start Time  0916    PT Stop Time  0959    PT Time Calculation (min)  43 min    Equipment Utilized During Treatment  Gait belt    Activity Tolerance  Patient tolerated treatment well    Behavior During Therapy  WFL for tasks assessed/performed       Past Medical History:  Diagnosis Date  . Adjustment insomnia    SHIFT WORK  . Allergy    RHINITIS  . Asthma   . Atrial flutter [facility]) 02/26/15   [facility]   . Bursitis    right hip  . Diabetes mellitus without complication ([place])   . Hypertension   . Migraine headache   . Mobitz type 2 second degree heart block   . Obesity     Past Surgical History:  Procedure Laterality Date  . ABDOMINAL HYSTERECTOMY    . CARDIOVERSION N/A 05/25/2019    Procedure: CARDIOVERSION;  Surgeon: Arnoldo Lenis, MD;  Location: [facility];  Service: Endoscopy;  Laterality: N/A;  . COLONOSCOPY  2011   Dr.Brodie  . ROBOTIC ASSISTED TOTAL HYSTERECTOMY WITH BILATERAL SALPINGO OOPHERECTOMY Bilateral 02/05/2015   Procedure: ROBOTIC ASSISTED TOTAL LAPAROSCOPIC HYSTERECTOMY WITH BILATERAL SALPINGO OOPHORECTOMY;  Surgeon: Everitt Amber, MD;  Location: [facility];  Service: Gynecology;  Laterality: Bilateral;    There were no vitals filed for this visit.  Subjective Assessment - 06/08/19 0922    Subjective  Pt still feels wobby, no pain, no dizziness or recent fall.  Went to PCP yesterday.  Had consult for sleep study, had to elimated Paxil medication prior sleep study schedule around a month    Patient Stated Goals  improve coordination, be able to raise up from commode    Currently in Pain?  No/denies         [facility] PT Assessment - 06/08/19 0001      Assessment   Medical Diagnosis  ataxia     Referring Provider (PT)  Sarina Ill     Onset Date/Surgical Date  --   July 2020   Next MD Visit  unsure     Prior Therapy  years ago for my anlke       Precautions   Precautions  Fall      Strength   Right Hip Extension  4/5   was 3/5   Left Hip Extension  4-/5   was 3/5   Right Knee Flexion  4+/5   was 4/5   Left Knee Flexion  4+/5   was 4/5     Dynamic Gait Index   Level Surface  Mild Impairment    Change in Gait Speed  Mild Impairment    Gait with Horizontal Head Turns  Moderate Impairment    Gait with Vertical Head Turns  Mild Impairment    Gait and Pivot Turn  Mild Impairment    Step Over Obstacle  Mild Impairment    Step Around Obstacles  Mild Impairment    Steps  Moderate Impairment    Total Score  14    DGI comment:  was 10/24 initial eval            [facility] Adult PT Treatment/Exercise - 06/08/19 0001      Knee/Hip Exercises: Standing   Heel Raises  Both;20 reps    Heel Raises Limitations  toe raises, 15 reps slope    Functional  Squat  2 sets;5 reps    Functional Squat Limitations  cueing for mechanics infront of chair      Knee/Hip Exercises: Seated   Sit to Sand  2 sets;5 reps;without UE support   5STS 10.6" no HHA         Balance Exercises - 06/08/19 0942      Balance Exercises: Standing   Tandem Stance  Eyes open;Foam/compliant surface;30 secs;3 reps    Balance Beam  tandem and sidestep    Retro Gait  2 reps    Sidestepping  2 reps   GTB   Cone Rotation Limitations  tandem stance with BUE flexion, 2# bar, x10 reps each LE back; tandem stance with paloff press, 2# bar, x10 reps each LE back    Marching Limitations  heel walking, x1RT blue line    Other Standing Exercises  flat foot taps to cones at midline, x10 reps each LE          PT Short Term Goals - 06/08/19 2902      PT SHORT TERM GOAL #1   Title  Patient to be compliant with correct performance of HEP, to be updated PRN    Baseline  06/08/19:  Reports compliance wiht HEP 2-3 times daily    Status  Achieved      PT SHORT TERM GOAL #2   Title  Patient to show glute and hamstring strength of at least 4+/5 in order to assist with functional tasks such as getting up from low seats and low commode    Baseline  06/08/19: see MMT    Status  Partially Met      PT SHORT TERM GOAL #3   Title  Patient to score at least 14/24 on DGI in order to show improved mobilty and reduced fall risk    Baseline  06/08/19: DGI 14/24 (was 10/24 initial eval)    Status  Achieved      PT SHORT TERM GOAL #4   Title  Patient to show improved gait pattern with reduction of wide based stance in order to show improved mobility and balance    Status  On-going        PT Long Term Goals - 06/08/19 1306      PT LONG TERM GOAL #1   Title  Patient to score  at least 18/24 on DGI in order to show reduced fall risk and improved functional balance skills    Baseline  06/08/19: DGI 14/24 (was 10/24 initial eval)      PT LONG TERM GOAL #2   Title  Patient to report no falls  within the past 4 weeks in order to show improved functional balance skills and reduced risk of fall-related injury    Baseline  06/08/19: No reports of falls, continues to feel unsteady and lightheadedness    Status  On-going      PT LONG TERM GOAL #3   Title  Patient to report resolution of dizziness and vertigo in order to improve QOL and assist in fall prevention    Baseline  06/08/19: No reports of falls, continues to feel unsteady and lightheadedness    Status  On-going      PT LONG TERM GOAL #4   Title  Patient to be able to state at least 3 concepts of energy conservation in order to show improved self-regulation skills and prevent patient from over-fatiguing throughout the day            Plan - 06/08/19 1305    Clinical Impression Statement  Reviewed goals and continued with established POC for LE strenghtening and balance training.  Objective testing included MMT and DGI complete this session.  Pt has met 2/4 STGs and progressing towards LTGs.  Reports compliance with HEP 3-4 times a week  No reports of recent fall, does continues to c/o feeling unsteady during gait and constant lightheadedness.  Strength has improved with hamstrings, continues to demonstrate weakness wiht glut max.  Added squats to POC to address gluteal weakness wiht cueing for mechanics and chair behind for safety.  EOS no reports of increased pain, was limited by fatiuge.    Personal Factors and Comorbidities  Fitness;Comorbidity 2;Social Background;Education;Time since onset of injury/illness/exacerbation    Comorbidities  obesity, A-fib    Examination-Activity Limitations  Transfers;Locomotion Level;Bed Mobility;Reach Overhead;Squat;Stairs;Stand    Examination-Participation Restrictions  Church;Yard Work;Cleaning;Community Activity;Interpersonal Relationship;Volunteer    Stability/Clinical Decision Making  Unstable/Unpredictable    Clinical Decision Making  High    Rehab Potential  Good    PT Frequency  2x /  week    PT Duration  8 weeks    PT Treatment/Interventions  ADLs/Self Care Home Management;Canalith Repostioning;DME Instruction;Gait training;Stair training;Functional mobility training;Therapeutic activities;Therapeutic exercise;Balance training;Patient/family education;Neuromuscular re-education;Manual techniques;Vestibular;Taping;Energy conservation    PT Next Visit Plan  Progress balance/coordination, hip extensor and hamstring strength. Challenge coordination vs. attention for activities. Consider body weight support TM.    PT Home Exercise Plan  Eval: bridges, narrow BOS, partial tandem stance; 8/24: calf raises with UE support, STS without UE assist; 8/27: sidestepping and tandem stance with UE support       Patient will benefit from skilled therapeutic intervention in order to improve the following deficits and impairments:  Abnormal gait, Decreased coordination, Difficulty walking, Decreased safety awareness, Dizziness, Obesity, Decreased activity tolerance, Impaired vision/preception, Decreased balance, Decreased mobility, Decreased strength  Visit Diagnosis: Other symptoms and signs involving the nervous system  Unsteadiness on feet  Repeated falls  Dizziness and giddiness     Problem List Patient Active Problem List   Diagnosis Date Noted  . Snoring 04/21/2019  . Varicose veins of lower extremity 04/14/2019  . Cough 04/14/2019  . Atrial fibrillation ([place]) 04/14/2019  . DOE (dyspnea on exertion) 04/14/2019  . Disequilibrium 04/13/2019  . Ataxia 04/13/2019  . Hearing loss 04/13/2019  .  Aphasia 04/13/2019  . Type 2 diabetes mellitus without complication, without long-term current use of insulin ([place]) 09/29/2018  . Second degree heart block 05/05/2018  . Urinary incontinence 05/05/2018  . Chronic low back pain without sciatica 05/05/2018  . Hypertension associated with diabetes ([place]) 09/20/2017  . Actinic keratosis 02/24/2017  . Dysthymia 01/10/2016  . History of  endometrial cancer 02/05/2015    Class: Stage 1  . History of migraine headaches 03/18/2011  . Allergic rhinitis due to pollen 03/18/2011  . Obesity (BMI 30-39.9) 03/18/2011   Ihor Austin, LPTA; CBIS [phone]  Aldona Lento 06/08/2019, 1:13 PM  [place], [name], [address] Phone: [phone]   Fax:  [phone]  Name: ALANYA VUKELICH MRN: [id number] Date of Birth: 07/06/1961

## 2019-06-09 ENCOUNTER — Telehealth: Payer: Self-pay | Admitting: Neurology

## 2019-06-09 ENCOUNTER — Other Ambulatory Visit: Payer: Self-pay | Admitting: Neurology

## 2019-06-09 ENCOUNTER — Encounter: Payer: Self-pay | Admitting: Neurology

## 2019-06-09 ENCOUNTER — Encounter: Payer: Self-pay | Admitting: Family Medicine

## 2019-06-09 NOTE — Telephone Encounter (Signed)
Hello , [name];  I will sign you out until day of sleep test and 3 days beyond that for results to be available.  I will copy this info to my nurse and secretarial staff- our office is closed on Fridays and this is a holiday weekend.  Date of sleep test is when- X September  2020?  Preliminary 07-02-2019 return date is OK with you?   CD  ===View-only below this line===   ----- Message -----      From:Laurie Mckinney      Sent:06/08/2019  5:52 PM EDT        ZR:[id number] Laurie Klinke, MD   Subject:Visit Follow-Up Question  Dr Laurie Mckinney, I saw Dr Laurie Mckinney on Sept 1st. He said he would reach out to you about extending my back to work date. I have the sleep study scheduled, etc... I work and drive to [place] from [place]. Have you made a decision to extend my date? Or will you sign off on me to go back to work?  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 Laurie Mckinney

## 2019-06-10 NOTE — Telephone Encounter (Signed)
Hi Dr Brett Fairy,,  Your office make the appointment with me for October 5 and Oct 6, so I can get the antidepressants out of my system. What you suggested sounds good. I may need that Shift Work Disorder pill. Thank you.  Laurie Mckinney   Previous Messages      Visit Follow-Up Question  [name], [name]  You Yesterday (12:31 PM)      Hi Dr Brett Fairy,, Your office make the appointment with me for October 5 and Oct 6, so I can get the antidepressants out of my system. What you suggested sounds good. I may need that Shift Work Disorder pill. Thank you.  Laurie Mckinney

## 2019-06-13 ENCOUNTER — Encounter: Payer: Self-pay | Admitting: Neurology

## 2019-06-14 ENCOUNTER — Telehealth: Payer: Self-pay | Admitting: Family Medicine

## 2019-06-14 ENCOUNTER — Telehealth: Payer: Self-pay | Admitting: *Deleted

## 2019-06-14 ENCOUNTER — Encounter: Payer: Self-pay | Admitting: Neurology

## 2019-06-14 NOTE — Telephone Encounter (Signed)
Pt called and wanted to get a prescription to get her Pneumonia shot done at the [facility] in [place] on [facility]. Pt stated that she has an appt there tomorrow to get her flu shot and wanted to get her Pneumonia shot as well.

## 2019-06-14 NOTE — Telephone Encounter (Signed)
Think that she needs a prescription for it but if so I will write it

## 2019-06-14 NOTE — Telephone Encounter (Signed)
Completed a work note with pending to return work date of 07/17/19. Waiting for Dr Brett Fairy to sign and will send to patient via mychart and mail.

## 2019-06-14 NOTE — Telephone Encounter (Signed)
Pt aware EKG appt scheduled

## 2019-06-14 NOTE — Telephone Encounter (Signed)
-----   Message from Arnoldo Lenis, MD sent at 06/13/2019 11:37 AM EDT ----- Echo looks good, she needs a nursing next week for ekg since she had her cardioversion   Zandra Abts MD

## 2019-06-15 ENCOUNTER — Ambulatory Visit (HOSPITAL_COMMUNITY): Payer: Managed Care, Other (non HMO)

## 2019-06-15 ENCOUNTER — Encounter (HOSPITAL_COMMUNITY): Payer: Self-pay

## 2019-06-15 ENCOUNTER — Other Ambulatory Visit: Payer: Self-pay

## 2019-06-15 DIAGNOSIS — R2681 Unsteadiness on feet: Secondary | ICD-10-CM

## 2019-06-15 DIAGNOSIS — R29818 Other symptoms and signs involving the nervous system: Secondary | ICD-10-CM | POA: Diagnosis not present

## 2019-06-15 DIAGNOSIS — R296 Repeated falls: Secondary | ICD-10-CM

## 2019-06-15 DIAGNOSIS — R42 Dizziness and giddiness: Secondary | ICD-10-CM

## 2019-06-15 NOTE — Telephone Encounter (Signed)
Pt has had flu and pne vaccine today at [facility]

## 2019-06-15 NOTE — Therapy (Signed)
[place], [name], [address] Phone: [phone]   Fax:  [phone]  Physical Therapy Treatment  Patient Details  Name: Laurie Mckinney MRN: [id number] Date of Birth: [birth year] Referring Provider (PT): Sarina Ill    Encounter Date: 06/15/2019  PT End of Session - 06/15/19 0933    Visit Number  6    Number of Visits  17    Date for PT Re-Evaluation  07/06/19   Minireassess complete 06/08/19; visit #5   Authorization Type  [employer] Managed (60 visits)    Authorization Time Period  05/11/19 to 07/06/19    Authorization - Visit Number  2    Authorization - Number of Visits  10    PT Start Time  0820    PT Stop Time  0900    PT Time Calculation (min)  40 min    Equipment Utilized During Treatment  Gait belt    Activity Tolerance  Patient tolerated treatment well    Behavior During Therapy  [facility] for tasks assessed/performed       Past Medical History:  Diagnosis Date  . Adjustment insomnia    SHIFT WORK  . Allergy    RHINITIS  . Asthma   . Atrial flutter [facility]) 02/26/15   [facility] [place]  . Bursitis    right hip  . Diabetes mellitus without complication ([place])   . Hypertension   . Migraine headache   . Mobitz type 2 second degree heart block   . Obesity     Past Surgical History:  Procedure Laterality Date  . ABDOMINAL HYSTERECTOMY    . CARDIOVERSION N/A 05/25/2019   Procedure: CARDIOVERSION;  Surgeon: Arnoldo Lenis, MD;  Location: [facility];  Service: Endoscopy;  Laterality: N/A;  . COLONOSCOPY  2011   Dr.Brodie  . ROBOTIC ASSISTED TOTAL HYSTERECTOMY WITH BILATERAL SALPINGO OOPHERECTOMY Bilateral 02/05/2015   Procedure: ROBOTIC ASSISTED TOTAL LAPAROSCOPIC HYSTERECTOMY WITH BILATERAL SALPINGO OOPHORECTOMY;  Surgeon: Everitt Amber, MD;  Location: [facility];  Service: Gynecology;  Laterality: Bilateral;    There were no vitals filed for this visit.  Subjective Assessment - 06/15/19 0824    Subjective  Pt reports sleep study will take place in October and Narcolepsy test in October as well. Pt reports her PCP, cardiologist and neurologist are assessing her for work clearance in the coming days. Pt reports fall last Tuesday while carrying bags, miss judged height of step and fell, neightbor assisted her.    Patient Stated Goals  improve coordination, be able to raise up from commode    Currently in Pain?  No/denies          [facility] Adult PT Treatment/Exercise - 06/15/19 0001      Therapeutic Activites    Therapeutic Activities  Other Therapeutic Activities    Other Therapeutic Activities  Bending/squatting to low shelf to retrieve objects, standing and placing objects above head on shelf, then returning again to lower shelf, focusing on coordination, following sequencing, and maintaining balance with positional and gaze changes to simulate kitchen/bathroom and overall house navigation      Knee/Hip Exercises: Standing   Forward Step Up  Both;10 reps    Forward Step Up Limitations  opposite knee drive for balance, intermittent UE assist    Other Standing Knee Exercises  Vector stance, 3 sec hold, BLE x10 reps          Balance Exercises - 06/15/19 0935      Balance  Exercises: Standing   Heel Raises Limitations  Warrior I, II and III, 4 reps, 10 sec hold both directions;    Toe Raise Limitations  weighted walking retro/forward, 50#, x10 reps; weighted lateral stepping, 30# x10 reps each direction        PT Education - 06/15/19 0933    Education Details  Exercise technique, updated HEP, attention to task with activites at home and decreasing speed to improve balance and performance    Person(s) Educated  Patient    Methods  Explanation;Demonstration;Handout    Comprehension  Verbalized understanding;Returned demonstration       PT Short Term Goals - 06/08/19 0928      PT SHORT TERM GOAL #1   Title  Patient to be compliant with correct performance of HEP, to be  updated PRN    Baseline  06/08/19:  Reports compliance wiht HEP 2-3 times daily    Status  Achieved      PT SHORT TERM GOAL #2   Title  Patient to show glute and hamstring strength of at least 4+/5 in order to assist with functional tasks such as getting up from low seats and low commode    Baseline  06/08/19: see MMT    Status  Partially Met      PT SHORT TERM GOAL #3   Title  Patient to score at least 14/24 on DGI in order to show improved mobilty and reduced fall risk    Baseline  06/08/19: DGI 14/24 (was 10/24 initial eval)    Status  Achieved      PT SHORT TERM GOAL #4   Title  Patient to show improved gait pattern with reduction of wide based stance in order to show improved mobility and balance    Status  On-going        PT Long Term Goals - 06/08/19 1306      PT LONG TERM GOAL #1   Title  Patient to score at least 18/24 on DGI in order to show reduced fall risk and improved functional balance skills    Baseline  06/08/19: DGI 14/24 (was 10/24 initial eval)      PT LONG TERM GOAL #2   Title  Patient to report no falls within the past 4 weeks in order to show improved functional balance skills and reduced risk of fall-related injury    Baseline  06/08/19: No reports of falls, continues to feel unsteady and lightheadedness    Status  On-going      PT LONG TERM GOAL #3   Title  Patient to report resolution of dizziness and vertigo in order to improve QOL and assist in fall prevention    Baseline  06/08/19: No reports of falls, continues to feel unsteady and lightheadedness    Status  On-going      PT LONG TERM GOAL #4   Title  Patient to be able to state at least 3 concepts of energy conservation in order to show improved self-regulation skills and prevent patient from over-fatiguing throughout the day            Plan - 06/15/19 0901    Clinical Impression Statement  Focused on standing balance and strength with exercises this date. Added functional tasks such as managing  cabinet spaces, coordination hand-eye movement to simulate household tasks and safety with them. Pt with moderate difficulty performing increased challenge exercises requiring verbal cues for gaze up, decreasing speed to improve motor control and therapeutic rest breaks to  recover. Attempted Warrior III, but performed in modified position clearing each floor 1-2 inches from floor with 2-5sec hold and moderate unsteadiness. Added lateral stepping with weight with conversation for dual task to challenge attention to task, coordination and balance with increased balance challenge when conversing while performing activities. Pt with increased concentration and used verbal repeating of cues for functional tasks of bending/squatting to low shelf to retrieve object then bringing it to top shelf, challenging balance and attention to task, but without near falls or missing sequencing. Pt with improved gait balance when navigating clinic, no veering noted, taking appropriately safe pathways, no contact with objects accidentally for reaching for furniture noted. Continue to progress as able.    Personal Factors and Comorbidities  Fitness;Comorbidity 2;Social Background;Education;Time since onset of injury/illness/exacerbation    Comorbidities  obesity, A-fib    Examination-Activity Limitations  Transfers;Locomotion Level;Bed Mobility;Reach Overhead;Squat;Stairs;Stand    Examination-Participation Restrictions  Church;Yard Work;Cleaning;Community Activity;Interpersonal Relationship;Volunteer    Stability/Clinical Decision Making  Unstable/Unpredictable    Rehab Potential  Good    PT Frequency  2x / week    PT Duration  8 weeks    PT Treatment/Interventions  ADLs/Self Care Home Management;Canalith Repostioning;DME Instruction;Gait training;Stair training;Functional mobility training;Therapeutic activities;Therapeutic exercise;Balance training;Patient/family education;Neuromuscular re-education;Manual  techniques;Vestibular;Taping;Energy conservation    PT Next Visit Plan  Progress balance/coordination, hip extensor and hamstring strength. Challenge coordination vs. attention for activities. Consider body weight support TM.    PT Home Exercise Plan  Eval: bridges, narrow BOS, partial tandem stance; 8/24: calf raises with UE support, STS without UE assist; 8/27: sidestepping and tandem stance with UE support; 9/10: vector stance forward/lateral/back with 5 sec hold    Consulted and Agree with Plan of Care  Patient       Patient will benefit from skilled therapeutic intervention in order to improve the following deficits and impairments:  Abnormal gait, Decreased coordination, Difficulty walking, Decreased safety awareness, Dizziness, Obesity, Decreased activity tolerance, Impaired vision/preception, Decreased balance, Decreased mobility, Decreased strength  Visit Diagnosis: Other symptoms and signs involving the nervous system  Unsteadiness on feet  Repeated falls  Dizziness and giddiness     Problem List Patient Active Problem List   Diagnosis Date Noted  . Snoring 04/21/2019  . Varicose veins of lower extremity 04/14/2019  . Cough 04/14/2019  . Atrial fibrillation ([place]) 04/14/2019  . DOE (dyspnea on exertion) 04/14/2019  . Disequilibrium 04/13/2019  . Ataxia 04/13/2019  . Hearing loss 04/13/2019  . Aphasia 04/13/2019  . Type 2 diabetes mellitus without complication, without long-term current use of insulin ([place]) 09/29/2018  . Second degree heart block 05/05/2018  . Urinary incontinence 05/05/2018  . Chronic low back pain without sciatica 05/05/2018  . Hypertension associated with diabetes ([place]) 09/20/2017  . Actinic keratosis 02/24/2017  . Dysthymia 01/10/2016  . History of endometrial cancer 02/05/2015    Class: Stage 1  . History of migraine headaches 03/18/2011  . Allergic rhinitis due to pollen 03/18/2011  . Obesity (BMI 30-39.9) 03/18/2011      Talbot Grumbling  PT, DPT 06/15/19, 9:39 AM [place] [address] [place], [name], [address] Phone: [phone]   Fax:  [phone]  Name: MAHITHA HICKLING MRN: [id number] Date of Birth: March 12, 1961

## 2019-06-19 ENCOUNTER — Ambulatory Visit (HOSPITAL_COMMUNITY): Payer: Managed Care, Other (non HMO)

## 2019-06-19 ENCOUNTER — Telehealth (HOSPITAL_COMMUNITY): Payer: Self-pay | Admitting: Family Medicine

## 2019-06-19 NOTE — Telephone Encounter (Signed)
06/19/19  husband left a message at 7:42 am to cx... no reason was given

## 2019-06-20 ENCOUNTER — Other Ambulatory Visit: Payer: Self-pay

## 2019-06-20 ENCOUNTER — Ambulatory Visit (INDEPENDENT_AMBULATORY_CARE_PROVIDER_SITE_OTHER): Payer: Managed Care, Other (non HMO) | Admitting: *Deleted

## 2019-06-20 DIAGNOSIS — I4819 Other persistent atrial fibrillation: Secondary | ICD-10-CM | POA: Diagnosis not present

## 2019-06-20 NOTE — Progress Notes (Signed)
Presents for EKG after DCCV. Denies sob or chest pain. Reports being a little dizzy.

## 2019-06-22 ENCOUNTER — Ambulatory Visit (HOSPITAL_COMMUNITY): Payer: Managed Care, Other (non HMO)

## 2019-06-22 ENCOUNTER — Other Ambulatory Visit: Payer: Self-pay

## 2019-06-22 ENCOUNTER — Encounter (HOSPITAL_COMMUNITY): Payer: Self-pay

## 2019-06-22 DIAGNOSIS — R2681 Unsteadiness on feet: Secondary | ICD-10-CM

## 2019-06-22 DIAGNOSIS — R29818 Other symptoms and signs involving the nervous system: Secondary | ICD-10-CM

## 2019-06-22 DIAGNOSIS — R42 Dizziness and giddiness: Secondary | ICD-10-CM

## 2019-06-22 DIAGNOSIS — R296 Repeated falls: Secondary | ICD-10-CM

## 2019-06-22 NOTE — Therapy (Signed)
[name], [name], [address] Phone: [phone]   Fax:  [phone]  Physical Therapy Treatment  Patient Details  Name: Laurie Mckinney MRN: [id number] Date of Birth: 10-05-1961 Referring Provider (PT): Sarina Ill    Encounter Date: 06/22/2019  PT End of Session - 06/22/19 0853    Visit Number  7    Number of Visits  17    Date for PT Re-Evaluation  07/06/19   Minireassess complete 06/08/19; visit #5   Authorization Type  [employer] Managed (60 visits)    Authorization Time Period  05/11/19 to 07/06/19    Authorization - Visit Number  3    Authorization - Number of Visits  10    PT Start Time  0820    PT Stop Time  0848    PT Time Calculation (min)  28 min    Activity Tolerance  Patient tolerated treatment well    Behavior During Therapy  [facility] for tasks assessed/performed       Past Medical History:  Diagnosis Date  . Adjustment insomnia    SHIFT WORK  . Allergy    RHINITIS  . Asthma   . Atrial flutter [facility]) 02/26/15   [facility] [place]  . Bursitis    right hip  . Diabetes mellitus without complication ([place])   . Hypertension   . Migraine headache   . Mobitz type 2 second degree heart block   . Obesity     Past Surgical History:  Procedure Laterality Date  . ABDOMINAL HYSTERECTOMY    . CARDIOVERSION N/A 05/25/2019   Procedure: CARDIOVERSION;  Surgeon: Arnoldo Lenis, MD;  Location: [facility];  Service: Endoscopy;  Laterality: N/A;  . COLONOSCOPY  2011   Dr.Brodie  . ROBOTIC ASSISTED TOTAL HYSTERECTOMY WITH BILATERAL SALPINGO OOPHERECTOMY Bilateral 02/05/2015   Procedure: ROBOTIC ASSISTED TOTAL LAPAROSCOPIC HYSTERECTOMY WITH BILATERAL SALPINGO OOPHORECTOMY;  Surgeon: Everitt Amber, MD;  Location: [facility];  Service: Gynecology;  Laterality: Bilateral;    There were no vitals filed for this visit.  Subjective Assessment - 06/22/19 0824    Subjective  Pt reports had an EKG on Tuesday (9/15) and  nurse said it looks like her heart is back in a-fib, but the doctor hasn't called and discussed everything yet. Pt reports getting flu and pneumonia shots yesterday and she feels sore and had to take tylenol. Pt reports feeling giddy and back to how she used to felt.    Patient Stated Goals  improve coordination, be able to raise up from commode    Currently in Pain?  No/denies              [facility] Adult PT Treatment/Exercise - 06/22/19 0001      Knee/Hip Exercises: Stretches   Active Hamstring Stretch  Both;2 reps;30 seconds    Active Hamstring Stretch Limitations  12" step      Knee/Hip Exercises: Standing   Heel Raises  Both;20 reps    Forward Step Up  15 reps;Hand Hold: 0;Step Height: 6"    Forward Step Up Limitations  no knee drives    Functional Squat  10 reps    Functional Squat Limitations  chair taps    Other Standing Knee Exercises  Vector stance, 3 sec hold, BLE x10 reps    Other Standing Knee Exercises  lateral stepping, GTB around thighs, x2RT             PT Education - 06/22/19 0825  Education Details  Exercise technique, continue HEP, progressing within pt tolerance    Person(s) Educated  Patient    Methods  Explanation    Comprehension  Verbalized understanding       PT Short Term Goals - 06/08/19 0928      PT SHORT TERM GOAL #1   Title  Patient to be compliant with correct performance of HEP, to be updated PRN    Baseline  06/08/19:  Reports compliance wiht HEP 2-3 times daily    Status  Achieved      PT SHORT TERM GOAL #2   Title  Patient to show glute and hamstring strength of at least 4+/5 in order to assist with functional tasks such as getting up from low seats and low commode    Baseline  06/08/19: see MMT    Status  Partially Met      PT SHORT TERM GOAL #3   Title  Patient to score at least 14/24 on DGI in order to show improved mobilty and reduced fall risk    Baseline  06/08/19: DGI 14/24 (was 10/24 initial eval)    Status  Achieved       PT SHORT TERM GOAL #4   Title  Patient to show improved gait pattern with reduction of wide based stance in order to show improved mobility and balance    Status  On-going        PT Long Term Goals - 06/08/19 1306      PT LONG TERM GOAL #1   Title  Patient to score at least 18/24 on DGI in order to show reduced fall risk and improved functional balance skills    Baseline  06/08/19: DGI 14/24 (was 10/24 initial eval)      PT LONG TERM GOAL #2   Title  Patient to report no falls within the past 4 weeks in order to show improved functional balance skills and reduced risk of fall-related injury    Baseline  06/08/19: No reports of falls, continues to feel unsteady and lightheadedness    Status  On-going      PT LONG TERM GOAL #3   Title  Patient to report resolution of dizziness and vertigo in order to improve QOL and assist in fall prevention    Baseline  06/08/19: No reports of falls, continues to feel unsteady and lightheadedness    Status  On-going      PT LONG TERM GOAL #4   Title  Patient to be able to state at least 3 concepts of energy conservation in order to show improved self-regulation skills and prevent patient from over-fatiguing throughout the day            Plan - 06/22/19 0854    Clinical Impression Statement  Pt with increased emotional stress this date due to recent death in family, caring for mother and with exacerbation of a-fib symptoms. Pt with increased unsteadiness, requiring single to bil UE support with strengthening exercises and balance exercises initially, progressing to no UE assist during therapy session. Pt tolerates strengthening exercises with normal exercise fatigue, denies increased dizziness or shortness of breath. Ended session early to maintain exercise within pt tolerance. Continue to progress as able respecting a-fib and dizziness symptoms.    Personal Factors and Comorbidities  Fitness;Comorbidity 2;Social Background;Education;Time since onset of  injury/illness/exacerbation    Comorbidities  obesity, A-fib    Examination-Activity Limitations  Transfers;Locomotion Level;Bed Mobility;Reach Overhead;Squat;Stairs;Stand    Examination-Participation Restrictions  Church;Yard Work;Cleaning;Community Activity;Interpersonal  Relationship;Volunteer    Stability/Clinical Decision Making  Unstable/Unpredictable    Rehab Potential  Good    PT Frequency  2x / week    PT Duration  8 weeks    PT Treatment/Interventions  ADLs/Self Care Home Management;Canalith Repostioning;DME Instruction;Gait training;Stair training;Functional mobility training;Therapeutic activities;Therapeutic exercise;Balance training;Patient/family education;Neuromuscular re-education;Manual techniques;Vestibular;Taping;Energy conservation    PT Next Visit Plan  Progress balance/coordination, hip and hamstring strength. Challenge coordination vs. attention with activities. Monitor a-fib and adverse reactions to exercise.    PT Home Exercise Plan  Eval: bridges, narrow BOS, partial tandem stance; 8/24: calf raises with UE support, STS without UE assist; 8/27: sidestepping and tandem stance with UE support; 9/10: vector stance forward/lateral/back with 5 sec hold    Consulted and Agree with Plan of Care  Patient       Patient will benefit from skilled therapeutic intervention in order to improve the following deficits and impairments:  Abnormal gait, Decreased coordination, Difficulty walking, Decreased safety awareness, Dizziness, Obesity, Decreased activity tolerance, Impaired vision/preception, Decreased balance, Decreased mobility, Decreased strength  Visit Diagnosis: Other symptoms and signs involving the nervous system  Unsteadiness on feet  Repeated falls  Dizziness and giddiness     Problem List Patient Active Problem List   Diagnosis Date Noted  . Snoring 04/21/2019  . Varicose veins of lower extremity 04/14/2019  . Cough 04/14/2019  . Atrial fibrillation ([place])  04/14/2019  . DOE (dyspnea on exertion) 04/14/2019  . Disequilibrium 04/13/2019  . Ataxia 04/13/2019  . Hearing loss 04/13/2019  . Aphasia 04/13/2019  . Type 2 diabetes mellitus without complication, without long-term current use of insulin ([place]) 09/29/2018  . Second degree heart block 05/05/2018  . Urinary incontinence 05/05/2018  . Chronic low back pain without sciatica 05/05/2018  . Hypertension associated with diabetes ([place]) 09/20/2017  . Actinic keratosis 02/24/2017  . Dysthymia 01/10/2016  . History of endometrial cancer 02/05/2015    Class: Stage 1  . History of migraine headaches 03/18/2011  . Allergic rhinitis due to pollen 03/18/2011  . Obesity (BMI 30-39.9) 03/18/2011     Talbot Grumbling PT, DPT 06/22/19, 8:56 AM [place] [address] [place], [name], [address] Phone: [phone]   Fax:  [phone]  Name: Laurie Mckinney MRN: [id number] Date of Birth: 07-24-1961

## 2019-06-26 ENCOUNTER — Ambulatory Visit (HOSPITAL_COMMUNITY): Payer: Managed Care, Other (non HMO)

## 2019-06-26 ENCOUNTER — Other Ambulatory Visit: Payer: Self-pay

## 2019-06-26 ENCOUNTER — Encounter (HOSPITAL_COMMUNITY): Payer: Self-pay

## 2019-06-26 DIAGNOSIS — R29818 Other symptoms and signs involving the nervous system: Secondary | ICD-10-CM | POA: Diagnosis not present

## 2019-06-26 DIAGNOSIS — R42 Dizziness and giddiness: Secondary | ICD-10-CM

## 2019-06-26 DIAGNOSIS — R2681 Unsteadiness on feet: Secondary | ICD-10-CM

## 2019-06-26 DIAGNOSIS — R296 Repeated falls: Secondary | ICD-10-CM

## 2019-06-26 NOTE — Therapy (Signed)
[place], [name], [address] Phone: [phone]   Fax:  [phone]  Physical Therapy Treatment  Patient Details  Name: Laurie Mckinney MRN: [id number] Date of Birth: 07/06/1961 Referring Provider (PT): Sarina Ill    Encounter Date: 06/26/2019  PT End of Session - 06/26/19 0830    Visit Number  8    Number of Visits  17    Date for PT Re-Evaluation  07/06/19   Minireassess complete 06/08/19; visit #5   Authorization Type  [employer] Managed (60 visits)    Authorization Time Period  05/11/19 to 07/06/19    Authorization - Visit Number  4    Authorization - Number of Visits  10    PT Start Time  0827   pt arrived late   PT Stop Time  0900    PT Time Calculation (min)  33 min    Equipment Utilized During Treatment  Gait belt    Activity Tolerance  Patient tolerated treatment well    Behavior During Therapy  [facility] for tasks assessed/performed       Past Medical History:  Diagnosis Date  . Adjustment insomnia    SHIFT WORK  . Allergy    RHINITIS  . Asthma   . Atrial flutter [facility]) 02/26/15   [facility] [place]  . Bursitis    right hip  . Diabetes mellitus without complication ([place])   . Hypertension   . Migraine headache   . Mobitz type 2 second degree heart block   . Obesity     Past Surgical History:  Procedure Laterality Date  . ABDOMINAL HYSTERECTOMY    . CARDIOVERSION N/A 05/25/2019   Procedure: CARDIOVERSION;  Surgeon: Arnoldo Lenis, MD;  Location: [facility];  Service: Endoscopy;  Laterality: N/A;  . COLONOSCOPY  2011   Dr.Brodie  . ROBOTIC ASSISTED TOTAL HYSTERECTOMY WITH BILATERAL SALPINGO OOPHERECTOMY Bilateral 02/05/2015   Procedure: ROBOTIC ASSISTED TOTAL LAPAROSCOPIC HYSTERECTOMY WITH BILATERAL SALPINGO OOPHORECTOMY;  Surgeon: Everitt Amber, MD;  Location: [facility];  Service: Gynecology;  Laterality: Bilateral;    There were no vitals filed for this visit.  Subjective Assessment -  06/26/19 0829    Subjective  Pt reports "not completely body confident".    Patient Stated Goals  improve coordination, be able to raise up from commode    Currently in Pain?  No/denies          [facility] Adult PT Treatment/Exercise - 06/26/19 0001      Knee/Hip Exercises: Standing   Forward Lunges  Both;10 reps          Balance Exercises - 06/26/19 0846      Balance Exercises: Standing   SLS  Eyes open;Foam/compliant surface;2 reps;30 secs    Other Standing Exercises  ladder drills x10 minutes        PT Education - 06/26/19 0905    Education Details  Continue HEP, exercise technique, reassessment 07/06/19    Person(s) Educated  Patient    Methods  Explanation    Comprehension  Verbalized understanding       PT Short Term Goals - 06/08/19 0928      PT SHORT TERM GOAL #1   Title  Patient to be compliant with correct performance of HEP, to be updated PRN    Baseline  06/08/19:  Reports compliance wiht HEP 2-3 times daily    Status  Achieved      PT SHORT TERM GOAL #2  Title  Patient to show glute and hamstring strength of at least 4+/5 in order to assist with functional tasks such as getting up from low seats and low commode    Baseline  06/08/19: see MMT    Status  Partially Met      PT SHORT TERM GOAL #3   Title  Patient to score at least 14/24 on DGI in order to show improved mobilty and reduced fall risk    Baseline  06/08/19: DGI 14/24 (was 10/24 initial eval)    Status  Achieved      PT SHORT TERM GOAL #4   Title  Patient to show improved gait pattern with reduction of wide based stance in order to show improved mobility and balance    Status  On-going        PT Long Term Goals - 06/08/19 1306      PT LONG TERM GOAL #1   Title  Patient to score at least 18/24 on DGI in order to show reduced fall risk and improved functional balance skills    Baseline  06/08/19: DGI 14/24 (was 10/24 initial eval)      PT LONG TERM GOAL #2   Title  Patient to report no falls  within the past 4 weeks in order to show improved functional balance skills and reduced risk of fall-related injury    Baseline  06/08/19: No reports of falls, continues to feel unsteady and lightheadedness    Status  On-going      PT LONG TERM GOAL #3   Title  Patient to report resolution of dizziness and vertigo in order to improve QOL and assist in fall prevention    Baseline  06/08/19: No reports of falls, continues to feel unsteady and lightheadedness    Status  On-going      PT LONG TERM GOAL #4   Title  Patient to be able to state at least 3 concepts of energy conservation in order to show improved self-regulation skills and prevent patient from over-fatiguing throughout the day            Plan - 06/26/19 0830    Clinical Impression Statement  Increased dynamic balance and coordination activity this date. Pt with good coordination with ladder drills, able to follow sequences and step appropriately 80% of the time. Progressed with SLS on foam surface this date, demonstrating moderate unsteadiness requiring intermittent UE assist on parallel bars to maintain upright trunk. Able to maintain SLS with UE assist for up to 5 sec increments. Added forward lunges for increased dynamic strengthening to also challenge balance when returning back to standing, able to perform without UE assist and SUPV for safety. Pt with set back last week, but with minimal improvements today, with improved focus, endurance, and balance with activities. Continue to progress as able.    Personal Factors and Comorbidities  Fitness;Comorbidity 2;Social Background;Education;Time since onset of injury/illness/exacerbation    Comorbidities  obesity, A-fib    Examination-Activity Limitations  Transfers;Locomotion Level;Bed Mobility;Reach Overhead;Squat;Stairs;Stand    Examination-Participation Restrictions  Church;Yard Work;Cleaning;Community Activity;Interpersonal Relationship;Volunteer    Stability/Clinical Decision  Making  Unstable/Unpredictable    Rehab Potential  Good    PT Frequency  2x / week    PT Duration  8 weeks    PT Treatment/Interventions  ADLs/Self Care Home Management;Canalith Repostioning;DME Instruction;Gait training;Stair training;Functional mobility training;Therapeutic activities;Therapeutic exercise;Balance training;Patient/family education;Neuromuscular re-education;Manual techniques;Vestibular;Taping;Energy conservation    PT Next Visit Plan  Continue ladder drills. Progress balance/coordination, hip and hamstring strength.  Challenge coordination vs. attention with activities. Monitor a-fib and adverse reactions to exercise.    PT Home Exercise Plan  Eval: bridges, narrow BOS, partial tandem stance; 8/24: calf raises with UE support, STS without UE assist; 8/27: sidestepping and tandem stance with UE support; 9/10: vector stance forward/lateral/back with 5 sec hold    Consulted and Agree with Plan of Care  Patient       Patient will benefit from skilled therapeutic intervention in order to improve the following deficits and impairments:  Abnormal gait, Decreased coordination, Difficulty walking, Decreased safety awareness, Dizziness, Obesity, Decreased activity tolerance, Impaired vision/preception, Decreased balance, Decreased mobility, Decreased strength  Visit Diagnosis: Other symptoms and signs involving the nervous system  Unsteadiness on feet  Repeated falls  Dizziness and giddiness     Problem List Patient Active Problem List   Diagnosis Date Noted  . Snoring 04/21/2019  . Varicose veins of lower extremity 04/14/2019  . Cough 04/14/2019  . Atrial fibrillation ([place]) 04/14/2019  . DOE (dyspnea on exertion) 04/14/2019  . Disequilibrium 04/13/2019  . Ataxia 04/13/2019  . Hearing loss 04/13/2019  . Aphasia 04/13/2019  . Type 2 diabetes mellitus without complication, without long-term current use of insulin ([place]) 09/29/2018  . Second degree heart block 05/05/2018  .  Urinary incontinence 05/05/2018  . Chronic low back pain without sciatica 05/05/2018  . Hypertension associated with diabetes ([place]) 09/20/2017  . Actinic keratosis 02/24/2017  . Dysthymia 01/10/2016  . History of endometrial cancer 02/05/2015    Class: Stage 1  . History of migraine headaches 03/18/2011  . Allergic rhinitis due to pollen 03/18/2011  . Obesity (BMI 30-39.9) 03/18/2011     Talbot Grumbling PT, DPT 06/26/19, 9:10 AM [place] [address] [place], [name], [address] Phone: [phone]   Fax:  [phone]  Name: Laurie Mckinney MRN: [id number] Date of Birth: [birth year]

## 2019-06-27 ENCOUNTER — Encounter: Payer: Self-pay | Admitting: Gynecologic Oncology

## 2019-06-27 ENCOUNTER — Encounter: Payer: Self-pay | Admitting: Neurology

## 2019-06-27 ENCOUNTER — Telehealth: Payer: Self-pay | Admitting: *Deleted

## 2019-06-27 ENCOUNTER — Encounter: Payer: Self-pay | Admitting: Family Medicine

## 2019-06-27 NOTE — Telephone Encounter (Signed)
Returned patient's call and scheduled appt for October

## 2019-06-28 ENCOUNTER — Telehealth: Payer: Self-pay | Admitting: *Deleted

## 2019-06-28 NOTE — Telephone Encounter (Signed)
LM to return call - also sent MyChart message

## 2019-06-28 NOTE — Telephone Encounter (Signed)
I don't see that it was ever routed to me. EKG shows she is back in afib which is ok, more important thing is her heart rates are controlled. Did she feel any better after the cardioversion, any recurrence in her prior symptoms?   Zandra Abts MD

## 2019-06-28 NOTE — Telephone Encounter (Signed)
Pt asking if EKG was ok from 9/15 nurse appt

## 2019-06-29 ENCOUNTER — Other Ambulatory Visit: Payer: Self-pay | Admitting: Medical

## 2019-06-29 ENCOUNTER — Encounter (HOSPITAL_COMMUNITY): Payer: Self-pay | Admitting: Physical Therapy

## 2019-06-29 ENCOUNTER — Ambulatory Visit (HOSPITAL_COMMUNITY): Payer: Managed Care, Other (non HMO) | Admitting: Physical Therapy

## 2019-06-29 ENCOUNTER — Other Ambulatory Visit: Payer: Self-pay

## 2019-06-29 DIAGNOSIS — R296 Repeated falls: Secondary | ICD-10-CM

## 2019-06-29 DIAGNOSIS — R29818 Other symptoms and signs involving the nervous system: Secondary | ICD-10-CM | POA: Diagnosis not present

## 2019-06-29 DIAGNOSIS — R2681 Unsteadiness on feet: Secondary | ICD-10-CM

## 2019-06-29 DIAGNOSIS — R42 Dizziness and giddiness: Secondary | ICD-10-CM

## 2019-06-29 NOTE — Telephone Encounter (Signed)
Pt agreeable to phone call - will have added to schedule

## 2019-06-29 NOTE — Telephone Encounter (Signed)
Is this okay to refill? 

## 2019-06-29 NOTE — Telephone Encounter (Signed)
Can we do a virtual visit tomorrow at 940. Her collection of symptoms were not solely heart related, and we need to really look at them currently and how they compared to right after the cardioversion to decide on our next step.    Zandra Abts MD

## 2019-06-29 NOTE — Telephone Encounter (Signed)
That part of my symptoms was doing better. In the past couple of weeks I have felt a physical awkwardness return. So much so, that I might hold the walls or grab on the furniture when I walk through the house or the yard.  For some reason my EKG, in MyChart, had a unrecognizable Dr's name to it. I submitted a message and it was sent to my PCF, Jill Alexanders. He said I was in persistent AFib and should follow up with a cardiologist. I thought I was. Where do I go from here? (Copied from [employer])

## 2019-06-29 NOTE — Therapy (Signed)
[place], [name], [address] Phone: [phone]   Fax:  [phone]  Physical Therapy Treatment  Patient Details  Name: Laurie Mckinney MRN: [id number] Date of Birth: 06-12-1961 Referring Provider (PT): Sarina Ill    Encounter Date: 06/29/2019  PT End of Session - 06/29/19 0900    Visit Number  9    Number of Visits  17    Date for PT Re-Evaluation  07/06/19   Minireassess complete 06/08/19; visit #5   Authorization Type  [employer] Managed (60 visits)    Authorization Time Period  05/11/19 to 07/06/19    Authorization - Visit Number  5    Authorization - Number of Visits  10    PT Start Time  0825   Patient arrived late   PT Stop Time  0855    PT Time Calculation (min)  30 min    Equipment Utilized During Treatment  Gait belt    Activity Tolerance  Patient tolerated treatment well    Behavior During Therapy  [facility] for tasks assessed/performed       Past Medical History:  Diagnosis Date  . Adjustment insomnia    SHIFT WORK  . Allergy    RHINITIS  . Asthma   . Atrial flutter [facility]) 02/26/15   [facility] [place]  . Bursitis    right hip  . Diabetes mellitus without complication ([place])   . Hypertension   . Migraine headache   . Mobitz type 2 second degree heart block   . Obesity     Past Surgical History:  Procedure Laterality Date  . ABDOMINAL HYSTERECTOMY    . CARDIOVERSION N/A 05/25/2019   Procedure: CARDIOVERSION;  Surgeon: Arnoldo Lenis, MD;  Location: [facility];  Service: Endoscopy;  Laterality: N/A;  . COLONOSCOPY  2011   Dr.Brodie  . ROBOTIC ASSISTED TOTAL HYSTERECTOMY WITH BILATERAL SALPINGO OOPHERECTOMY Bilateral 02/05/2015   Procedure: ROBOTIC ASSISTED TOTAL LAPAROSCOPIC HYSTERECTOMY WITH BILATERAL SALPINGO OOPHORECTOMY;  Surgeon: Everitt Amber, MD;  Location: [facility];  Service: Gynecology;  Laterality: Bilateral;    There were no vitals filed for this visit.  Subjective Assessment -  06/29/19 0828    Subjective  Patient denied any pain. She said she doesn't feel like she has full body control.    Patient Stated Goals  improve coordination, be able to raise up from commode    Currently in Pain?  No/denies                       [facility] Adult PT Treatment/Exercise - 06/29/19 0001      Knee/Hip Exercises: Standing   Forward Lunges  Both;10 reps      Knee/Hip Exercises: Seated   Sit to Sand  2 sets;10 reps   Stepping toward colored cones with each STS         Balance Exercises - 06/29/19 0856      Balance Exercises: Standing   SLS  Eyes open;Foam/compliant surface;2 reps;30 secs    Retro Gait  4 reps   in // bars   Other Standing Exercises  ladder drills x10 minutes    Overall Comments  --   Tandem gait 4 reps in // bars         PT Short Term Goals - 06/08/19 2706      PT SHORT TERM GOAL #1   Title  Patient to be compliant with correct performance of HEP, to be  updated PRN    Baseline  06/08/19:  Reports compliance wiht HEP 2-3 times daily    Status  Achieved      PT SHORT TERM GOAL #2   Title  Patient to show glute and hamstring strength of at least 4+/5 in order to assist with functional tasks such as getting up from low seats and low commode    Baseline  06/08/19: see MMT    Status  Partially Met      PT SHORT TERM GOAL #3   Title  Patient to score at least 14/24 on DGI in order to show improved mobilty and reduced fall risk    Baseline  06/08/19: DGI 14/24 (was 10/24 initial eval)    Status  Achieved      PT SHORT TERM GOAL #4   Title  Patient to show improved gait pattern with reduction of wide based stance in order to show improved mobility and balance    Status  On-going        PT Long Term Goals - 06/08/19 1306      PT LONG TERM GOAL #1   Title  Patient to score at least 18/24 on DGI in order to show reduced fall risk and improved functional balance skills    Baseline  06/08/19: DGI 14/24 (was 10/24 initial eval)      PT  LONG TERM GOAL #2   Title  Patient to report no falls within the past 4 weeks in order to show improved functional balance skills and reduced risk of fall-related injury    Baseline  06/08/19: No reports of falls, continues to feel unsteady and lightheadedness    Status  On-going      PT LONG TERM GOAL #3   Title  Patient to report resolution of dizziness and vertigo in order to improve QOL and assist in fall prevention    Baseline  06/08/19: No reports of falls, continues to feel unsteady and lightheadedness    Status  On-going      PT LONG TERM GOAL #4   Title  Patient to be able to state at least 3 concepts of energy conservation in order to show improved self-regulation skills and prevent patient from over-fatiguing throughout the day            Plan - 06/29/19 0901    Clinical Impression Statement  Continued with established POC this session. Patient did arrive late to session and therefore it was limited due to this. Added sit to stands with stepping towards cones as targets this session. Patient demonstrated good form with this and required only minimal cueing. Patient reported feeling good at end of session with no complaints of pain throughout.    Personal Factors and Comorbidities  Fitness;Comorbidity 2;Social Background;Education;Time since onset of injury/illness/exacerbation    Comorbidities  obesity, A-fib    Examination-Activity Limitations  Transfers;Locomotion Level;Bed Mobility;Reach Overhead;Squat;Stairs;Stand    Examination-Participation Restrictions  Church;Yard Work;Cleaning;Community Activity;Interpersonal Relationship;Volunteer    Stability/Clinical Decision Making  Unstable/Unpredictable    Rehab Potential  Good    PT Frequency  2x / week    PT Duration  8 weeks    PT Treatment/Interventions  ADLs/Self Care Home Management;Canalith Repostioning;DME Instruction;Gait training;Stair training;Functional mobility training;Therapeutic activities;Therapeutic  exercise;Balance training;Patient/family education;Neuromuscular re-education;Manual techniques;Vestibular;Taping;Energy conservation    PT Next Visit Plan  Continue ladder drills. Progress balance/coordination, hip and hamstring strength. Challenge coordination vs. attention with activities. Monitor a-fib and adverse reactions to exercise.    PT Home Exercise Plan    Eval: bridges, narrow BOS, partial tandem stance; 8/24: calf raises with UE support, STS without UE assist; 8/27: sidestepping and tandem stance with UE support; 9/10: vector stance forward/lateral/back with 5 sec hold    Consulted and Agree with Plan of Care  Patient       Patient will benefit from skilled therapeutic intervention in order to improve the following deficits and impairments:  Abnormal gait, Decreased coordination, Difficulty walking, Decreased safety awareness, Dizziness, Obesity, Decreased activity tolerance, Impaired vision/preception, Decreased balance, Decreased mobility, Decreased strength  Visit Diagnosis: Other symptoms and signs involving the nervous system  Unsteadiness on feet  Repeated falls  Dizziness and giddiness     Problem List Patient Active Problem List   Diagnosis Date Noted  . Snoring 04/21/2019  . Varicose veins of lower extremity 04/14/2019  . Cough 04/14/2019  . Atrial fibrillation ([place]) 04/14/2019  . DOE (dyspnea on exertion) 04/14/2019  . Disequilibrium 04/13/2019  . Ataxia 04/13/2019  . Hearing loss 04/13/2019  . Aphasia 04/13/2019  . Type 2 diabetes mellitus without complication, without long-term current use of insulin ([place]) 09/29/2018  . Second degree heart block 05/05/2018  . Urinary incontinence 05/05/2018  . Chronic low back pain without sciatica 05/05/2018  . Hypertension associated with diabetes ([place]) 09/20/2017  . Actinic keratosis 02/24/2017  . Dysthymia 01/10/2016  . History of endometrial cancer 02/05/2015    Class: Stage 1  . History of migraine headaches  03/18/2011  . Allergic rhinitis due to pollen 03/18/2011  . Obesity (BMI 30-39.9) 03/18/2011   Clarene Critchley PT, DPT 9:02 AM, 06/29/19 [place], [name], [address] Phone: [phone]   Fax:  [phone]  Name: Laurie Mckinney MRN: [id number] Date of Birth: Jan 29, 1961

## 2019-06-30 ENCOUNTER — Telehealth (INDEPENDENT_AMBULATORY_CARE_PROVIDER_SITE_OTHER): Payer: Managed Care, Other (non HMO) | Admitting: Cardiology

## 2019-06-30 ENCOUNTER — Encounter: Payer: Self-pay | Admitting: Cardiology

## 2019-06-30 VITALS — BP 162/107 | HR 69 | Temp 98.1°F | Ht 71.0 in | Wt 280.0 lb

## 2019-06-30 DIAGNOSIS — I4819 Other persistent atrial fibrillation: Secondary | ICD-10-CM

## 2019-06-30 NOTE — Progress Notes (Signed)
Virtual Visit via Telephone Note   This visit type was conducted due to national recommendations for restrictions regarding the COVID-19 Pandemic (e.g. social distancing) in an effort to limit this patient's exposure and mitigate transmission in our community.  Due to her co-morbid illnesses, this patient is at least at moderate risk for complications without adequate follow up.  This format is felt to be most appropriate for this patient at this time.  The patient did not have access to video technology/had technical difficulties with video requiring transitioning to audio format only (telephone).  All issues noted in this document were discussed and addressed.  No physical exam could be performed with this format.  Please refer to the patient's chart for her  consent to telehealth for [facility].   Date:  06/30/2019   ID:  Laurie Mckinney, DOB Jun 15, 1961, MRN [id number]  Patient Location: Home Provider Location: Office  PCP:  Denita Lung, MD  Cardiologist:  Carlyle Dolly, MD  Electrophysiologist:  None   Evaluation Performed:  Follow-Up Visit  Chief Complaint:  Follow up visit  History of Present Illness:    Laurie Mckinney is a 58 y.o. female  seen today for follow up of the following medical problems.   1. AFib/Aflutter - previously seen in afib clinic - history of aflutter in 2016 postop - relatively new diagnosis of afib earlier this year by pcp - avoiding av nodal agents due to history of 2nd degree AV block type I - from afib clinic would consider dofetilide if neccesary - has been on eliquis.   - last visit reported a constellation of symptoms including SOB, dizziness, fatigue. It was unclear how much may be related to her afib given it was rate controlled.  - s/p cardioversion. Felt much improved after - After a few weeks symptoms started to gradually reoccur including SOB, dizziness, fatigue     2. SOB/DOE - dyspnea just walking in from parking  lot - significantly worsened over the summer - mild swelling at times when sitting long times.  - some orthopnea.   06/2019 echo LVEF 55-60%, mild LAE  3. Chest pain - sharp pain midchest to epigastric, 5-10/10 in severity. Ongoing for few years - can occur at any time. Not positional. Somewhat better with antacids. - no other associated symptoms - reports remote stress test in the past.   01/2015 GXT no ischemic changes.  - no recent symptoms       5. Aphasia/Fatigue - followed by neurology - working with PT - thought to be a large emotional/stress related component from neuro notes - awaiting sleep study  The patient does not have symptoms concerning for COVID-19 infection (fever, chills, cough, or new shortness of breath).    Past Medical History:  Diagnosis Date  . Adjustment insomnia    SHIFT WORK  . Allergy    RHINITIS  . Asthma   . Atrial flutter [facility]) 02/26/15   [facility] [place]  . Bursitis    right hip  . Diabetes mellitus without complication ([place])   . Hypertension   . Migraine headache   . Mobitz type 2 second degree heart block   . Obesity    Past Surgical History:  Procedure Laterality Date  . ABDOMINAL HYSTERECTOMY    . CARDIOVERSION N/A 05/25/2019   Procedure: CARDIOVERSION;  Surgeon: Arnoldo Lenis, MD;  Location: [facility];  Service: Endoscopy;  Laterality: N/A;  . COLONOSCOPY  2011   Dr.Brodie  .  ROBOTIC ASSISTED TOTAL HYSTERECTOMY WITH BILATERAL SALPINGO OOPHERECTOMY Bilateral 02/05/2015   Procedure: ROBOTIC ASSISTED TOTAL LAPAROSCOPIC HYSTERECTOMY WITH BILATERAL SALPINGO OOPHORECTOMY;  Surgeon: Everitt Amber, MD;  Location: [facility];  Service: Gynecology;  Laterality: Bilateral;     No outpatient medications have been marked as taking for the 06/30/19 encounter (Appointment) with Arnoldo Lenis, MD.     Allergies:   Patient has no known allergies.   Social History   Tobacco Use  . Smoking status: Never Smoker  .  Smokeless tobacco: Never Used  Substance Use Topics  . Alcohol use: No  . Drug use: No     Family Hx: The patient's family history includes Arthritis in her mother; Heart disease in her father.  ROS:   Please see the history of present illness.     All other systems reviewed and are negative.   Prior CV studies:   The following studies were reviewed today:  06/2019 echo IMPRESSIONS    1. The left ventricle has normal systolic function, with an ejection fraction of 55-60%. The cavity size was normal. Left ventricular diastolic Doppler parameters are indeterminate.  2. The right ventricle has normal systolic function. The cavity was normal. There is no increase in right ventricular wall thickness.  3. Left atrial size was mildly dilated.  4. No evidence of mitral valve stenosis.  5. The aortic valve has an indeterminate number of cusps. No stenosis of the aortic valve.  6. The aorta is normal unless otherwise noted.  7. The aortic root is normal in size and structure.  8. Pulmonary hypertension is indeterminant, inadequate TR jet.  9. The interatrial septum was not well visualized.  Labs/Other Tests and Data Reviewed:    EKG:  No ECG reviewed.  Recent Labs: 04/14/2019: ALT 19; TSH 0.600 04/30/2019: Hemoglobin 14.5; Platelets 282 05/23/2019: BUN 11; Creatinine, Ser 0.66; Potassium 3.9; Sodium 138   Recent Lipid Panel Lab Results  Component Value Date/Time   CHOL 131 05/05/2018 02:48 PM   TRIG 56 05/05/2018 02:48 PM   HDL 52 05/05/2018 02:48 PM   CHOLHDL 2.5 05/05/2018 02:48 PM   CHOLHDL 2.5 01/21/2017 09:21 AM   LDLCALC 68 05/05/2018 02:48 PM    Wt Readings from Last 3 Encounters:  06/06/19 279 lb (126.6 kg)  06/06/19 278 lb 3.2 oz (126.2 kg)  05/23/19 275 lb (124.7 kg)     Objective:    Vital Signs:   Today's Vitals   06/30/19 0928  BP: (!) 162/107  Pulse: 69  Temp: 98.1 F (36.7 C)  Weight: 280 lb (127 kg)  Height: 5\' 11"  (1.803 m)   Body mass index  is 39.05 kg/m. Normal affect. Normal speech pattern and tone. Comfortabel, no apparent distress. No audible signs of SOB or wheezing.   ASSESSMENT & PLAN:    1. Afib - recent constellation of symptoms of fatigue, SOB, dizziness, but also unconventional symptos such as "slow thoughts", "feeling loopy". Clearly afib was not the cause of all these symptoms but was unclear if could be playing some role despite being rate controlled - after cardioversion she reported initial significant improvement in her breathing, dizziness and fatigue.  - I think she just does not tolerat event rate controlled afib - had been seen by afib clinic, but prefers to be seen locally. From afib clinic note dofetilide would be AAD of choice. Refer to Dr Lovena Le to consider antiarrhythmic therapy.      COVID-19 Education: The signs and symptoms of COVID-19 were  discussed with the patient and how to seek care for testing (follow up with PCP or arrange E-visit).  The importance of social distancing was discussed today.  Time:   Today, I have spent 14 minutes with the patient with telehealth technology discussing the above problems.     Medication Adjustments/Labs and Tests Ordered: Current medicines are reviewed at length with the patient today.  Concerns regarding medicines are outlined above.   Tests Ordered: No orders of the defined types were placed in this encounter.   Medication Changes: No orders of the defined types were placed in this encounter.   Follow Up:  In Person in 3 month(s)  Signed, Carlyle Dolly, MD  06/30/2019 8:15 AM    [place]

## 2019-07-03 ENCOUNTER — Other Ambulatory Visit: Payer: Self-pay

## 2019-07-03 ENCOUNTER — Encounter (HOSPITAL_COMMUNITY): Payer: Self-pay

## 2019-07-03 ENCOUNTER — Ambulatory Visit (HOSPITAL_COMMUNITY): Payer: Managed Care, Other (non HMO)

## 2019-07-03 DIAGNOSIS — R2681 Unsteadiness on feet: Secondary | ICD-10-CM

## 2019-07-03 DIAGNOSIS — R29818 Other symptoms and signs involving the nervous system: Secondary | ICD-10-CM

## 2019-07-03 DIAGNOSIS — R42 Dizziness and giddiness: Secondary | ICD-10-CM

## 2019-07-03 DIAGNOSIS — R296 Repeated falls: Secondary | ICD-10-CM

## 2019-07-03 NOTE — Therapy (Signed)
[facility] [address] [place], [name], [address] Phone: [phone]   Fax:  [phone]  Physical Therapy Treatment  Patient Details  Name: Laurie Mckinney MRN: [id number] Date of Birth: January 31, 1961 Referring Provider (PT): Sarina Ill    Encounter Date: 07/03/2019  PT End of Session - 07/03/19 0820    Visit Number  10    Number of Visits  17    Date for PT Re-Evaluation  07/06/19   Minireassess complete 06/08/19; visit #5   Authorization Type  [employer] Managed (60 visits)    Authorization Time Period  05/11/19 to 07/06/19    Authorization - Visit Number  6    Authorization - Number of Visits  10    PT Start Time  0817    PT Stop Time  0857    PT Time Calculation (min)  40 min    Equipment Utilized During Treatment  --    Activity Tolerance  Patient tolerated treatment well    Behavior During Therapy  [facility] for tasks assessed/performed       Past Medical History:  Diagnosis Date  . Adjustment insomnia    SHIFT WORK  . Allergy    RHINITIS  . Asthma   . Atrial flutter [facility]) 02/26/15   [facility] [place]  . Bursitis    right hip  . Diabetes mellitus without complication ([place])   . Hypertension   . Migraine headache   . Mobitz type 2 second degree heart block   . Obesity     Past Surgical History:  Procedure Laterality Date  . ABDOMINAL HYSTERECTOMY    . CARDIOVERSION N/A 05/25/2019   Procedure: CARDIOVERSION;  Surgeon: Arnoldo Lenis, MD;  Location: [facility];  Service: Endoscopy;  Laterality: N/A;  . COLONOSCOPY  2011   Dr.Brodie  . ROBOTIC ASSISTED TOTAL HYSTERECTOMY WITH BILATERAL SALPINGO OOPHERECTOMY Bilateral 02/05/2015   Procedure: ROBOTIC ASSISTED TOTAL LAPAROSCOPIC HYSTERECTOMY WITH BILATERAL SALPINGO OOPHORECTOMY;  Surgeon: Everitt Amber, MD;  Location: [facility];  Service: Gynecology;  Laterality: Bilateral;    There were no vitals filed for this visit.  Subjective Assessment - 07/03/19 0821    Subjective   Pt reports feeling better than 2 months ago, still has some insecurities about balance.    Patient Stated Goals  improve coordination, be able to raise up from commode    Currently in Pain?  No/denies             [facility] Adult PT Treatment/Exercise - 07/03/19 0001      Ambulation/Gait   Stairs  Yes    Stairs Assistance  7: Independent    Number of Stairs  4   8 reps   Height of Stairs  6    Gait Comments  stair training, reciprocal pattern, without handrail, increased speed to practice strength, balance and endurance      Knee/Hip Exercises: Standing   Gait Training  gait training on level ground, head turns R/L and up/down, speed changes, x12 minutes          Balance Exercises - 07/03/19 0911      Balance Exercises: Standing   Tandem Stance  Eyes open;Foam/compliant surface;3 reps;30 secs   L back: 30 sec best, R back: 10 sec best   Marching Limitations  dual task gait, x10 minutes    Other Standing Exercises  ladder drills x10 minutes        PT Education - 07/03/19 0820    Education  Details  Continue HEP, exercise technique    Person(s) Educated  Patient    Methods  Explanation    Comprehension  Verbalized understanding       PT Short Term Goals - 06/08/19 0928      PT SHORT TERM GOAL #1   Title  Patient to be compliant with correct performance of HEP, to be updated PRN    Baseline  06/08/19:  Reports compliance wiht HEP 2-3 times daily    Status  Achieved      PT SHORT TERM GOAL #2   Title  Patient to show glute and hamstring strength of at least 4+/5 in order to assist with functional tasks such as getting up from low seats and low commode    Baseline  06/08/19: see MMT    Status  Partially Met      PT SHORT TERM GOAL #3   Title  Patient to score at least 14/24 on DGI in order to show improved mobilty and reduced fall risk    Baseline  06/08/19: DGI 14/24 (was 10/24 initial eval)    Status  Achieved      PT SHORT TERM GOAL #4   Title  Patient to show  improved gait pattern with reduction of wide based stance in order to show improved mobility and balance    Status  On-going        PT Long Term Goals - 06/08/19 1306      PT LONG TERM GOAL #1   Title  Patient to score at least 18/24 on DGI in order to show reduced fall risk and improved functional balance skills    Baseline  06/08/19: DGI 14/24 (was 10/24 initial eval)      PT LONG TERM GOAL #2   Title  Patient to report no falls within the past 4 weeks in order to show improved functional balance skills and reduced risk of fall-related injury    Baseline  06/08/19: No reports of falls, continues to feel unsteady and lightheadedness    Status  On-going      PT LONG TERM GOAL #3   Title  Patient to report resolution of dizziness and vertigo in order to improve QOL and assist in fall prevention    Baseline  06/08/19: No reports of falls, continues to feel unsteady and lightheadedness    Status  On-going      PT LONG TERM GOAL #4   Title  Patient to be able to state at least 3 concepts of energy conservation in order to show improved self-regulation skills and prevent patient from over-fatiguing throughout the day            Plan - 07/03/19 1610    Clinical Impression Statement  Performed gait training this date with head turns and speed changes. Pt with decreased speed when completing R/L head turns to maintain stability, but no protective stepping required. Pt with minimal change in speed when going from fast to slow speeds. Pt with good carryover on ladder drills, able to complete different sequences correctly; no speed challenges this date with ladder challenges. Added dual task gait activities this date to challenge multitasking balance requiring decreased speed for math challenge but no decrease speed with hand-eye challenge of pouring back and forth between cups. Completed stair training to practice navigating work environment, training with reciprocal pattern and no handrail,  challenging pt to increase speed to challenge balance, strength and endurance. Pt without missteps or loss of balance with  stair training. Pt continues to be challenged with static balance standing on foam surface, only able to tolerate up to 20 sec. Will reassess next session.    Personal Factors and Comorbidities  Fitness;Comorbidity 2;Social Background;Education;Time since onset of injury/illness/exacerbation    Comorbidities  obesity, A-fib    Examination-Activity Limitations  Transfers;Locomotion Level;Bed Mobility;Reach Overhead;Squat;Stairs;Stand    Examination-Participation Restrictions  Church;Yard Work;Cleaning;Community Activity;Interpersonal Relationship;Volunteer    Stability/Clinical Decision Making  Unstable/Unpredictable    Rehab Potential  Good    PT Frequency  2x / week    PT Duration  8 weeks    PT Treatment/Interventions  ADLs/Self Care Home Management;Canalith Repostioning;DME Instruction;Gait training;Stair training;Functional mobility training;Therapeutic activities;Therapeutic exercise;Balance training;Patient/family education;Neuromuscular re-education;Manual techniques;Vestibular;Taping;Energy conservation    PT Next Visit Plan  Reassessment. Progress balance/coordination, hip and hamstring strength. Challenge coordination vs. attention with activities. Monitor a-fib and adverse reactions to exercise.    PT Home Exercise Plan  Eval: bridges, narrow BOS, partial tandem stance; 8/24: calf raises with UE support, STS without UE assist; 8/27: sidestepping and tandem stance with UE support; 9/10: vector stance forward/lateral/back with 5 sec hold    Consulted and Agree with Plan of Care  Patient       Patient will benefit from skilled therapeutic intervention in order to improve the following deficits and impairments:  Abnormal gait, Decreased coordination, Difficulty walking, Decreased safety awareness, Dizziness, Obesity, Decreased activity tolerance, Impaired  vision/preception, Decreased balance, Decreased mobility, Decreased strength  Visit Diagnosis: Other symptoms and signs involving the nervous system  Unsteadiness on feet  Repeated falls  Dizziness and giddiness     Problem List Patient Active Problem List   Diagnosis Date Noted  . Snoring 04/21/2019  . Varicose veins of lower extremity 04/14/2019  . Cough 04/14/2019  . Atrial fibrillation ([place]) 04/14/2019  . DOE (dyspnea on exertion) 04/14/2019  . Disequilibrium 04/13/2019  . Ataxia 04/13/2019  . Hearing loss 04/13/2019  . Aphasia 04/13/2019  . Type 2 diabetes mellitus without complication, without long-term current use of insulin ([place]) 09/29/2018  . Second degree heart block 05/05/2018  . Urinary incontinence 05/05/2018  . Chronic low back pain without sciatica 05/05/2018  . Hypertension associated with diabetes ([place]) 09/20/2017  . Actinic keratosis 02/24/2017  . Dysthymia 01/10/2016  . History of endometrial cancer 02/05/2015    Class: Stage 1  . History of migraine headaches 03/18/2011  . Allergic rhinitis due to pollen 03/18/2011  . Obesity (BMI 30-39.9) 03/18/2011    Talbot Grumbling PT, DPT 07/03/19, 9:11 AM [facility] [address] [place], [name], [address] Phone: [phone]   Fax:  [phone]  Name: Laurie Mckinney MRN: [id number] Date of Birth: 01/23/1961

## 2019-07-06 ENCOUNTER — Encounter (HOSPITAL_COMMUNITY): Payer: Self-pay

## 2019-07-06 ENCOUNTER — Other Ambulatory Visit: Payer: Self-pay

## 2019-07-06 ENCOUNTER — Ambulatory Visit (HOSPITAL_COMMUNITY): Payer: Managed Care, Other (non HMO) | Attending: Neurology

## 2019-07-06 DIAGNOSIS — R42 Dizziness and giddiness: Secondary | ICD-10-CM | POA: Insufficient documentation

## 2019-07-06 DIAGNOSIS — R29818 Other symptoms and signs involving the nervous system: Secondary | ICD-10-CM | POA: Diagnosis not present

## 2019-07-06 DIAGNOSIS — R296 Repeated falls: Secondary | ICD-10-CM | POA: Insufficient documentation

## 2019-07-06 DIAGNOSIS — R2681 Unsteadiness on feet: Secondary | ICD-10-CM | POA: Diagnosis present

## 2019-07-06 NOTE — Therapy (Signed)
[facility] [address] [place], [name], [address] Phone: [phone]   Fax:  [phone]  PHYSICAL THERAPY DISCHARGE SUMMARY  Visits from Start of Care: 11  Current functional level related to goals / functional outcomes: See below   Remaining deficits: See below   Education / Equipment: HEP  Plan: Patient agrees to discharge.  Patient goals were partially met. Patient is being discharged due to being pleased with the current functional level.  ?????       Physical Therapy Treatment  Patient Details  Name: Laurie Mckinney MRN: [id number] Date of Birth: February 01, 1961 Referring Provider (PT): Sarina Ill    Encounter Date: 07/06/2019  PT End of Session - 07/06/19 0821    Visit Number  11    Number of Visits  17    Date for PT Re-Evaluation  07/06/19   Minireassess complete 06/08/19; visit #5   Authorization Type  [employer] Managed (60 visits)    Authorization Time Period  05/11/19 to 07/06/19    Authorization - Visit Number  1    Authorization - Number of Visits  10    PT Start Time  0820    PT Stop Time  3893    PT Time Calculation (min)  35 min    Activity Tolerance  Patient tolerated treatment well    Behavior During Therapy  [facility] for tasks assessed/performed       Past Medical History:  Diagnosis Date  . Adjustment insomnia    SHIFT WORK  . Allergy    RHINITIS  . Asthma   . Atrial flutter [facility]) 02/26/15   [facility]  . Bursitis    right hip  . Diabetes mellitus without complication ([place])   . Hypertension   . Migraine headache   . Mobitz type 2 second degree heart block   . Obesity     Past Surgical History:  Procedure Laterality Date  . ABDOMINAL HYSTERECTOMY    . CARDIOVERSION N/A 05/25/2019   Procedure: CARDIOVERSION;  Surgeon: Arnoldo Lenis, MD;  Location: [facility];  Service: Endoscopy;  Laterality: N/A;  . COLONOSCOPY  2011   Dr.Brodie  . ROBOTIC ASSISTED TOTAL HYSTERECTOMY WITH  BILATERAL SALPINGO OOPHERECTOMY Bilateral 02/05/2015   Procedure: ROBOTIC ASSISTED TOTAL LAPAROSCOPIC HYSTERECTOMY WITH BILATERAL SALPINGO OOPHORECTOMY;  Surgeon: Everitt Amber, MD;  Location: [facility];  Service: Gynecology;  Laterality: Bilateral;    There were no vitals filed for this visit.  Subjective Assessment - 07/06/19 0822    Subjective  Pt feels like she is still moving in the right direction, back to 80% of normal. Pt reports increase in a-fib symptoms for past 2 weeks with endurance and SOB increasing, but is managing her fatigue better with taking breaks, limiting errands and time caring for others, and better rest.    Patient Stated Goals  improve coordination, be able to raise up from commode    Currently in Pain?  No/denies         [facility] PT Assessment - 07/06/19 0001      Assessment   Medical Diagnosis  ataxia     Referring Provider (PT)  Sarina Ill     Onset Date/Surgical Date  --   July 2020   Next MD Visit  10/5 and 10/6 for sleep study and narcolepsy    Prior Therapy  years ago for my anlke       Balance Screen   Has the patient fallen in the  past 6 months  Yes    How many times?  1 on 06/06/19 carrying groceries    Has the patient had a decrease in activity level because of a fear of falling?   No    Is the patient reluctant to leave their home because of a fear of falling?   No      Strength   Strength Assessment Site  Hip;Knee;Ankle    Right/Left Hip  Right;Left    Right Hip Flexion  5/5   was 5   Right Hip Extension  4+/5   was 3   Right Hip ABduction  5/5   was 5   Left Hip Flexion  5/5   was 5   Left Hip Extension  4+/5   was 4-   Left Hip ABduction  5/5   was 5   Right Knee Flexion  5/5   was 4+   Right Knee Extension  5/5   was 5   Left Knee Flexion  5/5   was 4+   Left Knee Extension  5/5   was 5   Right Ankle Dorsiflexion  5/5   was 5   Left Ankle Dorsiflexion  5/5   was 5     Standardized Balance Assessment   Standardized Balance  Assessment  Dynamic Gait Index      Dynamic Gait Index   Level Surface  Normal    Change in Gait Speed  Mild Impairment    Gait with Horizontal Head Turns  Normal    Gait with Vertical Head Turns  Normal    Gait and Pivot Turn  Normal    Step Over Obstacle  Mild Impairment    Step Around Obstacles  Mild Impairment    Steps  Normal    Total Score  21    DGI comment:  was 14/24               PT Education - 07/06/19 0822    Education Details  Reassessment findings, d/c ot HEP    Person(s) Educated  Patient    Methods  Explanation    Comprehension  Verbalized understanding       PT Short Term Goals - 07/06/19 0823      PT SHORT TERM GOAL #1   Title  Patient to be compliant with correct performance of HEP, to be updated PRN    Baseline  06/08/19:  Reports compliance wiht HEP 2-3 times daily; 10/1: reports compliance, increased activity    Status  Achieved      PT SHORT TERM GOAL #2   Title  Patient to show glute and hamstring strength of at least 4+/5 in order to assist with functional tasks such as getting up from low seats and low commode    Baseline  06/08/19: see MMT; 10/1: see MMT    Status  Achieved      PT SHORT TERM GOAL #3   Title  Patient to score at least 14/24 on DGI in order to show improved mobilty and reduced fall risk    Baseline  06/08/19: DGI 14/24 (was 10/24 initial eval); 10/1: DGI 21/24    Status  Achieved      PT SHORT TERM GOAL #4   Title  Patient to show improved gait pattern with reduction of wide based stance in order to show improved mobility and balance    Baseline  10/1: pt with gait pattern WNL    Status  Achieved  PT Long Term Goals - 07/06/19 0824      PT LONG TERM GOAL #1   Title  Patient to score at least 18/24 on DGI in order to show reduced fall risk and improved functional balance skills    Baseline  06/08/19: DGI 14/24 (was 10/24 initial eval); 10/1: DGI 21/24    Status  Achieved      PT LONG TERM GOAL #2   Title  Patient  to report no falls within the past 4 weeks in order to show improved functional balance skills and reduced risk of fall-related injury    Baseline  06/08/19: No reports of falls, continues to feel unsteady and lightheadedness; 10/1: pt reports last fall 9/1 when carrying groceries (>4 weeks)    Status  Achieved      PT LONG TERM GOAL #3   Title  Patient to report resolution of dizziness and vertigo in order to improve QOL and assist in fall prevention    Baseline  06/08/19: No reports of falls, continues to feel unsteady and lightheadedness; 10/1: continues to be cautious, denies dizzines reports unsteadiness    Status  Partially Met      PT LONG TERM GOAL #4   Title  Patient to be able to state at least 3 concepts of energy conservation in order to show improved self-regulation skills and prevent patient from over-fatiguing throughout the day    Baseline  10/1: a-fib returning so SOB and endurance feel like they are worsening, but limiting time caring for family and running errands, taking breaks, and resting more    Status  Partially Met            Plan - 07/06/19 0857    Clinical Impression Statement  Pt due for reassessment this date. Pt with improvements in BLE strength and continues to demonstrate good strength throughout all joints. Functionally, pt able to rise from seated surfaces without difficulty and ambulating with normal gait pattern and no unsteadiness noted. Per DGI, pt with decreased speed when ambulating around objects and over objects. and minimal changes in gait speed with verbal cues. Pt continues to be limited with shortness of breath and dizziness sensation that has recently worsened about 2 weeks ago, but has also had increase in a-fib symptoms and doctor has confirmed her heart is out of rhythm and is trialing medication and possible second cardioversion to improve that. Pt is appropriate to d/c to HEP this date due to independent with HEP and able to continue progressing  at home with medical interventions pertaining to a-fib. Educated pt to retrieve new referral if needs arise in the future.    Personal Factors and Comorbidities  Fitness;Comorbidity 2;Social Background;Education;Time since onset of injury/illness/exacerbation    Comorbidities  obesity, A-fib    Examination-Activity Limitations  Transfers;Locomotion Level;Bed Mobility;Reach Overhead;Squat;Stairs;Stand    Examination-Participation Restrictions  Church;Yard Work;Cleaning;Community Activity;Interpersonal Relationship;Volunteer    Stability/Clinical Decision Making  Unstable/Unpredictable    Rehab Potential  Good    PT Frequency  2x / week    PT Duration  8 weeks    PT Treatment/Interventions  ADLs/Self Care Home Management;Canalith Repostioning;DME Instruction;Gait training;Stair training;Functional mobility training;Therapeutic activities;Therapeutic exercise;Balance training;Patient/family education;Neuromuscular re-education;Manual techniques;Vestibular;Taping;Energy conservation    PT Next Visit Plan  d/c to HEP    PT Home Exercise Plan  Eval: bridges, narrow BOS, partial tandem stance; 8/24: calf raises with UE support, STS without UE assist; 8/27: sidestepping and tandem stance with UE support; 9/10: vector stance forward/lateral/back with 5 sec  hold    Consulted and Agree with Plan of Care  Patient       Patient will benefit from skilled therapeutic intervention in order to improve the following deficits and impairments:  Abnormal gait, Decreased coordination, Difficulty walking, Decreased safety awareness, Dizziness, Obesity, Decreased activity tolerance, Impaired vision/preception, Decreased balance, Decreased mobility, Decreased strength  Visit Diagnosis: Other symptoms and signs involving the nervous system  Unsteadiness on feet  Repeated falls  Dizziness and giddiness     Problem List Patient Active Problem List   Diagnosis Date Noted  . Snoring 04/21/2019  . Varicose veins  of lower extremity 04/14/2019  . Cough 04/14/2019  . Atrial fibrillation ([place]) 04/14/2019  . DOE (dyspnea on exertion) 04/14/2019  . Disequilibrium 04/13/2019  . Ataxia 04/13/2019  . Hearing loss 04/13/2019  . Aphasia 04/13/2019  . Type 2 diabetes mellitus without complication, without long-term current use of insulin ([place]) 09/29/2018  . Second degree heart block 05/05/2018  . Urinary incontinence 05/05/2018  . Chronic low back pain without sciatica 05/05/2018  . Hypertension associated with diabetes ([place]) 09/20/2017  . Actinic keratosis 02/24/2017  . Dysthymia 01/10/2016  . History of endometrial cancer 02/05/2015    Class: Stage 1  . History of migraine headaches 03/18/2011  . Allergic rhinitis due to pollen 03/18/2011  . Obesity (BMI 30-39.9) 03/18/2011     Talbot Grumbling PT, DPT 07/06/19, 9:00 AM [facility] [address] [place], [name], [address] Phone: [phone]   Fax:  [phone]  Name: BRYCE KIMBLE MRN: [id number] Date of Birth: 1960/12/16

## 2019-07-10 ENCOUNTER — Ambulatory Visit (INDEPENDENT_AMBULATORY_CARE_PROVIDER_SITE_OTHER): Payer: Managed Care, Other (non HMO) | Admitting: Neurology

## 2019-07-10 DIAGNOSIS — I480 Paroxysmal atrial fibrillation: Secondary | ICD-10-CM

## 2019-07-10 DIAGNOSIS — Z72820 Sleep deprivation: Secondary | ICD-10-CM

## 2019-07-10 DIAGNOSIS — G4731 Primary central sleep apnea: Secondary | ICD-10-CM

## 2019-07-10 DIAGNOSIS — Z8669 Personal history of other diseases of the nervous system and sense organs: Secondary | ICD-10-CM

## 2019-07-10 DIAGNOSIS — E669 Obesity, unspecified: Secondary | ICD-10-CM

## 2019-07-10 DIAGNOSIS — G4719 Other hypersomnia: Secondary | ICD-10-CM

## 2019-07-10 DIAGNOSIS — G4726 Circadian rhythm sleep disorder, shift work type: Secondary | ICD-10-CM

## 2019-07-10 DIAGNOSIS — I152 Hypertension secondary to endocrine disorders: Secondary | ICD-10-CM

## 2019-07-10 DIAGNOSIS — I441 Atrioventricular block, second degree: Secondary | ICD-10-CM

## 2019-07-10 DIAGNOSIS — E1159 Type 2 diabetes mellitus with other circulatory complications: Secondary | ICD-10-CM

## 2019-07-11 ENCOUNTER — Encounter: Payer: Self-pay | Admitting: Neurology

## 2019-07-12 ENCOUNTER — Encounter: Payer: Self-pay | Admitting: Family Medicine

## 2019-07-12 ENCOUNTER — Other Ambulatory Visit: Payer: Self-pay | Admitting: Family Medicine

## 2019-07-12 DIAGNOSIS — I10 Essential (primary) hypertension: Secondary | ICD-10-CM

## 2019-07-13 ENCOUNTER — Telehealth: Payer: Self-pay | Admitting: *Deleted

## 2019-07-13 ENCOUNTER — Encounter: Payer: Self-pay | Admitting: Neurology

## 2019-07-13 ENCOUNTER — Telehealth: Payer: Self-pay | Admitting: Neurology

## 2019-07-13 ENCOUNTER — Other Ambulatory Visit: Payer: Self-pay | Admitting: Neurology

## 2019-07-13 ENCOUNTER — Other Ambulatory Visit: Payer: Self-pay

## 2019-07-13 DIAGNOSIS — G4719 Other hypersomnia: Secondary | ICD-10-CM | POA: Insufficient documentation

## 2019-07-13 DIAGNOSIS — Z72820 Sleep deprivation: Secondary | ICD-10-CM | POA: Insufficient documentation

## 2019-07-13 DIAGNOSIS — G4733 Obstructive sleep apnea (adult) (pediatric): Secondary | ICD-10-CM | POA: Insufficient documentation

## 2019-07-13 MED ORDER — MODAFINIL 200 MG PO TABS: 200.0000 mg | ORAL_TABLET | Freq: Every day | ORAL | 5 refills | Status: DC

## 2019-07-13 MED ORDER — APIXABAN 5 MG PO TABS: 5.0000 mg | ORAL_TABLET | Freq: Two times a day (BID) | ORAL | 3 refills | Status: DC

## 2019-07-13 NOTE — Telephone Encounter (Signed)
[employer] called and said they are considered out of network for the [employer].

## 2019-07-13 NOTE — Telephone Encounter (Signed)
Sending to [employer]

## 2019-07-13 NOTE — Telephone Encounter (Signed)
Submitted PA modafinil on CMM. Key: [id number]. PA Case ID: [id number] - Rx #: [id number]. Waiting on determination from [employer].

## 2019-07-13 NOTE — Addendum Note (Signed)
Addended by: Larey Seat on: 07/13/2019 01:11 PM   Modules accepted: Orders

## 2019-07-13 NOTE — Procedures (Signed)
PATIENT'S NAME:  Laurie Mckinney, Laurie Mckinney DOB:      03-15-1961      MR#:    [id number]     DATE OF RECORDING: 07/10/2019 REFERRING M.D.:  Sarina Ill, MD Study Performed:   Baseline Polysomnogram HISTORY:  58 y.o. year old Caucasian female patient seen upon a referral by Dr. Jaynee Eagles. MD on 06/06/2019.  Chief concern according to patient:" I have excessive daytime sleepiness" - "driving home in AM, I have run off the road, being sleepy".  Laurie Mckinney, a right -handed Caucasian female, has gained over 35 pounds in the last 78 month. She is a night shift Insurance underwriter.  She  has a medical history of Adjustment insomnia, Allergy, Asthma, Atrial flutter ([place]) (02/26/15), Bursitis, Diabetes mellitus without complication ([place]), Hypertension, Migraine headache, Mobitz type 2/ second degree heart block, and Obesity. She was diagnosed with DM less than one year ago. She was diagnosed with atrial fibrillation during a surgery for cervical cancer- Earlier last month she had balance problems, slurred speech and clumsy hands, but a Stroke work up was negative. Started Eliquis. Cardioversion on 20- August 2020 at [place], [place].     The patient had the first sleep study in the year 2016, a HST -negative for apnea.   Sleep relevant medical history: Nocturia, no tonsillectomy. Another family member with OSA is her son, who is 12 years old.    Social history: Patient is working as Chief Strategy Officer at night, Surveyor, quantity and lives in a household with 2 persons.  The patient works in shifts( night/ rotating) but has been out of work since July.  Naps are taken frequently, lasting from 30 to 90 minutes.  The patient endorsed the Epworth Sleepiness Scale at 21 points.   The patient's weight 278 pounds with a height of 72 (inches), resulting in a BMI of 37.6 kg/m2. The patient's neck circumference measured 17 inches.  CURRENT MEDICATIONS: Tylenol, Proventil, Eliquis, Wellbutrin, Zestoretic, Paxil, Antivert, Ditropan    PROCEDURE:  This is a multichannel digital polysomnogram utilizing the Somnostar 11.2 system.  Electrodes and sensors were applied and monitored per AASM Specifications.   EEG, EOG, Chin and Limb EMG, were sampled at 200 Hz.  ECG, Snore and Nasal Pressure, Thermal Airflow, Respiratory Effort, CPAP Flow and Pressure, Oximetry was sampled at 50 Hz. Digital video and audio were recorded.      BASELINE STUDY: Lights Out was at 21:46 and Lights On at 04:46.  Total recording time (TRT) was 420.5 minutes, with a total sleep time (TST) of 269 minutes.   The patient's sleep latency was 35 minutes.  REM latency was 75 minutes.  The sleep efficiency was 64.3 %.     SLEEP ARCHITECTURE: WASO (Wake after sleep onset) was 125.5 minutes.  There were 48 minutes in Stage N1, 85.5 minutes Stage N2, 70.5 minutes Stage N3 and 65 minutes in Stage REM.  The percentage of Stage N1 was 17.8%, Stage N2 was 31.8%, Stage N3 was 26.2% and Stage R (REM sleep) was 24.2%.     RESPIRATORY ANALYSIS:  There were a total of 159 respiratory events:  6 obstructive apneas, 2 central apneas and 41 mixed apneas and 110 hypopneas. The patient also had several respiratory event related arousals (RERAs).     The total APNEA/HYPOPNEA INDEX (AHI) was 35.5 /hour and the total RESPIRATORY DISTURBANCE INDEX was 40 /hour.  56 events occurred in REM sleep and 185 events in NREM.  The REM AHI was 51.7 /hour, versus a  non-REM AHI of 30.3. The patient spent 2.5 minutes of total sleep time in the supine position and 267 minutes in non-supine.  The supine AHI was 96.0/h versus a non-supine AHI of 34.9/h.  OXYGEN SATURATION & C02:  The Wake baseline 02 saturation was 96%, with the lowest being 77%. Time spent below 89% saturation equaled 35 minutes.   PERIODIC LIMB MOVEMENTS:  The patient had a total of 7 Periodic Limb Movements.  The Periodic Limb Movement (PLM) index was 1.6 and the PLM Arousal index was 0.4/hour. The arousals were noted as: 40 were  spontaneous, 2 were associated with PLMs, and 100 were associated with respiratory events. Audio and video analysis did not show any abnormal or unusual movements, behaviors, phonations or vocalizations.  Loud Snoring was noted. EKG with variable R to R intervals, seen on screen shots of epochs 252 and 780.   IMPRESSION: 1. Severe Mixed sleep apnea with mostly mixed type apneas, explaining the EDS symptoms. No MSLT was necessary. Apnea was influenced by supine sleep.  2. Mild Periodic Limb Movement Disorder (PLMD) 3. Primary Snoring. 4. Hypoxemia, intermittent - see screen shot Epoch 252. 5. Abnormal EKG(!) 6. Rather short sleep time- possibly related to shift work pattern/ circadian rhythm disorder.  RECOMMENDATIONS:Advise urgently CPAP therapy, either by autotitration or in a full-night, attended, CPAP titration study- to optimize therapy.  1. Fasted way will be an autotitration capable CPAP device, heated humidity and mask of choice, settings of 6-16 cm water, with 3 cm EPR. 2. Modafinil ordered for prn use at 200 mg as patient's sleepiness makes driving unsafe.     I certify that I have reviewed the entire raw data recording prior to the issuance of this report in accordance with the Standards of Accreditation of the American Academy of Sleep Medicine (AASM)  Larey Seat, MD  07-13-2019 Diplomat, American Board of Psychiatry and Neurology  Diplomat, American Board of [place] Director, [employer] [facility] [employer]

## 2019-07-13 NOTE — Telephone Encounter (Signed)
I called pt. I advised pt that Dr. Brett Fairy reviewed their sleep study results and found that pt has sleep apnea. Dr. Brett Fairy recommends that pt should start auto CPAP. I reviewed PAP compliance expectations with the pt. Pt is agreeable to starting a CPAP. I advised pt that an order will be sent to a DME, [facility], and [facility] will call the pt within about one week after they file with the pt's insurance. [facility] will show the pt how to use the machine, fit for masks, and troubleshoot the CPAP if needed. A follow up appt was made for insurance purposes with Debbora Presto, NP on Dec 7,2020 at 2:00 pm. Pt verbalized understanding to arrive 15 minutes early and bring their CPAP. A letter with all of this information in it will be mailed to the pt as a reminder. I verified with the pt that the address we have on file is correct. Pt verbalized understanding of results. Pt had no questions at this time but was encouraged to call back if questions arise. I have sent the order to [facility] and have received confirmation that they have received the order.

## 2019-07-13 NOTE — Telephone Encounter (Signed)
-----   Message from Larey Seat, MD sent at 07/13/2019  1:11 PM EDT ----- Loud Snoring was noted. EKG with variable R to R intervals, seen on screen shots of epochs 252 and 780.   IMPRESSION: 1. Severe Mixed sleep apnea with mostly mixed type apneas, explaining the EDS symptoms. No MSLT was necessary. Apnea was influenced by supine sleep.  2. Mild Periodic Limb Movement Disorder (PLMD) 3. Primary Snoring. 4. Hypoxemia, intermittent - see screen shot Epoch 252. 5. Abnormal EKG(!) 6. Rather short sleep time- possibly related to shift work pattern/ circadian rhythm disorder.  RECOMMENDATIONS:Advise urgently CPAP therapy, either by autotitration or in a full-night, attended, CPAP titration study- to optimize therapy.  1. Fasted way will be an autotitration capable CPAP device, heated humidity and mask of choice, settings of 6-16 cm water, with 3 cm EPR. 2. Modafinil ordered for prn use at 200 mg as patient's sleepiness makes driving unsafe.

## 2019-07-13 NOTE — Telephone Encounter (Signed)
PA approved from 07/13/2019-07/12/2020 Through [facility]

## 2019-07-13 NOTE — Telephone Encounter (Signed)
Called the pt to make her aware that he insurance

## 2019-07-14 ENCOUNTER — Other Ambulatory Visit: Payer: Self-pay

## 2019-07-14 ENCOUNTER — Inpatient Hospital Stay: Payer: Managed Care, Other (non HMO) | Attending: Gynecologic Oncology | Admitting: Gynecologic Oncology

## 2019-07-14 ENCOUNTER — Encounter: Payer: Self-pay | Admitting: Gynecologic Oncology

## 2019-07-14 DIAGNOSIS — Z7901 Long term (current) use of anticoagulants: Secondary | ICD-10-CM | POA: Diagnosis not present

## 2019-07-14 DIAGNOSIS — N941 Unspecified dyspareunia: Secondary | ICD-10-CM | POA: Insufficient documentation

## 2019-07-14 DIAGNOSIS — E119 Type 2 diabetes mellitus without complications: Secondary | ICD-10-CM | POA: Insufficient documentation

## 2019-07-14 DIAGNOSIS — Z79899 Other long term (current) drug therapy: Secondary | ICD-10-CM | POA: Insufficient documentation

## 2019-07-14 DIAGNOSIS — G4733 Obstructive sleep apnea (adult) (pediatric): Secondary | ICD-10-CM | POA: Diagnosis not present

## 2019-07-14 DIAGNOSIS — I10 Essential (primary) hypertension: Secondary | ICD-10-CM | POA: Insufficient documentation

## 2019-07-14 DIAGNOSIS — Z90710 Acquired absence of both cervix and uterus: Secondary | ICD-10-CM | POA: Insufficient documentation

## 2019-07-14 DIAGNOSIS — I4891 Unspecified atrial fibrillation: Secondary | ICD-10-CM | POA: Diagnosis not present

## 2019-07-14 DIAGNOSIS — C541 Malignant neoplasm of endometrium: Secondary | ICD-10-CM | POA: Diagnosis not present

## 2019-07-14 DIAGNOSIS — Z90722 Acquired absence of ovaries, bilateral: Secondary | ICD-10-CM | POA: Diagnosis not present

## 2019-07-14 DIAGNOSIS — J45909 Unspecified asthma, uncomplicated: Secondary | ICD-10-CM | POA: Diagnosis not present

## 2019-07-14 NOTE — Progress Notes (Signed)
FOLLOWUP VISIT ENDOMETRIAL CANCER Chief Complaint  Patient presents with  . endometrial cancer     Assessment:    58 y.o. year old with history of Stage IA Grade 1 endometrioid endometrial cancer.   S/p robotic hysterectomy, BSO on 02/05/15. Low risk features on pathology, therefore no adjuvant therapy recommended. No evidence for disease recurrence on today's exam.  Plan: Followup exam with Dr Ulanda Edison in April, 2021 and with me in October, 2021. Recommend continued 6 monthly surveillance until 2021.  HPI:  Laurie Mckinney is a 58 y.o. year old initially seen in consultation on 01/07/15 for complex atypical hyperplasia, referred by Dr Ulanda Edison.  She then underwent a robotic hysterectomy, BSO on [id number] without complications.  Her postoperative course was uncomplicated. An occult endometrial cancer was identified on final pathology.  Her final pathologic diagnosis is a Stage IA Grade 1 endometrioid endometrial cancer with no lymphovascular space invasion, 2/22 mm (10%) of myometrial invasion. Lymph nodes were not sampled.  She had bleeding from granulation tissue at the vaginal cuff removed in December 2016.  INTERVAL HX: She reported episodes of feeling weak and dizzy in July 2020.  She felt almost as though she were drunk.  She was evaluated with CT scan of the head and MRI of the head both of which were negative for evidence of either stroke or metastatic disease.  These were performed on April 19, 2019.  She was subsequently diagnosed with atrial fibrillation and obstructive sleep apnea which was thought to be contributing to her symptoms.  She denies other symptoms concerning for recurrence. She does endorse dyspareunia with thin vaginal skin and dryness.   No Known Allergies Current Outpatient Medications on File Prior to Visit  Medication Sig Dispense Refill  . acetaminophen (TYLENOL ARTHRITIS PAIN) 650 MG CR tablet Take 650 mg by mouth every 8 (eight) hours as needed for pain.    Marland Kitchen  albuterol (PROVENTIL HFA;VENTOLIN HFA) 108 (90 Base) MCG/ACT inhaler TAKE 2 PUFFS BY MOUTH EVERY 6 HOURS AS NEEDED FOR WHEEZE OR SHORTNESS OF BREATH 6.7 Inhaler 0  . apixaban (ELIQUIS) 5 MG TABS tablet Take 1 tablet (5 mg total) by mouth 2 (two) times daily. 180 tablet 3  . Apple Cider Vinegar 500 MG TABS Take 500 mg by mouth 2 (two) times daily.    . COLLAGEN PO Take by mouth.    Marland Kitchen lisinopril-hydrochlorothiazide (ZESTORETIC) 10-12.5 MG tablet TAKE 1 TABLET DAILY 90 tablet 3  . Multiple Vitamins-Minerals (MULTIVITAMIN WITH MINERALS) tablet Take 1 tablet by mouth every morning.      No current facility-administered medications on file prior to visit.    Past Medical History:  Diagnosis Date  . Adjustment insomnia    SHIFT WORK  . Allergy    RHINITIS  . Asthma   . Atrial flutter [facility]) 02/26/15   [facility] [place]  . Bursitis    right hip  . Diabetes mellitus without complication ([place])   . Hypertension   . Migraine headache   . Mobitz type 2 second degree heart block   . Obesity    Past Surgical History:  Procedure Laterality Date  . ABDOMINAL HYSTERECTOMY    . CARDIOVERSION N/A 05/25/2019   Procedure: CARDIOVERSION;  Surgeon: Arnoldo Lenis, MD;  Location: [facility];  Service: Endoscopy;  Laterality: N/A;  . COLONOSCOPY  2011   Dr.Brodie  . ROBOTIC ASSISTED TOTAL HYSTERECTOMY WITH BILATERAL SALPINGO OOPHERECTOMY Bilateral 02/05/2015   Procedure: ROBOTIC ASSISTED TOTAL LAPAROSCOPIC HYSTERECTOMY WITH BILATERAL SALPINGO  OOPHORECTOMY;  Surgeon: Everitt Amber, MD;  Location: [facility];  Service: Gynecology;  Laterality: Bilateral;   Family History  Problem Relation Age of Onset  . Arthritis Mother   . Heart disease Father    Social History   Socioeconomic History  . Marital status: Married    Spouse name: Not on file  . Number of children: Not on file  . Years of education: Not on file  . Highest education level: Not on file  Occupational History  . Not on file  Social  Needs  . Financial resource strain: Not on file  . Food insecurity    Worry: Not on file    Inability: Not on file  . Transportation needs    Medical: Not on file    Non-medical: Not on file  Tobacco Use  . Smoking status: Never Smoker  . Smokeless tobacco: Never Used  Substance and Sexual Activity  . Alcohol use: No  . Drug use: No  . Sexual activity: Yes  Lifestyle  . Physical activity    Days per week: Not on file    Minutes per session: Not on file  . Stress: Not on file  Relationships  . Social Herbalist on phone: Not on file    Gets together: Not on file    Attends religious service: Not on file    Active member of club or organization: Not on file    Attends meetings of clubs or organizations: Not on file    Relationship status: Not on file  . Intimate partner violence    Fear of current or ex partner: Not on file    Emotionally abused: Not on file    Physically abused: Not on file    Forced sexual activity: Not on file  Other Topics Concern  . Not on file  Social History Narrative  . Not on file     Review of systems: Constitutional:  She has no weight gain or weight loss. She has no fever or chills. Eyes: No blurred vision Ears, Nose, Mouth, Throat: No dizziness, headaches or changes in hearing. No mouth sores. Cardiovascular: No chest pain, palpitations or edema. Respiratory:  No shortness of breath, wheezing or cough Gastrointestinal: She has normal bowel movements without diarrhea or constipation. She denies any nausea or vomiting. She denies blood in her stool or heart burn. Genitourinary:  She denies pelvic pain, pelvic pressure. She has no hematuria, dysuria, or incontinence. She has no irregular vaginal bleeding or vaginal discharge. + urinary incontinence. Musculoskeletal: Denies muscle weakness or joint pains.  Skin:  She has no skin changes, rashes or itching Neurological:  Denies dizziness or headaches. No neuropathy, no numbness or  tingling. Psychiatric:  She denies depression or anxiety. Hematologic/Lymphatic:   No easy bruising or bleeding   Physical Exam: Last menstrual period 01/26/2011. General: Well dressed, well nourished in no apparent distress.   HEENT:  Normocephalic and atraumatic, no lesions.  Extraocular muscles intact. Sclerae anicteric. Pupils equal, round, reactive. No mouth sores or ulcers. Thyroid is normal size, not nodular, midline. Skin:  No lesions or rashes. Breasts:  deferred Lungs: deferred Cardiovascular:  deferred Abdomen:  Soft, nontender, nondistended.  No palpable masses.  No hepatosplenomegaly.  No ascites. Normal bowel sounds.  No hernias.  Incisions are well healed Genitourinary: Normal EGBUS  Vaginal cuff intact.  No bleeding or discharge.  No cul de sac fullness. Normal cuff. Extremities: No cyanosis, clubbing or edema.  No calf tenderness  or erythema. No palpable cords. Psychiatric: Mood and affect are appropriate. Neurological: Awake, alert and oriented x 3. Sensation is intact, no neuropathy.  Musculoskeletal: No pain, normal strength and range of motion.  Thereasa Solo, MD

## 2019-07-14 NOTE — Patient Instructions (Signed)
Please notify Dr Denman George at phone number [phone] if you notice vaginal bleeding, new pelvic or abdominal pains, bloating, feeling full easy, or a change in bladder or bowel function.   Please contact Dr Serita Grit office (at [phone]) in July, 2021 to request an appointment with her for October, 2021.

## 2019-07-21 ENCOUNTER — Encounter: Payer: Self-pay | Admitting: Internal Medicine

## 2019-07-21 ENCOUNTER — Encounter: Payer: Self-pay | Admitting: Gastroenterology

## 2019-07-29 ENCOUNTER — Encounter (INDEPENDENT_AMBULATORY_CARE_PROVIDER_SITE_OTHER): Payer: Self-pay

## 2019-08-01 ENCOUNTER — Telehealth: Payer: Self-pay | Admitting: Cardiology

## 2019-08-01 NOTE — Telephone Encounter (Signed)
Asking for clearance for patient.  Stated that they have faxed over several times

## 2019-08-02 ENCOUNTER — Encounter: Payer: Self-pay | Admitting: Internal Medicine

## 2019-08-02 ENCOUNTER — Ambulatory Visit (INDEPENDENT_AMBULATORY_CARE_PROVIDER_SITE_OTHER): Payer: Managed Care, Other (non HMO) | Admitting: Internal Medicine

## 2019-08-02 ENCOUNTER — Other Ambulatory Visit: Payer: Self-pay

## 2019-08-02 VITALS — BP 116/82 | HR 57 | Temp 96.9°F | Ht 71.0 in | Wt 287.0 lb

## 2019-08-02 DIAGNOSIS — I4819 Other persistent atrial fibrillation: Secondary | ICD-10-CM

## 2019-08-02 NOTE — Patient Instructions (Signed)
Medication Instructions:  Your physician recommends that you continue on your current medications as directed. Please refer to the Current Medication list given to you today.  *If you need a refill on your cardiac medications before your next appointment, please call your pharmacy*  Lab Work: NONE   If you have labs (blood work) drawn today and your tests are completely normal, you will receive your results only by: Marland Kitchen MyChart Message (if you have MyChart) OR . A paper copy in the mail If you have any lab test that is abnormal or we need to change your treatment, we will call you to review the results.  Testing/Procedures: Referral to Dr. [name] for A- Fib ablation    Follow-Up: At [facility], you and your health needs are our priority.  As part of our continuing mission to provide you with exceptional heart care, we have created designated Provider Care Teams.  These Care Teams include your primary Cardiologist (physician) and Advanced Practice Providers (APPs -  Physician Assistants and Nurse Practitioners) who all work together to provide you with the care you need, when you need it.  Your next appointment:   12 months  The format for your next appointment:   In Person  Provider:   Cristopher Peru, MD  Other Instructions Thank you for choosing [facility]!

## 2019-08-02 NOTE — Telephone Encounter (Signed)
Ill sign and send, I filled this out last week not sure what happened to it. Giving it to [facility] now  Zandra Abts MD

## 2019-08-02 NOTE — Telephone Encounter (Signed)
Printed office note and faxed clearance. Placed in surgical clearance folder.

## 2019-08-02 NOTE — Progress Notes (Signed)
HPI [name] is referred today by Dr. Harl Bowie for evaluation of persistent atrial fib. She is a morbidly obese 58 yo woman who developed atrial fib several months ago. She had remote post op atrial flutter in 2016. The patient did well until presenting in July with symptomatic atrial fib. She has been treated with anti-coagulation and undergone DCCV. She has had ERAF. She has a corrected QT of around 450 and a PR of 380. The patient has not had syncope. She is symptomatic in atrial fib. She is morbidly obese and has been gaining weight. She snores and has been diagnosed with sleep apnea and has been on CPAP for a week.  No Known Allergies   Current Outpatient Medications  Medication Sig Dispense Refill  . acetaminophen (TYLENOL ARTHRITIS PAIN) 650 MG CR tablet Take 650 mg by mouth every 8 (eight) hours as needed for pain.    Marland Kitchen albuterol (PROVENTIL HFA;VENTOLIN HFA) 108 (90 Base) MCG/ACT inhaler TAKE 2 PUFFS BY MOUTH EVERY 6 HOURS AS NEEDED FOR WHEEZE OR SHORTNESS OF BREATH 6.7 Inhaler 0  . apixaban (ELIQUIS) 5 MG TABS tablet Take 1 tablet (5 mg total) by mouth 2 (two) times daily. 180 tablet 3  . Apple Cider Vinegar 500 MG TABS Take 500 mg by mouth 2 (two) times daily.    . COLLAGEN PO Take by mouth.    Marland Kitchen lisinopril-hydrochlorothiazide (ZESTORETIC) 10-12.5 MG tablet TAKE 1 TABLET DAILY 90 tablet 3  . Multiple Vitamins-Minerals (MULTIVITAMIN WITH MINERALS) tablet Take 1 tablet by mouth every morning.      No current facility-administered medications for this visit.      Past Medical History:  Diagnosis Date  . Adjustment insomnia    SHIFT WORK  . Allergy    RHINITIS  . Asthma   . Atrial flutter [facility]) 02/26/15   [facility] [place]  . Bursitis    right hip  . Diabetes mellitus without complication ([place])   . Hypertension   . Migraine headache   . Mobitz type 2 second degree heart block   . Obesity     ROS:   All systems reviewed and negative except as noted in  the HPI.   Past Surgical History:  Procedure Laterality Date  . ABDOMINAL HYSTERECTOMY    . CARDIOVERSION N/A 05/25/2019   Procedure: CARDIOVERSION;  Surgeon: Arnoldo Lenis, MD;  Location: [facility];  Service: Endoscopy;  Laterality: N/A;  . COLONOSCOPY  2011   Dr.Brodie  . ROBOTIC ASSISTED TOTAL HYSTERECTOMY WITH BILATERAL SALPINGO OOPHERECTOMY Bilateral 02/05/2015   Procedure: ROBOTIC ASSISTED TOTAL LAPAROSCOPIC HYSTERECTOMY WITH BILATERAL SALPINGO OOPHORECTOMY;  Surgeon: Everitt Amber, MD;  Location: [facility];  Service: Gynecology;  Laterality: Bilateral;     Family History  Problem Relation Age of Onset  . Arthritis Mother   . Heart disease Father      Social History   Socioeconomic History  . Marital status: Married    Spouse name: Not on file  . Number of children: Not on file  . Years of education: Not on file  . Highest education level: Not on file  Occupational History  . Not on file  Social Needs  . Financial resource strain: Not on file  . Food insecurity    Worry: Not on file    Inability: Not on file  . Transportation needs    Medical: Not on file    Non-medical: Not on file  Tobacco Use  . Smoking status: Never  Smoker  . Smokeless tobacco: Never Used  Substance and Sexual Activity  . Alcohol use: No  . Drug use: No  . Sexual activity: Yes  Lifestyle  . Physical activity    Days per week: Not on file    Minutes per session: Not on file  . Stress: Not on file  Relationships  . Social Herbalist on phone: Not on file    Gets together: Not on file    Attends religious service: Not on file    Active member of club or organization: Not on file    Attends meetings of clubs or organizations: Not on file    Relationship status: Not on file  . Intimate partner violence    Fear of current or ex partner: Not on file    Emotionally abused: Not on file    Physically abused: Not on file    Forced sexual activity: Not on file  Other Topics Concern   . Not on file  Social History Narrative  . Not on file     BP 116/82   Pulse (!) 57   Temp (!) 96.9 F (36.1 C) (Temporal)   Ht 5\' 11"  (1.803 m)   Wt 287 lb (130.2 kg)   LMP 01/26/2011   SpO2 97%   BMI 40.03 kg/m   Physical Exam:  Well appearing NAD HEENT: Unremarkable Neck:  No JVD, no thyromegally Lymphatics:  No adenopathy Back:  No CVA tenderness Lungs:  Clear with no wheezes HEART:  Regular rate rhythm, no murmurs, no rubs, no clicks Abd:  soft, positive bowel sounds, no organomegally, no rebound, no guarding Ext:  2 plus pulses, no edema, no cyanosis, no clubbing Skin:  No rashes no nodules Neuro:  CN II through XII intact, motor grossly intact  EKG - reviewed  Assess/Plan: 1. Persistent atrial fib - her options for treatment are limited. She is not a great candidate for catheter ablation but I think that this is her best option. She has significant conduction system disease making most AA drug therapy relatively contra-indicated. Her QT is really too long for dofetilide. Rate control is an option but she clearly feels better in rhythm. I have recommended a referral to Dr. [name]. 2. Morbid obesity - I explained that her obesity is really a big issue and successful return to rhythym is predicated on her ability to keep weight off post ablation. Whether she will need to lose weight before an ablation I will defer to Dr. [name]. 3. AV block - she has long first degree AV block and AVWB. Her rates in atrial fib appear to be controlled. We will avoid AV nodal blocking agents presently.  Mikle Bosworth.D.

## 2019-08-04 ENCOUNTER — Encounter: Payer: Self-pay | Admitting: Family Medicine

## 2019-08-11 ENCOUNTER — Other Ambulatory Visit: Payer: Self-pay | Admitting: Family Medicine

## 2019-09-04 ENCOUNTER — Telehealth: Payer: Self-pay | Admitting: Family Medicine

## 2019-09-04 NOTE — Telephone Encounter (Signed)
I called patient and LVM regarding rescheduling 12/7 appt d/t Np out. Left patient my direct line and requested she call back to reschedule.

## 2019-09-05 ENCOUNTER — Encounter: Payer: Self-pay | Admitting: Cardiology

## 2019-09-05 ENCOUNTER — Ambulatory Visit (INDEPENDENT_AMBULATORY_CARE_PROVIDER_SITE_OTHER): Payer: Managed Care, Other (non HMO) | Admitting: Cardiology

## 2019-09-05 ENCOUNTER — Other Ambulatory Visit: Payer: Self-pay

## 2019-09-05 VITALS — BP 144/92 | HR 56 | Ht 71.0 in | Wt 287.0 lb

## 2019-09-05 DIAGNOSIS — Z01812 Encounter for preprocedural laboratory examination: Secondary | ICD-10-CM

## 2019-09-05 DIAGNOSIS — I4819 Other persistent atrial fibrillation: Secondary | ICD-10-CM

## 2019-09-05 NOTE — Addendum Note (Signed)
Addended by: Stanton Kidney on: 09/05/2019 09:55 AM   Modules accepted: Orders

## 2019-09-05 NOTE — Patient Instructions (Addendum)
Medication Instructions:  Your physician recommends that you continue on your current medications as directed. Please refer to the Current Medication list given to you today.  *If you need a refill on your cardiac medications before your next appointment, please call your pharmacy.  Labwork: You will get lab work on 10/24/19 If you have labs (blood work) drawn today and your tests are completely normal, you will receive your results only by:  Laurie Mckinney (if you have MyChart) OR  A paper copy in the mail If you have any lab test that is abnormal or we need to change your treatment, we will call you to review the results.  Testing/Procedures: Your physician has requested that you have cardiac CT within 7 days prior to your ablation. Cardiac computed tomography (CT) is a painless test that uses an x-ray machine to take clear, detailed pictures of your heart. For further information please visit [email or web address]. Please follow instruction below located under special instructions. You will get a call from our office to schedule the date for this test.  Your physician has recommended that you have an ablation. Catheter ablation is a medical procedure used to treat some cardiac arrhythmias (irregular heartbeats). During catheter ablation, a long, thin, flexible tube is put into a blood vessel in your groin (upper thigh), or neck. This tube is called an ablation catheter. It is then guided to your heart through the blood vessel. Radio frequency waves destroy small areas of heart tissue where abnormal heartbeats may cause an arrhythmia to start. Please see the instructions below located under special instructions  Follow-Up: You are scheduled for a phone visit on 10/16/2019 @ 2:15 pm  Your physician recommends that you schedule a follow-up appointment in: 4 weeks, after your procedure on 10/27/19, with [name] NP in the AFib clinic.  Your physician recommends that you schedule a follow up  appointment in: 3 months, after your procedure on 10/27/19, with Dr. [name].  *Please note that any paperwork needing to be filled out by the provider will need to be addressed at the front desk prior to seeing the provider. Please note that any FMLA, disability or other documents regarding health condition is subject to a $25.00 charge that must be received prior to completion of paperwork in the form of a money order or check.  Thank you for choosing [facility]!! [name], RN [phone]   Any Other Special Instructions Will Be Listed Below      Electrophysiology/Ablation Procedure Instructions   You are scheduled for a(n)  ablation on 10/27/2019 with Dr. [name].   1.   Pre procedure testing-             A.  LAB WORK --- On 10/24/19 at 8:30 am you will first go to the [facility] office (see address at the top of this letter)  for your pre procedure blood work.                 B. COVID TEST-- On 10/24/19 @ 9:00 am,  after you get your lab work- You will go to [facility] hospital ([place]) for your Covid testing.   This is a drive thru test site.  There will be multiple testing areas.  Be sure to share with the first checkpoint that you are there for pre-procedure/surgery testing. This will put you into the right (yellow) lane that leads to the PAT testing team. Stay in your car and the nurse team will  come to your car to test you.  After you are tested please go home and self quarantine until the day of your procedure.     2. On the day of your procedure 10/27/2019 you will go to [facility] [phone]) at 5:30 am.  [name] will go to the main entrance A [employer]) and enter where the [employer] are.  Your driver will drop you off and you will head down the hallway to ADMITTING.  You may have one support person come in to the hospital with you.  They will be asked to wait in the waiting room.   3.   Do not eat or drink after midnight  prior to your procedure.   4.   Do NOT take any medications the morning of your procedure.   5.  Plan for an overnight stay.  If you use your phone frequently bring your phone charger.   6. You will follow up with the AFIB clinic 4 weeks after your procedure.  You will follow up with Dr. [name]  3 months after your procedure.  These appointments will be made for you.   * If you have ANY questions please call the office [phone] and ask for [name] RN or send me a MyChart message   * Occasionally, EP Studies and ablations can become lengthy.  Please make your family aware of this before your procedure starts.  Average time ranges from 2-8 hours for EP studies/ablations.  Your physician will call your family after the procedure with the results.                                    CT INSTRUCTIONS Your cardiac CT will be scheduled at:  [facility] [address] [place], [place] [address] [phone]  Please arrive at the [facility] main entrance of [facility] at __________ on __________. Please arrive 30-45 minutes prior to test start time. Proceed to the [facility] Radiology Department (first floor) to check-in and test prep.  Please follow these instructions carefully (unless otherwise directed):  On the Night Before the Test:  Be sure to Drink plenty of water.  Do not consume any caffeinated/decaffeinated beverages or chocolate 12 hours prior to your test.  Do not take any antihistamines 12 hours prior to your test.   On the Day of the Test:  Drink plenty of water. Do not drink any water within one hour of the test.  Do not eat any food 4 hours prior to the test.  You may take your regular medications prior to the test.   Take metoprolol (Lopressor) two hours prior to test.  HOLD Furosemide/Hydrochlorothiazide morning of the test.  FEMALES- please wear underwire-free bra if available  After the Test:  Drink plenty of water.  After  receiving IV contrast, you may experience a mild flushed feeling. This is normal.  On occasion, you may experience a mild rash up to 24 hours after the test. This is not dangerous. If this occurs, you can take Benadryl 25 mg and increase your fluid intake.  If you experience trouble breathing, this can be serious. If it is severe call 911 IMMEDIATELY. If it is mild, please call our office.  Please contact the cardiac imaging nurse navigator should you have any questions/concerns Marchia Bond, RN Navigator Cardiac Imaging [facility]  [phone] Office  [phone] Cell      Cardiac Ablation Cardiac ablation is a procedure to disable (ablate) a small amount of heart tissue in very specific places. The heart has many electrical connections. Sometimes these connections are abnormal and can cause the heart to beat very fast or irregularly. Ablating some of the problem areas can improve the heart rhythm or return it to normal. Ablation may be done for people who:  Have Wolff-Parkinson-White syndrome.  Have fast heart rhythms (tachycardia).  Have taken medicines for an abnormal heart rhythm (arrhythmia) that were not effective or caused side effects.  Have a high-risk heartbeat that may be life-threatening. During the procedure, a small incision is made in the neck or the groin, and a long, thin, flexible tube (catheter) is inserted into the incision and moved to the heart. Small devices (electrodes) on the tip of the catheter will send out electrical currents. A type of X-ray (fluoroscopy) will be used to help guide the catheter and to provide images of the heart. Tell a health care provider about:  Any allergies you have.  All medicines you are taking, including vitamins, herbs, eye drops, creams, and over-the-counter medicines.  Any problems you or family members have had with anesthetic medicines.  Any blood disorders you have.  Any surgeries you have  had.  Any medical conditions you have, such as kidney failure.  Whether you are pregnant or may be pregnant. What are the risks? Generally, this is a safe procedure. However, problems may occur, including:  Infection.  Bruising and bleeding at the catheter insertion site.  Bleeding into the chest, especially into the sac that surrounds the heart. This is a serious complication.  Stroke or blood clots.  Damage to other structures or organs.  Allergic reaction to medicines or dyes.  Need for a permanent pacemaker if the normal electrical system is damaged. A pacemaker is a small computer that sends electrical signals to the heart and helps your heart beat normally.  The procedure not being fully effective. This may not be recognized until months later. Repeat ablation procedures are sometimes required. What happens before the procedure?  Follow instructions from your health care provider about eating or drinking restrictions.  Ask your health care provider about: ? Changing or stopping your regular medicines. This is especially important if you are taking diabetes medicines or blood thinners. ? Taking medicines such as aspirin and ibuprofen. These medicines can thin your blood. Do not take these medicines before your procedure if your health care provider instructs you not to.  Plan to have someone take you home from the hospital or clinic.  If you will be going home right after the procedure, plan to have someone with you for 24 hours. What happens during the procedure?  To lower your risk of infection: ? Your health care team will wash or sanitize their hands. ? Your skin will be washed with soap. ? Hair may be removed from the incision area.  An IV tube will be inserted into one of your veins.  You will be given a medicine to help you relax (sedative).  The skin on your neck or groin will be numbed.  An incision will be made in your neck or your groin.  A needle will  be inserted through the incision and into a large vein in your neck or groin.  A catheter will be inserted into the needle and moved to your heart.  Dye may be injected through the  catheter to help your surgeon see the area of the heart that needs treatment.  Electrical currents will be sent from the catheter to ablate heart tissue in desired areas. There are three types of energy that may be used to ablate heart tissue: ? Heat (radiofrequency energy). ? Laser energy. ? Extreme cold (cryoablation).  When the necessary tissue has been ablated, the catheter will be removed.  Pressure will be held on the catheter insertion area to prevent excessive bleeding.  A bandage (dressing) will be placed over the catheter insertion area. The procedure may vary among health care providers and hospitals. What happens after the procedure?  Your blood pressure, heart rate, breathing rate, and blood oxygen level will be monitored until the medicines you were given have worn off.  Your catheter insertion area will be monitored for bleeding. You will need to lie still for a few hours to ensure that you do not bleed from the catheter insertion area.  Do not drive for 24 hours or as long as directed by your health care provider. Summary  Cardiac ablation is a procedure to disable (ablate) a small amount of heart tissue in very specific places. Ablating some of the problem areas can improve the heart rhythm or return it to normal.  During the procedure, electrical currents will be sent from the catheter to ablate heart tissue in desired areas. This information is not intended to replace advice given to you by your health care provider. Make sure you discuss any questions you have with your health care provider. Document Released: 02/07/2009 Document Revised: 03/14/2018 Document Reviewed: 08/10/2016 Elsevier Patient Education  2020 [employer]

## 2019-09-05 NOTE — Progress Notes (Signed)
Electrophysiology Office Note   Date:  09/05/2019   ID:  Laurie, Mckinney 02/19/1961, MRN [id number]  PCP:  Denita Lung, MD  Cardiologist:  Harl Bowie Primary Electrophysiologist: Gaye Alken, MD    Chief Complaint: AF   History of Present Illness: Laurie Mckinney is a 58 y.o. female who is being seen today for the evaluation of AF at the request of Laurie Mckinney. Presenting today for electrophysiology evaluation.  She has a history of atrial flutter, hypertension, Mobitz 2 AV block, and atrial fibrillation.  She had a recent cardioversion, but unfortunately had a rash.  She has sleep apnea and is on CPAP currently.  Her main symptoms from her atrial fibrillation are weakness, fatigue, shortness of breath.  She says that these have been occurring for the last few months.  Today, she denies symptoms of palpitations, chest pain, orthopnea, PND, lower extremity edema, claudication, dizziness, presyncope, syncope, bleeding, or neurologic sequela. The patient is tolerating medications without difficulties.    Past Medical History:  Diagnosis Date  . Adjustment insomnia    SHIFT WORK  . Allergy    RHINITIS  . Asthma   . Atrial flutter [facility]) 02/26/15   [facility] [place]  . Bursitis    right hip  . Diabetes mellitus without complication ([facility])   . Hypertension   . Migraine headache   . Mobitz type 2 second degree heart block   . Obesity    Past Surgical History:  Procedure Laterality Date  . ABDOMINAL HYSTERECTOMY    . CARDIOVERSION N/A 05/25/2019   Procedure: CARDIOVERSION;  Surgeon: Laurie Lenis, MD;  Location: [facility];  Service: Endoscopy;  Laterality: N/A;  . COLONOSCOPY  2011   Dr.Brodie  . ROBOTIC ASSISTED TOTAL HYSTERECTOMY WITH BILATERAL SALPINGO OOPHERECTOMY Bilateral 02/05/2015   Procedure: ROBOTIC ASSISTED TOTAL LAPAROSCOPIC HYSTERECTOMY WITH BILATERAL SALPINGO OOPHORECTOMY;  Surgeon: Laurie Amber, MD;  Location: [facility];  Service:  Gynecology;  Laterality: Bilateral;     Current Outpatient Medications  Medication Sig Dispense Refill  . acetaminophen (TYLENOL ARTHRITIS PAIN) 650 MG CR tablet Take 650 mg by mouth every 8 (eight) hours as needed for pain.    Marland Kitchen albuterol (PROVENTIL HFA;VENTOLIN HFA) 108 (90 Base) MCG/ACT inhaler TAKE 2 PUFFS BY MOUTH EVERY 6 HOURS AS NEEDED FOR WHEEZE OR SHORTNESS OF BREATH 6.7 Inhaler 0  . apixaban (ELIQUIS) 5 MG TABS tablet Take 1 tablet (5 mg total) by mouth 2 (two) times daily. 180 tablet 3  . Apple Cider Vinegar 500 MG TABS Take 500 mg by mouth 2 (two) times daily.    Marland Kitchen buPROPion (WELLBUTRIN SR) 100 MG 12 hr tablet Take 100 mg by mouth every morning.    . COLLAGEN PO Take by mouth.    Marland Kitchen lisinopril-hydrochlorothiazide (ZESTORETIC) 10-12.5 MG tablet TAKE 1 TABLET DAILY 90 tablet 3  . modafinil (PROVIGIL) 200 MG tablet Take 200 mg by mouth daily.    . Multiple Vitamins-Minerals (MULTIVITAMIN WITH MINERALS) tablet Take 1 tablet by mouth every morning.     Marland Kitchen PARoxetine (PAXIL) 20 MG tablet TAKE 1 TABLET DAILY 90 tablet 0   No current facility-administered medications for this visit.     Allergies:   Patient has no known allergies.   Social History:  The patient  reports that she has never smoked. She has never used smokeless tobacco. She reports that she does not drink alcohol or use drugs.   Family History:  The patient's family history includes  Arthritis in her mother; Heart disease in her father.    ROS:  Please see the history of present illness.   Otherwise, review of systems is positive for none.   All other systems are reviewed and negative.    PHYSICAL EXAM: VS:  BP (!) 144/92   Pulse (!) 56   Ht 5\' 11"  (1.803 m)   Wt 287 lb (130.2 kg)   LMP 01/26/2011   SpO2 96%   BMI 40.03 kg/m  , BMI Body mass index is 40.03 kg/m. GEN: Well nourished, well developed, in no acute distress  HEENT: normal  Neck: no JVD, carotid bruits, or masses Cardiac: irregular; no murmurs,  rubs, or gallops,no edema  Respiratory:  clear to auscultation bilaterally, normal work of breathing GI: soft, nontender, nondistended, + BS MS: no deformity or atrophy  Skin: warm and dry Neuro:  Strength and sensation are intact Psych: euthymic mood, full affect  EKG:  EKG is ordered today. Personal review of the ekg ordered shows atrial fibrillation, rate 56  Recent Labs: 04/14/2019: ALT 19; TSH 0.600 04/30/2019: Hemoglobin 14.5; Platelets 282 05/23/2019: BUN 11; Creatinine, Ser 0.66; Potassium 3.9; Sodium 138    Lipid Panel     Component Value Date/Time   CHOL 131 05/05/2018 1448   TRIG 56 05/05/2018 1448   HDL 52 05/05/2018 1448   CHOLHDL 2.5 05/05/2018 1448   CHOLHDL 2.5 01/21/2017 0921   VLDL 11 01/21/2017 0921   LDLCALC 68 05/05/2018 1448     Wt Readings from Last 3 Encounters:  09/05/19 287 lb (130.2 kg)  08/02/19 287 lb (130.2 kg)  06/30/19 280 lb (127 kg)      Other studies Reviewed: Additional studies/ records that were reviewed today include: TTE 06/07/19  Review of the above records today demonstrates:   1. The left ventricle has normal systolic function, with an ejection fraction of 55-60%. The cavity size was normal. Left ventricular diastolic Doppler parameters are indeterminate.  2. The right ventricle has normal systolic function. The cavity was normal. There is no increase in right ventricular wall thickness.  3. Left atrial size was mildly dilated.  4. No evidence of mitral valve stenosis.  5. The aortic valve has an indeterminate number of cusps. No stenosis of the aortic valve.  6. The aorta is normal unless otherwise noted.  7. The aortic root is normal in size and structure.  8. Pulmonary hypertension is indeterminant, inadequate TR jet.  9. The interatrial septum was not well visualized.   ASSESSMENT AND PLAN:  1.  Persistent atrial fibrillation: Unfortunately further therapies are limited.  He has significant conduction system disease and  thus antiarrhythmics are not the best options.  We did discuss ablation.  Risks include bleeding, tamponade, heart block, stroke, damage surrounding organs.  He understands these risks and is agreed to the procedure.  2.  Morbid obesity: Diet and exercise encouraged  3.  Mobitz 1 AV block: Avoiding AV nodal blockers.  A. fib rates are well controlled.  4.  Obstructive sleep apnea: CPAP compliance encouraged.  5.  Hypertension: Blood pressure significantly elevated today.  We Chyla Schlender increase her lisinopril hydrochlorothiazide to 20-25 mg.  Current medicines are reviewed at length with the patient today.   The patient does not have concerns regarding her medicines.  The following changes were made today: Increase lisinopril hydrochlorothiazide  Labs/ tests ordered today include:  Orders Placed This Encounter  Procedures  . EKG 12-Lead   Case discussed with referring  Disposition:  FU with Kazim Corrales 3 months  Signed, Corisa Montini Meredith Leeds, MD  09/05/2019 9:30 AM     [facility] [address] [place] [address] [phone] (office) [phone] (fax)

## 2019-09-06 ENCOUNTER — Telehealth (INDEPENDENT_AMBULATORY_CARE_PROVIDER_SITE_OTHER): Payer: Managed Care, Other (non HMO) | Admitting: Adult Health

## 2019-09-06 ENCOUNTER — Encounter: Payer: Self-pay | Admitting: Adult Health

## 2019-09-06 DIAGNOSIS — I480 Paroxysmal atrial fibrillation: Secondary | ICD-10-CM | POA: Diagnosis not present

## 2019-09-06 DIAGNOSIS — G4739 Other sleep apnea: Secondary | ICD-10-CM | POA: Diagnosis not present

## 2019-09-06 DIAGNOSIS — Z9989 Dependence on other enabling machines and devices: Secondary | ICD-10-CM

## 2019-09-06 NOTE — Progress Notes (Signed)
PATIENT: Laurie Mckinney DOB: 1961-07-23  [facility] provider: Dr. Brett Fairy REASON FOR VISIT: Initial CPAP compliance visit  HISTORY FROM: patient  Virtual Visit via Video Note  I connected with Laurie Mckinney on 09/06/19 at  2:15 PM EST by a video enabled telemedicine application located at [facility] and verified that I am speaking with the correct person using two identifiers who was located at their own home.   I discussed the limitations of evaluation and management by telemedicine and the availability of in person appointments. The patient expressed understanding and agreed to proceed.    HISTORY OF PRESENT ILLNESS: HISTORY   [name] is a 58 year old female who is being seen today via virtual visit for initial CPAP compliance visit.  She was initially seen by Dr. Brett Fairy on 06/06/2019 due to new diagnosis of atrial fibrillation and excessive daytime fatigue.  She underwent sleep study on 07/10/2019 which showed severe mixed sleep apnea and recommended initiation of CPAP.  Download report reviewed from 08/07/2019 -09/05/2019 which showed 30 out of 30 usage days and 29 days greater than 4 hours for 97% compliance.  Average usage 8 hours and 53 minutes with residual AHI 3.7.  Leaks in the 95th percentile 24.5 L/min.  Pressure in the 95th percentile 12.4 cm H2O with min pressure 6 cm H2O and max pressure 16 cm H2O with EPR level 3.  She reports great benefit with use of CPAP with overall improvement of energy level and benefit regarding atrial fibrillation.  She has been able to improve tolerance with mask and is able to sleep comfortably.  She was also started on modafinil due to shift work fatigue and falling asleep while driving home from work in the morning.  She is prescribed modafinil 200 mg and will take half a pill prior to work and a half a pill prior to driving home.  This regimen has been working well for her and feels much more alert while driving.  She denies  any side effects. Continues to follow up with cardiology regularly for atrial fibrillation. Continues on Eliquis 5mg  twice daily without bleeding or bruising.  No concerns at this time.    REVIEW OF SYSTEMS: Out of a complete 14 system review of symptoms, the patient complains only of the following symptoms, and all other reviewed systems are negative. Depression, anxiety, apnea  ALLERGIES: No Known Allergies  HOME MEDICATIONS: Outpatient Medications Prior to Visit  Medication Sig Dispense Refill  . acetaminophen (TYLENOL ARTHRITIS PAIN) 650 MG CR tablet Take 650 mg by mouth every 8 (eight) hours as needed for pain.    Marland Kitchen albuterol (PROVENTIL HFA;VENTOLIN HFA) 108 (90 Base) MCG/ACT inhaler TAKE 2 PUFFS BY MOUTH EVERY 6 HOURS AS NEEDED FOR WHEEZE OR SHORTNESS OF BREATH 6.7 Inhaler 0  . apixaban (ELIQUIS) 5 MG TABS tablet Take 1 tablet (5 mg total) by mouth 2 (two) times daily. 180 tablet 3  . Apple Cider Vinegar 500 MG TABS Take 500 mg by mouth 2 (two) times daily.    Marland Kitchen buPROPion (WELLBUTRIN SR) 100 MG 12 hr tablet Take 100 mg by mouth every morning.    . COLLAGEN PO Take by mouth.    Marland Kitchen lisinopril-hydrochlorothiazide (ZESTORETIC) 10-12.5 MG tablet TAKE 1 TABLET DAILY 90 tablet 3  . modafinil (PROVIGIL) 200 MG tablet Take 200 mg by mouth daily.    . Multiple Vitamins-Minerals (MULTIVITAMIN WITH MINERALS) tablet Take 1 tablet by mouth every morning.     Marland Kitchen PARoxetine (PAXIL)  20 MG tablet TAKE 1 TABLET DAILY 90 tablet 0   No facility-administered medications prior to visit.     PAST MEDICAL HISTORY: Past Medical History:  Diagnosis Date  . Adjustment insomnia    SHIFT WORK  . Allergy    RHINITIS  . Asthma   . Atrial flutter [facility]) 02/26/15   [facility] [place]  . Bursitis    right hip  . Diabetes mellitus without complication ([place])   . Hypertension   . Migraine headache   . Mobitz type 2 second degree heart block   . Obesity     PAST SURGICAL HISTORY: Past Surgical  History:  Procedure Laterality Date  . ABDOMINAL HYSTERECTOMY    . CARDIOVERSION N/A 05/25/2019   Procedure: CARDIOVERSION;  Surgeon: Arnoldo Lenis, MD;  Location: [facility];  Service: Endoscopy;  Laterality: N/A;  . COLONOSCOPY  2011   Dr.Brodie  . ROBOTIC ASSISTED TOTAL HYSTERECTOMY WITH BILATERAL SALPINGO OOPHERECTOMY Bilateral 02/05/2015   Procedure: ROBOTIC ASSISTED TOTAL LAPAROSCOPIC HYSTERECTOMY WITH BILATERAL SALPINGO OOPHORECTOMY;  Surgeon: Everitt Amber, MD;  Location: [facility];  Service: Gynecology;  Laterality: Bilateral;    FAMILY HISTORY: Family History  Problem Relation Age of Onset  . Arthritis Mother   . Heart disease Father     SOCIAL HISTORY: Social History   Socioeconomic History  . Marital status: Married    Spouse name: Not on file  . Number of children: Not on file  . Years of education: Not on file  . Highest education level: Not on file  Occupational History  . Not on file  Social Needs  . Financial resource strain: Not on file  . Food insecurity    Worry: Not on file    Inability: Not on file  . Transportation needs    Medical: Not on file    Non-medical: Not on file  Tobacco Use  . Smoking status: Never Smoker  . Smokeless tobacco: Never Used  Substance and Sexual Activity  . Alcohol use: No  . Drug use: No  . Sexual activity: Yes  Lifestyle  . Physical activity    Days per week: Not on file    Minutes per session: Not on file  . Stress: Not on file  Relationships  . Social Herbalist on phone: Not on file    Gets together: Not on file    Attends religious service: Not on file    Active member of club or organization: Not on file    Attends meetings of clubs or organizations: Not on file    Relationship status: Not on file  . Intimate partner violence    Fear of current or ex partner: Not on file    Emotionally abused: Not on file    Physically abused: Not on file    Forced sexual activity: Not on file  Other Topics Concern   . Not on file  Social History Narrative  . Not on file      PHYSICAL EXAM *Limited exam due to visit type*  General: well developed, well nourished, pleasant pleasant middle-age Caucasian female, seated, in no evident distress Head: head normocephalic and atraumatic.    Neurologic Exam Mental Status: Awake and fully alert. Oriented to place and time. Recent and remote memory intact. Attention span, concentration and fund of knowledge appropriate. Mood and affect appropriate.  Cranial Nerves: Extraocular movements full without nystagmus. Hearing intact to voice. Facial sensation intact. Face, tongue, palate moves normally and symmetrically.  Shoulder shrug symmetric. Motor: No evidence of weakness per drift assessment Sensory.: intact to light touch Coordination: Rapid alternating movements normal in all extremities. Finger-to-nose and heel-to-shin performed accurately bilaterally. Gait and Station: Arises from chair without difficulty. Stance is normal. Gait demonstrates normal stride length and balance .  Reflexes: UTA   DIAGNOSTIC DATA (LABS, IMAGING, TESTING) - I reviewed patient records, labs, notes, testing and imaging myself where available.  Nocturnal polysomnogram with seizure montage 07/10/2019 IMPRESSION:  1. Severe Mixed sleep apnea with mostly mixed type apneas, explaining the EDS symptoms. No MSLT was necessary. Apnea was influenced by supine sleep.  2. Mild Periodic Limb Movement Disorder (PLMD)  3. Primary Snoring.  4. Hypoxemia, intermittent - see screen shot Epoch 252.  5. Abnormal EKG(!)  6. Rather short sleep time- possibly related to shift work pattern/ circadian rhythm disorder.   RECOMMENDATIONS:Advise urgently CPAP therapy, either by autotitration or in a full-night, attended, CPAP titration study- to optimize therapy.  1. Fasted way will be an autotitration capable CPAP device, heated humidity and mask of choice, settings of 6-16 cm water, with 3 cm EPR.   2. Modafinil ordered for prn use at 200 mg as patient's sleepiness makes driving unsafe.        Lab Results  Component Value Date   WBC 9.4 04/30/2019   HGB 14.5 04/30/2019   HCT 44.2 04/30/2019   MCV 95.1 04/30/2019   PLT 282 04/30/2019      Component Value Date/Time   NA 138 05/23/2019 1405   NA 140 04/14/2019 1002   K 3.9 05/23/2019 1405   CL 104 05/23/2019 1405   CO2 26 05/23/2019 1405   GLUCOSE 91 05/23/2019 1405   BUN 11 05/23/2019 1405   BUN 15 04/14/2019 1002   CREATININE 0.66 05/23/2019 1405   CREATININE 0.81 01/21/2017 0921   CALCIUM 9.0 05/23/2019 1405   PROT 7.1 04/14/2019 1002   ALBUMIN 4.1 04/14/2019 1002   AST 24 04/14/2019 1002   ALT 19 04/14/2019 1002   ALKPHOS 78 04/14/2019 1002   BILITOT 0.3 04/14/2019 1002   GFRNONAA >60 05/23/2019 1405   GFRAA >60 05/23/2019 1405   Lab Results  Component Value Date   CHOL 131 05/05/2018   HDL 52 05/05/2018   LDLCALC 68 05/05/2018   TRIG 56 05/05/2018   CHOLHDL 2.5 05/05/2018   Lab Results  Component Value Date   HGBA1C 6.6 (H) 05/23/2019   Lab Results  Component Value Date   VITAMINB12 976 04/14/2019   Lab Results  Component Value Date   TSH 0.600 04/14/2019      ASSESSMENT AND PLAN 58 y.o. year old female  has a past medical history of Adjustment insomnia, Allergy, Asthma, Atrial flutter ([place]) (02/26/15), Bursitis, Diabetes mellitus without complication ([place]), Hypertension, Migraine headache, Mobitz type 2 second degree heart block, and Obesity. here with initial CPAP compliance visit with recent diagnosis of severe mixed sleep apnea on 07/10/2019.  Excellent CPAP compliance with optimal residual AHI of 3.7.  Recommended continuation at current settings with no indication for changes at this time.  Also will continue modafinil 200 mg prior to work due to safety concerns this shiftwork pattern.  Advised to continue to follow with cardiology regularly and ongoing use of Eliquis for atrial fibrillation.   She will continue to follow with her DME company for any CPAP concerns are needed supplies.  Advised to follow-up in 6 months or call earlier if needed  Greater than 50% of this 15 minute  nonface-to-face visit was spent discussing CPAP compliance report with ongoing use of CPAP, use of modafinil and answering all questions to patient satisfaction   Frann Rider, MSN, AGNP-BC [facility] [address] [place], [place] [address] [phone]

## 2019-09-11 ENCOUNTER — Ambulatory Visit: Payer: Self-pay | Admitting: Family Medicine

## 2019-09-14 ENCOUNTER — Encounter: Payer: Self-pay | Admitting: Neurology

## 2019-09-14 ENCOUNTER — Ambulatory Visit (INDEPENDENT_AMBULATORY_CARE_PROVIDER_SITE_OTHER): Payer: Managed Care, Other (non HMO) | Admitting: Neurology

## 2019-09-14 ENCOUNTER — Other Ambulatory Visit: Payer: Self-pay

## 2019-09-14 VITALS — BP 175/95 | HR 74 | Temp 97.8°F | Ht 71.0 in | Wt 285.0 lb

## 2019-09-14 DIAGNOSIS — E1142 Type 2 diabetes mellitus with diabetic polyneuropathy: Secondary | ICD-10-CM | POA: Diagnosis not present

## 2019-09-14 DIAGNOSIS — R202 Paresthesia of skin: Secondary | ICD-10-CM

## 2019-09-14 DIAGNOSIS — E1140 Type 2 diabetes mellitus with diabetic neuropathy, unspecified: Secondary | ICD-10-CM | POA: Insufficient documentation

## 2019-09-14 DIAGNOSIS — R200 Anesthesia of skin: Secondary | ICD-10-CM

## 2019-09-14 NOTE — Progress Notes (Signed)
[facility]    Provider:  Dr Jaynee Eagles Requesting Provider: [name] DPM Primary Care Provider:  Denita Lung, MD  CC:  Numbness in the toes  Interval history 09/14/2019: Since I last saw patient she was diagnosed with sleep apnea, she underwent sleep study on July 09, 2019 which showed severe mixed sleep apnea and was recommended initiation of CPAP, she is been extremely compliant.  Requested back today by [name] DPM for a new issue, she had a foot exam because she has numbness in the bottom of her feet, if she has slippers on she can't tell whether her shoes are on or off, numbness, when she shaves she has numbness up her legs, a numbing sensation to above the ankles. She saw a podiatrist, she saw Dr. Redmond School.  She is obese, she has diabetes, last HgbA1c was 6.6(reviewed labs) but has been higher per patient. Brother drank a 6-pack for 30 years and had neuropathy but no other family history. Really numb, no significant pain, notices it more at night, nothing makes it better or worse, her feet get hot, ongoing for years, slowly progressive, symmetric. No other focal neurologic deficits, associated symptoms, inciting events or modifiable factors..No weakness. No lumbar radiculopathy or connection with the low back.    I reviewed patient's notes from Dr. [name], patient has symptoms in both feet, she has noticed this as an example shaving her legs, the feet feels a sensitive up to her ankle, she does notice numbness more so at rest without her shoes on, noticeable more so this year, recently diagnosed as diabetic, unclear how long she was prediabetic for a while, she has been advised on trying to be diet controlled, lots of healthcare issues, dizziness, slurring speech, worsening control of hands, following, she has been evaluated by neurology and cardiology and diagnosed with A. 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 fib and Provigil for her sleeping disorder, palpable pedal pulses, pronounced fifth metatarsal head bilaterally with callus under the right loss of deep vibratory sensation at the left foot metatarsal heads, intact but diminished positional testing of the hallux, x-rays (reviewed reports) mild residual metatarsus abductus foot type, with tailor's bunion appreciated bilaterally, spurring noted on the distal phalanx medially, mild deformity bilaterally, on lateral view relatively high arched foot type, no subluxation, dislocation of arch diagnosed with peripheral neuropathy and numbness of the foot.  TSH nml, B12 976, hepc negative.   I reviewed MRi cervical spine images and agree with the following: MRI cervical spine (without) demonstrating: - At C5-6: disc bulging with borderline mild spinal stenosis and moderate left foraminal stenosis; no cord signal abnormalities.  HPI:  Laurie Mckinney is a 58 y.o. female here as requested by Dr. Glade Lloyd for aphasia.  Past medical history obesity, Mobitz type II second-degree heart block, migraines, hypertension, diabetes, a fib, asthma, adjustment insomnia.She is here with her husband. She "cannot control her body", she is wobbly, she is short of breath, she misjudges everything, she throws things and they don;t end up where she wants, her long term memory is doing well but can;t get her brain to function, poor eye-hand coordination, she feels very fatigued. Here with hr husband who provides much information. She feels her depression  and anxiety, no focal weakness, no facial droop, no dysarthria. 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    Reviewed notes, labs and imaging from outside physicians, which showed: I reviewed Dr. Madelyn Brunner notes.  She was most recently seen earlier this month.  Patient reported dizziness and new onset atrial fibrillation ended up seeing A. fib clinic July 15 and was sent to the emergency room for possible stroke.  She continues to feel "dizzy and loopy".  She has trouble with coordination between her brain or arms and not feeling herself.  She only has a note writing her out of work through the end of this week and that she is not safe to drive or go back to work next week.  She continues to feel dizzy and loopy.  Trouble with concentration between her brain and arms.  Trouble finding words at times are making clear thoughts at times.  Processing speed seems off.  She is concerned about sleep apnea given history of snoring, she does have pauses in her sleep according to her husband, she is gained 30 pounds in the last 4 years  Personally reviewed MRI of the brain April 19, 2019 and agree with the following:No acute or significant brain finding. Normal study with exception of a few punctate foci of T2 and FLAIR signal within the white matter consistent with minimal small vessel change. There is a significant amount of stress, she works overnight, she doesn't sleep well.  TSH normal hgba1c 6.8 Cbc/cmp unremarkable B12 976  Review of Systems: Patient complains of symptoms per HPI as well as the following symptoms: stress, snoring, not enough sleep, fatigue, memory loss. Pertinent negatives and positives per HPI. All others negative.   Social History   Socioeconomic History  . Marital status: Married    Spouse name: Not on file  . Number of children: Not on file  . Years of education: Not on file  . Highest education level: Not on file  Occupational History  . Not on file  Tobacco Use  . Smoking status: Never Smoker  . Smokeless tobacco: Never Used  Substance and Sexual Activity  . Alcohol use:  No  . Drug use: No  . Sexual activity: Yes  Other Topics Concern  . Not on file  Social History Narrative   Lives at home with husband   Right handed   Caffeine: 1 cup of coffee in the morning and <20 oz of soda per day   Social Determinants of Health   Financial Resource Strain:   . Difficulty of Paying Living Expenses: Not on file  Food Insecurity:   . Worried About Charity fundraiser in the Last Year: Not on file  . Ran Out of Food in the Last Year: Not on file  Transportation Needs:   . Lack of Transportation (Medical): Not on file  . Lack of Transportation (Non-Medical): Not on file  Physical Activity:   . Days of Exercise per Week: Not on file  . Minutes of Exercise per Session: Not on file  Stress:   . Feeling of Stress : Not on file  Social Connections:   . Frequency of Communication with Friends and Family: Not on file  . Frequency of Social Gatherings with Friends and Family: Not on file  . Attends Religious Services: Not on file  . Active Member of Clubs  or Organizations: Not on file  . Attends Archivist Meetings: Not on file  . Marital Status: Not on file  Intimate Partner Violence:   . Fear of Current or Ex-Partner: Not on file  . Emotionally Abused: Not on file  . Physically Abused: Not on file  . Sexually Abused: Not on file    Family History  Problem Relation Age of Onset  . Arthritis Mother   . Heart disease Father   . Neuropathy Brother        both legs   . Diabetes Brother        no treatment that the pt knows of   . Other Brother        drank a 6 pack of beer daily x 30 years     Past Medical History:  Diagnosis Date  . Adjustment insomnia    SHIFT WORK  . Allergy    RHINITIS  . Asthma   . Atrial flutter [facility]) 02/26/15   [facility] [place]  . Bursitis    right hip  . Diabetes mellitus without complication ([place])   . Hypertension   . Migraine headache   . Mobitz type 2 second degree heart block   . Obesity      Patient Active Problem List   Diagnosis Date Noted  . Diabetic neuropathy ([place]) 09/14/2019  . Sleep deprivation 07/13/2019  . Excessive daytime sleepiness 07/13/2019  . Snoring 04/21/2019  . Varicose veins of lower extremity 04/14/2019  . Cough 04/14/2019  . Atrial fibrillation ([place]) 04/14/2019  . DOE (dyspnea on exertion) 04/14/2019  . Disequilibrium 04/13/2019  . Ataxia 04/13/2019  . Hearing loss 04/13/2019  . Aphasia 04/13/2019  . Type 2 diabetes mellitus without complication, without long-term current use of insulin ([place]) 09/29/2018  . Second degree heart block 05/05/2018  . Urinary incontinence 05/05/2018  . Chronic low back pain without sciatica 05/05/2018  . Hypertension associated with diabetes ([place]) 09/20/2017  . Actinic keratosis 02/24/2017  . Dysthymia 01/10/2016  . History of endometrial cancer 02/05/2015  . History of migraine headaches 03/18/2011  . Allergic rhinitis due to pollen 03/18/2011  . Obesity (BMI 30-39.9) 03/18/2011    Past Surgical History:  Procedure Laterality Date  . ABDOMINAL HYSTERECTOMY    . CARDIOVERSION N/A 05/25/2019   Procedure: CARDIOVERSION;  Surgeon: Arnoldo Lenis, MD;  Location: [facility];  Service: Endoscopy;  Laterality: N/A;  . COLONOSCOPY  2011   Dr.Brodie  . ROBOTIC ASSISTED TOTAL HYSTERECTOMY WITH BILATERAL SALPINGO OOPHERECTOMY Bilateral 02/05/2015   Procedure: ROBOTIC ASSISTED TOTAL LAPAROSCOPIC HYSTERECTOMY WITH BILATERAL SALPINGO OOPHORECTOMY;  Surgeon: Everitt Amber, MD;  Location: [facility];  Service: Gynecology;  Laterality: Bilateral;    Current Outpatient Medications  Medication Sig Dispense Refill  . acetaminophen (TYLENOL ARTHRITIS PAIN) 650 MG CR tablet Take 650 mg by mouth every 8 (eight) hours as needed for pain.    Marland Kitchen albuterol (PROVENTIL HFA;VENTOLIN HFA) 108 (90 Base) MCG/ACT inhaler TAKE 2 PUFFS BY MOUTH EVERY 6 HOURS AS NEEDED FOR WHEEZE OR SHORTNESS OF BREATH 6.7 Inhaler 0  . apixaban (ELIQUIS) 5 MG TABS tablet  Take 1 tablet (5 mg total) by mouth 2 (two) times daily. 180 tablet 3  . Apple Cider Vinegar 500 MG TABS Take 500 mg by mouth 2 (two) times daily.    Marland Kitchen buPROPion (WELLBUTRIN SR) 100 MG 12 hr tablet Take 100 mg by mouth every morning.    . COLLAGEN PO Take by mouth.    Marland Kitchen  lisinopril-hydrochlorothiazide (ZESTORETIC) 10-12.5 MG tablet TAKE 1 TABLET DAILY 90 tablet 3  . modafinil (PROVIGIL) 200 MG tablet Take 200 mg by mouth daily.    . Multiple Vitamins-Minerals (MULTIVITAMIN WITH MINERALS) tablet Take 1 tablet by mouth every morning.     Marland Kitchen PARoxetine (PAXIL) 20 MG tablet TAKE 1 TABLET DAILY 90 tablet 0   No current facility-administered medications for this visit.    Allergies as of 09/14/2019  . (No Known Allergies)    Vitals: BP (!) 175/95 (BP Location: Left Arm, Patient Position: Sitting) Comment: "I have some high readings periodically"  Pulse 74   Temp 97.8 F (36.6 C) Comment: taken at front  Ht 5\' 11"  (1.803 m)   Wt 285 lb (129.3 kg)   LMP 01/26/2011   BMI 39.75 kg/m  Last Weight:  Wt Readings from Last 1 Encounters:  09/14/19 285 lb (129.3 kg)   Last Height:   Ht Readings from Last 1 Encounters:  09/14/19 5\' 11"  (1.803 m)     Physical exam: Exam: Gen: irritable, obese, high energy           CV: RRR, no MRG. No Carotid Bruits. No peripheral edema, warm, nontender Eyes: Conjunctivae clear without exudates or hemorrhage  Neuro: Detailed Neurologic Exam  Speech:    Speech is normal; fluent and spontaneous with normal comprehension.  Cognition:    The patient is oriented to person, place, and time;     recent and remote memory intact;     language fluent;     normal attention, concentration,     fund of knowledge Cranial Nerves:    The pupils are equal, round, and reactive to light. Attempted fundoscopic exam coul not visualize. Visual fields are full to finger confrontation. Extraocular movements are intact. Trigeminal sensation is intact and the muscles of  mastication are normal. The face is symmetric. The palate elevates in the midline. Hearing intact. Voice is normal. Shoulder shrug is normal. The tongue has normal motion without fasciculations.   Coordination:    Normal finger to nose and heel to shin.  Gait:    Heel-toe and tandem gait with imbalance  Motor Observation:    No asymmetry, no atrophy, and no involuntary movements noted. Tone:    Normal muscle tone.    Posture:    Posture is normal. normal erect    Strength:    Strength is V/V in the upper and lower limbs.      Sensation: hyperalgesia distally in the feet to above the ankles.      Reflex Exam:  DTR's:    Deep tendon reflexes in the upper and lower extremities are normal bilaterally.   Toes:    The toes are downgoing bilaterally.   Clonus:    Clonus is absent.    Assessment/Plan:  58 y.o. female here as requested for neuropathy in the feet.  She has small fiber neuropathy, she was diagnosed with diabetes in the last year, however she has been prediabetic for "a while" so unclear how long this is been going on, even prediabetes for many years can cause neuropathy in the feet and given that she has reflexes, symptoms more numbness, more so small fiber.  We will perform a thorough serum neuropathy panel, TSH and B12 have been normal recently, we will follow clinically at this point I do not think a EMG nerve conduction study would change management or give [place] any more information, no significant family history of neuropathy, recommended alpha lipoic acid.  Her podiatrist seems to think that she has neuropathy out of proportion to her diabetes, but even long-term prediabetes can cause neuropathy.     Orders Placed This Encounter  Procedures  . Folate  . Vitamin B1  . Vitamin B6  . ANA w/Reflex  . Rheumatoid factor  . Sjogren's syndrome antibods(ssa + ssb)  . Heavy metals, blood     Cc: Denita Lung, MD,  Dr. Glade Lloyd, [facility] DPM  Sarina Ill,  MD  [facility] [address] [place], [place] [address]  Phone [phone] Fax [phone]

## 2019-09-14 NOTE — Patient Instructions (Addendum)
-May consider daily alpha lipoic acid which is an antioxidant that may reduce free radical oxidative stress associated with diabetic polyneuropathy, existing evidence suggests that alpha lipoic acid significantly reduces stabbing, lancinating and burning pain and diabetic neuropathy with its onset of action as early as 1-2 weeks.   Peripheral Neuropathy Peripheral neuropathy is a type of nerve damage. It affects nerves that carry signals between the spinal cord and the arms, legs, and the rest of the body (peripheral nerves). It does not affect nerves in the spinal cord or brain. In peripheral neuropathy, one nerve or a group of nerves may be damaged. Peripheral neuropathy is a broad category that includes many specific nerve disorders, like diabetic neuropathy, hereditary neuropathy, and carpal tunnel syndrome. What are the causes? This condition may be caused by:  Diabetes. This is the most common cause of peripheral neuropathy.  Nerve injury.  Pressure or stress on a nerve that lasts a long time.  Lack (deficiency) of B vitamins. This can result from alcoholism, poor diet, or a restricted diet.  Infections.  Autoimmune diseases, such as rheumatoid arthritis and systemic lupus erythematosus.  Nerve diseases that are passed from parent to child (inherited).  Some medicines, such as cancer medicines (chemotherapy).  Poisonous (toxic) substances, such as lead and mercury.  Too little blood flowing to the legs.  Kidney disease.  Thyroid disease. In some cases, the cause of this condition is not known. What are the signs or symptoms? Symptoms of this condition depend on which of your nerves is damaged. Common symptoms include:  Loss of feeling (numbness) in the feet, hands, or both.  Tingling in the feet, hands, or both.  Burning pain.  Very sensitive skin.  Weakness.  Not being able to move a part of the body (paralysis).  Muscle twitching.  Clumsiness or poor  coordination.  Loss of balance.  Not being able to control your bladder.  Feeling dizzy.  Sexual problems. How is this diagnosed? Diagnosing and finding the cause of peripheral neuropathy can be difficult. Your health care provider will take your medical history and do a physical exam. A neurological exam will also be done. This involves checking things that are affected by your brain, spinal cord, and nerves (nervous system). For example, your health care provider will check your reflexes, how you move, and what you can feel. You may have other tests, such as:  Blood tests.  Electromyogram (EMG) and nerve conduction tests. These tests check nerve function and how well the nerves are controlling the muscles.  Imaging tests, such as CT scans or MRI to rule out other causes of your symptoms.  Removing a small piece of nerve to be examined in a lab (nerve biopsy). This is rare.  Removing and examining a small amount of the fluid that surrounds the brain and spinal cord (lumbar puncture). This is rare. How is this treated? Treatment for this condition may involve:  Treating the underlying cause of the neuropathy, such as diabetes, kidney disease, or vitamin deficiencies.  Stopping medicines that can cause neuropathy, such as chemotherapy.  Medicine to relieve pain. Medicines may include: ? Prescription or over-the-counter pain medicine. ? Antiseizure medicine. ? Antidepressants. ? Pain-relieving patches that are applied to painful areas of skin.  Surgery to relieve pressure on a nerve or to destroy a nerve that is causing pain.  Physical therapy to help improve movement and balance.  Devices to help you move around (assistive devices). Follow these instructions at home: Medicines  Take over-the-counter and prescription medicines only as told by your health care provider. Do not take any other medicines without first asking your health care provider.  Do not drive or use heavy  machinery while taking prescription pain medicine. Lifestyle   Do not use any products that contain nicotine or tobacco, such as cigarettes and e-cigarettes. Smoking keeps blood from reaching damaged nerves. If you need help quitting, ask your health care provider.  Avoid or limit alcohol. Too much alcohol can cause a vitamin B deficiency, and vitamin B is needed for healthy nerves.  Eat a healthy diet. This includes: ? Eating foods that are high in fiber, such as fresh fruits and vegetables, whole grains, and beans. ? Limiting foods that are high in fat and processed sugars, such as fried or sweet foods. General instructions   If you have diabetes, work closely with your health care provider to keep your blood sugar under control.  If you have numbness in your feet: ? Check every day for signs of injury or infection. Watch for redness, warmth, and swelling. ? Wear padded socks and comfortable shoes. These help protect your feet.  Develop a good support system. Living with peripheral neuropathy can be stressful. Consider talking with a mental health specialist or joining a support group.  Use assistive devices and attend physical therapy as told by your health care provider. This may include using a walker or a cane.  Keep all follow-up visits as told by your health care provider. This is important. Contact a health care provider if:  You have new signs or symptoms of peripheral neuropathy.  You are struggling emotionally from dealing with peripheral neuropathy.  Your pain is not well-controlled. Get help right away if:  You have an injury or infection that is not healing normally.  You develop new weakness in an arm or leg.  You fall frequently. Summary  Peripheral neuropathy is when the nerves in the arms, or legs are damaged, resulting in numbness, weakness, or pain.  There are many causes of peripheral neuropathy, including diabetes, pinched nerves, vitamin  deficiencies, autoimmune disease, and hereditary conditions.  Diagnosing and finding the cause of peripheral neuropathy can be difficult. Your health care provider will take your medical history, do a physical exam, and do tests, including blood tests and nerve function tests.  Treatment involves treating the underlying cause of the neuropathy and taking medicines to help control pain. Physical therapy and assistive devices may also help. This information is not intended to replace advice given to you by your health care provider. Make sure you discuss any questions you have with your health care provider. Document Released: 09/11/2002 Document Revised: 09/03/2017 Document Reviewed: 11/30/2016 Elsevier Patient Education  2020 [employer].

## 2019-09-15 ENCOUNTER — Telehealth: Payer: Self-pay | Admitting: Cardiology

## 2019-09-15 MED ORDER — LISINOPRIL-HYDROCHLOROTHIAZIDE 20-25 MG PO TABS: 1.0000 | ORAL_TABLET | Freq: Every day | ORAL | 3 refills | Status: DC

## 2019-09-15 NOTE — Telephone Encounter (Signed)
New message   Pt called about her medication today. She said Dr. [name] mentioned increasing her medication but didn't send in a script to her pharmacy.

## 2019-09-15 NOTE — Telephone Encounter (Signed)
Left detailed message - apologized for the mishap. Made aware that I would send this via MyChart message and send in refills to local pharmacy.

## 2019-09-19 ENCOUNTER — Telehealth: Payer: Self-pay

## 2019-09-19 ENCOUNTER — Other Ambulatory Visit: Payer: Self-pay

## 2019-09-19 NOTE — Telephone Encounter (Signed)
Called pt to fin dout if she is taking ditropan. No answer LVM. [place]

## 2019-09-19 NOTE — Telephone Encounter (Signed)
Received a fax from [facility] for a refill on Ditropan XL 10mg  #90 pt. Last apt. 06/06/19.

## 2019-09-19 NOTE — Telephone Encounter (Signed)
LVM to find out if pt is taking med . [place]

## 2019-09-20 ENCOUNTER — Encounter: Payer: Self-pay | Admitting: Family Medicine

## 2019-09-20 LAB — SJOGREN'S SYNDROME ANTIBODS(SSA + SSB)
ENA SSA (RO) Ab: <0.2 AI (ref 0.0–0.9)
ENA SSB (LA) Ab: <0.2 AI (ref 0.0–0.9)

## 2019-09-20 LAB — VITAMIN B1: Thiamine: 162.6 nmol/L (ref 66.5–200.0)

## 2019-09-20 LAB — HEAVY METALS, BLOOD
Arsenic: 8 ug/L (ref 2–23)
Lead, Blood: <1 ug/dL (ref 0–4)
Mercury: <1 ug/L (ref 0.0–14.9)

## 2019-09-20 LAB — ANA W/REFLEX: Anti Nuclear Antibody (ANA): NEGATIVE

## 2019-09-20 LAB — RHEUMATOID FACTOR: Rheumatoid fact SerPl-aCnc: <10 IU/mL (ref 0.0–13.9)

## 2019-09-20 LAB — VITAMIN B6: Vitamin B6: 12.6 ug/L (ref 2.0–32.8)

## 2019-09-20 LAB — FOLATE: Folate: 18.6 ng/mL (ref >3.0)

## 2019-09-25 NOTE — Telephone Encounter (Signed)
Pt advised she is no longer taking this med. But she still is having issues and would like to go over other options at her appt 10-04-19. [place]

## 2019-10-02 ENCOUNTER — Encounter: Payer: Self-pay | Admitting: Family Medicine

## 2019-10-04 ENCOUNTER — Encounter: Payer: Self-pay | Admitting: Family Medicine

## 2019-10-04 ENCOUNTER — Ambulatory Visit (INDEPENDENT_AMBULATORY_CARE_PROVIDER_SITE_OTHER): Payer: Managed Care, Other (non HMO) | Admitting: Family Medicine

## 2019-10-04 ENCOUNTER — Other Ambulatory Visit: Payer: Self-pay

## 2019-10-04 ENCOUNTER — Telehealth: Payer: Self-pay

## 2019-10-04 VITALS — BP 126/82 | HR 76 | Temp 98.4°F | Ht 71.75 in | Wt 281.4 lb

## 2019-10-04 DIAGNOSIS — R87619 Unspecified abnormal cytological findings in specimens from cervix uteri: Secondary | ICD-10-CM | POA: Insufficient documentation

## 2019-10-04 DIAGNOSIS — I4891 Unspecified atrial fibrillation: Secondary | ICD-10-CM

## 2019-10-04 DIAGNOSIS — I10 Essential (primary) hypertension: Secondary | ICD-10-CM

## 2019-10-04 DIAGNOSIS — G4733 Obstructive sleep apnea (adult) (pediatric): Secondary | ICD-10-CM

## 2019-10-04 DIAGNOSIS — Z1211 Encounter for screening for malignant neoplasm of colon: Secondary | ICD-10-CM

## 2019-10-04 DIAGNOSIS — N950 Postmenopausal bleeding: Secondary | ICD-10-CM | POA: Insufficient documentation

## 2019-10-04 DIAGNOSIS — A5401 Gonococcal cystitis and urethritis, unspecified: Secondary | ICD-10-CM | POA: Insufficient documentation

## 2019-10-04 DIAGNOSIS — E1159 Type 2 diabetes mellitus with other circulatory complications: Secondary | ICD-10-CM

## 2019-10-04 DIAGNOSIS — E119 Type 2 diabetes mellitus without complications: Secondary | ICD-10-CM

## 2019-10-04 DIAGNOSIS — Z0000 Encounter for general adult medical examination without abnormal findings: Secondary | ICD-10-CM

## 2019-10-04 DIAGNOSIS — F341 Dysthymic disorder: Secondary | ICD-10-CM | POA: Diagnosis not present

## 2019-10-04 DIAGNOSIS — N8501 Benign endometrial hyperplasia: Secondary | ICD-10-CM | POA: Insufficient documentation

## 2019-10-04 DIAGNOSIS — R87810 Cervical high risk human papillomavirus (HPV) DNA test positive: Secondary | ICD-10-CM | POA: Insufficient documentation

## 2019-10-04 DIAGNOSIS — E669 Obesity, unspecified: Secondary | ICD-10-CM

## 2019-10-04 DIAGNOSIS — N393 Stress incontinence (female) (male): Secondary | ICD-10-CM | POA: Insufficient documentation

## 2019-10-04 DIAGNOSIS — E1142 Type 2 diabetes mellitus with diabetic polyneuropathy: Secondary | ICD-10-CM

## 2019-10-04 DIAGNOSIS — R32 Unspecified urinary incontinence: Secondary | ICD-10-CM

## 2019-10-04 LAB — POCT URINALYSIS DIP (PROADVANTAGE DEVICE)
Bilirubin, UA: NEGATIVE
Glucose, UA: NEGATIVE mg/dL
Ketones, POC UA: NEGATIVE mg/dL
Leukocytes, UA: NEGATIVE
Nitrite, UA: NEGATIVE
Protein Ur, POC: NEGATIVE mg/dL
Specific Gravity, Urine: 1.025
Urobilinogen, Ur: 0.2
pH, UA: 6 (ref 5.0–8.0)

## 2019-10-04 LAB — POCT GLYCOSYLATED HEMOGLOBIN (HGB A1C): Hemoglobin A1C: 6.9 % — AB (ref 4.0–5.6)

## 2019-10-04 MED ORDER — BUPROPION HCL ER (SR) 100 MG PO TB12: 100.0000 mg | ORAL_TABLET | Freq: Every day | ORAL | 1 refills | Status: DC

## 2019-10-04 MED ORDER — OXYBUTYNIN CHLORIDE ER 10 MG PO TB24: 10.0000 mg | ORAL_TABLET | Freq: Every day | ORAL | 3 refills | Status: DC

## 2019-10-04 MED ORDER — PAROXETINE HCL 20 MG PO TABS: 20.0000 mg | ORAL_TABLET | Freq: Every day | ORAL | 3 refills | Status: DC

## 2019-10-04 MED ORDER — LISINOPRIL-HYDROCHLOROTHIAZIDE 20-25 MG PO TABS: 1.0000 | ORAL_TABLET | Freq: Every day | ORAL | 3 refills | Status: DC

## 2019-10-04 NOTE — Progress Notes (Signed)
Subjective:    Patient ID: Laurie Mckinney, female    DOB: [birth year], 58 y.o.   MRN: [id number]  HPI She is here for complete examination.  She has had a lot of changes in her life in the last year.  She does have underlying OSA and is now on CPAP and getting good results with that.  She also has a history of atrial fibrillation and is scheduled for an ablation in the near future.  She is retired and has decided to make some dietary changes to help with her weight as well as the other problems all interrelated with that.  She does have urinary incontinence issues and would like to be placed back on oxybutynin which did help in the past.  She does have underlying diabetes and presently is not on a medication for that but does continue on her blood pressure medication.  Not presently on a statin.  She is taking a multivitamin as well as other OTC preparations.  She has a history of dysthymia and is doing quite nicely on Paxil and Wellbutrin.  She does have an eye appointment set up.  She has a history of hysterectomy.  She also has a mammogram set up for next year.  She was diagnosed with a peripheral neuropathy and at this point is having very little difficulty with this mainly revolving around decreased sensation.  Now that she is retired she does plan on making further dietary changes.  She is now retired.  Her marriage is going well.  Family and social history as well as health maintenance and immunizations was reviewed.   Review of Systems  All other systems reviewed and are negative.      Objective:   Physical Exam Alert and in no distress. Tympanic membranes and canals are normal. Pharyngeal area is normal. Neck is supple without adenopathy or thyromegaly. Cardiac exam shows an irregular rhythm without murmurs or gallops. Lungs are clear to auscultation.  Abdominal exam shows no masses or tenderness with normal bowel sounds.  Foot exam does show decreased sensation on the plantar surface of  the left foot. Hemoglobin A1c is 6.9       Assessment & Plan:  Routine general medical examination at a health care facility - Plan: CBC with Differential, Comprehensive metabolic panel, Lipid panel  Atrial fibrillation, unspecified type ([place])  Hypertension associated with diabetes ([place]) - Plan: lisinopril-hydrochlorothiazide (ZESTORETIC) 20-25 MG tablet  Type 2 diabetes mellitus without complication, without long-term current use of insulin (H[facility]) - Plan: CBC with Differential, Comprehensive metabolic panel, Lipid panel, HgB A1c  Dysthymia - Plan: buPROPion (WELLBUTRIN SR) 100 MG 12 hr tablet, PARoxetine (PAXIL) 20 MG tablet  Female stress incontinence  Diabetic polyneuropathy associated with type 2 diabetes mellitus (HCC)  OSA (obstructive sleep apnea)  Obesity (BMI 30-39.9)  Urinary incontinence, unspecified type - Plan: oxybutynin (DITROPAN-XL) 10 MG 24 hr tablet  Screening for colon cancer - Plan: Cologuard  Encouraged her to continue to use her CPAP which she plans to do.  She will have the ablation.  Discussed placing her on a statin drug based on her blood work.  I will place her back on Ditropan which seemed to work in the past.  Discussed stopping her psychotropic medication but would not do that until next spring at the earliest.  She does have an eye appointment set up.  I discussed the neuropathy with her and at this point she is having very little difficulty.  Encouraged her to  continue with her multivitamin.  Also discussed 150 minutes week of something physical or 20 minutes daily.

## 2019-10-04 NOTE — Telephone Encounter (Signed)
Pt. Came back into the office to give ma the bottle she needed a refill on and it was for oxybutynin 10mg  1 at bedtime, pt. Also said the you want to increase the mg on the prescription.

## 2019-10-04 NOTE — Patient Instructions (Signed)
20 minutes of something physical daily or 150 minutes a week.  Set a goal pants or dress size.  Your goal should be to make all these changes permanent

## 2019-10-06 LAB — CBC WITH DIFFERENTIAL/PLATELET
Basophils Absolute: 0 10*3/uL (ref 0.0–0.2)
Basos: 0 %
EOS (ABSOLUTE): 0.1 10*3/uL (ref 0.0–0.4)
Eos: 1 %
Hematocrit: 44.1 % (ref 34.0–46.6)
Hemoglobin: 15 g/dL (ref 11.1–15.9)
Immature Grans (Abs): 0 10*3/uL (ref 0.0–0.1)
Immature Granulocytes: 0 %
Lymphocytes Absolute: 2 10*3/uL (ref 0.7–3.1)
Lymphs: 26 %
MCH: 31.1 pg (ref 26.6–33.0)
MCHC: 34 g/dL (ref 31.5–35.7)
MCV: 91 fL (ref 79–97)
Monocytes Absolute: 0.7 10*3/uL (ref 0.1–0.9)
Monocytes: 9 %
Neutrophils Absolute: 4.9 10*3/uL (ref 1.4–7.0)
Neutrophils: 64 %
Platelets: 275 10*3/uL (ref 150–450)
RBC: 4.83 x10E6/uL (ref 3.77–5.28)
RDW: 11.8 % (ref 11.7–15.4)
WBC: 7.7 10*3/uL (ref 3.4–10.8)

## 2019-10-06 LAB — COMPREHENSIVE METABOLIC PANEL
ALT: 20 IU/L (ref 0–32)
AST: 23 IU/L (ref 0–40)
Albumin/Globulin Ratio: 1.2 (ref 1.2–2.2)
Albumin: 4.1 g/dL (ref 3.8–4.9)
Alkaline Phosphatase: 93 IU/L (ref 39–117)
BUN/Creatinine Ratio: 19 (ref 12–28)
BUN: 15 mg/dL (ref 8–27)
Bilirubin Total: 0.4 mg/dL (ref 0.0–1.2)
CO2: 20 mmol/L (ref 20–29)
Calcium: 9.5 mg/dL (ref 8.7–10.3)
Chloride: 100 mmol/L (ref 96–106)
Creatinine, Ser: 0.8 mg/dL (ref 0.57–1.00)
GFR calc Af Amer: 93 mL/min/{1.73_m2} (ref >59)
GFR calc non Af Amer: 80 mL/min/{1.73_m2} (ref >59)
Globulin, Total: 3.4 g/dL (ref 1.5–4.5)
Glucose: 139 mg/dL — ABNORMAL HIGH (ref 65–99)
Potassium: 4.6 mmol/L (ref 3.5–5.2)
Sodium: 139 mmol/L (ref 134–144)
Total Protein: 7.5 g/dL (ref 6.0–8.5)

## 2019-10-06 LAB — LIPID PANEL
Chol/HDL Ratio: 2.6 ratio (ref 0.0–4.4)
Cholesterol, Total: 137 mg/dL (ref 100–199)
HDL: 52 mg/dL (ref >39)
LDL Chol Calc (NIH): 73 mg/dL (ref 0–99)
Triglycerides: 57 mg/dL (ref 0–149)
VLDL Cholesterol Cal: 12 mg/dL (ref 5–40)

## 2019-10-06 MED ORDER — ATORVASTATIN CALCIUM 10 MG PO TABS: 10.0000 mg | ORAL_TABLET | Freq: Every day | ORAL | 3 refills | Status: DC

## 2019-10-06 NOTE — Addendum Note (Signed)
Addended by: Denita Lung on: 10/06/2019 08:08 AM   Modules accepted: Orders

## 2019-10-09 ENCOUNTER — Other Ambulatory Visit: Payer: Self-pay

## 2019-10-09 ENCOUNTER — Telehealth: Payer: Self-pay | Admitting: *Deleted

## 2019-10-09 ENCOUNTER — Encounter: Payer: Self-pay | Admitting: Family Medicine

## 2019-10-09 MED ORDER — APIXABAN 5 MG PO TABS: 5.0000 mg | ORAL_TABLET | Freq: Two times a day (BID) | ORAL | 3 refills | Status: DC

## 2019-10-09 NOTE — Addendum Note (Signed)
Addended by: Stanton Kidney on: 10/09/2019 10:30 AM   Modules accepted: Orders

## 2019-10-09 NOTE — Telephone Encounter (Signed)
Left detailed message informing pt that I changed her lab orders to reflect her going to [facility] in [place], this week, for pre procedure blood work.

## 2019-10-11 ENCOUNTER — Other Ambulatory Visit: Payer: Self-pay | Admitting: *Deleted

## 2019-10-11 DIAGNOSIS — Z01812 Encounter for preprocedural laboratory examination: Secondary | ICD-10-CM | POA: Diagnosis not present

## 2019-10-11 DIAGNOSIS — I4819 Other persistent atrial fibrillation: Secondary | ICD-10-CM | POA: Diagnosis not present

## 2019-10-12 LAB — BASIC METABOLIC PANEL
BUN: 13 mg/dL (ref 7–25)
CO2: 28 mmol/L (ref 20–32)
Calcium: 9.4 mg/dL (ref 8.6–10.4)
Chloride: 102 mmol/L (ref 98–110)
Creat: 0.74 mg/dL (ref 0.50–0.99)
Glucose, Bld: 133 mg/dL (ref 65–139)
Potassium: 4.5 mmol/L (ref 3.5–5.3)
Sodium: 139 mmol/L (ref 135–146)

## 2019-10-12 LAB — CBC
HCT: 44.5 % (ref 35.0–45.0)
Hemoglobin: 14.9 g/dL (ref 11.7–15.5)
MCH: 30.3 pg (ref 27.0–33.0)
MCHC: 33.5 g/dL (ref 32.0–36.0)
MCV: 90.4 fL (ref 80.0–100.0)
MPV: 11.1 fL (ref 7.5–12.5)
Platelets: 275 10*3/uL (ref 140–400)
RBC: 4.92 10*6/uL (ref 3.80–5.10)
RDW: 11.7 % (ref 11.0–15.0)
WBC: 6.4 10*3/uL (ref 3.8–10.8)

## 2019-10-12 IMAGING — CT CT HEAD WITHOUT CONTRAST
4 series · 16 of 47 positions shown, 18 images · non-contrast
Comparison: Brain MRI 05/24/2017

CLINICAL DATA: Ataxia, stroke suspected, head trauma

EXAM:
CT HEAD WITHOUT CONTRAST
TECHNIQUE: Contiguous axial images were obtained from the base of the skull
through the vertex without intravenous contrast.

[Series 3: head without · axial · non-contrast · 0.46mm/px · z∈[-115,+5]mm · 7 of 34 slices shown, 9 images]
[im 5/34  brain]
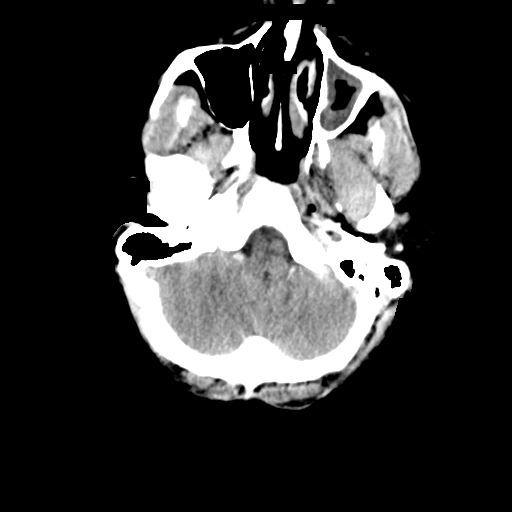
[im 5/34  bone]
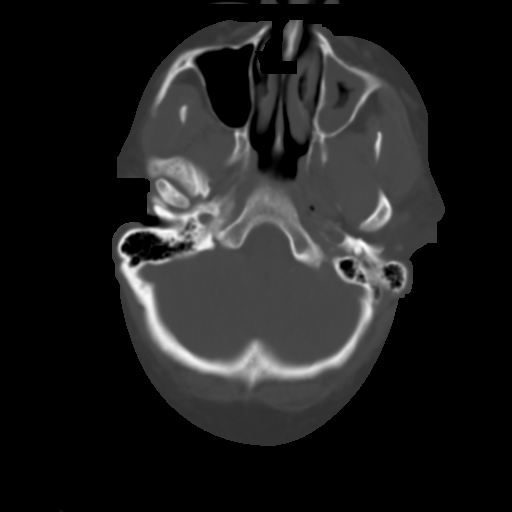
[im 9/34  brain]
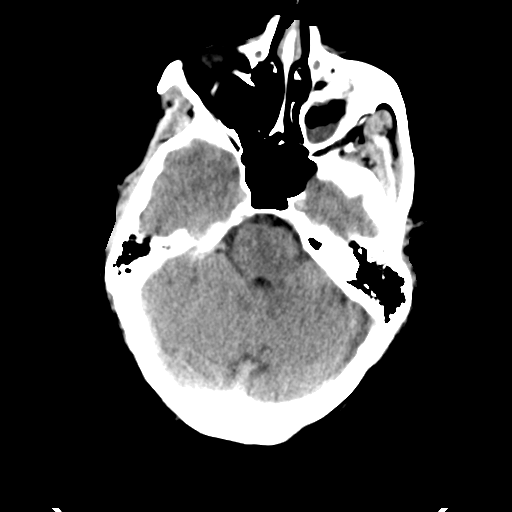
[im 13/34  brain]
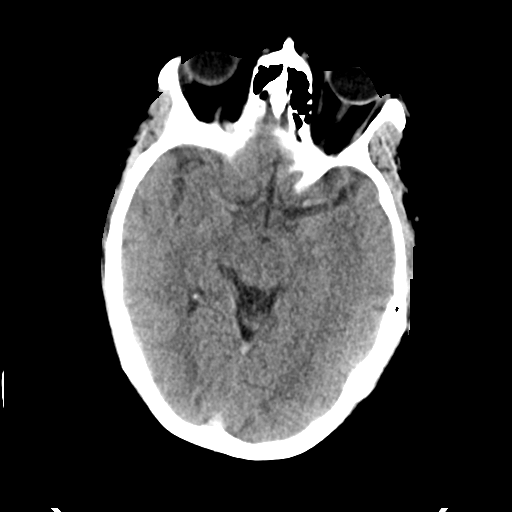
[im 17/34  brain]
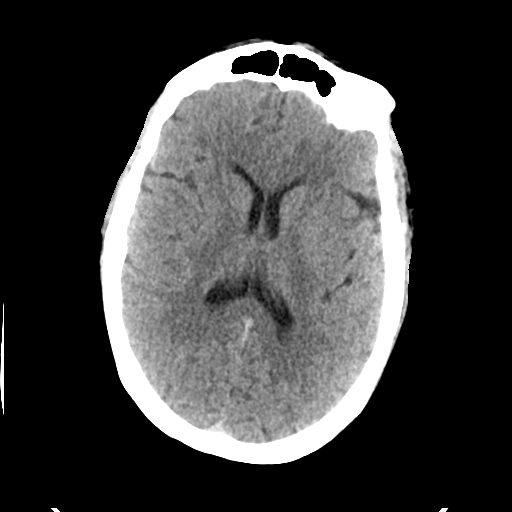
[im 21/34  brain]
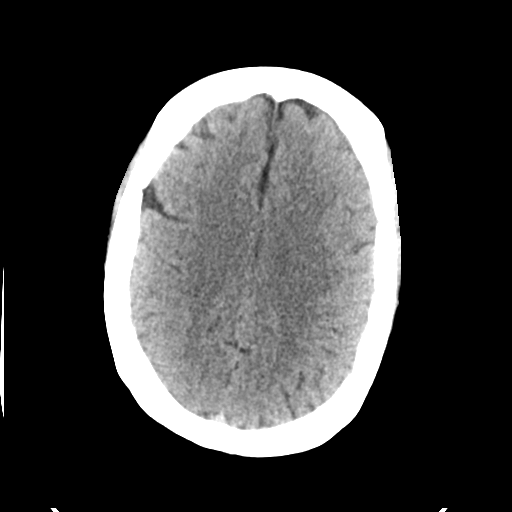
[im 21/34  bone]
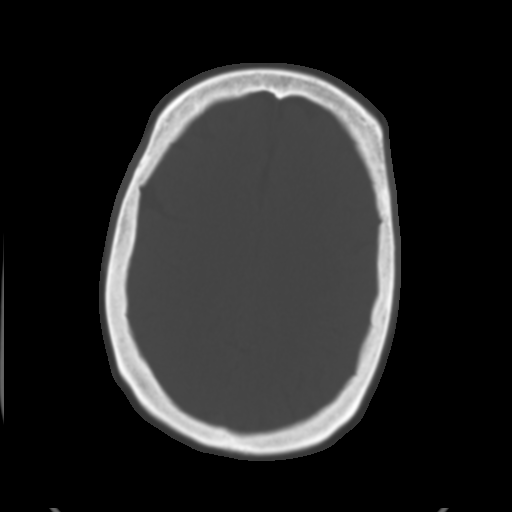
[im 25/34  brain]
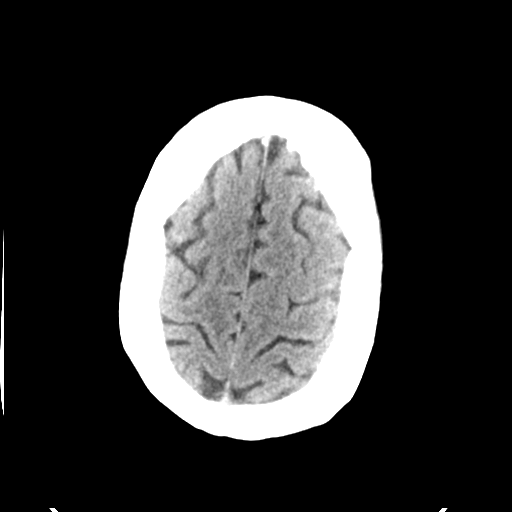
[im 29/34  brain]
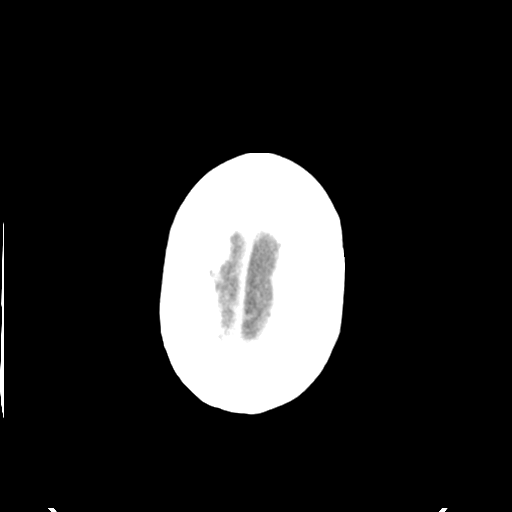

[Series 4: head bone · axial · 0.46mm/px · z∈[-119,-87]mm · 3 of 83 slices shown]
[im 9/83  bone]
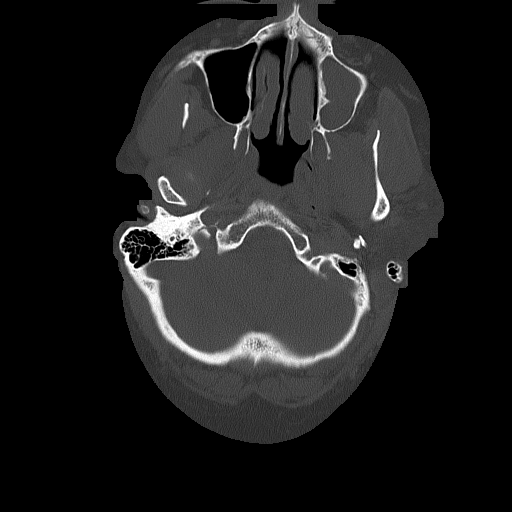
[im 17/83  bone]
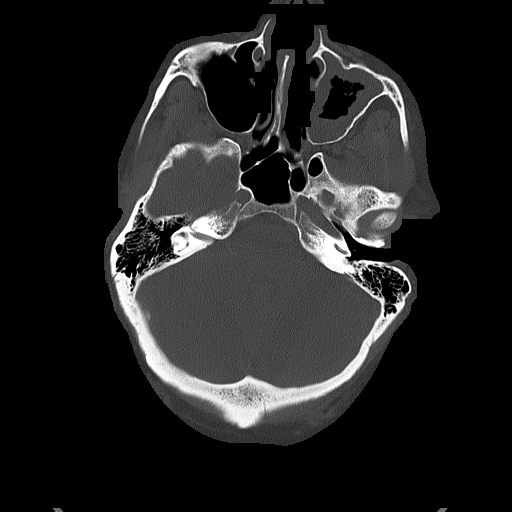
[im 25/83  bone]
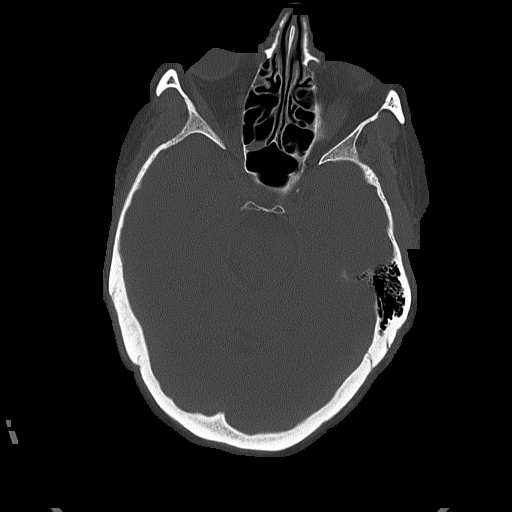

[Series 5: head without cor · coronal · non-contrast · 0.33mm/px · 3 of 74 slices shown]
[im 25/74  brain]
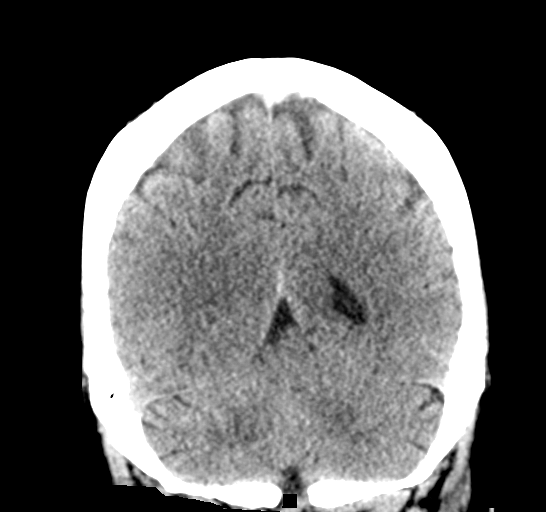
[im 33/74  brain]
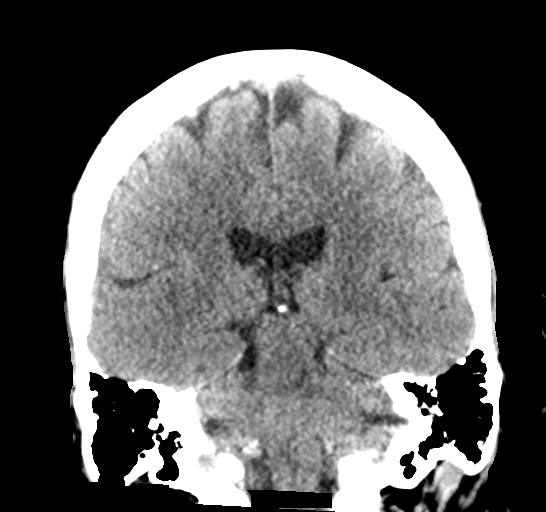
[im 41/74  brain]
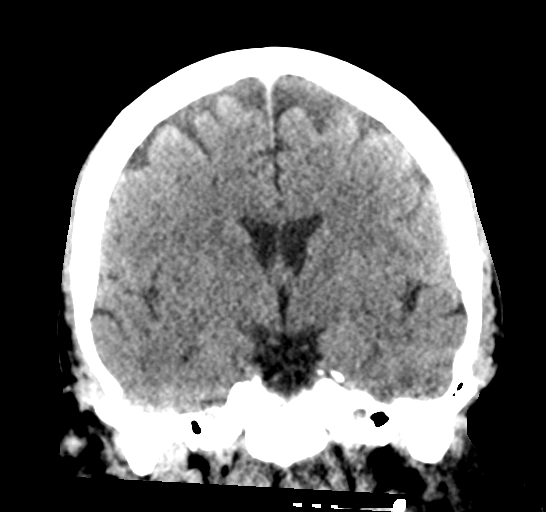

[Series 6: head without sag · sagittal · non-contrast · 0.33mm/px · 3 of 57 slices shown]
[im 19/57  brain]
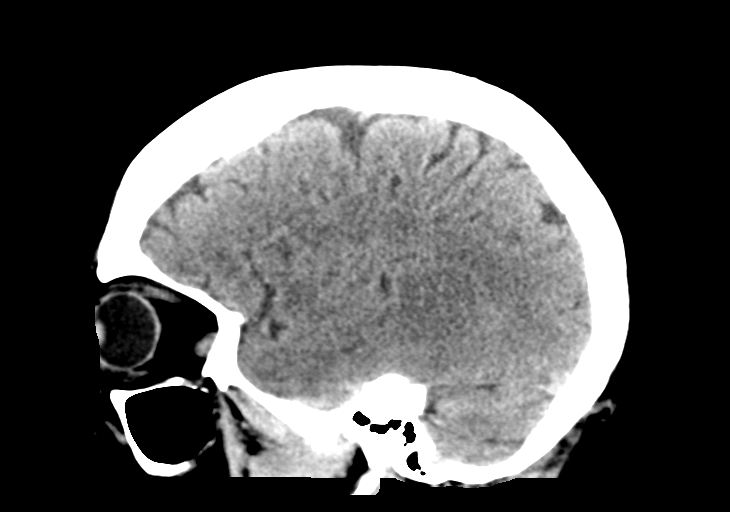
[im 29/57  brain]
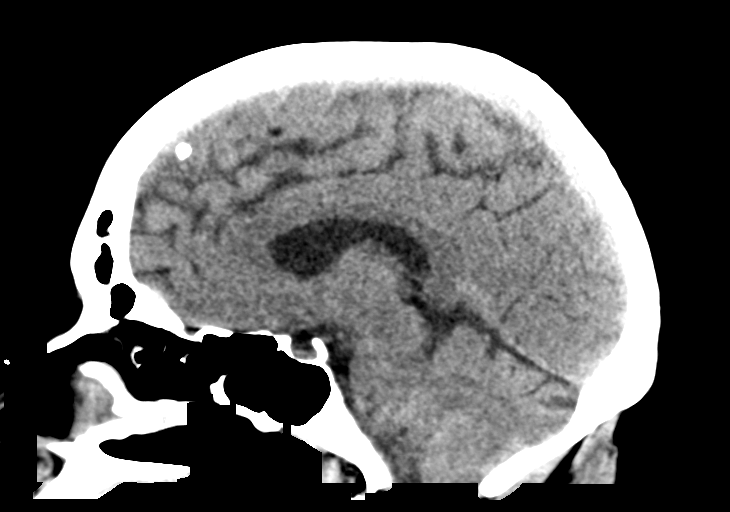
[im 38/57  brain]
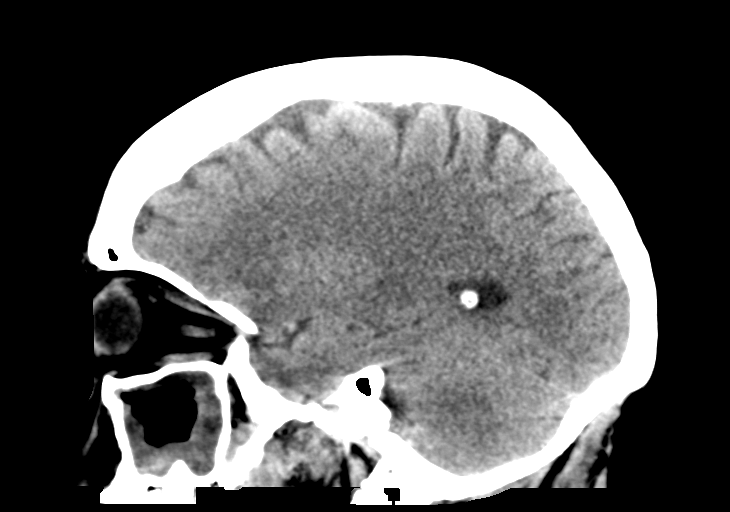

[16 of 47 positions shown; findings below may reference images not displayed]

FINDINGS: Brain: There is no acute intracranial hemorrhage or demarcated
territorial infarction. No intracranial mass, midline shift or
extra-axial collection.

Vascular: Scattered atherosclerotic calcification of the carotid
artery siphons. No definite hyperdense vessel.

Skull: No calvarial fracture.

Sinuses/Orbits: The orbits are unremarkable. Moderate mucosal
thickening and frothy secretions within the left maxillary sinus.
Small right ethmoid sinus osteoma. The mastoid air cells are well
aerated.
IMPRESSION: - No evidence of acute intracranial abnormality. Consider brain MRI
for further evaluation if persistent concern for acute infarction.

- Left maxillary sinusitis.

## 2019-10-13 ENCOUNTER — Other Ambulatory Visit: Payer: Self-pay | Admitting: Cardiology

## 2019-10-13 DIAGNOSIS — E1159 Type 2 diabetes mellitus with other circulatory complications: Secondary | ICD-10-CM

## 2019-10-16 ENCOUNTER — Other Ambulatory Visit: Payer: Self-pay

## 2019-10-16 ENCOUNTER — Telehealth (INDEPENDENT_AMBULATORY_CARE_PROVIDER_SITE_OTHER): Payer: BC Managed Care – PPO | Admitting: Cardiology

## 2019-10-16 DIAGNOSIS — I4819 Other persistent atrial fibrillation: Secondary | ICD-10-CM | POA: Diagnosis not present

## 2019-10-16 NOTE — Progress Notes (Signed)
Electrophysiology TeleHealth Note   Due to national recommendations of social distancing due to COVID 19, an audio/video telehealth visit is felt to be most appropriate for this patient at this time.  See Epic message for the patient's consent to telehealth for [facility]   Date:  10/16/2019   ID:  Laurie Mckinney, DOB 02-Dec-1960, MRN [id number]  Location: patient's home  Provider location: [address], [name]  Evaluation Performed: Follow-up visit  PCP:  Denita Lung, MD  Cardiologist:  Carlyle Dolly, MD  Electrophysiologist:  Dr [name]  Chief Complaint:  AF  History of Present Illness:    Laurie Mckinney is a 59 y.o. female who presents via audio/video conferencing for a telehealth visit today.  Since last being seen in our clinic, the patient reports doing very well.  Today, she denies symptoms of palpitations, chest pain, shortness of breath,  lower extremity edema, dizziness, presyncope, or syncope.  The patient is otherwise without complaint today.  The patient denies symptoms of fevers, chills, cough, or new SOB worrisome for COVID 19.  She has a history of hypertension, atrial flutter, Mobitz 2 AV block, and atrial fibrillation.  She also has CPAP on sleep apnea.  She has weakness fatigue and shortness of breath due to atrial fibrillation.  She is planned for AF ablation 10/27/2019  Today, denies symptoms of palpitations, chest pain, shortness of breath, orthopnea, PND, lower extremity edema, claudication, dizziness, presyncope, syncope, bleeding, or neurologic sequela. The patient is tolerating medications without difficulties.  She feels better than she did in the last 6 months.  Despite that she does think that she remains in atrial fibrillation.  Past Medical History:  Diagnosis Date  . Adjustment insomnia    SHIFT WORK  . Allergy    RHINITIS  . Asthma   . Atrial flutter [facility]) 02/26/15   [facility] [place]  . Bursitis    right hip  . Diabetes mellitus without complication ([place])   . Hypertension   . Migraine headache   . Mobitz type 2 second degree heart block   . Obesity     Past Surgical History:  Procedure Laterality Date  . ABDOMINAL HYSTERECTOMY    . CARDIOVERSION N/A 05/25/2019   Procedure: CARDIOVERSION;  Surgeon: Arnoldo Lenis, MD;  Location: [facility];  Service: Endoscopy;  Laterality: N/A;  . COLONOSCOPY  2011   Dr.Brodie  . ROBOTIC ASSISTED TOTAL HYSTERECTOMY WITH BILATERAL SALPINGO OOPHERECTOMY Bilateral 02/05/2015   Procedure: ROBOTIC ASSISTED TOTAL LAPAROSCOPIC HYSTERECTOMY WITH BILATERAL SALPINGO OOPHORECTOMY;  Surgeon: Everitt Amber, MD;  Location: [facility];  Service: Gynecology;  Laterality: Bilateral;    Current Outpatient Medications  Medication Sig Dispense Refill  . acetaminophen (TYLENOL ARTHRITIS PAIN) 650 MG CR tablet Take 650 mg by mouth every 8 (eight) hours as needed for pain.    Marland Kitchen albuterol (PROVENTIL HFA;VENTOLIN HFA) 108 (90 Base) MCG/ACT inhaler TAKE 2 PUFFS BY MOUTH EVERY 6 HOURS AS NEEDED FOR WHEEZE OR SHORTNESS OF BREATH 6.7 Inhaler 0  . Alpha-Lipoic Acid 600 MG TABS Take 1 capsule by mouth.    Marland Kitchen apixaban (ELIQUIS) 5 MG TABS tablet Take 1 tablet (5 mg total) by mouth 2 (two) times daily. 180 tablet 3  . Apple Cider Vinegar 500 MG TABS Take 500 mg by mouth 2 (two) times daily.    Marland Kitchen atorvastatin (LIPITOR) 10 MG tablet Take 1 tablet (10 mg total) by mouth daily. 90 tablet 3  . buPROPion [facility]  SR) 100 MG 12 hr tablet Take 1 tablet (100 mg total) by mouth daily. 90 tablet 1  . COLLAGEN PO Take by mouth.    Marland Kitchen lisinopril-hydrochlorothiazide (ZESTORETIC) 20-25 MG tablet Take 1 tablet by mouth daily. 90 tablet 3  . Misc Natural Products (IMMUNE FORMULA PO) Take by mouth.    . modafinil (PROVIGIL) 200 MG tablet Take 200 mg by mouth daily.    . Multiple Vitamins-Minerals (MULTIVITAMIN WITH MINERALS) tablet Take 1 tablet by mouth every morning.     Marland Kitchen oxybutynin (DITROPAN-XL) 10  MG 24 hr tablet Take 1 tablet (10 mg total) by mouth at bedtime. 90 tablet 3  . PARoxetine (PAXIL) 20 MG tablet Take 1 tablet (20 mg total) by mouth daily. 90 tablet 3   No current facility-administered medications for this visit.    Allergies:   Patient has no known allergies.   Social History:  The patient  reports that she has never smoked. She has never used smokeless tobacco. She reports that she does not drink alcohol or use drugs.   Family History:  The patient's  family history includes Arthritis in her mother; Diabetes in her brother; Heart disease in her father; Neuropathy in her brother; Other in her brother.   ROS:  Please see the history of present illness.   All other systems are personally reviewed and negative.    Exam:    Vital Signs:  BP (!) 141/72   Pulse (!) 57   Temp (!) 96.7 F (35.9 C)   LMP 01/26/2011   SpO2 96%   Well sounding, no acute distress, no shortness of breath.  Labs/Other Tests and Data Reviewed:    Recent Labs: 04/14/2019: TSH 0.600 10/05/2019: ALT 20 10/11/2019: BUN 13; Creat 0.74; Hemoglobin 14.9; Platelets 275; Potassium 4.5; Sodium 139   Wt Readings from Last 3 Encounters:  10/04/19 281 lb 6.4 oz (127.6 kg)  09/14/19 285 lb (129.3 kg)  09/05/19 287 lb (130.2 kg)     Other studies personally reviewed: Additional studies/ records that were reviewed today include: ECG 09/05/2019 personally reviewed Review of the above records today demonstrates: Atrial fibrillation    ASSESSMENT & PLAN:    1.  Persistent atrial fibrillation: Has significant conduction disease and thus other medications would not be ideal.  We Laurie Mckinney thus plan for ablation.  Risks and benefits were discussed include bleeding, tamponade, heart block, stroke, damage to chest organs.  She understands these risks and is agreed to the procedure.  2.  Mobitz 1 AV block: Avoiding AV nodal blockers.  3.  Obstructive sleep apnea: CPAP compliance encouraged.  4.  Hypertension:  Mildly elevated today.  Laurie Mckinney adjust post ablation.   COVID 19 screen The patient denies symptoms of COVID 19 at this time.  The importance of social distancing was discussed today.  Follow-up:  3 months  Current medicines are reviewed at length with the patient today.   The patient does not have concerns regarding her medicines.  The following changes were made today:  none  Labs/ tests ordered today include:  No orders of the defined types were placed in this encounter.    Patient Risk:  after full review of this patients clinical status, I feel that they are at moderate risk at this time.  Today, I have spent 9 minutes with the patient with telehealth technology discussing AF .    Signed, Laurie Mckinney Meredith Leeds, MD  10/16/2019 2:20 PM     [facility] [address] [place] [address] [phone] (office) [phone] (fax)

## 2019-10-20 ENCOUNTER — Encounter (HOSPITAL_COMMUNITY): Payer: Self-pay

## 2019-10-23 ENCOUNTER — Telehealth (HOSPITAL_COMMUNITY): Payer: Self-pay | Admitting: Emergency Medicine

## 2019-10-23 NOTE — Telephone Encounter (Signed)
Left message on voicemail with name and callback number Hogan Hoobler RN Navigator Cardiac Imaging [facility] [phone] Office [phone] Cell  

## 2019-10-24 ENCOUNTER — Other Ambulatory Visit (HOSPITAL_COMMUNITY)
Admission: RE | Admit: 2019-10-24 | Discharge: 2019-10-24 | Disposition: A | Discharge status: Home / Self Care | Payer: BC Managed Care – PPO | Source: Ambulatory Visit | Attending: Cardiology | Admitting: Cardiology

## 2019-10-24 ENCOUNTER — Other Ambulatory Visit (HOSPITAL_COMMUNITY): Payer: Managed Care, Other (non HMO)

## 2019-10-24 ENCOUNTER — Other Ambulatory Visit: Payer: Managed Care, Other (non HMO)

## 2019-10-24 ENCOUNTER — Ambulatory Visit (HOSPITAL_COMMUNITY)
Admission: RE | Admit: 2019-10-24 | Discharge: 2019-10-24 | Disposition: A | Discharge status: Home / Self Care | Payer: BC Managed Care – PPO | Source: Ambulatory Visit | Attending: Cardiology | Admitting: Cardiology

## 2019-10-24 ENCOUNTER — Other Ambulatory Visit: Payer: Self-pay

## 2019-10-24 DIAGNOSIS — I4819 Other persistent atrial fibrillation: Secondary | ICD-10-CM

## 2019-10-24 DIAGNOSIS — Z01812 Encounter for preprocedural laboratory examination: Secondary | ICD-10-CM | POA: Diagnosis not present

## 2019-10-24 DIAGNOSIS — Z20822 Contact with and (suspected) exposure to covid-19: Secondary | ICD-10-CM | POA: Diagnosis not present

## 2019-10-24 MED ORDER — IOHEXOL 350 MG/ML SOLN
80.0000 mL | Freq: Once | INTRAVENOUS | Status: AC | PRN
  Administered 2019-10-24: 80 mL via INTRAVENOUS

## 2019-10-25 DIAGNOSIS — G4733 Obstructive sleep apnea (adult) (pediatric): Secondary | ICD-10-CM | POA: Diagnosis not present

## 2019-10-25 LAB — NOVEL CORONAVIRUS, NAA (HOSP ORDER, SEND-OUT TO REF LAB; TAT 18-24 HRS): SARS-CoV-2, NAA: NOT DETECTED

## 2019-10-26 NOTE — Anesthesia Preprocedure Evaluation (Addendum)
Anesthesia Evaluation  Patient identified by MRN, date of birth, ID band Patient awake    Reviewed: Allergy & Precautions, NPO status , Patient's Chart, lab work & pertinent test results  Airway Mallampati: II       Dental   Pulmonary    breath sounds clear to auscultation       Cardiovascular hypertension, + dysrhythmias  Rhythm:Irregular Rate:Normal     Neuro/Psych    GI/Hepatic Neg liver ROS,   Endo/Other  diabetes  Renal/GU      Musculoskeletal   Abdominal   Peds  Hematology   Anesthesia Other Findings   Reproductive/Obstetrics                            Anesthesia Physical Anesthesia Plan  ASA: III  Anesthesia Plan: General   Post-op Pain Management:    Induction: Intravenous  PONV Risk Score and Plan: 3 and Ondansetron, Dexamethasone and Midazolam  Airway Management Planned: Oral ETT  Additional Equipment:   Intra-op Plan:   Post-operative Plan: Extubation in OR  Informed Consent: I have reviewed the patients History and Physical, chart, labs and discussed the procedure including the risks, benefits and alternatives for the proposed anesthesia with the patient or authorized representative who has indicated his/her understanding and acceptance.     Dental advisory given  Plan Discussed with: Anesthesiologist and CRNA  Anesthesia Plan Comments:        Anesthesia Quick Evaluation

## 2019-10-27 ENCOUNTER — Ambulatory Visit (HOSPITAL_COMMUNITY): Payer: BC Managed Care – PPO | Admitting: Anesthesiology

## 2019-10-27 ENCOUNTER — Ambulatory Visit (HOSPITAL_COMMUNITY)
Admission: RE | Admit: 2019-10-27 | Discharge: 2019-10-27 | Disposition: A | Discharge status: Home / Self Care | Payer: BC Managed Care – PPO | Attending: Cardiology | Admitting: Cardiology

## 2019-10-27 ENCOUNTER — Encounter (HOSPITAL_COMMUNITY): Payer: Self-pay | Admitting: Cardiology

## 2019-10-27 ENCOUNTER — Other Ambulatory Visit: Payer: Self-pay

## 2019-10-27 ENCOUNTER — Encounter (HOSPITAL_COMMUNITY)
Admission: RE | Disposition: A | Discharge status: Home / Self Care | Payer: PRIVATE HEALTH INSURANCE | Source: Home / Self Care | Attending: Cardiology

## 2019-10-27 DIAGNOSIS — I4819 Other persistent atrial fibrillation: Secondary | ICD-10-CM

## 2019-10-27 DIAGNOSIS — G4733 Obstructive sleep apnea (adult) (pediatric): Secondary | ICD-10-CM | POA: Insufficient documentation

## 2019-10-27 DIAGNOSIS — I10 Essential (primary) hypertension: Secondary | ICD-10-CM | POA: Insufficient documentation

## 2019-10-27 DIAGNOSIS — E669 Obesity, unspecified: Secondary | ICD-10-CM | POA: Insufficient documentation

## 2019-10-27 DIAGNOSIS — Z79899 Other long term (current) drug therapy: Secondary | ICD-10-CM | POA: Diagnosis not present

## 2019-10-27 DIAGNOSIS — Z8249 Family history of ischemic heart disease and other diseases of the circulatory system: Secondary | ICD-10-CM | POA: Diagnosis not present

## 2019-10-27 DIAGNOSIS — I4891 Unspecified atrial fibrillation: Secondary | ICD-10-CM | POA: Diagnosis not present

## 2019-10-27 DIAGNOSIS — E1140 Type 2 diabetes mellitus with diabetic neuropathy, unspecified: Secondary | ICD-10-CM | POA: Diagnosis not present

## 2019-10-27 DIAGNOSIS — Z833 Family history of diabetes mellitus: Secondary | ICD-10-CM | POA: Insufficient documentation

## 2019-10-27 DIAGNOSIS — E119 Type 2 diabetes mellitus without complications: Secondary | ICD-10-CM | POA: Insufficient documentation

## 2019-10-27 DIAGNOSIS — F5102 Adjustment insomnia: Secondary | ICD-10-CM | POA: Insufficient documentation

## 2019-10-27 DIAGNOSIS — Z7901 Long term (current) use of anticoagulants: Secondary | ICD-10-CM | POA: Insufficient documentation

## 2019-10-27 DIAGNOSIS — I441 Atrioventricular block, second degree: Secondary | ICD-10-CM | POA: Diagnosis not present

## 2019-10-27 DIAGNOSIS — J45909 Unspecified asthma, uncomplicated: Secondary | ICD-10-CM | POA: Insufficient documentation

## 2019-10-27 DIAGNOSIS — I483 Typical atrial flutter: Secondary | ICD-10-CM | POA: Insufficient documentation

## 2019-10-27 HISTORY — PX: ATRIAL FIBRILLATION ABLATION: EP1191

## 2019-10-27 LAB — POCT ACTIVATED CLOTTING TIME
Activated Clotting Time: 290 seconds
Activated Clotting Time: 296 seconds
Activated Clotting Time: 312 seconds
Activated Clotting Time: 334 seconds

## 2019-10-27 LAB — GLUCOSE, CAPILLARY
Glucose-Capillary: 137 mg/dL — ABNORMAL HIGH (ref 70–99)
Glucose-Capillary: 203 mg/dL — ABNORMAL HIGH (ref 70–99)

## 2019-10-27 SURGERY — ATRIAL FIBRILLATION ABLATION
Anesthesia: General

## 2019-10-27 MED ORDER — NEOSTIGMINE METHYLSULFATE 10 MG/10ML IV SOLN
INTRAVENOUS | Status: DC | PRN
  Administered 2019-10-27: 3 mg via INTRAVENOUS

## 2019-10-27 MED ORDER — MIDAZOLAM HCL 2 MG/2ML IJ SOLN
INTRAMUSCULAR | Status: DC | PRN
  Administered 2019-10-27: 2 mg via INTRAVENOUS

## 2019-10-27 MED ORDER — GLYCOPYRROLATE 0.2 MG/ML IJ SOLN
INTRAMUSCULAR | Status: DC | PRN
  Administered 2019-10-27: .4 mg via INTRAVENOUS

## 2019-10-27 MED ORDER — ACETAMINOPHEN 325 MG PO TABS
650.0000 mg | ORAL_TABLET | ORAL | Status: DC | PRN
  Administered 2019-10-27: 16:00:00 650 mg via ORAL
  Filled 2019-10-27: qty 2

## 2019-10-27 MED ORDER — PROTAMINE SULFATE 10 MG/ML IV SOLN
INTRAVENOUS | Status: DC | PRN
  Administered 2019-10-27: 50 mg via INTRAVENOUS

## 2019-10-27 MED ORDER — HEPARIN SODIUM (PORCINE) 1000 UNIT/ML IJ SOLN
INTRAMUSCULAR | Status: DC | PRN
  Administered 2019-10-27: 2000 [IU] via INTRAVENOUS
  Administered 2019-10-27: 5000 [IU] via INTRAVENOUS
  Administered 2019-10-27: 4000 [IU] via INTRAVENOUS
  Administered 2019-10-27: 15000 [IU] via INTRAVENOUS

## 2019-10-27 MED ORDER — FENTANYL CITRATE (PF) 250 MCG/5ML IJ SOLN
INTRAMUSCULAR | Status: DC | PRN
  Administered 2019-10-27 (×2): 50 ug via INTRAVENOUS

## 2019-10-27 MED ORDER — SODIUM CHLORIDE 0.9 % IV SOLN: INTRAVENOUS | Status: DC

## 2019-10-27 MED ORDER — HEPARIN (PORCINE) IN NACL 1000-0.9 UT/500ML-% IV SOLN
INTRAVENOUS | Status: AC
  Filled 2019-10-27: qty 500

## 2019-10-27 MED ORDER — ONDANSETRON HCL 4 MG/2ML IJ SOLN: 4.0000 mg | Freq: Four times a day (QID) | INTRAMUSCULAR | Status: DC | PRN

## 2019-10-27 MED ORDER — DOBUTAMINE IN D5W 4-5 MG/ML-% IV SOLN
INTRAVENOUS | Status: DC | PRN
  Administered 2019-10-27: 20 ug/kg/min via INTRAVENOUS

## 2019-10-27 MED ORDER — SODIUM CHLORIDE 0.9% FLUSH: 3.0000 mL | Freq: Two times a day (BID) | INTRAVENOUS | Status: DC

## 2019-10-27 MED ORDER — PHENYLEPHRINE HCL-NACL 10-0.9 MG/250ML-% IV SOLN
INTRAVENOUS | Status: DC | PRN
  Administered 2019-10-27: 25 ug/min via INTRAVENOUS

## 2019-10-27 MED ORDER — DEXAMETHASONE SODIUM PHOSPHATE 10 MG/ML IJ SOLN
INTRAMUSCULAR | Status: DC | PRN
  Administered 2019-10-27: 8 mg via INTRAVENOUS

## 2019-10-27 MED ORDER — EPHEDRINE SULFATE-NACL 50-0.9 MG/10ML-% IV SOSY
PREFILLED_SYRINGE | INTRAVENOUS | Status: DC | PRN
  Administered 2019-10-27: 10 mg via INTRAVENOUS

## 2019-10-27 MED ORDER — LIDOCAINE 2% (20 MG/ML) 5 ML SYRINGE
INTRAMUSCULAR | Status: DC | PRN
  Administered 2019-10-27: 100 mg via INTRAVENOUS

## 2019-10-27 MED ORDER — HEPARIN (PORCINE) IN NACL 1000-0.9 UT/500ML-% IV SOLN
INTRAVENOUS | Status: DC | PRN
  Administered 2019-10-27 (×5): 500 mL

## 2019-10-27 MED ORDER — SODIUM CHLORIDE 0.9% FLUSH: 3.0000 mL | INTRAVENOUS | Status: DC | PRN

## 2019-10-27 MED ORDER — SODIUM CHLORIDE 0.9 % IV SOLN: 250.0000 mL | INTRAVENOUS | Status: DC | PRN

## 2019-10-27 MED ORDER — HEPARIN SODIUM (PORCINE) 1000 UNIT/ML IJ SOLN
INTRAMUSCULAR | Status: DC | PRN
  Administered 2019-10-27: 1000 [IU] via INTRAVENOUS

## 2019-10-27 MED ORDER — ONDANSETRON HCL 4 MG/2ML IJ SOLN
INTRAMUSCULAR | Status: DC | PRN
  Administered 2019-10-27: 4 mg via INTRAVENOUS

## 2019-10-27 MED ORDER — PHENYLEPHRINE 40 MCG/ML (10ML) SYRINGE FOR IV PUSH (FOR BLOOD PRESSURE SUPPORT)
PREFILLED_SYRINGE | INTRAVENOUS | Status: DC | PRN
  Administered 2019-10-27: 40 ug via INTRAVENOUS
  Administered 2019-10-27 (×3): 80 ug via INTRAVENOUS
  Administered 2019-10-27: 120 ug via INTRAVENOUS

## 2019-10-27 MED ORDER — BUPIVACAINE HCL (PF) 0.25 % IJ SOLN
INTRAMUSCULAR | Status: DC | PRN
  Administered 2019-10-27: 30 mL

## 2019-10-27 MED ORDER — HEPARIN SODIUM (PORCINE) 1000 UNIT/ML IJ SOLN
INTRAMUSCULAR | Status: AC
  Filled 2019-10-27: qty 1

## 2019-10-27 MED ORDER — ROCURONIUM BROMIDE 10 MG/ML (PF) SYRINGE
PREFILLED_SYRINGE | INTRAVENOUS | Status: DC | PRN
  Administered 2019-10-27: 50 mg via INTRAVENOUS

## 2019-10-27 MED ORDER — BUPIVACAINE HCL (PF) 0.25 % IJ SOLN
INTRAMUSCULAR | Status: AC
  Filled 2019-10-27: qty 30

## 2019-10-27 MED ORDER — DOBUTAMINE IN D5W 4-5 MG/ML-% IV SOLN
INTRAVENOUS | Status: AC
  Filled 2019-10-27: qty 250

## 2019-10-27 MED ORDER — PROPOFOL 10 MG/ML IV BOLUS
INTRAVENOUS | Status: DC | PRN
  Administered 2019-10-27: 150 mg via INTRAVENOUS
  Administered 2019-10-27 (×2): 10 mg via INTRAVENOUS

## 2019-10-27 SURGICAL SUPPLY — 22 items
BLANKET WARM UNDERBOD FULL ACC (MISCELLANEOUS) ×3 IMPLANT
CATH 8FR REPROCESSED SOUNDSTAR (CATHETERS) ×3 IMPLANT
CATH MAPPNG PENTARAY F 2-6-2MM (CATHETERS) ×1 IMPLANT
CATH SMTCH THERMOCOOL SF DF (CATHETERS) ×3 IMPLANT
CATH WEBSTER BI DIR CS D-F CRV (CATHETERS) ×3 IMPLANT
COVER SWIFTLINK CONNECTOR (BAG) ×3 IMPLANT
DEVICE CLOSURE PERCLS PRGLD 6F (VASCULAR PRODUCTS) ×4 IMPLANT
PACK EP LATEX FREE (CUSTOM PROCEDURE TRAY) ×3
PACK EP LF (CUSTOM PROCEDURE TRAY) ×1 IMPLANT
PAD PRO RADIOLUCENT 2001M-C (PAD) ×3 IMPLANT
PATCH CARTO3 (PAD) ×3 IMPLANT
PENTARAY F 2-6-2MM (CATHETERS) ×3
PERCLOSE PROGLIDE 6F (VASCULAR PRODUCTS) ×12
SHEATH BAYLIS SUREFLEX  M 8.5 (SHEATH) ×2
SHEATH BAYLIS SUREFLEX M 8.5 (SHEATH) ×1 IMPLANT
SHEATH BAYLIS TRANSSEPTAL 98CM (NEEDLE) ×3 IMPLANT
SHEATH CARTO VIZIGO SM CVD (SHEATH) ×3 IMPLANT
SHEATH PINNACLE 7F 10CM (SHEATH) ×3 IMPLANT
SHEATH PINNACLE 8F 10CM (SHEATH) ×6 IMPLANT
SHEATH PINNACLE 9F 10CM (SHEATH) ×3 IMPLANT
SHEATH PROBE COVER 6X72 (BAG) ×3 IMPLANT
TUBING SMART ABLATE COOLFLOW (TUBING) ×3 IMPLANT

## 2019-10-27 NOTE — Anesthesia Postprocedure Evaluation (Signed)
Anesthesia Post Note  Patient: Laurie Mckinney  Procedure(s) Performed: ATRIAL FIBRILLATION ABLATION (N/A )     Patient location during evaluation: PACU Anesthesia Type: General Level of consciousness: awake Pain management: pain level controlled Vital Signs Assessment: post-procedure vital signs reviewed and stable Respiratory status: spontaneous breathing Cardiovascular status: stable Postop Assessment: no apparent nausea or vomiting Anesthetic complications: no    Last Vitals:  Vitals:   10/27/19 1245 10/27/19 1300  BP: 121/78 132/86  Pulse: 86 81  Resp: 15 17  Temp:    SpO2: (!) 89% 91%    Last Pain:  Vitals:   10/27/19 1208  TempSrc:   PainSc: 0-No pain                 Dawsyn Ramsaran

## 2019-10-27 NOTE — H&P (Signed)
Laurie Mckinney has presented today for surgery, with the diagnosis of atrial fibrillation.  The various methods of treatment have been discussed with the patient and family. After consideration of risks, benefits and other options for treatment, the patient has consented to  Procedure(s): Catheter ablation as a surgical intervention .  Risks include but not limited to bleeding, tamponade, heart block, stroke, damage to surrounding organs, among others. The patient's history has been reviewed, patient examined, no change in status, stable for surgery.  I have reviewed the patient's chart and labs.  Questions were answered to the patient's satisfaction.    Lamoyne Hessel [name], MD 10/27/2019 7:10 AM

## 2019-10-27 NOTE — Discharge Instructions (Signed)

## 2019-10-27 NOTE — Transfer of Care (Signed)
Immediate Anesthesia Transfer of Care Note  Patient: Laurie Mckinney  Procedure(s) Performed: ATRIAL FIBRILLATION ABLATION (N/A )  Patient Location: PACU and Cath Lab  Anesthesia Type:General  Level of Consciousness: patient cooperative and responds to stimulation  Airway & Oxygen Therapy: Patient Spontanous Breathing and Patient connected to face mask oxygen  Post-op Assessment: Report given to RN and Post -op Vital signs reviewed and stable  Post vital signs: Reviewed and stable  Last Vitals:  Vitals Value Taken Time  BP 112/67 10/27/19 1107  Temp 36.4 C 10/27/19 1104  Pulse 72 10/27/19 1110  Resp 21 10/27/19 1110  SpO2 97 % 10/27/19 1110  Vitals shown include unvalidated device data.  Last Pain:  Vitals:   10/27/19 1104  TempSrc: Temporal  PainSc: Asleep      Patients Stated Pain Goal: 7 ([id number])  Complications: No apparent anesthesia complications

## 2019-10-30 DIAGNOSIS — H35033 Hypertensive retinopathy, bilateral: Secondary | ICD-10-CM | POA: Diagnosis not present

## 2019-10-30 LAB — HM DIABETES EYE EXAM

## 2019-11-02 ENCOUNTER — Ambulatory Visit: Payer: Managed Care, Other (non HMO) | Admitting: Family Medicine

## 2019-11-05 DIAGNOSIS — Z1211 Encounter for screening for malignant neoplasm of colon: Secondary | ICD-10-CM | POA: Diagnosis not present

## 2019-11-07 LAB — COLOGUARD: Cologuard: NEGATIVE

## 2019-11-09 ENCOUNTER — Encounter: Payer: Self-pay | Admitting: *Deleted

## 2019-11-09 LAB — COLOGUARD: COLOGUARD: NEGATIVE

## 2019-11-10 NOTE — Progress Notes (Signed)
LVM for pt to call back for any concerns . [place]

## 2019-11-22 ENCOUNTER — Encounter: Payer: Self-pay | Admitting: Family Medicine

## 2019-11-22 ENCOUNTER — Other Ambulatory Visit: Payer: Self-pay

## 2019-11-22 ENCOUNTER — Encounter (HOSPITAL_COMMUNITY): Payer: Self-pay | Admitting: Physician Assistant

## 2019-11-22 ENCOUNTER — Ambulatory Visit: Payer: BC Managed Care – PPO | Admitting: Family Medicine

## 2019-11-22 ENCOUNTER — Ambulatory Visit (HOSPITAL_COMMUNITY)
Admission: RE | Admit: 2019-11-22 | Discharge: 2019-11-22 | Disposition: A | Discharge status: Home / Self Care | Payer: BC Managed Care – PPO | Source: Ambulatory Visit | Attending: Physician Assistant | Admitting: Physician Assistant

## 2019-11-22 ENCOUNTER — Telehealth: Payer: Self-pay

## 2019-11-22 VITALS — BP 114/78 | Wt 272.0 lb

## 2019-11-22 VITALS — BP 114/78 | HR 73 | Ht 71.0 in | Wt 272.0 lb

## 2019-11-22 DIAGNOSIS — Z79899 Other long term (current) drug therapy: Secondary | ICD-10-CM | POA: Insufficient documentation

## 2019-11-22 DIAGNOSIS — Z7901 Long term (current) use of anticoagulants: Secondary | ICD-10-CM | POA: Diagnosis not present

## 2019-11-22 DIAGNOSIS — D6869 Other thrombophilia: Secondary | ICD-10-CM

## 2019-11-22 DIAGNOSIS — E119 Type 2 diabetes mellitus without complications: Secondary | ICD-10-CM | POA: Diagnosis not present

## 2019-11-22 DIAGNOSIS — R300 Dysuria: Secondary | ICD-10-CM | POA: Diagnosis not present

## 2019-11-22 DIAGNOSIS — I4892 Unspecified atrial flutter: Secondary | ICD-10-CM | POA: Insufficient documentation

## 2019-11-22 DIAGNOSIS — Z8249 Family history of ischemic heart disease and other diseases of the circulatory system: Secondary | ICD-10-CM | POA: Diagnosis not present

## 2019-11-22 DIAGNOSIS — Z6837 Body mass index (BMI) 37.0-37.9, adult: Secondary | ICD-10-CM | POA: Diagnosis not present

## 2019-11-22 DIAGNOSIS — I10 Essential (primary) hypertension: Secondary | ICD-10-CM | POA: Diagnosis not present

## 2019-11-22 DIAGNOSIS — B9689 Other specified bacterial agents as the cause of diseases classified elsewhere: Secondary | ICD-10-CM | POA: Diagnosis not present

## 2019-11-22 DIAGNOSIS — N3000 Acute cystitis without hematuria: Secondary | ICD-10-CM

## 2019-11-22 DIAGNOSIS — I441 Atrioventricular block, second degree: Secondary | ICD-10-CM | POA: Insufficient documentation

## 2019-11-22 DIAGNOSIS — E669 Obesity, unspecified: Secondary | ICD-10-CM | POA: Diagnosis not present

## 2019-11-22 DIAGNOSIS — G4733 Obstructive sleep apnea (adult) (pediatric): Secondary | ICD-10-CM | POA: Insufficient documentation

## 2019-11-22 DIAGNOSIS — I4819 Other persistent atrial fibrillation: Secondary | ICD-10-CM | POA: Insufficient documentation

## 2019-11-22 MED ORDER — SULFAMETHOXAZOLE-TRIMETHOPRIM 800-160 MG PO TABS: 1.0000 | ORAL_TABLET | Freq: Two times a day (BID) | ORAL | 0 refills | Status: DC

## 2019-11-22 NOTE — Telephone Encounter (Signed)
Spoke to pt and schedule pt for virtual with Vickie for possible UTI. Laurie Mckinney

## 2019-11-22 NOTE — Progress Notes (Addendum)
Primary Care Physician: Denita Lung, MD Primary Cardiologist: Dr Harl Bowie Primary Electrophysiologist: Taylor/Camnitz Referring Physician: Chana Bode PA-C   Laurie Mckinney is a 59 y.o. female with a history of DM, HTN, second degree AV block type I, atrial flutter and atrial fibrillation who presents for consultation in the [facility].  The patient was initially diagnosed with atrial fibrillation on ECG at her PCP office after presenting with sudden onset of gait instability, difficulty finding words, falls, and intermittent confusion three weeks ago. Husband reports that she was in her usual state of health just prior to her present symptoms. She does have a history of atrial flutter in 2016 post operatively. Workup at ER negative for acute stroke. Seen by neurology and symptoms appear to have a large emotional component. She is on Eliquis for a CHADS2VASC score of 3.   On follow up today, patient is s/p afib ablation with Dr [name] on 10/27/19. Patient reports that since her ablation and starting on CPAP, she feels so much better. She has more energy and clarity of thought. She has not had any symptoms of afib since the ablation. She denies CP, swallowing, or groin issues.   Today, she denies symptoms of palpitations, chest pain, shortness of breath, orthopnea, PND, lower extremity edema, presyncope, syncope, snoring, daytime somnolence, bleeding. The patient is tolerating medications without difficulties and is otherwise without complaint today.   Atrial Fibrillation Risk Factors:  She does have a diagnosis of sleep apnea.  She is compliant with CPAP therapy.  she does not have a history of alcohol use.  she has a BMI of Body mass index is 37.94 kg/m.Marland Kitchen Filed Weights   11/22/19 1136  Weight: 123.4 kg    Family History  Problem Relation Age of Onset  . Arthritis Mother   . Heart disease Father   . Neuropathy Brother        both legs   .  Diabetes Brother        no treatment that the pt knows of   . Other Brother        drank a 6 pack of beer daily x 30 years      Atrial Fibrillation Management history:  Previous antiarrhythmic drugs: none Previous cardioversions: none Previous ablations: 10/27/19 CHADS2VASC score: 3 (DM, HTN, female) Anticoagulation history: Eliquis   Past Medical History:  Diagnosis Date  . Adjustment insomnia    SHIFT WORK  . Allergy    RHINITIS  . Asthma   . Atrial flutter [facility]) 02/26/15   [facility] [place]  . Bursitis    right hip  . Diabetes mellitus without complication ([place])   . Hypertension   . Migraine headache   . Mobitz type 2 second degree heart block   . Obesity    Past Surgical History:  Procedure Laterality Date  . ABDOMINAL HYSTERECTOMY    . ATRIAL FIBRILLATION ABLATION N/A 10/27/2019   Procedure: ATRIAL FIBRILLATION ABLATION;  Surgeon: Constance Haw, MD;  [facility];  Service: Cardiovascular;  Laterality: N/A;  . CARDIOVERSION N/A 05/25/2019   Procedure: CARDIOVERSION;  Surgeon: Arnoldo Lenis, MD;  Location: [facility];  Service: Endoscopy;  Laterality: N/A;  . COLONOSCOPY  2011   Dr.Brodie  . ROBOTIC ASSISTED TOTAL HYSTERECTOMY WITH BILATERAL SALPINGO OOPHERECTOMY Bilateral 02/05/2015   Procedure: ROBOTIC ASSISTED TOTAL LAPAROSCOPIC HYSTERECTOMY WITH BILATERAL SALPINGO OOPHORECTOMY;  Surgeon: Everitt Amber, MD;  Location: [facility];  Service: Gynecology;  Laterality:  Bilateral;    Current Outpatient Medications  Medication Sig Dispense Refill  . acetaminophen (TYLENOL ARTHRITIS PAIN) 650 MG CR tablet Take 650 mg by mouth every 8 (eight) hours as needed for pain.    . Alpha-Lipoic Acid 600 MG TABS Take 600 mg by mouth daily.     Marland Kitchen apixaban (ELIQUIS) 5 MG TABS tablet Take 1 tablet (5 mg total) by mouth 2 (two) times daily. 180 tablet 3  . Apple Cider Vinegar 500 MG TABS Take 500 mg by mouth 2 (two) times daily. Gummie    . atorvastatin  (LIPITOR) 10 MG tablet Take 1 tablet (10 mg total) by mouth daily. 90 tablet 3  . buPROPion (WELLBUTRIN SR) 100 MG 12 hr tablet Take 1 tablet (100 mg total) by mouth daily. 90 tablet 1  . COLLAGEN PO Take 100 mg by mouth daily.     Marland Kitchen lisinopril-hydrochlorothiazide (ZESTORETIC) 20-25 MG tablet Take 1 tablet by mouth daily. 90 tablet 3  . Misc Natural Products (IMMUNE FORMULA PO) Take 2 tablets by mouth daily. immuneti    . Multiple Vitamins-Minerals (MULTIVITAMIN WITH MINERALS) tablet Take 1 tablet by mouth daily. Gummie 50+    . oxybutynin (DITROPAN-XL) 10 MG 24 hr tablet Take 1 tablet (10 mg total) by mouth at bedtime. 90 tablet 3  . PARoxetine (PAXIL) 20 MG tablet Take 1 tablet (20 mg total) by mouth daily. 90 tablet 3   No current facility-administered medications for this encounter.    No Known Allergies  Social History   Socioeconomic History  . Marital status: Married    Spouse name: Not on file  . Number of children: Not on file  . Years of education: Not on file  . Highest education level: Not on file  Occupational History  . Not on file  Tobacco Use  . Smoking status: Never Smoker  . Smokeless tobacco: Never Used  Substance and Sexual Activity  . Alcohol use: Yes    Alcohol/week: 1.0 standard drinks    Types: 1 Standard drinks or equivalent per week  . Drug use: No  . Sexual activity: Yes  Other Topics Concern  . Not on file  Social History Narrative   Lives at home with husband   Right handed   Caffeine: 1 cup of coffee in the morning and <20 oz of soda per day   Social Determinants of Health   Financial Resource Strain:   . Difficulty of Paying Living Expenses: Not on file  Food Insecurity:   . Worried About Charity fundraiser in the Last Year: Not on file  . Ran Out of Food in the Last Year: Not on file  Transportation Needs:   . Lack of Transportation (Medical): Not on file  . Lack of Transportation (Non-Medical): Not on file  Physical Activity:   .  Days of Exercise per Week: Not on file  . Minutes of Exercise per Session: Not on file  Stress:   . Feeling of Stress : Not on file  Social Connections:   . Frequency of Communication with Friends and Family: Not on file  . Frequency of Social Gatherings with Friends and Family: Not on file  . Attends Religious Services: Not on file  . Active Member of Clubs or Organizations: Not on file  . Attends Archivist Meetings: Not on file  . Marital Status: Not on file  Intimate Partner Violence:   . Fear of Current or Ex-Partner: Not on file  . Emotionally  Abused: Not on file  . Physically Abused: Not on file  . Sexually Abused: Not on file     ROS- All systems are reviewed and negative except as per the HPI above.  Physical Exam: Vitals:   11/22/19 1136  BP: 114/78  Pulse: 73  Weight: 123.4 kg  Height: 5\' 11"  (1.803 m)    GEN- The patient is well appearing obese female, alert and oriented x 3 today.   HEENT-head normocephalic, atraumatic, sclera clear, conjunctiva pink, hearing intact, trachea midline. Lungs- Clear to ausculation bilaterally, normal work of breathing Heart- Regular rate and rhythm, no murmurs, rubs or gallops  GI- soft, NT, ND, + BS Extremities- no clubbing, cyanosis, or edema MS- no significant deformity or atrophy Skin- no rash or lesion Psych- euthymic mood, full affect Neuro- strength and sensation are intact   Wt Readings from Last 3 Encounters:  11/22/19 123.4 kg  10/27/19 127 kg  10/04/19 127.6 kg    EKG today demonstrates SR HR 73, marked 1st degree AV block, PR 360, QRS 78, QTc 425  Echo 06/07/19 demonstrated  1. The left ventricle has normal systolic function, with an ejection  fraction of 55-60%. The cavity size was normal. Left ventricular diastolic  Doppler parameters are indeterminate.  2. The right ventricle has normal systolic function. The cavity was  normal. There is no increase in right ventricular wall thickness.  3.  Left atrial size was mildly dilated.  4. No evidence of mitral valve stenosis.  5. The aortic valve has an indeterminate number of cusps. No stenosis of  the aortic valve.  6. The aorta is normal unless otherwise noted.  7. The aortic root is normal in size and structure.  8. Pulmonary hypertension is indeterminant, inadequate TR jet.  9. The interatrial septum was not well visualized.   Epic records are reviewed at length today  Assessment and Plan:  1. Persistent Atrial fibrillation/atrial flutter S/p afib ablation with Dr [name] 10/27/19. Patient appears to be maintaining SR. Continue Eliquis 5 mg BID  This patients CHA2DS2-VASc Score and unadjusted Ischemic Stroke Rate (% per year) is equal to 3.2 % stroke rate/year from a score of 3  Above score calculated as 1 point each if present [CHF, HTN, DM, Vascular=MI/PAD/Aortic Plaque, Age if 65-74, or Female] Above score calculated as 2 points each if present [Age > 75, or Stroke/TIA/TE]  2. Second degree heart block type I First noted in 2016, has been asymptomatic. Limiting medical afib therapy.  3. HTN Stable, no changes today.  4. OSA The importance of adequate treatment of sleep apnea was discussed today in order to improve our ability to maintain sinus rhythm long term. Patient compliant with CPAP.   Follow up with Dr [name] as scheduled.   [facility] [address] [place], [place] [address] [phone] 11/22/2019 12:11 PM

## 2019-11-22 NOTE — Progress Notes (Signed)
   Subjective:  Documentation for virtual audio and video telecommunications through [place] encounter:  The patient was located at home. 2 patient identifiers used.  The provider was located in the office. The patient did consent to this visit and is aware of possible charges through their insurance for this visit.  The other persons participating in this telemedicine service were none.    Patient ID: Laurie Mckinney, female    DOB: [birth year], 59 y.o.   MRN: [id number]  HPI Chief Complaint  Patient presents with  . other    possible UTI started yesterday with freq.    Complains of a 24 hour history of urinary frequency and urgency. States she started having pain with urination earlier this morning.  States she had sex and not sure if she urinated afterwards.  Thinks this is probably the trigger since it has been in the past.  Last UTI several months/years ago.  Denies history of pyelonephritis  Denies fever, chills, abdominal pain, back pain, N/V/D.    Review of Systems Pertinent positives and negatives in the history of present illness.     Objective:   Physical Exam BP 114/78   Wt 272 lb (123.4 kg)   LMP 01/26/2011   BMI 37.94 kg/m   Alert and oriented and in no acute distress.  Respirations unlabored.  Normal speech, mood and thought process.      Assessment & Plan:  Acute cystitis without hematuria  Dysuria - Plan: sulfamethoxazole-trimethoprim (BACTRIM DS) 800-160 MG tablet  No red flag symptoms.  Discussed that I will treat her empirically for UTI.  Recommend that she increase her water intake and start on the antibiotic.  She may take over-the-counter Azo-Standard if she would like, she has done this in the past.  Encouraged her to only take it for 24 to 48 hours and then stop. She will let me know if she has any worsening symptoms or if she is not back to baseline when she completes the antibiotic.  Discussed that we would want to get a urine sample if she  is not 100% back to her usual state of health.  Time spent on call was 12 minutes and in review of previous records 50 minutes total.  This virtual service is not related to other E/M service within previous 7 days.

## 2019-11-23 ENCOUNTER — Ambulatory Visit (HOSPITAL_COMMUNITY): Payer: PRIVATE HEALTH INSURANCE | Admitting: Physician Assistant

## 2019-11-25 DIAGNOSIS — G4733 Obstructive sleep apnea (adult) (pediatric): Secondary | ICD-10-CM | POA: Diagnosis not present

## 2019-12-23 DIAGNOSIS — G4733 Obstructive sleep apnea (adult) (pediatric): Secondary | ICD-10-CM | POA: Diagnosis not present

## 2019-12-28 DIAGNOSIS — Z23 Encounter for immunization: Secondary | ICD-10-CM | POA: Diagnosis not present

## 2020-01-03 ENCOUNTER — Encounter: Payer: Self-pay | Admitting: Family Medicine

## 2020-01-03 DIAGNOSIS — R32 Unspecified urinary incontinence: Secondary | ICD-10-CM

## 2020-01-03 MED ORDER — OXYBUTYNIN CHLORIDE ER 10 MG PO TB24: 10.0000 mg | ORAL_TABLET | Freq: Every day | ORAL | 3 refills | Status: DC

## 2020-01-15 ENCOUNTER — Encounter: Payer: Self-pay | Admitting: Family Medicine

## 2020-01-15 ENCOUNTER — Other Ambulatory Visit: Payer: Self-pay

## 2020-01-15 ENCOUNTER — Ambulatory Visit: Payer: BC Managed Care – PPO | Admitting: Family Medicine

## 2020-01-15 VITALS — HR 56 | Temp 98.2°F | Wt 278.2 lb

## 2020-01-15 DIAGNOSIS — Z8679 Personal history of other diseases of the circulatory system: Secondary | ICD-10-CM

## 2020-01-15 DIAGNOSIS — E119 Type 2 diabetes mellitus without complications: Secondary | ICD-10-CM | POA: Diagnosis not present

## 2020-01-15 DIAGNOSIS — R519 Headache, unspecified: Secondary | ICD-10-CM

## 2020-01-15 DIAGNOSIS — M545 Low back pain: Secondary | ICD-10-CM

## 2020-01-15 DIAGNOSIS — E785 Hyperlipidemia, unspecified: Secondary | ICD-10-CM

## 2020-01-15 DIAGNOSIS — G8929 Other chronic pain: Secondary | ICD-10-CM

## 2020-01-15 DIAGNOSIS — E1159 Type 2 diabetes mellitus with other circulatory complications: Secondary | ICD-10-CM

## 2020-01-15 DIAGNOSIS — I10 Essential (primary) hypertension: Secondary | ICD-10-CM

## 2020-01-15 DIAGNOSIS — E1169 Type 2 diabetes mellitus with other specified complication: Secondary | ICD-10-CM

## 2020-01-15 LAB — POCT URINALYSIS DIP (PROADVANTAGE DEVICE)
Bilirubin, UA: NEGATIVE
Blood, UA: NEGATIVE
Glucose, UA: NEGATIVE mg/dL
Ketones, POC UA: NEGATIVE mg/dL
Leukocytes, UA: NEGATIVE
Nitrite, UA: NEGATIVE
Protein Ur, POC: NEGATIVE mg/dL
Specific Gravity, Urine: 1.015
Urobilinogen, Ur: 0.2
pH, UA: 6 (ref 5.0–8.0)

## 2020-01-15 LAB — POCT GLYCOSYLATED HEMOGLOBIN (HGB A1C): Hemoglobin A1C: 6.5 % — AB (ref 4.0–5.6)

## 2020-01-15 NOTE — Progress Notes (Signed)
   Subjective:    Patient ID: Laurie Mckinney, female    DOB: [birth year], 59 y.o.   MRN: [id number]  HPI He is here for evaluation of headache, fatigue, malaise.  Headache is in the frontal area, intermittent in nature lasting roughly 30 minutes and can be there all day.  No cough, sneezing, postnasal drainage.  She also continues have difficulty with low back pain but seems to be handling this with appropriate amounts of rest.  She does have an underlying history of atrial fib and recently had an ablation.  She continues on her blood pressure medications as well as statin without difficulty.  Apparently her glucometer is not functioning properly and she needs a replacement.   Review of Systems     Objective:   Physical Exam Alert and in no distress.  No tenderness over frontal sinuses.  Tympanic membranes and canals are normal. Pharyngeal area is normal. Neck is supple without adenopathy or thyromegaly. Cardiac exam shows a regular sinus rhythm without murmurs or gallops. Lungs are clear to auscultation. Hemoglobin A1c is 6.5.       Assessment & Plan:  Headache above the eye region  Type 2 diabetes mellitus without complication, without long-term current use of insulin (HCC) - Plan: POCT Urinalysis DIP (Proadvantage Device), POCT glycosylated hemoglobin (Hb A1C)  History of atrial flutter  Chronic bilateral low back pain without sciatica  Hypertension associated with diabetes ([place])  Hyperlipidemia associated with type 2 diabetes mellitus ([place]) She is to continue on her present medication regimen.  I congratulated her on the fact that her heart is in a regular rhythm.  Recommend conservative care for her back.  Recommend 2 Tylenol 4 times per day on a regular basis to help with a headache to see how this will do.  She will return here if she continues to have difficulty.

## 2020-01-17 ENCOUNTER — Encounter: Payer: Self-pay | Admitting: Family Medicine

## 2020-01-18 ENCOUNTER — Other Ambulatory Visit: Payer: Self-pay

## 2020-01-18 MED ORDER — GLUCOSE BLOOD VI STRP: 1.0000 | ORAL_STRIP | 3 refills | Status: DC | PRN

## 2020-01-18 MED ORDER — ONETOUCH DELICA LANCETS 33G MISC: 1.0000 | Freq: Every day | 3 refills | Status: DC

## 2020-01-20 DIAGNOSIS — Z23 Encounter for immunization: Secondary | ICD-10-CM | POA: Diagnosis not present

## 2020-01-22 DIAGNOSIS — G4733 Obstructive sleep apnea (adult) (pediatric): Secondary | ICD-10-CM | POA: Diagnosis not present

## 2020-01-23 DIAGNOSIS — G4733 Obstructive sleep apnea (adult) (pediatric): Secondary | ICD-10-CM | POA: Diagnosis not present

## 2020-01-25 ENCOUNTER — Ambulatory Visit: Payer: 59 | Admitting: Family Medicine

## 2020-01-29 ENCOUNTER — Other Ambulatory Visit: Payer: Self-pay

## 2020-01-29 ENCOUNTER — Encounter: Payer: Self-pay | Admitting: Cardiology

## 2020-01-29 ENCOUNTER — Ambulatory Visit (INDEPENDENT_AMBULATORY_CARE_PROVIDER_SITE_OTHER): Payer: BC Managed Care – PPO | Admitting: Cardiology

## 2020-01-29 VITALS — BP 128/94 | HR 67 | Ht 71.0 in | Wt 281.0 lb

## 2020-01-29 DIAGNOSIS — I4819 Other persistent atrial fibrillation: Secondary | ICD-10-CM | POA: Diagnosis not present

## 2020-01-29 NOTE — Progress Notes (Signed)
Electrophysiology Office Note   Date:  01/29/2020   ID:  Keymani, Brockschmidt 1961/09/30, MRN [id number]  PCP:  Denita Lung, MD  Cardiologist:  Harl Bowie Primary Electrophysiologist: Gaye Alken, MD    Chief Complaint: AF   History of Present Illness: Laurie Mckinney is a 59 y.o. female who is being seen today for the evaluation of AF at the request of Cristopher Peru. Presenting today for electrophysiology evaluation.  She has a history of atrial flutter, hypertension, Mobitz 2 AV block, and atrial fibrillation.  She had a recent cardioversion, but unfortunately had a rash.  She has sleep apnea and is on CPAP currently.  Her main symptoms from her atrial fibrillation are weakness, fatigue, shortness of breath.  She is now post AF ablation on 10/27/2019.  Today, denies symptoms of palpitations, chest pain, shortness of breath, orthopnea, PND, lower extremity edema, claudication, dizziness, presyncope, syncope, bleeding, or neurologic sequela. The patient is tolerating medications without difficulties.  She has not had any further episodes of atrial fibrillation.  She is felt well since her ablation.  She is continuing to worry about her weight.  She, at this point, understands that she needs to lose weight.  She understands that this is a lifestyle change.  She is working on the motivation to not eat comfort food and to exercise.   Past Medical History:  Diagnosis Date  . Adjustment insomnia    SHIFT WORK  . Allergy    RHINITIS  . Asthma   . Atrial flutter [facility]) 02/26/15   [facility] [place]  . Bursitis    right hip  . Diabetes mellitus without complication ([place])   . Hypertension   . Migraine headache   . Mobitz type 2 second degree heart block   . Obesity    Past Surgical History:  Procedure Laterality Date  . ABDOMINAL HYSTERECTOMY    . ATRIAL FIBRILLATION ABLATION N/A 10/27/2019   Procedure: ATRIAL FIBRILLATION ABLATION;  Surgeon: Constance Haw, MD;  Location: [place] CV LAB;  Service: Cardiovascular;  Laterality: N/A;  . CARDIOVERSION N/A 05/25/2019   Procedure: CARDIOVERSION;  Surgeon: Arnoldo Lenis, MD;  Location: [facility];  Service: Endoscopy;  Laterality: N/A;  . COLONOSCOPY  2011   Dr.Brodie  . ROBOTIC ASSISTED TOTAL HYSTERECTOMY WITH BILATERAL SALPINGO OOPHERECTOMY Bilateral 02/05/2015   Procedure: ROBOTIC ASSISTED TOTAL LAPAROSCOPIC HYSTERECTOMY WITH BILATERAL SALPINGO OOPHORECTOMY;  Surgeon: Everitt Amber, MD;  [facility];  Service: Gynecology;  Laterality: Bilateral;     Current Outpatient Medications  Medication Sig Dispense Refill  . acetaminophen (TYLENOL ARTHRITIS PAIN) 650 MG CR tablet Take 650 mg by mouth every 8 (eight) hours as needed for pain.    . Alpha-Lipoic Acid 600 MG TABS Take 600 mg by mouth daily.     Marland Kitchen apixaban (ELIQUIS) 5 MG TABS tablet Take 1 tablet (5 mg total) by mouth 2 (two) times daily. 180 tablet 3  . Apple Cider Vinegar 500 MG TABS Take 500 mg by mouth 2 (two) times daily. Gummie    . atorvastatin (LIPITOR) 10 MG tablet Take 1 tablet (10 mg total) by mouth daily. 90 tablet 3  . buPROPion (WELLBUTRIN SR) 100 MG 12 hr tablet Take 1 tablet (100 mg total) by mouth daily. 90 tablet 1  . COLLAGEN PO Take 100 mg by mouth daily.     Marland Kitchen glucose blood test strip 1 each by Other route as needed. Use as instructed 100 each  3  . lisinopril-hydrochlorothiazide (ZESTORETIC) 20-25 MG tablet Take 1 tablet by mouth daily. 90 tablet 3  . Misc Natural Products (IMMUNE FORMULA PO) Take 2 tablets by mouth daily. immuneti    . Multiple Vitamins-Minerals (MULTIVITAMIN WITH MINERALS) tablet Take 1 tablet by mouth daily. Gummie 50+    . OneTouch Delica Lancets [id number] MISC 1 each by Does not apply route daily. 100 each 3  . oxybutynin (DITROPAN-XL) 10 MG 24 hr tablet Take 1 tablet (10 mg total) by mouth at bedtime. 90 tablet 3  . PARoxetine (PAXIL) 20 MG tablet Take 1 tablet (20 mg total) by mouth daily. 90  tablet 3  . sulfamethoxazole-trimethoprim (BACTRIM DS) 800-160 MG tablet Take 1 tablet by mouth 2 (two) times daily. 10 tablet 0   No current facility-administered medications for this visit.    Allergies:   Patient has no known allergies.   Social History:  The patient  reports that she has never smoked. She has never used smokeless tobacco. She reports current alcohol use of about 1.0 standard drinks of alcohol per week. She reports that she does not use drugs.   Family History:  The patient's family history includes Arthritis in her mother; Diabetes in her brother; Heart disease in her father; Neuropathy in her brother; Other in her brother.    ROS:  Please see the history of present illness.   Otherwise, review of systems is positive for none.   All other systems are reviewed and negative.   PHYSICAL EXAM: VS:  BP (!) 128/94   Pulse 67   Ht 5\' 11"  (1.803 m)   Wt 281 lb (127.5 kg)   LMP 01/26/2011   BMI 39.19 kg/m  , BMI Body mass index is 39.19 kg/m. GEN: Well nourished, well developed, in no acute distress  HEENT: normal  Neck: no JVD, carotid bruits, or masses Cardiac: RRR; no murmurs, rubs, or gallops,no edema  Respiratory:  clear to auscultation bilaterally, normal work of breathing GI: soft, nontender, nondistended, + BS MS: no deformity or atrophy  Skin: warm and dry Neuro:  Strength and sensation are intact Psych: euthymic mood, full affect  EKG:  EKG is ordered today. Personal review of the ekg ordered shows sinus rhythm, first-degree AV block, rate 67  Recent Labs: 04/14/2019: TSH 0.600 10/05/2019: ALT 20 10/11/2019: BUN 13; Creat 0.74; Hemoglobin 14.9; Platelets 275; Potassium 4.5; Sodium 139    Lipid Panel     Component Value Date/Time   CHOL 137 10/05/2019 0940   TRIG 57 10/05/2019 0940   HDL 52 10/05/2019 0940   CHOLHDL 2.6 10/05/2019 0940   CHOLHDL 2.5 01/21/2017 0921   VLDL 11 01/21/2017 0921   LDLCALC 73 10/05/2019 0940     Wt Readings from  Last 3 Encounters:  01/29/20 281 lb (127.5 kg)  01/15/20 278 lb 3.2 oz (126.2 kg)  11/22/19 272 lb (123.4 kg)      Other studies Reviewed: Additional studies/ records that were reviewed today include: TTE 06/07/19  Review of the above records today demonstrates:   1. The left ventricle has normal systolic function, with an ejection fraction of 55-60%. The cavity size was normal. Left ventricular diastolic Doppler parameters are indeterminate.  2. The right ventricle has normal systolic function. The cavity was normal. There is no increase in right ventricular wall thickness.  3. Left atrial size was mildly dilated.  4. No evidence of mitral valve stenosis.  5. The aortic valve has an indeterminate number of cusps. No  stenosis of the aortic valve.  6. The aorta is normal unless otherwise noted.  7. The aortic root is normal in size and structure.  8. Pulmonary hypertension is indeterminant, inadequate TR jet.  9. The interatrial septum was not well visualized.   ASSESSMENT AND PLAN:  1.  Persistent atrial fibrillation: Status post ablation 10/27/2019.  CHA2DS2-VASc of 3.  Currently on Eliquis.  Fortunately she remains in sinus rhythm after ablation.  We will continue with current management.  2.  Morbid obesity: diet and exercise encouraged  3.  Mobitz 1 AV block: Avoiding AV nodal blockers, AF rates controlled.  4.  Obstructive sleep apnea: CPAP compliance encouraged  5.  Hypertension: Currently well controlled  Current medicines are reviewed at length with the patient today.   The patient does not have concerns regarding her medicines.  The following changes were made today: None  Labs/ tests ordered today include:  Orders Placed This Encounter  Procedures  . EKG 12-Lead    Disposition:   FU with Will Camnitz three months  Signed, Will Meredith Leeds, MD  01/29/2020 9:44 AM     [facility] [address] [place] [address] [phone]  (office) [phone] (fax)

## 2020-01-29 NOTE — Patient Instructions (Signed)
Medication Instructions:  Your physician recommends that you continue on your current medications as directed. Please refer to the Current Medication list given to you today.  *If you need a refill on your cardiac medications before your next appointment, please call your pharmacy*   Lab Work: None ordered If you have labs (blood work) drawn today and your tests are completely normal, you will receive your results only by: . MyChart Message (if you have MyChart) OR . A paper copy in the mail If you have any lab test that is abnormal or we need to change your treatment, we will call you to review the results.   Testing/Procedures: None ordered   Follow-Up: At [facility], you and your health needs are our priority.  As part of our continuing mission to provide you with exceptional heart care, we have created designated Provider Care Teams.  These Care Teams include your primary Cardiologist (physician) and Advanced Practice Providers (APPs -  Physician Assistants and Nurse Practitioners) who all work together to provide you with the care you need, when you need it.  We recommend signing up for the patient portal called "MyChart".  Sign up information is provided on this After Visit Summary.  MyChart is used to connect with patients for Virtual Visits (Telemedicine).  Patients are able to view lab/test results, encounter notes, upcoming appointments, etc.  Non-urgent messages can be sent to your provider as well.   To learn more about what you can do with MyChart, go to [email or web address].    Your next appointment:   3 month(s)  The format for your next appointment:   In Person  Provider:   Will Camnitz, MD   Thank you for choosing [facility]!!   Girl Schissler, RN [phone]    Other Instructions    

## 2020-02-13 ENCOUNTER — Encounter: Payer: Self-pay | Admitting: Family Medicine

## 2020-02-13 ENCOUNTER — Ambulatory Visit: Payer: BC Managed Care – PPO | Admitting: Family Medicine

## 2020-02-13 ENCOUNTER — Other Ambulatory Visit: Payer: Self-pay

## 2020-02-13 ENCOUNTER — Ambulatory Visit
Admission: RE | Admit: 2020-02-13 | Discharge: 2020-02-13 | Disposition: A | Discharge status: Home / Self Care | Payer: BC Managed Care – PPO | Source: Ambulatory Visit | Attending: Family Medicine | Admitting: Family Medicine

## 2020-02-13 VITALS — BP 142/90 | HR 72 | Temp 98.0°F | Wt 284.8 lb

## 2020-02-13 DIAGNOSIS — G8929 Other chronic pain: Secondary | ICD-10-CM | POA: Diagnosis not present

## 2020-02-13 DIAGNOSIS — M545 Low back pain: Secondary | ICD-10-CM | POA: Diagnosis not present

## 2020-02-13 NOTE — Progress Notes (Signed)
   Subjective:    Patient ID: Laurie Mckinney, female    DOB: [birth year], 59 y.o.   MRN: [id number]  HPI She is here for evaluation of a long history of low back pain.  This goes back several decades.  She has not seen anybody in the past except a chiropractor.  She notes that any physical activity causes low back pain but no referred pain, numbness, tingling or weakness.  She usually takes care of this by backing off on any activity that causes trouble.  She apparently has been diagnosed with a neuropathy however I do not see any record about that.   Review of Systems     Objective:   Physical Exam Alert and in no distress.  No tenderness palpation over her lower lumbar area.  Normal lumbar curve and motion.  Straight leg raising is negative.  Knee reflexes normal.  Ankle is diminished.  Full motion of the hip without pain.  Normal strength and sensation.       Assessment & Plan:  Chronic midline low back pain without sciatica - Plan: DG Lumbar Spine Complete I discussed the long-term care of low back pain with mainly revolve around physical therapy.  She mentioned possible yoga and I definitely recommended that.  If the x-ray shows nothing more than arthritis I will refer to physical therapy.  She was comfortable with that.

## 2020-02-15 ENCOUNTER — Other Ambulatory Visit: Payer: Self-pay | Admitting: Family Medicine

## 2020-02-15 DIAGNOSIS — F341 Dysthymic disorder: Secondary | ICD-10-CM

## 2020-02-15 NOTE — Telephone Encounter (Signed)
[facility] is requesting to fill pt wellbutrin. Please advise KH 

## 2020-02-22 DIAGNOSIS — G4733 Obstructive sleep apnea (adult) (pediatric): Secondary | ICD-10-CM | POA: Diagnosis not present

## 2020-03-07 ENCOUNTER — Other Ambulatory Visit: Payer: Self-pay

## 2020-03-07 ENCOUNTER — Ambulatory Visit: Payer: BC Managed Care – PPO | Admitting: Adult Health

## 2020-03-07 ENCOUNTER — Encounter: Payer: Self-pay | Admitting: Adult Health

## 2020-03-07 VITALS — BP 157/85 | HR 67 | Ht 71.0 in | Wt 286.0 lb

## 2020-03-07 DIAGNOSIS — Z9989 Dependence on other enabling machines and devices: Secondary | ICD-10-CM

## 2020-03-07 DIAGNOSIS — G4739 Other sleep apnea: Secondary | ICD-10-CM

## 2020-03-07 NOTE — Progress Notes (Signed)
Order for cpap supplies sent to [place] via community msg. Confirmation received that the order transmitted was successful.

## 2020-03-07 NOTE — Patient Instructions (Addendum)
Your Plan:  Continue ongoing use of CPAP with excellent compliance and low residual apnea level  Continue to follow with DME company for any needed supplies for CPAP concerns     Follow up in 1 year or call earlier if needed        Thank you for coming to see [place] at [facility]. I hope we have been able to provide you high quality care today.  You may receive a patient satisfaction survey over the next few weeks. We would appreciate your feedback and comments so that we may continue to improve ourselves and the health of our patients.

## 2020-03-07 NOTE — Progress Notes (Signed)
PATIENT: Laurie Mckinney DOB: 11/25/1960  [facility] provider: Dr. Brett Fairy REASON FOR VISIT: CPAP compliance visit  HISTORY FROM: patient  Chief complaint: Chief Complaint  Patient presents with  . Follow-up    RM 9, alone. F/u for CPAP. Doing well on CPAP, no issues.     HPI  Today, 03/07/2020, Laurie Mckinney returns for CPAP compliance visit.  Review of compliance report from 02/06/2020 -03/06/2020 shows 28 out of 30 usage days with 27 days greater than 4 hours for 90% compliance.  Average usage 8 hours and 45 minutes with residual AHI 1.1.  Leaks in the 95th percentile 9.9 L/min.  Pressure in the 95th percentile 12.7 with min pressure 6 and max pressure of 16 and EPR level 3.  She continues to tolerate CPAP well and continues to experience benefit.  She has not been using modafinil as she retired at the end of 2020 and was previously using for shiftwork fatigue.  She does continue to take daily naps midafternoon if time permits. Continues to follow with cardiology for atrial fibrillation s/p successful A. fib ablation on 10/27/2019.  Continues to follow with DME company [employer] for needed supplies and currently up to date.  No concerns at this time.  ESS 10/24 FSS 44     History provided for reference purposes only Update 09/06/2019 JM: Laurie Mckinney is a 60 year old female who is being seen today via virtual visit for initial CPAP compliance visit.  She was initially seen by Dr. Brett Fairy on 06/06/2019 due to new diagnosis of atrial fibrillation and excessive daytime fatigue.  She underwent sleep study on 07/10/2019 which showed severe mixed sleep apnea and recommended initiation of CPAP.  Download report reviewed from 08/07/2019 -09/05/2019 which showed 30 out of 30 usage days and 29 days greater than 4 hours for 97% compliance.  Average usage 8 hours and 53 minutes with residual AHI 3.7.  Leaks in the 95th percentile 24.5 L/min.  Pressure in the 95th percentile 12.4 cm H2O with min pressure 6 cm H2O  and max pressure 16 cm H2O with EPR level 3.  She reports great benefit with use of CPAP with overall improvement of energy level and benefit regarding atrial fibrillation.  She has been able to improve tolerance with mask and is able to sleep comfortably.  She was also started on modafinil due to shift work fatigue and falling asleep while driving home from work in the morning.  She is prescribed modafinil 200 mg and will take half a pill prior to work and a half a pill prior to driving home.  This regimen has been working well for her and feels much more alert while driving.  She denies any side effects. Continues to follow up with cardiology regularly for atrial fibrillation. Continues on Eliquis 5mg  twice daily without bleeding or bruising.  No concerns at this time.    REVIEW OF SYSTEMS: Out of a complete 14 system review of symptoms, the patient complains only of the following symptoms, and all other reviewed systems are negative. Depression, anxiety, apnea  Epworth Sleepiness Scale How likely are you to doze in the following situations:  0 = not likely, 1 = slight chance, 2 = moderate chance, 3 = high chance   Sitting and Reading? 2 Watching Television? 2 Sitting inactive in a public place (theater or meeting)? 1 Lying down in the afternoon when circumstances permit? 3 Sitting and talking to someone? 0 Sitting quietly after lunch without alcohol? 1 In a car,  while stopped for a few minutes in traffic? 0 As a passenger in a car for an hour without a break? 1  Total = 10/24 (prior to initiating CPAP 21/24)       ALLERGIES: No Known Allergies  HOME MEDICATIONS: Outpatient Medications Prior to Visit  Medication Sig Dispense Refill  . acetaminophen (TYLENOL ARTHRITIS PAIN) 650 MG CR tablet Take 650 mg by mouth every 8 (eight) hours as needed for pain.    . Alpha-Lipoic Acid 600 MG TABS Take 600 mg by mouth daily.     Marland Kitchen apixaban (ELIQUIS) 5 MG TABS tablet Take 1 tablet (5 mg  total) by mouth 2 (two) times daily. 180 tablet 3  . Apple Cider Vinegar 500 MG TABS Take 500 mg by mouth 2 (two) times daily. Gummie    . atorvastatin (LIPITOR) 10 MG tablet Take 1 tablet (10 mg total) by mouth daily. 90 tablet 3  . buPROPion (WELLBUTRIN SR) 100 MG 12 hr tablet TAKE 1 TABLET DAILY 90 tablet 1  . COLLAGEN PO Take 100 mg by mouth daily.     Marland Kitchen glucose blood test strip 1 each by Other route as needed. Use as instructed 100 each 3  . lisinopril-hydrochlorothiazide (ZESTORETIC) 20-25 MG tablet Take 1 tablet by mouth daily. 90 tablet 3  . Misc Natural Products (IMMUNE FORMULA PO) Take 2 tablets by mouth daily. immuneti    . Multiple Vitamins-Minerals (MULTIVITAMIN WITH MINERALS) tablet Take 1 tablet by mouth daily. Gummie 50+    . OneTouch Delica Lancets 99991111 MISC 1 each by Does not apply route daily. 100 each 3  . oxybutynin (DITROPAN-XL) 10 MG 24 hr tablet Take 1 tablet (10 mg total) by mouth at bedtime. 90 tablet 3  . PARoxetine (PAXIL) 20 MG tablet Take 1 tablet (20 mg total) by mouth daily. 90 tablet 3  . sulfamethoxazole-trimethoprim (BACTRIM DS) 800-160 MG tablet Take 1 tablet by mouth 2 (two) times daily. (Patient not taking: Reported on 02/13/2020) 10 tablet 0   No facility-administered medications prior to visit.    PAST MEDICAL HISTORY: Past Medical History:  Diagnosis Date  . Adjustment insomnia    SHIFT WORK  . Allergy    RHINITIS  . Asthma   . Atrial flutter [facility]) 02/26/15   [facility] [place]  . Bursitis    right hip  . Diabetes mellitus without complication ([place])   . Hypertension   . Migraine headache   . Mobitz type 2 second degree heart block   . Obesity     PAST SURGICAL HISTORY: Past Surgical History:  Procedure Laterality Date  . ABDOMINAL HYSTERECTOMY    . ATRIAL FIBRILLATION ABLATION N/A 10/27/2019   Procedure: ATRIAL FIBRILLATION ABLATION;  Surgeon: Constance Haw, MD;  Location: [place] [facility];  Service: Cardiovascular;   Laterality: N/A;  . CARDIOVERSION N/A 05/25/2019   Procedure: CARDIOVERSION;  Surgeon: Arnoldo Lenis, MD;  Location: [facility];  Service: Endoscopy;  Laterality: N/A;  . COLONOSCOPY  2011   Dr.Brodie  . ROBOTIC ASSISTED TOTAL HYSTERECTOMY WITH BILATERAL SALPINGO OOPHERECTOMY Bilateral 02/05/2015   Procedure: ROBOTIC ASSISTED TOTAL LAPAROSCOPIC HYSTERECTOMY WITH BILATERAL SALPINGO OOPHORECTOMY;  Surgeon: Everitt Amber, MD;  Location: [facility];  Service: Gynecology;  Laterality: Bilateral;    FAMILY HISTORY: Family History  Problem Relation Age of Onset  . Arthritis Mother   . Heart disease Father   . Neuropathy Brother        both legs   . Diabetes Brother  no treatment that the pt knows of   . Other Brother        drank a 6 pack of beer daily x 30 years     SOCIAL HISTORY: Social History   Socioeconomic History  . Marital status: Married    Spouse name: Not on file  . Number of children: Not on file  . Years of education: Not on file  . Highest education level: Not on file  Occupational History  . Not on file  Tobacco Use  . Smoking status: Never Smoker  . Smokeless tobacco: Never Used  Substance and Sexual Activity  . Alcohol use: Yes    Alcohol/week: 1.0 standard drinks    Types: 1 Standard drinks or equivalent per week  . Drug use: No  . Sexual activity: Yes  Other Topics Concern  . Not on file  Social History Narrative   Lives at home with husband   Right handed   Caffeine: 1 cup of coffee in the morning and <20 oz of soda per day   Social Determinants of Health   Financial Resource Strain:   . Difficulty of Paying Living Expenses:   Food Insecurity:   . Worried About Charity fundraiser in the Last Year:   . Arboriculturist in the Last Year:   Transportation Needs:   . Film/video editor (Medical):   Marland Kitchen Lack of Transportation (Non-Medical):   Physical Activity:   . Days of Exercise per Week:   . Minutes of Exercise per Session:   Stress:   .  Feeling of Stress :   Social Connections:   . Frequency of Communication with Friends and Family:   . Frequency of Social Gatherings with Friends and Family:   . Attends Religious Services:   . Active Member of Clubs or Organizations:   . Attends Archivist Meetings:   Marland Kitchen Marital Status:   Intimate Partner Violence:   . Fear of Current or Ex-Partner:   . Emotionally Abused:   Marland Kitchen Physically Abused:   . Sexually Abused:       PHYSICAL EXAM Today's Vitals   03/07/20 0727  BP: (!) 157/85  Pulse: 67  Weight: 286 lb (129.7 kg)  Height: 5\' 11"  (1.803 m)   Body mass index is 39.89 kg/m.  General: well developed, well nourished,  pleasant middle-age Caucasian female, who seated, in no evident distress Head: head normocephalic and atraumatic.   Neck: supple with no carotid or supraclavicular bruits Cardiovascular: regular rate and rhythm, no murmurs Musculoskeletal: no deformity Skin:  no rash/petichiae Vascular:  Normal pulses all extremities   Neurologic Exam Mental Status: Awake and fully alert. Oriented to place and time. Recent and remote memory intact. Attention span, concentration and fund of knowledge appropriate. Mood and affect appropriate.  Cranial Nerves: Pupils equal, briskly reactive to light. Extraocular movements full without nystagmus. Visual fields full to confrontation. Hearing intact. Facial sensation intact. Face, tongue, palate moves normally and symmetrically.  Motor: Normal bulk and tone. Normal strength in all tested extremity muscles. Sensory.: intact to touch , pinprick , position and vibratory sensation.  Coordination: Rapid alternating movements normal in all extremities. Finger-to-nose and heel-to-shin performed accurately bilaterally. Gait and Station: Arises from chair without difficulty. Stance is normal. Gait demonstrates normal stride length and balance Reflexes: 1+ and symmetric. Toes downgoing.       DIAGNOSTIC DATA (LABS, IMAGING,  TESTING) - I reviewed patient records, labs, notes, testing and imaging myself where  available.  Nocturnal polysomnogram with seizure montage 07/10/2019 IMPRESSION:  1. Severe Mixed sleep apnea with mostly mixed type apneas, explaining the EDS symptoms. No MSLT was necessary. Apnea was influenced by supine sleep.  2. Mild Periodic Limb Movement Disorder (PLMD)  3. Primary Snoring.  4. Hypoxemia, intermittent - see screen shot Epoch 252.  5. Abnormal EKG(!)  6. Rather short sleep time- possibly related to shift work pattern/ circadian rhythm disorder.   RECOMMENDATIONS:Advise urgently CPAP therapy, either by autotitration or in a full-night, attended, CPAP titration study- to optimize therapy.  1. Fasted way will be an autotitration capable CPAP device, heated humidity and mask of choice, settings of 6-16 cm water, with 3 cm EPR.  2. Modafinil ordered for prn use at 200 mg as patient's sleepiness makes driving unsafe.         ASSESSMENT AND PLAN 59 y.o. year old female  has a past medical history of Adjustment insomnia, Allergy, Asthma, Atrial flutter ([place]) (02/26/15), Bursitis, Diabetes mellitus without complication ([place]), Hypertension, Migraine headache, Mobitz type 2 second degree heart block, and Obesity.  And diagnosis of severe mixed sleep apnea on 07/10/2019.  Excellent CPAP compliance with optimal residual AHI of 1.1.  Recommended continuation at current settings with no indication for changes at this time.  She no longer uses modafinil as she is retired but continues to take daily naps -advised to call office if she is interested in use of modafinil as needed when she is unable to take a nap during the day.  She will continue to follow with cardiology for atrial fibrillation monitoring and management.  She is interested in weight loss and discussed healthy weight and wellness clinic.  She states she will consider this and call office if interested in pursuing.   Follow-up in 1 year  or call earlier if needed If she decides to restart modafinil, she will need 45-month follow-up  I spent 20 minutes of face-to-face and non-face-to-face time with patient.  This included previsit chart review, lab review, study review, order entry, electronic health record documentation, patient education   Frann Rider, [facility]  [facility] [address] [place], [place] [address]  Phone [phone] Fax [phone] Note: This document was prepared with digital dictation and possible smart phrase technology. Any transcriptional errors that result from this process are unintentional.

## 2020-03-22 ENCOUNTER — Other Ambulatory Visit: Payer: Self-pay

## 2020-03-22 ENCOUNTER — Telehealth (INDEPENDENT_AMBULATORY_CARE_PROVIDER_SITE_OTHER): Payer: BC Managed Care – PPO | Admitting: Cardiology

## 2020-03-22 ENCOUNTER — Encounter: Payer: Self-pay | Admitting: Cardiology

## 2020-03-22 VITALS — BP 145/80 | HR 64 | Ht 71.0 in | Wt 286.0 lb

## 2020-03-22 DIAGNOSIS — I4891 Unspecified atrial fibrillation: Secondary | ICD-10-CM

## 2020-03-22 DIAGNOSIS — R0602 Shortness of breath: Secondary | ICD-10-CM | POA: Diagnosis not present

## 2020-03-22 NOTE — Progress Notes (Signed)
Virtual Visit via Telephone Note   This visit type was conducted due to national recommendations for restrictions regarding the COVID-19 Pandemic (e.g. social distancing) in an effort to limit this patient's exposure and mitigate transmission in our community.  Due to her co-morbid illnesses, this patient is at least at moderate risk for complications without adequate follow up.  This format is felt to be most appropriate for this patient at this time.  The patient did not have access to video technology/had technical difficulties with video requiring transitioning to audio format only (telephone).  All issues noted in this document were discussed and addressed.  No physical exam could be performed with this format.  Please refer to the patient's chart for her  consent to telehealth for [facility] Laurie Mckinney.   The patient was identified using 2 identifiers.  Date:  03/22/2020   ID:  Laurie Mckinney, DOB 05/02/1961, MRN [id number]  Patient Location: Home Provider Location: Home  PCP:  Denita Lung, MD  Cardiologist:  Carlyle Dolly, MD  Electrophysiologist:  Constance Haw, MD   Evaluation Performed:  Follow-Up Visit  Chief Complaint:  Follow up  History of Present Illness:    Laurie Mckinney is a 59 y.o. female seen today for follow up of the following medical problems.  1. AFib/Aflutter - prior issues with afib management, did not tolerate even rate controlled afib - had afib ablation Jan 2021 - no recent palpitations.  - compliant with meds, no bleeding eliquis   2. SOB/DOE 06/2019 echo LVEF 55-60%, mild LAE - symptoms have resolved since her afib ablation  3. Chest pain  01/2015 GXT no ischemic changes.  - no recent symptoms     4. OSA - on cpap  The patient does not have symptoms concerning for COVID-19 infection (fever, chills, cough, or new shortness of breath).    Past Medical History:  Diagnosis Date  . Adjustment insomnia    SHIFT WORK    . Allergy    RHINITIS  . Asthma   . Atrial flutter [facility]) 02/26/15   [facility] [place]  . Bursitis    right hip  . Diabetes mellitus without complication ([place])   . Hypertension   . Migraine headache   . Mobitz type 2 second degree heart block   . Obesity    Past Surgical History:  Procedure Laterality Date  . ABDOMINAL HYSTERECTOMY    . ATRIAL FIBRILLATION ABLATION N/A 10/27/2019   Procedure: ATRIAL FIBRILLATION ABLATION;  Surgeon: Constance Haw, MD;  Location: [place] [facility];  Service: Cardiovascular;  Laterality: N/A;  . CARDIOVERSION N/A 05/25/2019   Procedure: CARDIOVERSION;  Surgeon: Arnoldo Lenis, MD;  Location: [facility];  Service: Endoscopy;  Laterality: N/A;  . COLONOSCOPY  2011   Dr.Brodie  . ROBOTIC ASSISTED TOTAL HYSTERECTOMY WITH BILATERAL SALPINGO OOPHERECTOMY Bilateral 02/05/2015   Procedure: ROBOTIC ASSISTED TOTAL LAPAROSCOPIC HYSTERECTOMY WITH BILATERAL SALPINGO OOPHORECTOMY;  Surgeon: Everitt Amber, MD;  Location: [facility];  Service: Gynecology;  Laterality: Bilateral;     No outpatient medications have been marked as taking for the 03/22/20 encounter (Appointment) with Arnoldo Lenis, MD.     Allergies:   Patient has no known allergies.   Social History   Tobacco Use  . Smoking status: Never Smoker  . Smokeless tobacco: Never Used  Vaping Use  . Vaping Use: Never used  Substance Use Topics  . Alcohol use: Yes    Alcohol/week: 1.0 standard drink  Types: 1 Standard drinks or equivalent per week  . Drug use: No     Family Hx: The patient's family history includes Arthritis in her mother; Diabetes in her brother; Heart disease in her father; Neuropathy in her brother; Other in her brother.  ROS:   Please see the history of present illness.     All other systems reviewed and are negative.   Prior CV studies:   The following studies were reviewed today:  06/2019 echo IMPRESSIONS   1. The left ventricle has normal  systolic function, with an ejection fraction of 55-60%. The cavity size was normal. Left ventricular diastolic Doppler parameters are indeterminate. 2. The right ventricle has normal systolic function. The cavity was normal. There is no increase in right ventricular wall thickness. 3. Left atrial size was mildly dilated. 4. No evidence of mitral valve stenosis. 5. The aortic valve has an indeterminate number of cusps. No stenosis of the aortic valve. 6. The aorta is normal unless otherwise noted. 7. The aortic root is normal in size and structure. 8. Pulmonary hypertension is indeterminant, inadequate TR jet. 9. The interatrial septum was not well visualized.  Labs/Other Tests and Data Reviewed:    EKG:  No ECG reviewed.  Recent Labs: 04/14/2019: TSH 0.600 10/05/2019: ALT 20 10/11/2019: BUN 13; Creat 0.74; Hemoglobin 14.9; Platelets 275; Potassium 4.5; Sodium 139   Recent Lipid Panel Lab Results  Component Value Date/Time   CHOL 137 10/05/2019 09:40 AM   TRIG 57 10/05/2019 09:40 AM   HDL 52 10/05/2019 09:40 AM   CHOLHDL 2.6 10/05/2019 09:40 AM   CHOLHDL 2.5 01/21/2017 09:21 AM   LDLCALC 73 10/05/2019 09:40 AM    Wt Readings from Last 3 Encounters:  03/07/20 286 lb (129.7 kg)  02/13/20 284 lb 12.8 oz (129.2 kg)  01/29/20 281 lb (127.5 kg)     Objective:    Vital Signs:   Today's Vitals   03/22/20 0801  BP: (!) 145/80  Pulse: 64  SpO2: 97%  Weight: 286 lb (129.7 kg)  Height: 5\' 11"  (1.803 m)   Body mass index is 39.89 kg/m. Normal affect. Normal speech pattern and tone. Comfortable,no apparent distress. No audible signs of sob or wheezing.   ASSESSMENT & PLAN:     1. Afib - s/p ablation, continues to feel well without any symptoms - continue anticoag - continue to follow in EP clinic  2 shortness of breath/DOE - resolved since afib ablation, prior echo was benign - continue to monitor    COVID-19 Education: The signs and symptoms of COVID-19 were  discussed with the patient and how to seek care for testing (follow up with PCP or arrange E-visit).  The importance of social distancing was discussed today.  Time:   Today, I have spent 17 minutes with the patient with telehealth technology discussing the above problems.     Medication Adjustments/Labs and Tests Ordered: Current medicines are reviewed at length with the patient today.  Concerns regarding medicines are outlined above.   Tests Ordered: No orders of the defined types were placed in this encounter.   Medication Changes: No orders of the defined types were placed in this encounter.   Follow Up:  In Person in 1 year(s)  Signed, Carlyle Dolly, MD  03/22/2020 7:36 AM    [place]

## 2020-03-22 NOTE — Patient Instructions (Signed)

## 2020-03-24 DIAGNOSIS — G4733 Obstructive sleep apnea (adult) (pediatric): Secondary | ICD-10-CM | POA: Diagnosis not present

## 2020-04-23 DIAGNOSIS — G4733 Obstructive sleep apnea (adult) (pediatric): Secondary | ICD-10-CM | POA: Diagnosis not present

## 2020-05-02 ENCOUNTER — Ambulatory Visit: Payer: BC Managed Care – PPO | Admitting: Cardiology

## 2020-07-20 ENCOUNTER — Other Ambulatory Visit: Payer: Self-pay | Admitting: Family Medicine

## 2020-07-23 ENCOUNTER — Other Ambulatory Visit: Payer: Self-pay

## 2020-07-23 ENCOUNTER — Encounter: Payer: Self-pay | Admitting: Cardiology

## 2020-07-23 ENCOUNTER — Ambulatory Visit: Payer: BC Managed Care – PPO | Admitting: Cardiology

## 2020-07-23 VITALS — BP 136/80 | HR 75 | Ht 72.0 in | Wt 272.0 lb

## 2020-07-23 DIAGNOSIS — I4819 Other persistent atrial fibrillation: Secondary | ICD-10-CM

## 2020-07-23 NOTE — Patient Instructions (Signed)
Medication Instructions:  Your physician recommends that you continue on your current medications as directed. Please refer to the Current Medication list given to you today.  *If you need a refill on your cardiac medications before your next appointment, please call your pharmacy*   Lab Work: None ordered  Testing/Procedures: None ordered   Follow-Up: At [facility], you and your health needs are our priority.  As part of our continuing mission to provide you with exceptional heart care, we have created designated Provider Care Teams.  These Care Teams include your primary Cardiologist (physician) and Advanced Practice Providers (APPs -  Physician Assistants and Nurse Practitioners) who all work together to provide you with the care you need, when you need it.  Your next appointment:   6 month(s)  The format for your next appointment:   In Person  Provider:   [name], MD   You have been referred to Medical Weight Management   Thank you for choosing [place]!!   [name], RN [phone]   Other Instructions

## 2020-07-23 NOTE — Progress Notes (Signed)
Electrophysiology Office Note   Date:  07/23/2020   ID:  Laurie, Mckinney September 08, 1961, MRN [id number]  PCP:  Denita Lung, MD  Cardiologist:  Harl Bowie Primary Electrophysiologist: Gaye Alken, MD    Chief Complaint: AF   History of Present Illness: Laurie Mckinney is a 59 y.o. female who is being seen today for the evaluation of AF at the request of Cristopher Peru. Presenting today for electrophysiology evaluation.  She has a history of atrial fibrillation, atrial flutter, hypertension, Mobitz 1 AV block, obstructive sleep apnea on CPAP.  She had a cardioversion but unfortunately went back into atrial fibrillation.  She is now status post ablation 10/27/2019.  Today, denies symptoms of palpitations, chest pain, shortness of breath, orthopnea, PND, lower extremity edema, claudication, dizziness, presyncope, syncope, bleeding, or neurologic sequela. The patient is tolerating medications without difficulties.  Since last being seen she has done well.  She has no chest pain or shortness of breath.  She is remained in sinus rhythm without palpitations.  She continues to eat a low-carb diet and exercise.  She has lost up to 12 pounds in the last 3 months.   Past Medical History:  Diagnosis Date  . Adjustment insomnia    SHIFT WORK  . Allergy    RHINITIS  . Asthma   . Atrial flutter [facility]) 02/26/15   [facility] [place]  . Bursitis    right hip  . Diabetes mellitus without complication ([place])   . Hypertension   . Migraine headache   . Mobitz type 2 second degree heart block   . Obesity    Past Surgical History:  Procedure Laterality Date  . ABDOMINAL HYSTERECTOMY    . ATRIAL FIBRILLATION ABLATION N/A 10/27/2019   Procedure: ATRIAL FIBRILLATION ABLATION;  Surgeon: Constance Haw, MD;  Location: [place] [facility];  Service: Cardiovascular;  Laterality: N/A;  . CARDIOVERSION N/A 05/25/2019   Procedure: CARDIOVERSION;  Surgeon: Arnoldo Lenis,  MD;  Location: [facility];  Service: Endoscopy;  Laterality: N/A;  . COLONOSCOPY  2011   Dr.Brodie  . ROBOTIC ASSISTED TOTAL HYSTERECTOMY WITH BILATERAL SALPINGO OOPHERECTOMY Bilateral 02/05/2015   Procedure: ROBOTIC ASSISTED TOTAL LAPAROSCOPIC HYSTERECTOMY WITH BILATERAL SALPINGO OOPHORECTOMY;  Surgeon: Everitt Amber, MD;  Location: [facility];  Service: Gynecology;  Laterality: Bilateral;     Current Outpatient Medications  Medication Sig Dispense Refill  . acetaminophen (TYLENOL ARTHRITIS PAIN) 650 MG CR tablet Take 650 mg by mouth every 8 (eight) hours as needed for pain.    . Alpha-Lipoic Acid 600 MG TABS Take 600 mg by mouth daily.     Marland Kitchen apixaban (ELIQUIS) 5 MG TABS tablet Take 1 tablet (5 mg total) by mouth 2 (two) times daily. 180 tablet 3  . atorvastatin (LIPITOR) 10 MG tablet TAKE 1 TABLET DAILY 90 tablet 3  . buPROPion (WELLBUTRIN SR) 100 MG 12 hr tablet TAKE 1 TABLET DAILY 90 tablet 1  . COLLAGEN PO Take 100 mg by mouth daily.     Marland Kitchen glucose blood test strip 1 each by Other route as needed. Use as instructed 100 each 3  . lisinopril-hydrochlorothiazide (ZESTORETIC) 20-25 MG tablet Take 1 tablet by mouth daily. 90 tablet 3  . Misc Natural Products (IMMUNE FORMULA PO) Take 2 tablets by mouth daily. immuneti    . Multiple Vitamins-Minerals (MULTIVITAMIN WITH MINERALS) tablet Take 1 tablet by mouth daily. Gummie 50+    . OneTouch Delica Lancets 76L MISC 1 each by Does  not apply route daily. 100 each 3  . oxybutynin (DITROPAN-XL) 10 MG 24 hr tablet Take 1 tablet (10 mg total) by mouth at bedtime. 90 tablet 3  . PARoxetine (PAXIL) 20 MG tablet Take 1 tablet (20 mg total) by mouth daily. 90 tablet 3   No current facility-administered medications for this visit.    Allergies:   Patient has no known allergies.   Social History:  The patient  reports that she has never smoked. She has never used smokeless tobacco. She reports current alcohol use of about 1.0 standard drink of alcohol per week. She  reports that she does not use drugs.   Family History:  The patient's family history includes Arthritis in her mother; Diabetes in her brother; Heart disease in her father; Neuropathy in her brother; Other in her brother.   ROS:  Please see the history of present illness.   Otherwise, review of systems is positive for none.   All other systems are reviewed and negative.   PHYSICAL EXAM: VS:  BP 136/80   Pulse 75   Ht 6' (1.829 m)   Wt 272 lb (123.4 kg)   LMP 01/26/2011   SpO2 97%   BMI 36.89 kg/m  , BMI Body mass index is 36.89 kg/m. GEN: Well nourished, well developed, in no acute distress  HEENT: normal  Neck: no JVD, carotid bruits, or masses Cardiac: RRR; no murmurs, rubs, or gallops,no edema  Respiratory:  clear to auscultation bilaterally, normal work of breathing GI: soft, nontender, nondistended, + BS MS: no deformity or atrophy  Skin: warm and dry Neuro:  Strength and sensation are intact Psych: euthymic mood, full affect  EKG:  EKG is ordered today. Personal review of the ekg ordered shows sinus rhythm, rate 61, first-degree AV block  Recent Labs: 10/05/2019: ALT 20 10/11/2019: BUN 13; Creat 0.74; Hemoglobin 14.9; Platelets 275; Potassium 4.5; Sodium 139    Lipid Panel     Component Value Date/Time   CHOL 137 10/05/2019 0940   TRIG 57 10/05/2019 0940   HDL 52 10/05/2019 0940   CHOLHDL 2.6 10/05/2019 0940   CHOLHDL 2.5 01/21/2017 0921   VLDL 11 01/21/2017 0921   LDLCALC 73 10/05/2019 0940     Wt Readings from Last 3 Encounters:  07/23/20 272 lb (123.4 kg)  03/22/20 286 lb (129.7 kg)  03/07/20 286 lb (129.7 kg)      Other studies Reviewed: Additional studies/ records that were reviewed today include: TTE 06/07/19  Review of the above records today demonstrates:   1. The left ventricle has normal systolic function, with an ejection fraction of 55-60%. The cavity size was normal. Left ventricular diastolic Doppler parameters are indeterminate.  2. The  right ventricle has normal systolic function. The cavity was normal. There is no increase in right ventricular wall thickness.  3. Left atrial size was mildly dilated.  4. No evidence of mitral valve stenosis.  5. The aortic valve has an indeterminate number of cusps. No stenosis of the aortic valve.  6. The aorta is normal unless otherwise noted.  7. The aortic root is normal in size and structure.  8. Pulmonary hypertension is indeterminant, inadequate TR jet.  9. The interatrial septum was not well visualized.   ASSESSMENT AND PLAN:  1.  Persistent atrial fibrillation: Status post ablation 10/27/2019.  CHA2DS2-VASc of 3.  Currently on Eliquis.  She remains in sinus rhythm.  We Vonna Brabson continue with current management.  2.  Morbid obesity: Diet and exercise  encouraged.  She has lost up to 12 pounds in the last 3 months.  We Joelle Roswell refer her to the total weight loss clinic as she is interested in more options.  3.  Mobitz 1 AV block: Avoid AV nodal blockers.  AF rates have been controlled.    4.  Obstructive sleep apnea: CPAP compliance encouraged  5.  Hypertension: Currently well controlled  Current medicines are reviewed at length with the patient today.   The patient does not have concerns regarding her medicines.  The following changes were made today: None  Labs/ tests ordered today include:  Orders Placed This Encounter  Procedures  . Amb Ref to Medical Weight Management  . EKG 12-Lead    Disposition:   FU with Rutilio Yellowhair 6 months  Signed, Abriel Geesey Meredith Leeds, MD  07/23/2020 8:52 AM     [facility] [address] [place] [address] [phone] (office) [phone] (fax)

## 2020-08-03 ENCOUNTER — Other Ambulatory Visit: Payer: Self-pay | Admitting: Family Medicine

## 2020-08-03 DIAGNOSIS — F341 Dysthymic disorder: Secondary | ICD-10-CM

## 2020-08-05 ENCOUNTER — Encounter: Payer: Self-pay | Admitting: Family Medicine

## 2020-08-05 ENCOUNTER — Telehealth: Payer: Self-pay | Admitting: *Deleted

## 2020-08-05 NOTE — Telephone Encounter (Signed)
Returned the patient's call and scheduled her for a follow up on 11/17 with Dr Denman George

## 2020-08-05 NOTE — Telephone Encounter (Signed)
[facility] is requesting to fill pt paxil. Please advise [facility]

## 2020-08-06 ENCOUNTER — Telehealth: Payer: Self-pay

## 2020-08-06 DIAGNOSIS — F341 Dysthymic disorder: Secondary | ICD-10-CM

## 2020-08-06 MED ORDER — ATORVASTATIN CALCIUM 10 MG PO TABS: 10.0000 mg | ORAL_TABLET | Freq: Every day | ORAL | 1 refills | Status: DC

## 2020-08-06 MED ORDER — BUPROPION HCL ER (SR) 100 MG PO TB12: 100.0000 mg | ORAL_TABLET | Freq: Every day | ORAL | 0 refills | Status: DC

## 2020-08-06 NOTE — Telephone Encounter (Signed)
Pt would like wellbutrin fill at [facility] . Please advise kh

## 2020-08-06 NOTE — Addendum Note (Signed)
Addended by: Denita Lung on: 08/06/2020 10:37 AM   Modules accepted: Orders

## 2020-08-08 ENCOUNTER — Telehealth: Payer: Self-pay

## 2020-08-08 NOTE — Telephone Encounter (Signed)
Pt need a med check appointment. [facility] [place]

## 2020-08-12 DIAGNOSIS — H903 Sensorineural hearing loss, bilateral: Secondary | ICD-10-CM | POA: Diagnosis not present

## 2020-08-19 ENCOUNTER — Encounter: Payer: Self-pay | Admitting: Gynecologic Oncology

## 2020-08-19 DIAGNOSIS — C541 Malignant neoplasm of endometrium: Secondary | ICD-10-CM | POA: Insufficient documentation

## 2020-08-19 DIAGNOSIS — Z90710 Acquired absence of both cervix and uterus: Secondary | ICD-10-CM | POA: Insufficient documentation

## 2020-08-20 ENCOUNTER — Encounter: Payer: Self-pay | Admitting: Gynecologic Oncology

## 2020-08-21 ENCOUNTER — Other Ambulatory Visit: Payer: Self-pay

## 2020-08-21 ENCOUNTER — Inpatient Hospital Stay: Payer: BC Managed Care – PPO | Attending: Gynecologic Oncology | Admitting: Gynecologic Oncology

## 2020-08-21 ENCOUNTER — Encounter: Payer: Self-pay | Admitting: Gynecologic Oncology

## 2020-08-21 VITALS — BP 144/87 | HR 90 | Temp 96.0°F | Resp 20 | Ht 72.0 in | Wt 270.1 lb

## 2020-08-21 DIAGNOSIS — Z79899 Other long term (current) drug therapy: Secondary | ICD-10-CM | POA: Insufficient documentation

## 2020-08-21 DIAGNOSIS — Z90710 Acquired absence of both cervix and uterus: Secondary | ICD-10-CM

## 2020-08-21 DIAGNOSIS — I4891 Unspecified atrial fibrillation: Secondary | ICD-10-CM | POA: Diagnosis not present

## 2020-08-21 DIAGNOSIS — Z90722 Acquired absence of ovaries, bilateral: Secondary | ICD-10-CM | POA: Insufficient documentation

## 2020-08-21 DIAGNOSIS — I441 Atrioventricular block, second degree: Secondary | ICD-10-CM | POA: Insufficient documentation

## 2020-08-21 DIAGNOSIS — C541 Malignant neoplasm of endometrium: Secondary | ICD-10-CM | POA: Diagnosis not present

## 2020-08-21 DIAGNOSIS — E669 Obesity, unspecified: Secondary | ICD-10-CM | POA: Diagnosis not present

## 2020-08-21 DIAGNOSIS — G4733 Obstructive sleep apnea (adult) (pediatric): Secondary | ICD-10-CM | POA: Diagnosis not present

## 2020-08-21 DIAGNOSIS — E119 Type 2 diabetes mellitus without complications: Secondary | ICD-10-CM | POA: Diagnosis not present

## 2020-08-21 DIAGNOSIS — I10 Essential (primary) hypertension: Secondary | ICD-10-CM | POA: Insufficient documentation

## 2020-08-21 DIAGNOSIS — Z7901 Long term (current) use of anticoagulants: Secondary | ICD-10-CM | POA: Insufficient documentation

## 2020-08-21 NOTE — Patient Instructions (Signed)
Dr Denman George is discharging you from regular cancer follow-up as you have reached 5 years with no evidence of the cancer returning.  Please contact her office if you ever have any questions or concerning symptoms. [place]

## 2020-08-21 NOTE — Progress Notes (Signed)
FOLLOWUP VISIT ENDOMETRIAL CANCER Chief Complaint  Patient presents with  . Endometrial cancer [facility])     Assessment:    59 y.o. year old with history of Stage IA Grade 1 endometrioid endometrial cancer.   S/p robotic hysterectomy, BSO on 02/05/15. Low risk features on pathology, therefore no adjuvant therapy recommended. No evidence for disease recurrence on today's exam.  Plan: She is being discharged from routine cancer surveillance as it has been >5 years with no evidence of recurrence.  HPI:  Laurie Mckinney is a 59 y.o. year old initially seen in consultation on 01/07/15 for complex atypical hyperplasia, referred by Dr Ulanda Edison.  She then underwent a robotic hysterectomy, BSO on 7/0/35 without complications.  Her postoperative course was uncomplicated. An occult endometrial cancer was identified on final pathology.  Her final pathologic diagnosis is a Stage IA Grade 1 endometrioid endometrial cancer with no lymphovascular space invasion, 2/22 mm (10%) of myometrial invasion. Lymph nodes were not sampled.  She had bleeding from granulation tissue at the vaginal cuff removed in December 2016.  INTERVAL HX: She reported episodes of feeling weak and dizzy in July 2020.  She felt almost as though she were drunk.  She was evaluated with CT scan of the head and MRI of the head both of which were negative for evidence of either stroke or metastatic disease.  These were performed on April 19, 2019.  She was subsequently diagnosed with atrial fibrillation and obstructive sleep apnea which was thought to be contributing to her symptoms.  She denies symptoms concerning for recurrence.   No Known Allergies Current Outpatient Medications on File Prior to Visit  Medication Sig Dispense Refill  . acetaminophen (TYLENOL ARTHRITIS PAIN) 650 MG CR tablet Take 650 mg by mouth every 8 (eight) hours as needed for pain.    . Alpha-Lipoic Acid 600 MG TABS Take 600 mg by mouth daily.     Marland Kitchen apixaban (ELIQUIS) 5 MG  TABS tablet Take 1 tablet (5 mg total) by mouth 2 (two) times daily. 180 tablet 3  . atorvastatin (LIPITOR) 10 MG tablet Take 1 tablet (10 mg total) by mouth daily. 90 tablet 1  . buPROPion (WELLBUTRIN SR) 100 MG 12 hr tablet Take 1 tablet (100 mg total) by mouth daily. 90 tablet 0  . COLLAGEN PO Take 100 mg by mouth daily.     Marland Kitchen glucose blood test strip 1 each by Other route as needed. Use as instructed 100 each 3  . lisinopril-hydrochlorothiazide (ZESTORETIC) 20-25 MG tablet Take 1 tablet by mouth daily. 90 tablet 3  . Misc Natural Products (IMMUNE FORMULA PO) Take 2 tablets by mouth daily. immuneti    . Multiple Vitamins-Minerals (MULTIVITAMIN WITH MINERALS) tablet Take 1 tablet by mouth daily. Gummie 50+    . OneTouch Delica Lancets 00X MISC 1 each by Does not apply route daily. 100 each 3  . oxybutynin (DITROPAN-XL) 10 MG 24 hr tablet Take 1 tablet (10 mg total) by mouth at bedtime. 90 tablet 3  . PARoxetine (PAXIL) 20 MG tablet TAKE 1 TABLET DAILY 90 tablet 0   No current facility-administered medications on file prior to visit.   Past Medical History:  Diagnosis Date  . Adjustment insomnia    SHIFT WORK  . Allergy    RHINITIS  . Asthma   . Atrial flutter [facility]) 02/26/15   [facility] [place]  . Bursitis    right hip  . Diabetes mellitus without complication ([place])   . Hypertension   .  Migraine headache   . Mobitz type 2 second degree heart block   . Obesity    Past Surgical History:  Procedure Laterality Date  . ABDOMINAL HYSTERECTOMY    . ATRIAL FIBRILLATION ABLATION N/A 10/27/2019   Procedure: ATRIAL FIBRILLATION ABLATION;  Surgeon: Constance Haw, MD;  Location: [place] [facility];  Service: Cardiovascular;  Laterality: N/A;  . CARDIOVERSION N/A 05/25/2019   Procedure: CARDIOVERSION;  Surgeon: Arnoldo Lenis, MD;  Location: [facility];  Service: Endoscopy;  Laterality: N/A;  . COLONOSCOPY  2011   Dr.Brodie  . ROBOTIC ASSISTED TOTAL HYSTERECTOMY WITH  BILATERAL SALPINGO OOPHERECTOMY Bilateral 02/05/2015   Procedure: ROBOTIC ASSISTED TOTAL LAPAROSCOPIC HYSTERECTOMY WITH BILATERAL SALPINGO OOPHORECTOMY;  Surgeon: Everitt Amber, MD;  Location: [facility];  Service: Gynecology;  Laterality: Bilateral;   Family History  Problem Relation Age of Onset  . Arthritis Mother   . Heart disease Father   . Neuropathy Brother        both legs   . Diabetes Brother        no treatment that the pt knows of   . Other Brother        drank a 6 pack of beer daily x 30 years    Social History   Socioeconomic History  . Marital status: Married    Spouse name: Not on file  . Number of children: Not on file  . Years of education: Not on file  . Highest education level: Not on file  Occupational History  . Not on file  Tobacco Use  . Smoking status: Never Smoker  . Smokeless tobacco: Never Used  Vaping Use  . Vaping Use: Never used  Substance and Sexual Activity  . Alcohol use: Yes    Alcohol/week: 1.0 standard drink    Types: 1 Standard drinks or equivalent per week  . Drug use: No  . Sexual activity: Yes  Other Topics Concern  . Not on file  Social History Narrative   Lives at home with husband   Right handed   Caffeine: 1 cup of coffee in the morning and <20 oz of soda per day   Social Determinants of Health   Financial Resource Strain:   . Difficulty of Paying Living Expenses: Not on file  Food Insecurity:   . Worried About Charity fundraiser in the Last Year: Not on file  . Ran Out of Food in the Last Year: Not on file  Transportation Needs:   . Lack of Transportation (Medical): Not on file  . Lack of Transportation (Non-Medical): Not on file  Physical Activity:   . Days of Exercise per Week: Not on file  . Minutes of Exercise per Session: Not on file  Stress:   . Feeling of Stress : Not on file  Social Connections:   . Frequency of Communication with Friends and Family: Not on file  . Frequency of Social Gatherings with Friends and  Family: Not on file  . Attends Religious Services: Not on file  . Active Member of Clubs or Organizations: Not on file  . Attends Archivist Meetings: Not on file  . Marital Status: Not on file  Intimate Partner Violence:   . Fear of Current or Ex-Partner: Not on file  . Emotionally Abused: Not on file  . Physically Abused: Not on file  . Sexually Abused: Not on file     Review of systems: Constitutional:  She has no weight gain or weight loss. She  has no fever or chills. Eyes: No blurred vision Ears, Nose, Mouth, Throat: No dizziness, headaches or changes in hearing. No mouth sores. Cardiovascular: No chest pain, palpitations or edema. Respiratory:  No shortness of breath, wheezing or cough Gastrointestinal: She has normal bowel movements without diarrhea or constipation. She denies any nausea or vomiting. She denies blood in her stool or heart burn. Genitourinary:  She denies pelvic pain, pelvic pressure. She has no hematuria, dysuria, or incontinence. She has no irregular vaginal bleeding or vaginal discharge. + urinary incontinence. Musculoskeletal: Denies muscle weakness or joint pains.  Skin:  She has no skin changes, rashes or itching Neurological:  Denies dizziness or headaches. No neuropathy, no numbness or tingling. Psychiatric:  She denies depression or anxiety. Hematologic/Lymphatic:   No easy bruising or bleeding   Physical Exam: Blood pressure (!) 144/87, pulse 90, temperature (!) 96 F (35.6 C), temperature source Tympanic, resp. rate 20, height 6' (1.829 m), weight 270 lb 2 oz (122.5 kg), last menstrual period 01/26/2011, SpO2 98 %. General: Well dressed, well nourished in no apparent distress.   HEENT:  Normocephalic and atraumatic, no lesions.  Extraocular muscles intact. Sclerae anicteric. Pupils equal, round, reactive. No mouth sores or ulcers. Thyroid is normal size, not nodular, midline. Skin:  No lesions or rashes. Breasts:  deferred Lungs:  deferred Cardiovascular:  deferred Abdomen:  Soft, nontender, nondistended.  No palpable masses.  No hepatosplenomegaly.  No ascites. Normal bowel sounds.  No hernias.  Incisions are well healed Genitourinary: Normal EGBUS  Vaginal cuff intact.  No bleeding or discharge.  No cul de sac fullness. Normal cuff. Extremities: No cyanosis, clubbing or edema.  No calf tenderness or erythema. No palpable cords. Psychiatric: Mood and affect are appropriate. Neurological: Awake, alert and oriented x 3. Sensation is intact, no neuropathy.  Musculoskeletal: No pain, normal strength and range of motion.  Thereasa Solo, MD

## 2020-09-10 ENCOUNTER — Other Ambulatory Visit: Payer: Self-pay | Admitting: Family Medicine

## 2020-09-12 ENCOUNTER — Telehealth: Payer: Self-pay

## 2020-09-12 DIAGNOSIS — G4733 Obstructive sleep apnea (adult) (pediatric): Secondary | ICD-10-CM | POA: Diagnosis not present

## 2020-09-12 NOTE — Telephone Encounter (Signed)
Recv'd letter from [place] that as of 10/05/20 Eliquis will no longer be covered, can pt be switched to Warfarin or Xarelto?

## 2020-09-13 NOTE — Telephone Encounter (Signed)
Xarelto

## 2020-09-17 NOTE — Telephone Encounter (Signed)
20mg

## 2020-09-17 NOTE — Telephone Encounter (Signed)
Do you want pt on Xarelto 10 or 20 mg ?

## 2020-09-18 MED ORDER — RIVAROXABAN 20 MG PO TABS: 20.0000 mg | ORAL_TABLET | Freq: Every day | ORAL | 3 refills | Status: DC

## 2020-09-18 NOTE — Telephone Encounter (Signed)
Sent in Xarelto to pharmacy, called & left patient message

## 2020-09-26 ENCOUNTER — Telehealth: Payer: Self-pay

## 2020-09-26 NOTE — Telephone Encounter (Signed)
error 

## 2020-10-07 ENCOUNTER — Other Ambulatory Visit: Payer: Self-pay | Admitting: Family Medicine

## 2020-10-07 DIAGNOSIS — E1159 Type 2 diabetes mellitus with other circulatory complications: Secondary | ICD-10-CM

## 2020-10-14 ENCOUNTER — Telehealth: Payer: Self-pay

## 2020-10-14 ENCOUNTER — Other Ambulatory Visit: Payer: Self-pay

## 2020-10-14 DIAGNOSIS — R32 Unspecified urinary incontinence: Secondary | ICD-10-CM

## 2020-10-14 MED ORDER — OXYBUTYNIN CHLORIDE ER 10 MG PO TB24: 10.0000 mg | ORAL_TABLET | Freq: Every day | ORAL | 1 refills | Status: DC

## 2020-10-14 NOTE — Telephone Encounter (Signed)
Done KH 

## 2020-10-14 NOTE — Telephone Encounter (Signed)
Received fax from [facility] for a refill on the pts. Ditropan pt. Last apt was 02/13/20 and next apt. 02/10/21.

## 2020-10-17 ENCOUNTER — Other Ambulatory Visit: Payer: Self-pay | Admitting: Family Medicine

## 2020-10-17 DIAGNOSIS — F341 Dysthymic disorder: Secondary | ICD-10-CM

## 2020-10-18 NOTE — Telephone Encounter (Signed)
[facility] Is [facility] to [facility] pt Paxil. Please advise [facility]

## 2020-11-01 ENCOUNTER — Encounter (INDEPENDENT_AMBULATORY_CARE_PROVIDER_SITE_OTHER): Payer: Self-pay

## 2020-11-02 ENCOUNTER — Other Ambulatory Visit: Payer: Self-pay | Admitting: Family Medicine

## 2020-11-04 ENCOUNTER — Other Ambulatory Visit: Payer: Self-pay | Admitting: Family Medicine

## 2020-11-14 ENCOUNTER — Encounter (INDEPENDENT_AMBULATORY_CARE_PROVIDER_SITE_OTHER): Payer: Self-pay | Admitting: Family Medicine

## 2020-11-14 ENCOUNTER — Other Ambulatory Visit: Payer: Self-pay

## 2020-11-14 ENCOUNTER — Ambulatory Visit (INDEPENDENT_AMBULATORY_CARE_PROVIDER_SITE_OTHER): Payer: BC Managed Care – PPO | Admitting: Family Medicine

## 2020-11-14 VITALS — BP 143/85 | HR 62 | Temp 97.7°F | Ht 71.0 in | Wt 268.0 lb

## 2020-11-14 DIAGNOSIS — E1159 Type 2 diabetes mellitus with other circulatory complications: Secondary | ICD-10-CM

## 2020-11-14 DIAGNOSIS — Z9189 Other specified personal risk factors, not elsewhere classified: Secondary | ICD-10-CM

## 2020-11-14 DIAGNOSIS — R5383 Other fatigue: Secondary | ICD-10-CM | POA: Insufficient documentation

## 2020-11-14 DIAGNOSIS — E785 Hyperlipidemia, unspecified: Secondary | ICD-10-CM

## 2020-11-14 DIAGNOSIS — Z1331 Encounter for screening for depression: Secondary | ICD-10-CM

## 2020-11-14 DIAGNOSIS — I152 Hypertension secondary to endocrine disorders: Secondary | ICD-10-CM

## 2020-11-14 DIAGNOSIS — E1169 Type 2 diabetes mellitus with other specified complication: Secondary | ICD-10-CM

## 2020-11-14 DIAGNOSIS — Z6837 Body mass index (BMI) 37.0-37.9, adult: Secondary | ICD-10-CM

## 2020-11-14 DIAGNOSIS — Z0289 Encounter for other administrative examinations: Secondary | ICD-10-CM

## 2020-11-14 DIAGNOSIS — R0602 Shortness of breath: Secondary | ICD-10-CM | POA: Diagnosis not present

## 2020-11-14 DIAGNOSIS — E559 Vitamin D deficiency, unspecified: Secondary | ICD-10-CM

## 2020-11-15 ENCOUNTER — Encounter: Payer: BC Managed Care – PPO | Admitting: Family Medicine

## 2020-11-15 LAB — CBC WITH DIFFERENTIAL/PLATELET
Basophils Absolute: 0 10*3/uL (ref 0.0–0.2)
Basos: 0 %
EOS (ABSOLUTE): 0.1 10*3/uL (ref 0.0–0.4)
Eos: 1 %
Hematocrit: 43.2 % (ref 34.0–46.6)
Hemoglobin: 14.5 g/dL (ref 11.1–15.9)
Immature Grans (Abs): 0 10*3/uL (ref 0.0–0.1)
Immature Granulocytes: 0 %
Lymphocytes Absolute: 2 10*3/uL (ref 0.7–3.1)
Lymphs: 26 %
MCH: 30.7 pg (ref 26.6–33.0)
MCHC: 33.6 g/dL (ref 31.5–35.7)
MCV: 92 fL (ref 79–97)
Monocytes Absolute: 0.7 10*3/uL (ref 0.1–0.9)
Monocytes: 9 %
Neutrophils Absolute: 4.9 10*3/uL (ref 1.4–7.0)
Neutrophils: 64 %
Platelets: 289 10*3/uL (ref 150–450)
RBC: 4.72 x10E6/uL (ref 3.77–5.28)
RDW: 12.3 % (ref 11.7–15.4)
WBC: 7.8 10*3/uL (ref 3.4–10.8)

## 2020-11-15 LAB — COMPREHENSIVE METABOLIC PANEL
ALT: 22 IU/L (ref 0–32)
AST: 35 IU/L (ref 0–40)
Albumin/Globulin Ratio: 1.3 (ref 1.2–2.2)
Albumin: 4.2 g/dL (ref 3.8–4.8)
Alkaline Phosphatase: 96 IU/L (ref 44–121)
BUN/Creatinine Ratio: 24 (ref 12–28)
BUN: 19 mg/dL (ref 8–27)
Bilirubin Total: 0.4 mg/dL (ref 0.0–1.2)
CO2: 21 mmol/L (ref 20–29)
Calcium: 9.4 mg/dL (ref 8.7–10.3)
Chloride: 98 mmol/L (ref 96–106)
Creatinine, Ser: 0.78 mg/dL (ref 0.57–1.00)
GFR calc Af Amer: 95 mL/min/{1.73_m2} (ref >59)
GFR calc non Af Amer: 82 mL/min/{1.73_m2} (ref >59)
Globulin, Total: 3.3 g/dL (ref 1.5–4.5)
Glucose: 127 mg/dL — ABNORMAL HIGH (ref 65–99)
Potassium: 4.6 mmol/L (ref 3.5–5.2)
Sodium: 140 mmol/L (ref 134–144)
Total Protein: 7.5 g/dL (ref 6.0–8.5)

## 2020-11-15 LAB — LIPID PANEL WITH LDL/HDL RATIO
Cholesterol, Total: 135 mg/dL (ref 100–199)
HDL: 62 mg/dL (ref >39)
LDL Chol Calc (NIH): 62 mg/dL (ref 0–99)
LDL/HDL Ratio: 1 ratio (ref 0.0–3.2)
Triglycerides: 45 mg/dL (ref 0–149)
VLDL Cholesterol Cal: 11 mg/dL (ref 5–40)

## 2020-11-15 LAB — VITAMIN D 25 HYDROXY (VIT D DEFICIENCY, FRACTURES): Vit D, 25-Hydroxy: 40.5 ng/mL (ref 30.0–100.0)

## 2020-11-15 LAB — HEMOGLOBIN A1C
Est. average glucose Bld gHb Est-mCnc: 146 mg/dL
Hgb A1c MFr Bld: 6.7 % — ABNORMAL HIGH (ref 4.8–5.6)

## 2020-11-15 LAB — VITAMIN B12: Vitamin B-12: 1545 pg/mL — ABNORMAL HIGH (ref 232–1245)

## 2020-11-15 LAB — T4: T4, Total: 6.4 ug/dL (ref 4.5–12.0)

## 2020-11-15 LAB — INSULIN, RANDOM: INSULIN: 4 u[IU]/mL (ref 2.6–24.9)

## 2020-11-15 LAB — TSH: TSH: 0.724 u[IU]/mL (ref 0.450–4.500)

## 2020-11-15 LAB — T3: T3, Total: 119 ng/dL (ref 71–180)

## 2020-11-15 LAB — FOLATE: Folate: >20 ng/mL (ref >3.0)

## 2020-11-18 NOTE — Progress Notes (Unsigned)
Chief Complaint:   OBESITY Laurie Mckinney (MR# [id number]) is a 60 y.o. female who presents for evaluation and treatment of obesity and related comorbidities. Current BMI is Body mass index is 37.38 kg/m. Laurie Mckinney has been struggling with her weight for many years and has been unsuccessful in either losing weight, maintaining weight loss, or reaching her healthy weight goal.  Laurie Mckinney is currently in the action stage of change and ready to dedicate time achieving and maintaining a healthier weight. Laurie Mckinney is interested in becoming our patient and working on intensive lifestyle modifications including (but not limited to) diet and exercise for weight loss.  Laurie Mckinney's habits were reviewed today and are as follows: Her family eats meals together, she thinks her family will eat healthier with her, her desired weight loss is 43-68 lbs, she has been heavy most of her life, she started gaining weight in her 79's, her heaviest weight ever was 282 pounds, she has significant food cravings issues, she snacks frequently in the evenings, she skips meals frequently, she is trying to follow a vegetarian diet, she is frequently drinking liquids with calories, she frequently eats larger portions than normal and she struggles with emotional eating.  Depression Screen Laurie Mckinney (modified PHQ-9) score was 7.  Depression screen Laurie Mckinney 2/9 11/14/2020  Decreased Interest 2  Down, Depressed, Hopeless 1  PHQ - 2 Score 3  Altered sleeping 0  Tired, decreased energy 2  Change in appetite 2  Feeling bad or failure about yourself  0  Trouble concentrating 0  Moving slowly or fidgety/restless 0  Suicidal thoughts 0  PHQ-9 Score 7  Difficult doing work/chores Not difficult at all   Subjective:   1. Other fatigue Laurie Mckinney admits to daytime somnolence and denies waking up still tired. Patent has a history of symptoms of daytime fatigue. Laurie Mckinney generally gets 6 or 8 hours of sleep per night,  and states that she has generally restful sleep. Snoring is not present. Apneic episodes are not present. Epworth Sleepiness Score is 14.  2. Shortness of breath on exertion Laurie Mckinney notes increasing shortness of breath with exercising and seems to be worsening over time with weight gain. She notes getting out of breath sooner with activity than she used to. This has not gotten worse recently. Laurie Mckinney denies shortness of breath at rest or orthopnea.  3. Type 2 diabetes mellitus with other specified complication, without long-term current use of insulin (HCC) Laurie Mckinney is ready to start working on diet, exercise, and weight loss to help improve her diabetes mellitus. She notes polyphagia, especially for carbohydrates.  4. Hypertension associated with diabetes (Laurie Mckinney) Laurie Mckinney's blood pressure is elevated today. It may be due to this being her first visit. She is ready to work on diet and weight loss to help improve.  5. Hyperlipidemia associated with type 2 diabetes mellitus (Laurie Mckinney) Laurie Mckinney is on statin, and she is ready to work on diet and exercise.  6. Vitamin D deficiency Laurie Mckinney is on multivitamins, and she has no recent Vit D level. She notes fatigue.  7. At risk for heart disease Laurie Mckinney is at a higher than average risk for cardiovascular disease due to obesity.   Assessment/Plan:   1. Other fatigue Laurie Mckinney does feel that her weight is causing her energy to be lower than it should be. Fatigue may be related to obesity, depression or many other causes. Labs will be ordered, and in the meanwhile, Laurie Mckinney will focus on self care including making healthy  food choices, increasing physical activity and focusing on stress reduction.  - Vitamin B12 - CBC with Differential/Platelet - EKG 12-Lead - Folate - T3 - T4 - TSH  2. Shortness of breath on exertion Laurie Mckinney does feel that she gets out of breath more easily that she used to when she exercises. Laurie Mckinney shortness of breath  appears to be obesity related and exercise induced. She has agreed to work on weight loss and gradually increase exercise to treat her exercise induced shortness of breath. Will continue to monitor closely.  3. Type 2 diabetes mellitus with other specified complication, without long-term current use of insulin (HCC) Good blood sugar control is important to decrease the likelihood of diabetic complications such as nephropathy, neuropathy, limb loss, blindness, coronary artery disease, and death. Intensive lifestyle modification including diet, exercise and weight loss are the first line of treatment for diabetes. We will check labs today. Laurie Mckinney will start her Category 3 plan, and will follow up as directed.  - Hemoglobin A1c - Insulin, random  4. Hypertension associated with diabetes (Laurie Mckinney) We will check labs today. Laurie Mckinney will start her Category 3 plan, and will work on healthy weight loss and exercise to improve blood pressure control. We will recheck her blood pressure in 2 weeks. She will watch for signs of hypotension as she continues her lifestyle modifications.  - Comprehensive metabolic panel  5. Hyperlipidemia associated with type 2 diabetes mellitus (Laurie Mckinney) Cardiovascular risk and specific lipid/LDL goals reviewed. We discussed several lifestyle modifications today. Laurie Mckinney will start her Category 3 plan, and she will work on exercise and weight loss efforts. We will check labs today. Orders and follow up as documented in patient record.   - Lipid Panel With LDL/HDL Ratio  6. Vitamin D deficiency Low Vitamin D level contributes to fatigue and are associated with obesity, breast, and colon cancer. We will check labs today. Laurie Mckinney will follow-up for routine testing of Vitamin D, at least 2-3 times per year to avoid over-replacement.  - VITAMIN D 25 Hydroxy (Vit-D Deficiency, Fractures)  7. Screening for depression Laurie Mckinney had a positive depression screening. Depression is  commonly associated with obesity and often results in emotional eating behaviors. We will monitor this closely and work on CBT to help improve the non-hunger eating patterns. Referral to Psychology may be required if no improvement is seen as she continues in our clinic.  8. At risk for heart disease Laurie Mckinney was given approximately 30 minutes of coronary artery disease prevention counseling today. She is 60 y.o. female and has risk factors for heart disease including obesity. We discussed intensive lifestyle modifications today with an emphasis on specific weight loss instructions and strategies.   Repetitive spaced learning was employed today to elicit superior memory formation and behavioral change.  9. Class 2 severe obesity with serious comorbidity and body mass index (BMI) of 37.0 to 37.9 in adult, unspecified obesity type Laurie [facility]) Donzella is currently in the action stage of change and her goal is to continue with weight loss efforts. I recommend Page begin the structured treatment plan as follows:  She has agreed to the Category 3 Plan.  Exercise goals: No exercise has been prescribed for now, while concentrate on nutritional changes.  Behavioral modification strategies: increasing lean protein intake and meal planning and cooking strategies.  She was informed of the importance of frequent follow-up visits to maximize her success with intensive lifestyle modifications for her multiple health conditions. She was informed we would discuss her lab results at  her next visit unless there is a critical issue that needs to be addressed sooner. Jamaira agreed to keep her next visit at the agreed upon time to discuss these results.  Objective:   Blood pressure (!) 143/85, pulse 62, temperature 97.7 F (36.5 C), height 5\' 11"  (1.803 m), weight 268 lb (121.6 kg), last menstrual period 01/26/2011, SpO2 100 %. Body mass index is 37.38 kg/m.  EKG: Normal sinus rhythm, rate 60 BPM.  Indirect  Calorimeter completed today shows a VO2 of 325 and a REE of 2264.  Her calculated basal metabolic rate is 9030 thus her basal metabolic rate is better than expected.  General: Cooperative, alert, well developed, in no acute distress. HEENT: Conjunctivae and lids unremarkable. Cardiovascular: Regular rhythm.  Lungs: Normal work of breathing. Neurologic: No focal deficits.   Lab Results  Component Value Date   CREATININE 0.78 11/14/2020   BUN 19 11/14/2020   NA 140 11/14/2020   K 4.6 11/14/2020   CL 98 11/14/2020   CO2 21 11/14/2020   Lab Results  Component Value Date   ALT 22 11/14/2020   AST 35 11/14/2020   ALKPHOS 96 11/14/2020   BILITOT 0.4 11/14/2020   Lab Results  Component Value Date   HGBA1C 6.7 (H) 11/14/2020   HGBA1C 6.5 (A) 01/15/2020   HGBA1C 6.9 (A) 10/04/2019   HGBA1C 6.6 (H) 05/23/2019   HGBA1C 6.8 (H) 04/14/2019   Lab Results  Component Value Date   INSULIN 4.0 11/14/2020   Lab Results  Component Value Date   TSH 0.724 11/14/2020   Lab Results  Component Value Date   CHOL 135 11/14/2020   HDL 62 11/14/2020   LDLCALC 62 11/14/2020   TRIG 45 11/14/2020   CHOLHDL 2.6 10/05/2019   Lab Results  Component Value Date   WBC 7.8 11/14/2020   HGB 14.5 11/14/2020   HCT 43.2 11/14/2020   MCV 92 11/14/2020   PLT 289 11/14/2020   No results found for: IRON, TIBC, FERRITIN  Attestation Statements:   Reviewed by clinician on day of visit: allergies, medications, problem list, medical history, surgical history, family history, social history, and previous encounter notes.   I, Trixie Dredge, am acting as transcriptionist for Dennard Nip, MD.  I have reviewed the above documentation for accuracy and completeness, and I agree with the above. - Dennard Nip, MD

## 2020-11-24 ENCOUNTER — Other Ambulatory Visit: Payer: Self-pay | Admitting: Family Medicine

## 2020-11-24 DIAGNOSIS — F341 Dysthymic disorder: Secondary | ICD-10-CM

## 2020-11-25 NOTE — Telephone Encounter (Signed)
[facility]. Please advise [facility]

## 2020-11-28 ENCOUNTER — Ambulatory Visit (INDEPENDENT_AMBULATORY_CARE_PROVIDER_SITE_OTHER): Payer: BC Managed Care – PPO | Admitting: Family Medicine

## 2020-11-28 ENCOUNTER — Encounter (INDEPENDENT_AMBULATORY_CARE_PROVIDER_SITE_OTHER): Payer: Self-pay | Admitting: Family Medicine

## 2020-11-28 ENCOUNTER — Other Ambulatory Visit: Payer: Self-pay

## 2020-11-28 VITALS — BP 119/77 | HR 66 | Temp 97.9°F | Ht 71.0 in | Wt 262.0 lb

## 2020-11-28 DIAGNOSIS — Z9189 Other specified personal risk factors, not elsewhere classified: Secondary | ICD-10-CM

## 2020-11-28 DIAGNOSIS — E1169 Type 2 diabetes mellitus with other specified complication: Secondary | ICD-10-CM

## 2020-11-28 DIAGNOSIS — R748 Abnormal levels of other serum enzymes: Secondary | ICD-10-CM | POA: Diagnosis not present

## 2020-11-28 DIAGNOSIS — E559 Vitamin D deficiency, unspecified: Secondary | ICD-10-CM

## 2020-11-28 DIAGNOSIS — Z6836 Body mass index (BMI) 36.0-36.9, adult: Secondary | ICD-10-CM

## 2020-11-28 DIAGNOSIS — R7989 Other specified abnormal findings of blood chemistry: Secondary | ICD-10-CM

## 2020-12-02 NOTE — Progress Notes (Signed)
Chief Complaint:   OBESITY Laurie Mckinney is here to discuss her progress with her obesity treatment plan along with follow-up of her obesity related diagnoses. Laurie Mckinney is on the Category 3 Plan and states she is following her eating plan approximately 80% of the time. Laurie Mckinney states she is doing 0 minutes 0 times per week.  Today's visit was #: 2 Starting weight: 268 lbs Starting date: 11/14/2020 Today's weight: 262 lbs Today's date: 11/28/2020 Total lbs lost to date: 6 Total lbs lost since last in-office visit: 6  Interim History: Laurie Mckinney has done very well with weight loss. She did well with her food choices. She did some celebration eating, but tried to portion control. She does note some cravings.  Subjective:   1. Type 2 diabetes mellitus with other specified complication, without long-term current use of insulin (HCC) Laurie Mckinney's A1c was mostly controlled. She notes some neuropathy in her feet and has a history of restless leg syndrome. She has done very well with diet and weight loss.  2. Elevated vitamin B12 level Laurie Mckinney's B12 is higher than expected. She was worried that this could be due to liver issues after googling this but LFT was within normal limits.  3. Vitamin D deficiency Laurie Mckinney is on multivitamins, and her Vit D level is not yet at goal.  Assessment/Plan:   1. Type 2 diabetes mellitus with other specified complication, without long-term current use of insulin (HCC) Good blood sugar control is important to decrease the likelihood of diabetic complications such as nephropathy, neuropathy, limb loss, blindness, coronary artery disease, and death. Intensive lifestyle modification including diet, exercise and weight loss are the first line of treatment for diabetes. Laurie Mckinney will continue with diet and exercise, and will continue to follow up as directed.  2. Elevated vitamin B12 level The diagnosis was reviewed with the patient. Laurie Mckinney will continue with diet  and exercise, and will watch for supplements, which may have extra B12. We will continue to monitor. Orders and follow up as documented in patient record.  3. Vitamin D deficiency Low Vitamin D level contributes to fatigue and are associated with obesity, breast, and colon cancer. Laurie Mckinney agreed to continue taking multivitamin D and will follow-up for routine testing of Vitamin D, at least 2-3 times per year to avoid over-replacement.  4. Class 2 severe obesity with serious comorbidity and body mass index (BMI) of 36.0 to 36.9 in adult, unspecified obesity type [facility]) Laurie Mckinney is currently in the action stage of change. As such, her goal is to continue with weight loss efforts. She has agreed to the Category 3 Plan.   Behavioral modification strategies: increasing lean protein intake and meal planning and cooking strategies.  Laurie Mckinney has agreed to follow-up with our clinic in 2 weeks. She was informed of the importance of frequent follow-up visits to maximize her success with intensive lifestyle modifications for her multiple health conditions.   Objective:   Blood pressure 119/77, pulse 66, temperature 97.9 F (36.6 C), height 5\' 11"  (1.803 m), weight 262 lb (118.8 kg), last menstrual period 01/26/2011, SpO2 97 %. Body mass index is 36.54 kg/m.  General: Cooperative, alert, well developed, in no acute distress. HEENT: Conjunctivae and lids unremarkable. Cardiovascular: Regular rhythm.  Lungs: Normal work of breathing. Neurologic: No focal deficits.   Lab Results  Component Value Date   CREATININE 0.78 11/14/2020   BUN 19 11/14/2020   NA 140 11/14/2020   K 4.6 11/14/2020   CL 98 11/14/2020   CO2 21  11/14/2020   Lab Results  Component Value Date   ALT 22 11/14/2020   AST 35 11/14/2020   ALKPHOS 96 11/14/2020   BILITOT 0.4 11/14/2020   Lab Results  Component Value Date   HGBA1C 6.7 (H) 11/14/2020   HGBA1C 6.5 (A) 01/15/2020   HGBA1C 6.9 (A) 10/04/2019   HGBA1C 6.6 (H)  05/23/2019   HGBA1C 6.8 (H) 04/14/2019   Lab Results  Component Value Date   INSULIN 4.0 11/14/2020   Lab Results  Component Value Date   TSH 0.724 11/14/2020   Lab Results  Component Value Date   CHOL 135 11/14/2020   HDL 62 11/14/2020   LDLCALC 62 11/14/2020   TRIG 45 11/14/2020   CHOLHDL 2.6 10/05/2019   Lab Results  Component Value Date   WBC 7.8 11/14/2020   HGB 14.5 11/14/2020   HCT 43.2 11/14/2020   MCV 92 11/14/2020   PLT 289 11/14/2020   No results found for: IRON, TIBC, FERRITIN  Attestation Statements:   Reviewed by clinician on day of visit: allergies, medications, problem list, medical history, surgical history, family history, social history, and previous encounter notes.  Time spent on visit including pre-visit chart review and post-visit care and charting was 45 minutes.    I, Trixie Dredge, am acting as transcriptionist for Dennard Nip, MD.  I have reviewed the above documentation for accuracy and completeness, and I agree with the above. -  Dennard Nip, MD

## 2020-12-11 DIAGNOSIS — G4733 Obstructive sleep apnea (adult) (pediatric): Secondary | ICD-10-CM | POA: Diagnosis not present

## 2020-12-12 ENCOUNTER — Other Ambulatory Visit: Payer: Self-pay

## 2020-12-12 ENCOUNTER — Ambulatory Visit (INDEPENDENT_AMBULATORY_CARE_PROVIDER_SITE_OTHER): Payer: BC Managed Care – PPO | Admitting: Adult Health

## 2020-12-12 ENCOUNTER — Encounter (INDEPENDENT_AMBULATORY_CARE_PROVIDER_SITE_OTHER): Payer: Self-pay | Admitting: Adult Health

## 2020-12-12 VITALS — BP 117/76 | HR 58 | Temp 97.9°F | Ht 71.0 in | Wt 259.0 lb

## 2020-12-12 DIAGNOSIS — Z6836 Body mass index (BMI) 36.0-36.9, adult: Secondary | ICD-10-CM

## 2020-12-12 DIAGNOSIS — E1159 Type 2 diabetes mellitus with other circulatory complications: Secondary | ICD-10-CM | POA: Diagnosis not present

## 2020-12-12 DIAGNOSIS — E1169 Type 2 diabetes mellitus with other specified complication: Secondary | ICD-10-CM

## 2020-12-12 DIAGNOSIS — I152 Hypertension secondary to endocrine disorders: Secondary | ICD-10-CM | POA: Diagnosis not present

## 2020-12-16 NOTE — Progress Notes (Signed)
Chief Complaint:   OBESITY Laurie Mckinney is here to discuss her progress with her obesity treatment plan along with follow-up of her obesity related diagnoses. Laurie Mckinney is on the Category 3 Plan and states she is following her eating plan approximately 70% of the time. Laurie Mckinney states she is doing yard work.  Today's visit was #: 3 Starting weight: 268 lbs Starting date: 11/14/2020 Today's weight: 259 lbs Today's date: 12/12/2020 Total lbs lost to date: 9 lbs Total lbs lost since last in-office visit: 3 lbs  Interim History: Laurie Mckinney has modified category 3 meal plan. She increased vegetable amount with dinner meal. She states, "I look at category 3 and I make meals from the foods listed."  She has been experimenting with spices and baking techniques for vegetables and lean protein.   Subjective:   1. Hypertension associated with diabetes ([place]) Laurie Mckinney's BP and heart rate are at goal at OV. She is on lisinopril/HCTZ 20-25 mg QD. She denies cardiac symptoms.   BP Readings from Last 3 Encounters:  12/12/20 117/76  11/28/20 119/77  11/14/20 (!) 143/85    2. Type 2 diabetes mellitus with other specified complication, without long-term current use of insulin (HCC) 11/14/2020 A1c 6.7 with elevated blood glucose (127) and excellent insulin level 4.0. She not currently or nor previously been on any anti-diabetic medications.  Lab Results  Component Value Date   HGBA1C 6.7 (H) 11/14/2020   HGBA1C 6.5 (A) 01/15/2020   HGBA1C 6.9 (A) 10/04/2019   Lab Results  Component Value Date   LDLCALC 62 11/14/2020   CREATININE 0.78 11/14/2020   Lab Results  Component Value Date   INSULIN 4.0 11/14/2020    Assessment/Plan:   1. Hypertension associated with diabetes ([place]) Laurie Mckinney is working on healthy weight loss and exercise to improve blood pressure control. We will watch for signs of hypotension as she continues her lifestyle modifications. Continue lisinopril/HCTZ 20-25 mg QD and  category 3 meal plan.  2. Type 2 diabetes mellitus with other specified complication, without long-term current use of insulin (HCC) Good blood sugar control is important to decrease the likelihood of diabetic complications such as nephropathy, neuropathy, limb loss, blindness, coronary artery disease, and death. Intensive lifestyle modification including diet, exercise and weight loss are the first line of treatment for diabetes.   3. Class 2 severe obesity with serious comorbidity and body mass index (BMI) of 36.0 to 36.9 in adult, unspecified obesity type [facility]) Laurie Mckinney is currently in the action stage of change. As such, her goal is to continue with weight loss efforts. She has agreed to the Category 3 Plan and keeping a food journal and adhering to recommended goals of 250-350 calories and 25 g protein.   Exercise goals: As is- Laurie Mckinney modification strategies: increasing lean protein intake, decreasing simple carbohydrates, no skipping meals, meal planning and cooking strategies and planning for success.  Laurie Mckinney has agreed to follow-up with our clinic in 2 weeks. She was informed of the importance of frequent follow-up visits to maximize her success with intensive lifestyle modifications for her multiple health conditions.   Objective:   Blood pressure 117/76, pulse (!) 58, temperature 97.9 F (36.6 C), height 5\' 11"  (1.803 m), weight 259 lb (117.5 kg), last menstrual period 01/26/2011, SpO2 99 %. Body mass index is 36.12 kg/m.  General: Cooperative, alert, well developed, in no acute distress. HEENT: Conjunctivae and lids unremarkable. Cardiovascular: Regular rhythm.  Lungs: Normal work of breathing. Neurologic: No focal deficits.  Lab Results  Component Value Date   CREATININE 0.78 11/14/2020   BUN 19 11/14/2020   NA 140 11/14/2020   K 4.6 11/14/2020   CL 98 11/14/2020   CO2 21 11/14/2020   Lab Results  Component Value Date   ALT 22 11/14/2020   AST 35  11/14/2020   ALKPHOS 96 11/14/2020   BILITOT 0.4 11/14/2020   Lab Results  Component Value Date   HGBA1C 6.7 (H) 11/14/2020   HGBA1C 6.5 (A) 01/15/2020   HGBA1C 6.9 (A) 10/04/2019   HGBA1C 6.6 (H) 05/23/2019   HGBA1C 6.8 (H) 04/14/2019   Lab Results  Component Value Date   INSULIN 4.0 11/14/2020   Lab Results  Component Value Date   TSH 0.724 11/14/2020   Lab Results  Component Value Date   CHOL 135 11/14/2020   HDL 62 11/14/2020   LDLCALC 62 11/14/2020   TRIG 45 11/14/2020   CHOLHDL 2.6 10/05/2019   Lab Results  Component Value Date   WBC 7.8 11/14/2020   HGB 14.5 11/14/2020   HCT 43.2 11/14/2020   MCV 92 11/14/2020   PLT 289 11/14/2020    Attestation Statements:   Reviewed by clinician on day of visit: allergies, medications, problem list, medical history, surgical history, family history, social history, and previous encounter notes.  Time spent on visit including pre-visit chart review and post-visit care and charting was 33 minutes.   Coral Ceo, am acting as Location manager for Mina Marble, NP.  I have reviewed the above documentation for accuracy and completeness, and I agree with the above. -  Katy d. Danford, NP-C

## 2020-12-17 ENCOUNTER — Encounter: Payer: Self-pay | Admitting: Adult Health

## 2020-12-18 ENCOUNTER — Other Ambulatory Visit: Payer: Self-pay | Admitting: Family Medicine

## 2020-12-18 ENCOUNTER — Encounter: Payer: Self-pay | Admitting: Family Medicine

## 2020-12-18 DIAGNOSIS — E1159 Type 2 diabetes mellitus with other circulatory complications: Secondary | ICD-10-CM

## 2020-12-18 DIAGNOSIS — R32 Unspecified urinary incontinence: Secondary | ICD-10-CM

## 2020-12-18 DIAGNOSIS — F341 Dysthymic disorder: Secondary | ICD-10-CM

## 2020-12-18 MED ORDER — LISINOPRIL-HYDROCHLOROTHIAZIDE 20-25 MG PO TABS: ORAL_TABLET | ORAL | 0 refills | Status: DC

## 2020-12-18 MED ORDER — OXYBUTYNIN CHLORIDE ER 10 MG PO TB24: 10.0000 mg | ORAL_TABLET | Freq: Every day | ORAL | 0 refills | Status: DC

## 2020-12-18 NOTE — Telephone Encounter (Signed)
Looks like seen by Dr. Jaynee Eagles for initial consult for neuropathy but was not seen for any additional follow-ups. She should schedule follow-up visit with Dr. Jaynee Eagles

## 2020-12-18 NOTE — Telephone Encounter (Signed)
[facility] well[facility]rin. Please advise [facility] 

## 2020-12-23 NOTE — Telephone Encounter (Signed)
Called and spoke with pt, she accepted appointment 12/31/20 with Ward Givens, NP.

## 2020-12-29 ENCOUNTER — Other Ambulatory Visit: Payer: Self-pay | Admitting: Family Medicine

## 2020-12-29 DIAGNOSIS — F341 Dysthymic disorder: Secondary | ICD-10-CM

## 2020-12-30 ENCOUNTER — Other Ambulatory Visit: Payer: Self-pay

## 2020-12-30 ENCOUNTER — Telehealth: Payer: Self-pay

## 2020-12-30 ENCOUNTER — Encounter (INDEPENDENT_AMBULATORY_CARE_PROVIDER_SITE_OTHER): Payer: Self-pay | Admitting: Adult Health

## 2020-12-30 ENCOUNTER — Ambulatory Visit (INDEPENDENT_AMBULATORY_CARE_PROVIDER_SITE_OTHER): Payer: BC Managed Care – PPO | Admitting: Adult Health

## 2020-12-30 VITALS — BP 123/76 | HR 59 | Temp 98.1°F | Ht 71.0 in | Wt 252.0 lb

## 2020-12-30 DIAGNOSIS — E785 Hyperlipidemia, unspecified: Secondary | ICD-10-CM

## 2020-12-30 DIAGNOSIS — E1169 Type 2 diabetes mellitus with other specified complication: Secondary | ICD-10-CM

## 2020-12-30 DIAGNOSIS — E1159 Type 2 diabetes mellitus with other circulatory complications: Secondary | ICD-10-CM | POA: Diagnosis not present

## 2020-12-30 DIAGNOSIS — F341 Dysthymic disorder: Secondary | ICD-10-CM

## 2020-12-30 DIAGNOSIS — Z6837 Body mass index (BMI) 37.0-37.9, adult: Secondary | ICD-10-CM

## 2020-12-30 DIAGNOSIS — I152 Hypertension secondary to endocrine disorders: Secondary | ICD-10-CM | POA: Diagnosis not present

## 2020-12-30 NOTE — Telephone Encounter (Signed)
Please refill Paxil, pt has appt here 02/10/21, pt has not transferred care, she is going to [facility]

## 2020-12-30 NOTE — Telephone Encounter (Signed)
[facility]. Please advise . [place]

## 2020-12-31 ENCOUNTER — Ambulatory Visit: Payer: BC Managed Care – PPO | Admitting: Adult Health

## 2020-12-31 VITALS — BP 128/72 | HR 60 | Ht 71.0 in | Wt 260.0 lb

## 2020-12-31 DIAGNOSIS — G4733 Obstructive sleep apnea (adult) (pediatric): Secondary | ICD-10-CM

## 2020-12-31 DIAGNOSIS — E1142 Type 2 diabetes mellitus with diabetic polyneuropathy: Secondary | ICD-10-CM | POA: Diagnosis not present

## 2020-12-31 DIAGNOSIS — Z9989 Dependence on other enabling machines and devices: Secondary | ICD-10-CM

## 2020-12-31 MED ORDER — GABAPENTIN 100 MG PO CAPS: 100.0000 mg | ORAL_CAPSULE | Freq: Every evening | ORAL | 1 refills | Status: DC | PRN

## 2020-12-31 MED ORDER — PAROXETINE HCL 20 MG PO TABS: 20.0000 mg | ORAL_TABLET | Freq: Every day | ORAL | 1 refills | Status: DC

## 2020-12-31 NOTE — Progress Notes (Addendum)
PATIENT: Laurie Mckinney DOB: Nov 01, 1960  REASON FOR VISIT: follow up HISTORY FROM: patient  HISTORY OF PRESENT ILLNESS: Today 12/31/20:  Laurie Mckinney is a 60 year old female with a history of obstructive sleep apnea on CPAP and neuropathy.  She returns today for follow-up.  She reports that the CPAP works well for her.  She even tries to use it when she takes a nap.  Denies any issues.  Neuropathy: She feels that is gotten slightly worse.  Still only extends up to the ankles.  Primarily has numbness in the toes.  Notices most of her discomfort when she gets in the bed.  States that she occasionally gets sharp shooting pains but otherwise the numbness is what bothers her the most.  [facility] a patient at [facility]  Reports that she is lost 16 pounds.  Her last hemoglobin A1c was 6.7.  Reports that she is trying to lower her sugar numbers.    HISTORY (copied from Dr. Cathren Laine note) 09/14/2019: Since I last saw patient she was diagnosed with sleep apnea, she underwent sleep study on July 09, 2019 which showed severe mixed sleep apnea and was recommended initiation of CPAP, she is been extremely compliant.  Requested back today by [name] DPM for a new issue, she had a foot exam because she has numbness in the bottom of her feet, if she has slippers on she can't tell whether her shoes are on or off, numbness, when she shaves she has numbness up her legs, a numbing sensation to above the ankles. She saw a podiatrist, she saw Dr. Redmond School.  She is obese, she has diabetes, last HgbA1c was 6.6(reviewed labs) but has been higher per patient. Brother drank a 6-pack for 30 years and had neuropathy but no other family history. Really numb, no significant pain, notices it more at night, nothing makes it better or worse, her feet get hot, ongoing for years, slowly progressive, symmetric. No other focal neurologic deficits, associated symptoms, inciting events or modifiable factors..No  weakness. No lumbar radiculopathy or connection with the low back.    I reviewed patient's notes from Dr. [name], patient has symptoms in both feet, she has noticed this as an example shaving her legs, the feet feels a sensitive up to her ankle, she does notice numbness more so at rest without her shoes on, noticeable more so this year, recently diagnosed as diabetic, unclear how long she was prediabetic for a while, she has been advised on trying to be diet controlled, lots of healthcare issues, dizziness, slurring speech, worsening control of hands, following, she has been evaluated by neurology and cardiology and diagnosed with A. 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 fib and Provigil for her sleeping disorder, palpable pedal pulses, pronounced fifth metatarsal head bilaterally with callus under the right loss of deep vibratory sensation at the left foot metatarsal heads, intact but diminished positional testing of the hallux, x-rays (reviewed reports) mild residual metatarsus abductus foot type, with tailor's bunion appreciated bilaterally, spurring noted on the distal phalanx medially, mild deformity bilaterally, on lateral view relatively high arched foot type, no subluxation, dislocation of arch diagnosed with peripheral neuropathy and numbness of the foot.  TSH nml, B12 976, hepc  negative.   I reviewed MRi cervical spine images and agree with the following: MRI cervical spine (without) demonstrating: - At C5-6: disc bulging with borderline mild spinal stenosis and moderate left foraminal stenosis; no cord signal abnormalities.  HPI:  Laurie Mckinney is a 60 y.o. female here as requested by Dr. Glade Lloyd for aphasia.  Past medical history obesity,  Mobitz type II second-degree heart block, migraines, hypertension, diabetes, a fib, asthma, adjustment insomnia.She is here with her husband. She "cannot control her body", she is wobbly, she is short of breath, she misjudges everything, she throws things and they don;t end up where she wants, her long term memory is doing well but can;t get her brain to function, poor eye-hand coordination, she feels very fatigued. Here with hr husband who provides much information. She feels her depression and anxiety, no focal weakness, no facial droop, no dysarthria. 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   Reviewed notes, labs and imaging from outside physicians, which showed: I reviewed Dr. Madelyn Brunner notes.  She was most recently seen earlier this month.  Patient reported dizziness and new onset atrial fibrillation ended up seeing A. fib clinic July 15 and was sent to the emergency room for possible stroke.  She continues to feel "dizzy and loopy".  She has trouble with coordination between her brain or arms and not feeling herself.  She only has a note writing her out of work through the end of this week and that she is not safe to drive or go back to work next week.  She continues to feel dizzy and loopy.  Trouble with concentration between her brain and arms.  Trouble finding words at times are making clear thoughts at times.  Processing speed seems off.  She is concerned about sleep apnea given history of snoring, she does have pauses in her sleep according to her husband, she is gained 30 pounds in the last 4 years  Personally reviewed MRI of the brain April 19, 2019 and agree with the following:No acute or significant brain finding. Normal study with exception of a few punctate foci of T2 and FLAIR signal within the white matter consistent with minimal small vessel change.  There is a significant amount of stress, she works overnight, she doesn't sleep well.  TSH normal hgba1c 6.8 Cbc/cmp unremarkable B12 976   REVIEW OF SYSTEMS: Out of a complete 14 system review of symptoms, the patient complains only of the following symptoms, and all other reviewed systems are negative.    Fatigue severity score 33 Epworth sleepiness score 14  ALLERGIES: No Known Allergies  HOME MEDICATIONS: Outpatient Medications Prior to Visit  Medication Sig Dispense Refill  . acetaminophen (TYLENOL) 650 MG CR tablet Take 650 mg by mouth every 8 (eight) hours as needed for pain.    Marland Kitchen apixaban (ELIQUIS) 5 MG TABS tablet Take 5 mg by mouth 2 (two) times daily.    Marland Kitchen atorvastatin (LIPITOR) 10 MG tablet Take 1 tablet (10 mg total) by mouth daily. 90 tablet 1  . buPROPion (WELLBUTRIN SR) 100 MG 12 hr tablet TAKE 1 TABLET BY MOUTH EVERY DAY 90 tablet 0  . Lancets (ONETOUCH DELICA PLUS [id number]) MISC USE AND DISCARD 1 LANCET   DAILY 100 each 3  . lisinopril-hydrochlorothiazide (ZESTORETIC) 20-25 MG tablet TAKE 1 TABLET DAILY (DOSE  INCREASE) 60 tablet 0  .  Misc Natural Products (IMMUNE FORMULA PO) Take 2 tablets by mouth daily. immuneti    . Multiple Vitamins-Minerals (CENTRUM SILVER 50+WOMEN) TABS Take 1 tablet by mouth daily.    . Omega-3 Fatty Acids (FISH OIL) 1200 MG CAPS Take 2 capsules by mouth at bedtime.    Glory Rosebush VERIO test strip USE AND DISCARD 1 TEST     STRIP AS NEEDED AS         INSTRUCTED 100 each 3  . oxybutynin (DITROPAN-XL) 10 MG 24 hr tablet Take 1 tablet (10 mg total) by mouth at bedtime. 60 tablet 0  . PARoxetine (PAXIL) 20 MG tablet TAKE 1 TABLET DAILY 90 tablet 0  . UNABLE TO FIND Take 2 tablets by mouth in the morning and at bedtime. Med Name: Healthy Feet and Nerve Supplement.     No facility-administered medications prior to visit.    PAST MEDICAL HISTORY: Past Medical History:  Diagnosis Date  . Adjustment insomnia    SHIFT WORK  . Allergy     RHINITIS  . Asthma   . Atrial flutter [facility]) 02/26/15   [facility] [place]  . Back pain   . Bursitis    right hip  . Constipation   . Depression   . Diabetes mellitus without complication ([place])   . History of atrial fibrillation   . History of cancer   . Hypertension   . Incontinence   . Joint pain   . Migraine headache   . Mobitz type 2 second degree heart block   . Neuropathy   . Obesity   . Other fatigue   . Shortness of breath on exertion   . Sleep apnea     PAST SURGICAL HISTORY: Past Surgical History:  Procedure Laterality Date  . ABDOMINAL HYSTERECTOMY    . ATRIAL FIBRILLATION ABLATION N/A 10/27/2019   Procedure: ATRIAL FIBRILLATION ABLATION;  Surgeon: Constance Haw, MD;  Location: [facility];  Service: Cardiovascular;  Laterality: N/A;  . CARDIOVERSION N/A 05/25/2019   Procedure: CARDIOVERSION;  Surgeon: Arnoldo Lenis, MD;  Location: [facility];  Service: Endoscopy;  Laterality: N/A;  . COLONOSCOPY  2011   Dr.Brodie  . ROBOTIC ASSISTED TOTAL HYSTERECTOMY WITH BILATERAL SALPINGO OOPHERECTOMY Bilateral 02/05/2015   Procedure: ROBOTIC ASSISTED TOTAL LAPAROSCOPIC HYSTERECTOMY WITH BILATERAL SALPINGO OOPHORECTOMY;  Surgeon: Everitt Amber, MD;  Location: [facility];  Service: Gynecology;  Laterality: Bilateral;    FAMILY HISTORY: Family History  Problem Relation Age of Onset  . Arthritis Mother   . Heart disease Father   . Hypertension Father   . Hyperlipidemia Father   . Sudden death Father   . Neuropathy Brother        both legs   . Diabetes Brother        no treatment that the pt knows of   . Other Brother        drank a 6 pack of beer daily x 30 years     SOCIAL HISTORY: Social History   Socioeconomic History  . Marital status: Married    Spouse name: Velina Drollinger  . Number of children: 2  . Years of education: Not on file  . Highest education level: Not on file  Occupational History  . Occupation: Conservation officer, historic buildings for parent   Tobacco Use  . Smoking status: Never Smoker  . Smokeless tobacco: Never Used  Vaping Use  . Vaping Use: Never used  Substance and Sexual Activity  . Alcohol use: Yes  Alcohol/week: 1.0 standard drink    Types: 1 Standard drinks or equivalent per week  . Drug use: No  . Sexual activity: Yes    Partners: Male    Comment: Has had hysterectomy.  Other Topics Concern  . Not on file  Social History Narrative   Lives at home with husband   Right handed   Caffeine: 1 cup of coffee in the morning and <20 oz of soda per day   Social Determinants of Health   Financial Resource Strain: Not on file  Food Insecurity: Not on file  Transportation Needs: Not on file  Physical Activity: Not on file  Stress: Not on file  Social Connections: Not on file  Intimate Partner Violence: Not on file      PHYSICAL EXAM  Vitals:   12/31/20 0840  BP: 128/72  Pulse: 60  Weight: 260 lb (117.9 kg)  Height: 5\' 11"  (1.803 m)   Body mass index is 36.26 kg/m.  Generalized: Well developed, in no acute distress   Neurological examination  Mentation: Alert oriented to time, place, history taking. Follows all commands speech and language fluent Cranial nerve II-XII: Pupils were equal round reactive to light. Extraocular movements were full, visual field were full on confrontational test. Facial sensation and strength were normal. Uvula tongue midline. Head turning and shoulder shrug  were normal and symmetric. Motor: The motor testing reveals 5 over 5 strength of all 4 extremities. Good symmetric motor tone is noted throughout.  Sensory: Pinprick and vibration sensation decreased in the lower extremities in a stocking-like pattern.  No evidence of extinction is noted.  Coordination: Cerebellar testing reveals good finger-nose-finger and heel-to-shin bilaterally.  Gait and station: Gait is normal.  Reflexes: Deep tendon reflexes are symmetric and normal bilaterally.   DIAGNOSTIC DATA (LABS,  IMAGING, TESTING) - I reviewed patient records, labs, notes, testing and imaging myself where available.  Lab Results  Component Value Date   WBC 7.8 11/14/2020   HGB 14.5 11/14/2020   HCT 43.2 11/14/2020   MCV 92 11/14/2020   PLT 289 11/14/2020      Component Value Date/Time   NA 140 11/14/2020 0944   K 4.6 11/14/2020 0944   CL 98 11/14/2020 0944   CO2 21 11/14/2020 0944   GLUCOSE 127 (H) 11/14/2020 0944   GLUCOSE 133 10/11/2019 0945   BUN 19 11/14/2020 0944   CREATININE 0.78 11/14/2020 0944   CREATININE 0.74 10/11/2019 0945   CALCIUM 9.4 11/14/2020 0944   PROT 7.5 11/14/2020 0944   ALBUMIN 4.2 11/14/2020 0944   AST 35 11/14/2020 0944   ALT 22 11/14/2020 0944   ALKPHOS 96 11/14/2020 0944   BILITOT 0.4 11/14/2020 0944   GFRNONAA 82 11/14/2020 0944   GFRAA 95 11/14/2020 0944   Lab Results  Component Value Date   CHOL 135 11/14/2020   HDL 62 11/14/2020   LDLCALC 62 11/14/2020   TRIG 45 11/14/2020   CHOLHDL 2.6 10/05/2019   Lab Results  Component Value Date   HGBA1C 6.7 (H) 11/14/2020   Lab Results  Component Value Date   VITAMINB12 1,545 (H) 11/14/2020   Lab Results  Component Value Date   TSH 0.724 11/14/2020      ASSESSMENT AND PLAN 60 y.o. year old female  has a past medical history of Adjustment insomnia, Allergy, Asthma, Atrial flutter ([place]) (02/26/15), Back pain, Bursitis, Constipation, Depression, Diabetes mellitus without complication ([place]), History of atrial fibrillation, History of cancer, Hypertension, Incontinence, Joint pain, Migraine headache, Mobitz  type 2 second degree heart block, Neuropathy, Obesity, Other fatigue, Shortness of breath on exertion, and Sleep apnea. here with: -Good compliance 1.  Obstructive sleep apnea on CPAP   --Good treatment of apnea --Encouraged her to continue using CPAP nightly and greater than 4 hours each night  2.  Peripheral neuropathy  --She will start gabapentin 100 mg at bedtime as needed --Reviewed  potential side effects of gabapentin with the patient and provided her with a handout reviewing the medication --Encouraged patient to continue to monitor her diet and encouraged exercise  Advised if symptoms worsen or she develops new symptoms she should let us know.  Follow-up in 6 months or sooner if needed   I spent 30 minutes of face-to-face and non-face-to-face time with patient.  This included previsit chart review, lab review, study review, order entry, electronic health record documentation, patient education.  Ward Givens, MSN, NP-C 12/31/2020, 8:59 AM [facility] [address], [place], [place] [address] [phone]   Made any corrections needed, and agree with history, physical, neuro exam,assessment and plan as stated.     Sarina Ill, MD [facility]

## 2020-12-31 NOTE — Progress Notes (Signed)
Chief Complaint:   OBESITY Laurie Mckinney is here to discuss her progress with her obesity treatment plan along with follow-up of her obesity related diagnoses. Laurie Mckinney is on the Category 3 Plan and states she is following her eating plan approximately 50% of the time. Laurie Mckinney states she is cleaning and doing yard work 90 minutes 1 times per week.  Today's visit was #: 4 Starting weight: 268 lbs Starting date: 11/14/2020 Today's weight: 252 lbs Today's date: 12/30/2020 Total lbs lost to date: 16 lbs Total lbs lost since last in-office visit: 7 lbs  Interim History: Laurie Mckinney has been focusing on increasing protein intake and limiting food off plan. She has really increased daily vegetable intake. She enjoyed small pieces of cake and sweets. She is following Laurie Mckinney mindset to keep servings in "check".  Subjective:   1. Hypertension associated with diabetes (Laurie Mckinney) Laurie Mckinney's BP/HR are excellent at Laurie Mckinney. She is on lisinopril/HCTZ 20-25 mg QD.  BP Readings from Last 3 Encounters:  12/31/20 128/72  12/30/20 123/76  12/12/20 117/76    2. Hyperlipidemia associated with type 2 diabetes mellitus (Laurie Mckinney) Laurie Mckinney is on atorvastatin 10 mg QD. She denies myalgias.  Lab Results  Component Value Date   ALT 22 11/14/2020   AST 35 11/14/2020   ALKPHOS 96 11/14/2020   BILITOT 0.4 11/14/2020   Lab Results  Component Value Date   CHOL 135 11/14/2020   HDL 62 11/14/2020   LDLCALC 62 11/14/2020   TRIG 45 11/14/2020   CHOLHDL 2.6 10/05/2019    Assessment/Plan:   1. Hypertension associated with diabetes (Laurie Mckinney) Laurie Mckinney is working on healthy weight loss and exercise to improve blood pressure control. We will watch for signs of hypotension as she continues her lifestyle modifications. Continue current anti-hypertensive regimen.  2. Hyperlipidemia associated with type 2 diabetes mellitus (Laurie Mckinney) Cardiovascular risk and specific lipid/LDL goals reviewed.  We discussed several lifestyle modifications  today and Laurie Mckinney will continue to work on diet, exercise and weight loss efforts. Orders and follow up as documented in patient record. Continue statin therapy.  Counseling Intensive lifestyle modifications are the first line treatment for this issue. . Dietary changes: Increase soluble fiber. Decrease simple carbohydrates. . Exercise changes: Moderate to vigorous-intensity aerobic activity 150 minutes per week if tolerated. . Lipid-lowering medications: see documented in medical record.  3. Current BMI 35.16 Laurie Mckinney is currently in the action stage of change. As such, her goal is to continue with weight loss efforts. She has agreed to the Category 3 Plan.   Bring bike to Laurie Mckinney for tune up at local bike shop  Exercise goals: As is  Behavioral modification strategies: increasing lean protein intake, meal planning and cooking strategies and planning for success.  Laurie Mckinney has agreed to follow-up with our clinic in 2 weeks. She was informed of the importance of frequent follow-up visits to maximize her success with intensive lifestyle modifications for her multiple health conditions.   Objective:   Blood pressure 123/76, pulse (!) 59, temperature 98.1 F (36.7 C), height 5\' 11"  (1.803 m), weight 252 lb (114.3 kg), last menstrual period 01/26/2011, SpO2 99 %. Body mass index is 35.15 kg/m.  General: Cooperative, alert, well developed, in no acute distress. HEENT: Conjunctivae and lids unremarkable. Cardiovascular: Regular rhythm.  Lungs: Normal work of breathing. Neurologic: No focal deficits.   Lab Results  Component Value Date   CREATININE 0.78 11/14/2020   BUN 19 11/14/2020   NA 140 11/14/2020   K 4.6 11/14/2020   CL 98  11/14/2020   CO2 21 11/14/2020   Lab Results  Component Value Date   ALT 22 11/14/2020   AST 35 11/14/2020   ALKPHOS 96 11/14/2020   BILITOT 0.4 11/14/2020   Lab Results  Component Value Date   HGBA1C 6.7 (H) 11/14/2020   HGBA1C 6.5 (A) 01/15/2020    HGBA1C 6.9 (A) 10/04/2019   HGBA1C 6.6 (H) 05/23/2019   HGBA1C 6.8 (H) 04/14/2019   Lab Results  Component Value Date   INSULIN 4.0 11/14/2020   Lab Results  Component Value Date   TSH 0.724 11/14/2020   Lab Results  Component Value Date   CHOL 135 11/14/2020   HDL 62 11/14/2020   LDLCALC 62 11/14/2020   TRIG 45 11/14/2020   CHOLHDL 2.6 10/05/2019   Lab Results  Component Value Date   WBC 7.8 11/14/2020   HGB 14.5 11/14/2020   HCT 43.2 11/14/2020   MCV 92 11/14/2020   PLT 289 11/14/2020   Attestation Statements:   Reviewed by clinician on day of visit: allergies, medications, problem list, medical history, surgical history, family history, social history, and previous encounter notes.  Time spent on visit including pre-visit chart review and post-visit care and charting was 31 minutes.   Coral Ceo, am acting as Location manager for Mina Marble, NP.  I have reviewed the above documentation for accuracy and completeness, and I agree with the above. -  Nakyiah Kuck d. Rechelle Niebla, NP-C

## 2020-12-31 NOTE — Patient Instructions (Signed)
Your Plan:  Start gabapentin 100 mg at bedtime if needed Continue CPAP If your symptoms worsen or you develop new symptoms please let us know.   Thank you for coming to see [place] at [facility]. I hope we have been able to provide you high quality care today.  You may receive a patient satisfaction survey over the next few weeks. We would appreciate your feedback and comments so that we may continue to improve ourselves and the health of our patients.  Gabapentin capsules or tablets What is this medicine? GABAPENTIN (GA ba pen tin) is used to control seizures in certain types of epilepsy. It is also used to treat certain types of nerve pain. This medicine may be used for other purposes; ask your health care provider or pharmacist if you have questions. COMMON BRAND NAME(S): Active-PAC with Gabapentin, Orpha Bur, Gralise, Neurontin What should I tell my health care provider before I take this medicine? They need to know if you have any of these conditions:  history of drug abuse or alcohol abuse problem  kidney disease  lung or breathing disease  suicidal thoughts, plans, or attempt; a previous suicide attempt by you or a family member  an unusual or allergic reaction to gabapentin, other medicines, foods, dyes, or preservatives  pregnant or trying to get pregnant  breast-feeding How should I use this medicine? Take this medicine by mouth with a glass of water. Follow the directions on the prescription label. You can take it with or without food. If it upsets your stomach, take it with food. Take your medicine at regular intervals. Do not take it more often than directed. Do not stop taking except on your doctor's advice. If you are directed to break the 600 or 800 mg tablets in half as part of your dose, the extra half tablet should be used for the next dose. If you have not used the extra half tablet within 28 days, it should be thrown away. A special MedGuide will  be given to you by the pharmacist with each prescription and refill. Be sure to read this information carefully each time. Talk to your pediatrician regarding the use of this medicine in children. While this drug may be prescribed for children as young as 3 years for selected conditions, precautions do apply. Overdosage: If you think you have taken too much of this medicine contact a poison control center or emergency room at once. NOTE: This medicine is only for you. Do not share this medicine with others. What if I miss a dose? If you miss a dose, take it as soon as you can. If it is almost time for your next dose, take only that dose. Do not take double or extra doses. What may interact with this medicine? This medicine may interact with the following medications:  alcohol  antihistamines for allergy, cough, and cold  certain medicines for anxiety or sleep  certain medicines for depression like amitriptyline, fluoxetine, sertraline  certain medicines for seizures like phenobarbital, primidone  certain medicines for stomach problems  general anesthetics like halothane, isoflurane, methoxyflurane, propofol  local anesthetics like lidocaine, pramoxine, tetracaine  medicines that relax muscles for surgery  narcotic medicines for pain  phenothiazines like chlorpromazine, mesoridazine, prochlorperazine, thioridazine This list may not describe all possible interactions. Give your health care provider a list of all the medicines, herbs, non-prescription drugs, or dietary supplements you use. Also tell them if you smoke, drink alcohol, or use illegal drugs. Some items may interact  with your medicine. What should I watch for while using this medicine? Visit your doctor or health care provider for regular checks on your progress. You may want to keep a record at home of how you feel your condition is responding to treatment. You may want to share this information with your doctor or health  care provider at each visit. You should contact your doctor or health care provider if your seizures get worse or if you have any new types of seizures. Do not stop taking this medicine or any of your seizure medicines unless instructed by your doctor or health care provider. Stopping your medicine suddenly can increase your seizures or their severity. This medicine may cause serious skin reactions. They can happen weeks to months after starting the medicine. Contact your health care provider right away if you notice fevers or flu-like symptoms with a rash. The rash may be red or purple and then turn into blisters or peeling of the skin. Or, you might notice a red rash with swelling of the face, lips or lymph nodes in your neck or under your arms. Wear a medical identification bracelet or chain if you are taking this medicine for seizures, and carry a card that lists all your medications. You may get drowsy, dizzy, or have blurred vision. Do not drive, use machinery, or do anything that needs mental alertness until you know how this medicine affects you. To reduce dizzy or fainting spells, do not sit or stand up quickly, especially if you are an older patient. Alcohol can increase drowsiness and dizziness. Avoid alcoholic drinks. Your mouth may get dry. Chewing sugarless gum or sucking hard candy, and drinking plenty of water will help. The use of this medicine may increase the chance of suicidal thoughts or actions. Pay special attention to how you are responding while on this medicine. Any worsening of mood, or thoughts of suicide or dying should be reported to your health care provider right away. Women who become pregnant while using this medicine may enroll in the [place] Pregnancy Registry by calling [phone]. This registry collects information about the safety of antiepileptic drug use during pregnancy. What side effects may I notice from receiving this medicine? Side  effects that you should report to your doctor or health care professional as soon as possible:  allergic reactions like skin rash, itching or hives, swelling of the face, lips, or tongue  breathing problems  rash, fever, and swollen lymph nodes  redness, blistering, peeling or loosening of the skin, including inside the mouth  suicidal thoughts, mood changes Side effects that usually do not require medical attention (report to your doctor or health care professional if they continue or are bothersome):  dizziness  drowsiness  headache  nausea, vomiting  swelling of ankles, feet, hands  tiredness This list may not describe all possible side effects. Call your doctor for medical advice about side effects. You may report side effects to FDA at 1-800-FDA-1088. Where should I keep my medicine? Keep out of reach of children. This medicine may cause accidental overdose and death if it taken by other adults, children, or pets. Mix any unused medicine with a substance like cat litter or coffee grounds. Then throw the medicine away in a sealed container like a sealed bag or a coffee can with a lid. Do not use the medicine after the expiration date. Store at room temperature between 15 and 30 degrees C (59 and 86 degrees F). NOTE: This sheet  is a summary. It may not cover all possible information. If you have questions about this medicine, talk to your doctor, pharmacist, or health care provider.  2021 Elsevier/Gold Standard (2018-12-23 14:16:43)

## 2021-01-13 ENCOUNTER — Ambulatory Visit (INDEPENDENT_AMBULATORY_CARE_PROVIDER_SITE_OTHER): Payer: BC Managed Care – PPO | Admitting: Adult Health

## 2021-01-14 ENCOUNTER — Encounter (INDEPENDENT_AMBULATORY_CARE_PROVIDER_SITE_OTHER): Payer: Self-pay | Admitting: Adult Health

## 2021-01-14 ENCOUNTER — Ambulatory Visit (INDEPENDENT_AMBULATORY_CARE_PROVIDER_SITE_OTHER): Payer: BC Managed Care – PPO | Admitting: Adult Health

## 2021-01-14 ENCOUNTER — Other Ambulatory Visit: Payer: Self-pay

## 2021-01-14 VITALS — BP 119/77 | HR 84 | Temp 97.5°F | Ht 71.0 in | Wt 254.0 lb

## 2021-01-14 DIAGNOSIS — K5900 Constipation, unspecified: Secondary | ICD-10-CM

## 2021-01-14 DIAGNOSIS — Z6836 Body mass index (BMI) 36.0-36.9, adult: Secondary | ICD-10-CM | POA: Diagnosis not present

## 2021-01-14 DIAGNOSIS — E1169 Type 2 diabetes mellitus with other specified complication: Secondary | ICD-10-CM

## 2021-01-15 ENCOUNTER — Other Ambulatory Visit: Payer: Self-pay | Admitting: Family Medicine

## 2021-01-15 DIAGNOSIS — E1159 Type 2 diabetes mellitus with other circulatory complications: Secondary | ICD-10-CM

## 2021-01-15 DIAGNOSIS — K5900 Constipation, unspecified: Secondary | ICD-10-CM | POA: Insufficient documentation

## 2021-01-15 NOTE — Progress Notes (Signed)
Chief Complaint:   OBESITY Laurie Mckinney is here to discuss her progress with her obesity treatment plan along with follow-up of her obesity related diagnoses. Laurie Mckinney is on the Category 3 Plan and states she is following her eating plan approximately 66% of the time. Laurie Mckinney states she is not currently exercising.  Today's visit was #: 5 Starting weight: 268 lbs Starting date: 11/14/2020 Today's weight: 254 lbs Today's date: 01/14/2021 Total lbs lost to date: 14 Total lbs lost since last in-office visit: 0  Interim History: In the past 2 weeks [name] attended a wedding, hosted out of town guests, traveled to [employer]- ate out every meal, and started [employer] working class at [employer].  Her bioimpedance reveals increased water weight- up 13 lbs of water.  She is on lisinopril/HCTZ 20-25 mg QD.  Subjective:   1. Type 2 diabetes mellitus with other specified complication, without long-term current use of insulin (HCC) Laurie Mckinney's 11/14/2020 A1c 6.7 with elevated BG of 127 and normal insulin level of 4.0. Her levels are diet controlled, therefore she is not on any anti-diabetic medications.  Lab Results  Component Value Date   HGBA1C 6.7 (H) 11/14/2020   HGBA1C 6.5 (A) 01/15/2020   HGBA1C 6.9 (A) 10/04/2019   Lab Results  Component Value Date   LDLCALC 62 11/14/2020   CREATININE 0.78 11/14/2020   Lab Results  Component Value Date   INSULIN 4.0 11/14/2020    2. Constipation, unspecified constipation type Anabela estimates to have a BM every 4 days. This started 6 weeks ago.  Assessment/Plan:   1. Type 2 diabetes mellitus with other specified complication, without long-term current use of insulin (HCC) Good blood sugar control is important to decrease the likelihood of diabetic complications such as nephropathy, neuropathy, limb loss, blindness, coronary artery disease, and death. Intensive lifestyle modification including diet, exercise and weight loss are  the first line of treatment for diabetes. Continue category 3 meal plan and regular activity.  2. Constipation, unspecified constipation type Laurie Mckinney was informed that a decrease in bowel movement frequency is normal while losing weight, but stools should not be hard or painful. Orders and follow up as documented in patient record.   Counseling Getting to Good Bowel Health: Your goal is to have one soft bowel movement each day. Drink at least 8 glasses of water each day. Eat plenty of fiber (goal is over 25 grams each day). It is best to get most of your fiber from dietary sources which includes leafy green vegetables, fresh fruit, and whole grains. You may need to add fiber with the help of OTC fiber supplements. These include Metamucil, Citrucel, and Flaxseed. If you are still having trouble, try adding Miralax (per manufacturer instructions) or Magnesium Citrate. If all of these changes do not work, [employer].  3. Obesity with current BMI 35.5 Laurie Mckinney is currently in the action stage of change. As such, her goal is to continue with weight loss efforts. She has agreed to the Category 3 Plan.   Exercise goals: No exercise has been prescribed at this time.  Behavioral modification strategies: increasing lean protein intake, increasing water intake, meal planning and cooking strategies, keeping healthy foods in the home and planning for success.  Monica has agreed to follow-up with our clinic in 2 weeks. She was informed of the importance of frequent follow-up visits to maximize her success with intensive lifestyle modifications for her multiple health conditions.   Objective:   Blood pressure 119/77, pulse  84, temperature (!) 97.5 F (36.4 C), height 5\' 11"  (1.803 m), weight 254 lb (115.2 kg), last menstrual period 01/26/2011, SpO2 98 %. Body mass index is 35.43 kg/m.  General: Cooperative, alert, well developed, in no acute distress. HEENT: Conjunctivae and lids  unremarkable. Cardiovascular: Regular rhythm.  Lungs: Normal work of breathing. Neurologic: No focal deficits.   Lab Results  Component Value Date   CREATININE 0.78 11/14/2020   BUN 19 11/14/2020   NA 140 11/14/2020   K 4.6 11/14/2020   CL 98 11/14/2020   CO2 21 11/14/2020   Lab Results  Component Value Date   ALT 22 11/14/2020   AST 35 11/14/2020   ALKPHOS 96 11/14/2020   BILITOT 0.4 11/14/2020   Lab Results  Component Value Date   HGBA1C 6.7 (H) 11/14/2020   HGBA1C 6.5 (A) 01/15/2020   HGBA1C 6.9 (A) 10/04/2019   HGBA1C 6.6 (H) 05/23/2019   HGBA1C 6.8 (H) 04/14/2019   Lab Results  Component Value Date   INSULIN 4.0 11/14/2020   Lab Results  Component Value Date   TSH 0.724 11/14/2020   Lab Results  Component Value Date   CHOL 135 11/14/2020   HDL 62 11/14/2020   LDLCALC 62 11/14/2020   TRIG 45 11/14/2020   CHOLHDL 2.6 10/05/2019   Lab Results  Component Value Date   WBC 7.8 11/14/2020   HGB 14.5 11/14/2020   HCT 43.2 11/14/2020   MCV 92 11/14/2020   PLT 289 11/14/2020    Attestation Statements:   Reviewed by clinician on day of visit: allergies, medications, problem list, medical history, surgical history, family history, social history, and previous encounter notes.  Time spent on visit including pre-visit chart review and post-visit care and charting was 32 minutes.   Coral Ceo, am acting as Location manager for Mina Marble, NP.  I have reviewed the above documentation for accuracy and completeness, and I agree with the above. -  Hisae Decoursey d. Yosiah Jasmin, NP-C

## 2021-01-23 ENCOUNTER — Other Ambulatory Visit: Payer: Self-pay | Admitting: Family Medicine

## 2021-01-23 DIAGNOSIS — R32 Unspecified urinary incontinence: Secondary | ICD-10-CM

## 2021-01-29 ENCOUNTER — Encounter (INDEPENDENT_AMBULATORY_CARE_PROVIDER_SITE_OTHER): Payer: Self-pay | Admitting: Family Medicine

## 2021-01-29 ENCOUNTER — Ambulatory Visit (INDEPENDENT_AMBULATORY_CARE_PROVIDER_SITE_OTHER): Payer: BC Managed Care – PPO | Admitting: Family Medicine

## 2021-01-29 ENCOUNTER — Other Ambulatory Visit: Payer: Self-pay

## 2021-01-29 VITALS — BP 117/72 | HR 60 | Temp 98.1°F | Ht 71.0 in | Wt 249.0 lb

## 2021-01-29 DIAGNOSIS — Z9189 Other specified personal risk factors, not elsewhere classified: Secondary | ICD-10-CM | POA: Diagnosis not present

## 2021-01-29 DIAGNOSIS — F419 Anxiety disorder, unspecified: Secondary | ICD-10-CM

## 2021-01-29 DIAGNOSIS — Z6837 Body mass index (BMI) 37.0-37.9, adult: Secondary | ICD-10-CM

## 2021-01-29 DIAGNOSIS — E1169 Type 2 diabetes mellitus with other specified complication: Secondary | ICD-10-CM | POA: Diagnosis not present

## 2021-01-29 DIAGNOSIS — F32A Depression, unspecified: Secondary | ICD-10-CM

## 2021-01-29 MED ORDER — OZEMPIC (0.25 OR 0.5 MG/DOSE) 2 MG/1.5ML ~~LOC~~ SOPN: 0.2500 mg | PEN_INJECTOR | SUBCUTANEOUS | 0 refills | Status: DC

## 2021-01-30 DIAGNOSIS — G4733 Obstructive sleep apnea (adult) (pediatric): Secondary | ICD-10-CM | POA: Diagnosis not present

## 2021-01-30 NOTE — Progress Notes (Signed)
Chief Complaint:   OBESITY Laurie Mckinney is here to discuss her progress with her obesity treatment plan along with follow-up of her obesity related diagnoses. Laurie Mckinney is on the Category 3 Plan and states she is following her eating plan approximately 66% of the time. Laurie Mckinney states she is not currently exercising.  Today's visit was #: 6 Starting weight: 268 lbs Starting date: 11/14/2020 Today's weight: 249 lbs Today's date: 01/29/2021 Total lbs lost to date: 19 Total lbs lost since last in-office visit: 5  Interim History: Last few weeks have been stressful. Pt does feel she has slightly less inflammation than last appt. Fruit, greek yogurt/cottage cheese with milk or eggs (3), toast for breakfast; lunch has lots of variety, as pt is very busy during that time. She does follow dinner to meal plan. She always has [place] bar for after dinner treat. She sometimes brings her mother out for lunch. Pt has been experimenting with foods off plan and making meals herself.  Subjective:   1. Type 2 diabetes mellitus with other specified complication, without long-term current use of insulin (HCC) Dx bout 2 years ago. Pt is not on medication currently. She is open to medication.  2. Anxiety and depression Pt is on Paxil and Wellbutrin. She has tried to get off Paxil but had significant rebound.  3. At risk for side effect of medication Laurie Mckinney is at risk for side effects of medication due to starting Ozempic.  Assessment/Plan:   1. Type 2 diabetes mellitus with other specified complication, without long-term current use of insulin (HCC) Good blood sugar control is important to decrease the likelihood of diabetic complications such as nephropathy, neuropathy, limb loss, blindness, coronary artery disease, and death. Intensive lifestyle modification including diet, exercise and weight loss are the first line of treatment for diabetes. Start Ozempic 0.25 mg, as prescribed below.  -  Semaglutide,0.25 or 0.5MG /DOS, (OZEMPIC, 0.25 OR 0.5 MG/DOSE,) 2 MG/1.5ML SOPN; Inject 0.25 mg into the skin once a week.  Dispense: 1.5 mL; Refill: 0  2. Anxiety and depression Behavior modification techniques were discussed today to help Laurie Mckinney deal with her anxiety.  Orders and follow up as documented in patient record. Consider cross taper off Paxil to more easily titratable SSRI like Zoloft or Prozac. Pt understands to not make any changes alone, but follow up with Laurie Mckinney to further discuss.  3. At risk for side effect of medication Laurie Mckinney was given approximately 15 minutes of drug side effect counseling today.  We discussed side effect possibility and risk versus benefits. Laurie Mckinney agreed to the medication and will contact this office if these side effects are intolerable.  Repetitive spaced learning was employed today to elicit superior memory formation and behavioral change.  4. Class 2 severe obesity with serious comorbidity and body mass index (BMI) of 37.0 to 37.9 in adult, unspecified obesity type [facility]) Laurie Mckinney is currently in the action stage of change. As such, her goal is to continue with weight loss efforts. She has agreed to the Category 3 Plan and keeping a food journal and adhering to recommended goals of 350-500 calories and 30+ g protein with lunch.   Exercise goals: No exercise has been prescribed at this time.  Behavioral modification strategies: increasing lean protein intake, no skipping meals, meal planning and cooking strategies, keeping healthy foods in the home and planning for success.  Laurie Mckinney has agreed to follow-up with our clinic in 2-3 weeks with Laurie Mckinney. She was informed of the importance of frequent follow-up  visits to maximize her success with intensive lifestyle modifications for her multiple health conditions.   Objective:   Blood pressure 117/72, pulse 60, temperature 98.1 F (36.7 C), height 5\' 11"  (1.803 m), weight 249 lb (112.9 kg), last menstrual  period 01/26/2011, SpO2 98 %. Body mass index is 34.73 kg/m.  General: Cooperative, alert, well developed, in no acute distress. HEENT: Conjunctivae and lids unremarkable. Cardiovascular: Regular rhythm.  Lungs: Normal work of breathing. Neurologic: No focal deficits.   Lab Results  Component Value Date   CREATININE 0.78 11/14/2020   BUN 19 11/14/2020   NA 140 11/14/2020   K 4.6 11/14/2020   CL 98 11/14/2020   CO2 21 11/14/2020   Lab Results  Component Value Date   ALT 22 11/14/2020   AST 35 11/14/2020   ALKPHOS 96 11/14/2020   BILITOT 0.4 11/14/2020   Lab Results  Component Value Date   HGBA1C 6.7 (H) 11/14/2020   HGBA1C 6.5 (A) 01/15/2020   HGBA1C 6.9 (A) 10/04/2019   HGBA1C 6.6 (H) 05/23/2019   HGBA1C 6.8 (H) 04/14/2019   Lab Results  Component Value Date   INSULIN 4.0 11/14/2020   Lab Results  Component Value Date   TSH 0.724 11/14/2020   Lab Results  Component Value Date   CHOL 135 11/14/2020   HDL 62 11/14/2020   LDLCALC 62 11/14/2020   TRIG 45 11/14/2020   CHOLHDL 2.6 10/05/2019   Lab Results  Component Value Date   WBC 7.8 11/14/2020   HGB 14.5 11/14/2020   HCT 43.2 11/14/2020   MCV 92 11/14/2020   PLT 289 11/14/2020    Attestation Statements:   Reviewed by clinician on day of visit: allergies, medications, problem list, medical history, surgical history, family history, social history, and previous encounter notes.  Coral Ceo, am acting as transcriptionist for Coralie Common, MD.   I have reviewed the above documentation for accuracy and completeness, and I agree with the above. - Jinny Blossom, MD

## 2021-02-10 ENCOUNTER — Encounter: Payer: Self-pay | Admitting: Family Medicine

## 2021-02-10 ENCOUNTER — Other Ambulatory Visit: Payer: Self-pay

## 2021-02-10 ENCOUNTER — Ambulatory Visit (INDEPENDENT_AMBULATORY_CARE_PROVIDER_SITE_OTHER): Payer: BC Managed Care – PPO | Admitting: Family Medicine

## 2021-02-10 VITALS — BP 118/76 | HR 58 | Temp 97.1°F | Ht 71.5 in | Wt 252.6 lb

## 2021-02-10 DIAGNOSIS — Z8669 Personal history of other diseases of the nervous system and sense organs: Secondary | ICD-10-CM | POA: Diagnosis not present

## 2021-02-10 DIAGNOSIS — E1159 Type 2 diabetes mellitus with other circulatory complications: Secondary | ICD-10-CM

## 2021-02-10 DIAGNOSIS — E1142 Type 2 diabetes mellitus with diabetic polyneuropathy: Secondary | ICD-10-CM

## 2021-02-10 DIAGNOSIS — I152 Hypertension secondary to endocrine disorders: Secondary | ICD-10-CM

## 2021-02-10 DIAGNOSIS — M545 Low back pain, unspecified: Secondary | ICD-10-CM

## 2021-02-10 DIAGNOSIS — Z8542 Personal history of malignant neoplasm of other parts of uterus: Secondary | ICD-10-CM

## 2021-02-10 DIAGNOSIS — F341 Dysthymic disorder: Secondary | ICD-10-CM

## 2021-02-10 DIAGNOSIS — I441 Atrioventricular block, second degree: Secondary | ICD-10-CM

## 2021-02-10 DIAGNOSIS — Z90710 Acquired absence of both cervix and uterus: Secondary | ICD-10-CM

## 2021-02-10 DIAGNOSIS — E1140 Type 2 diabetes mellitus with diabetic neuropathy, unspecified: Secondary | ICD-10-CM

## 2021-02-10 DIAGNOSIS — Z0000 Encounter for general adult medical examination without abnormal findings: Secondary | ICD-10-CM | POA: Diagnosis not present

## 2021-02-10 DIAGNOSIS — G4733 Obstructive sleep apnea (adult) (pediatric): Secondary | ICD-10-CM

## 2021-02-10 DIAGNOSIS — D6869 Other thrombophilia: Secondary | ICD-10-CM

## 2021-02-10 DIAGNOSIS — E1169 Type 2 diabetes mellitus with other specified complication: Secondary | ICD-10-CM

## 2021-02-10 DIAGNOSIS — Z6836 Body mass index (BMI) 36.0-36.9, adult: Secondary | ICD-10-CM

## 2021-02-10 DIAGNOSIS — I4819 Other persistent atrial fibrillation: Secondary | ICD-10-CM

## 2021-02-10 DIAGNOSIS — Z23 Encounter for immunization: Secondary | ICD-10-CM

## 2021-02-10 DIAGNOSIS — E669 Obesity, unspecified: Secondary | ICD-10-CM

## 2021-02-10 DIAGNOSIS — E785 Hyperlipidemia, unspecified: Secondary | ICD-10-CM

## 2021-02-10 DIAGNOSIS — J301 Allergic rhinitis due to pollen: Secondary | ICD-10-CM

## 2021-02-10 DIAGNOSIS — N393 Stress incontinence (female) (male): Secondary | ICD-10-CM

## 2021-02-10 DIAGNOSIS — G8929 Other chronic pain: Secondary | ICD-10-CM

## 2021-02-10 LAB — POCT UA - MICROALBUMIN
Albumin/Creatinine Ratio, Urine, POC: <4.9
Creatinine, POC: 103 mg/dL
Microalbumin Ur, POC: <5 mg/L

## 2021-02-10 LAB — POCT GLYCOSYLATED HEMOGLOBIN (HGB A1C): Hemoglobin A1C: 6.4 % — AB (ref 4.0–5.6)

## 2021-02-10 NOTE — Progress Notes (Signed)
   Subjective:    Patient ID: Laurie Mckinney, female    DOB: 01-15-1961, 60 y.o.   MRN: [id number]  HPI She is here for complete examination.  Since last being seen she had many issues identified and treated.  She had a history of atrial fib flutter and has had an ablation.  She is followed by cardiology for that.  She is presently on Xarelto.  EKG does show Mobitz type I as well.  She has a history of OSA and presently is on CPAP and doing quite nicely on that.  She is involved in medical weight loss and wellness.  She has lost roughly 30 pounds overall and presently is taking Ozempic.  She does have continued difficulty with neuropathy and presently is on Neurontin 100 mg nightly and is planning on seeing neurology in the near future concerning that.  She continues on Paxil and Wellbutrin and is doing well psychologically on it.  She is retired which is helped a lot.  She continues on lisinopril/HCTZ and is having no difficulty with that.  He does have a history of hysterectomy for treatment of endometrial cancer.  Her allergies seem to be under good control with OTC meds.  She does have stress incontinence and seems to be handling this fairly well in the present circumstances.  She has not had any difficulty with migraine headaches in quite some time.  Her back pain seems to be under good control with conservative measures.  Otherwise she has no particular concerns or complaints.  Family and social history as well as health maintenance and immunizations was reviewed   Review of Systems  All other systems reviewed and are negative.      Objective:   Physical Exam Alert and in no distress. Tympanic membranes and canals are normal. Pharyngeal area is normal. Neck is supple without adenopathy or thyromegaly. Cardiac exam shows a regular sinus rhythm without murmurs or gallops. Lungs are clear to auscultation.        Assessment & Plan:  Need for viral immunization - Plan: Moderna Covid-19  Booster  History of endometrial cancer  Hypertension associated with diabetes ([place])  History of migraine headaches  Allergic rhinitis due to pollen, unspecified seasonality  Obesity (BMI 30-39.9)  Dysthymia  Second degree heart block  Type 2 diabetes mellitus with diabetic neuropathy, without long-term current use of insulin ([place]) - Plan: POCT glycosylated hemoglobin (Hb A1C), POCT UA - Microalbumin  Chronic bilateral low back pain without sciatica  Persistent atrial fibrillation (HCC)  Diabetic polyneuropathy associated with type 2 diabetes mellitus ([place])  History of hysterectomy for cancer  Class 2 severe obesity with serious comorbidity and body mass index (BMI) of 36.0 to 36.9 in adult, unspecified obesity type ([place])  Hyperlipidemia associated with type 2 diabetes mellitus ([place])  Secondary hypercoagulable state ([place])  Female stress incontinence  OSA (obstructive sleep apnea)  Need for pneumococcal vaccination - Plan: Pneumococcal polysaccharide vaccine 23-valent greater than or equal to 2yo subcutaneous/IM Overall she seems to be doing quite nicely.  She will continue to be followed by neurology, medical weight loss and wellness and cardiology.  Continue on present medications although the Neurontin might need to be readjusted.  She is to set up for a mammogram and to get an eye appointment.

## 2021-02-11 ENCOUNTER — Encounter: Payer: Self-pay | Admitting: Adult Health

## 2021-02-11 MED ORDER — GABAPENTIN 100 MG PO CAPS: 200.0000 mg | ORAL_CAPSULE | Freq: Every evening | ORAL | 1 refills | Status: DC | PRN

## 2021-02-11 NOTE — Telephone Encounter (Signed)
Ok to do

## 2021-02-12 ENCOUNTER — Other Ambulatory Visit: Payer: Self-pay

## 2021-02-12 ENCOUNTER — Ambulatory Visit (INDEPENDENT_AMBULATORY_CARE_PROVIDER_SITE_OTHER): Payer: BC Managed Care – PPO | Admitting: Adult Health

## 2021-02-12 ENCOUNTER — Encounter (INDEPENDENT_AMBULATORY_CARE_PROVIDER_SITE_OTHER): Payer: Self-pay | Admitting: Adult Health

## 2021-02-12 VITALS — BP 123/73 | HR 66 | Temp 98.1°F | Ht 71.0 in | Wt 249.0 lb

## 2021-02-12 DIAGNOSIS — F419 Anxiety disorder, unspecified: Secondary | ICD-10-CM | POA: Diagnosis not present

## 2021-02-12 DIAGNOSIS — E1140 Type 2 diabetes mellitus with diabetic neuropathy, unspecified: Secondary | ICD-10-CM | POA: Diagnosis not present

## 2021-02-12 DIAGNOSIS — F32A Depression, unspecified: Secondary | ICD-10-CM | POA: Diagnosis not present

## 2021-02-12 DIAGNOSIS — Z6837 Body mass index (BMI) 37.0-37.9, adult: Secondary | ICD-10-CM

## 2021-02-12 NOTE — Progress Notes (Signed)
Chief Complaint:   OBESITY Laurie Mckinney is here to discuss her progress with her obesity treatment plan along with follow-up of her obesity related diagnoses. Laurie Mckinney is on the Category 3 Plan and keeping a food journal and adhering to recommended goals of 350-500 calories and 30+ g protein and states she is following her eating plan approximately 50% of the time. Laurie Mckinney states she is not currently exercising.  Today's visit was #: 7 Starting weight: 268 lbs Starting date: 11/14/2020 Today's weight: 249 lbs Today's date: 02/12/2021 Total lbs lost to date: 19 lbs Total lbs lost since last in-office visit: 0  Interim History: Laurie Mckinney was started on Ozempic 0.25 mg once a week on 01/29/2021. She has had 2 doses and tolerating it well.  She had her annual physical with PCP on 02/10/2021. No change in prescriptions. She received 2 vaccines.   Subjective:   1. Type 2 diabetes mellitus with diabetic neuropathy, without long-term current use of insulin ([place]) Laurie Mckinney was diagnosed approximately 2020 with T2D.  01/29/21-she started on Ozempic 0.25 mg once a week and has had 2 doses.  She denies nausea or change in bowel habits.  02/10/2021 A1c 6.4- great!  Lab Results  Component Value Date   HGBA1C 6.4 (A) 02/10/2021   HGBA1C 6.7 (H) 11/14/2020   HGBA1C 6.5 (A) 01/15/2020   Lab Results  Component Value Date   MICROALBUR <5.0 02/10/2021   LDLCALC 62 11/14/2020   CREATININE 0.78 11/14/2020   Lab Results  Component Value Date   INSULIN 4.0 11/14/2020   2. Anxiety and depression Laurie Mckinney is on Bupropion SR 100 mg QD and paroxetine 20 mg (been on SSRI for 10-15 years). Pt denies suicidal or homicidal ideations. She reports stable mood. She also discussed medication management with PCP on 02/10/2021.  She would like to continue current Rx regimen.  Assessment/Plan:   1. Type 2 diabetes mellitus with diabetic neuropathy, without long-term current use of insulin (HCC) Good blood sugar  control is important to decrease the likelihood of diabetic complications such as nephropathy, neuropathy, limb loss, blindness, coronary artery disease, and death. Intensive lifestyle modification including diet, exercise and weight loss are the first line of treatment for diabetes. Continue Ozempic 0.25 mg. No refill needed today.  2. Anxiety and depression Behavior modification techniques were discussed today to help Laurie Mckinney deal with her anxiety.  Orders and follow up as documented in patient record. Continue current medication regimen.  3. Obesity with current BMI 34.7  Laurie Mckinney is currently in the action stage of change. As such, her goal is to continue with weight loss efforts. She has agreed to the Category 3 Plan.   1. Drink more water. 2. Increase daily walking.  Exercise goals: Increase daily activity and walk 5 minutes/day.  Behavioral modification strategies: increasing lean protein intake, decreasing simple carbohydrates, increasing water intake, meal planning and cooking strategies and planning for success.  Samayah has agreed to follow-up with our clinic in 2 weeks. She was informed of the importance of frequent follow-up visits to maximize her success with intensive lifestyle modifications for her multiple health conditions.   Objective:   Blood pressure 123/73, pulse 66, temperature 98.1 F (36.7 C), height 5\' 11"  (1.803 m), weight 249 lb (112.9 kg), last menstrual period 01/26/2011, SpO2 96 %. Body mass index is 34.73 kg/m.  General: Cooperative, alert, well developed, in no acute distress. HEENT: Conjunctivae and lids unremarkable. Cardiovascular: Regular rhythm.  Lungs: Normal work of breathing. Neurologic: No focal deficits.  Lab Results  Component Value Date   CREATININE 0.78 11/14/2020   BUN 19 11/14/2020   NA 140 11/14/2020   K 4.6 11/14/2020   CL 98 11/14/2020   CO2 21 11/14/2020   Lab Results  Component Value Date   ALT 22 11/14/2020   AST 35  11/14/2020   ALKPHOS 96 11/14/2020   BILITOT 0.4 11/14/2020   Lab Results  Component Value Date   HGBA1C 6.4 (A) 02/10/2021   HGBA1C 6.7 (H) 11/14/2020   HGBA1C 6.5 (A) 01/15/2020   HGBA1C 6.9 (A) 10/04/2019   HGBA1C 6.6 (H) 05/23/2019   Lab Results  Component Value Date   INSULIN 4.0 11/14/2020   Lab Results  Component Value Date   TSH 0.724 11/14/2020   Lab Results  Component Value Date   CHOL 135 11/14/2020   HDL 62 11/14/2020   LDLCALC 62 11/14/2020   TRIG 45 11/14/2020   CHOLHDL 2.6 10/05/2019   Lab Results  Component Value Date   WBC 7.8 11/14/2020   HGB 14.5 11/14/2020   HCT 43.2 11/14/2020   MCV 92 11/14/2020   PLT 289 11/14/2020    Attestation Statements:   Reviewed by clinician on day of visit: allergies, medications, problem list, medical history, surgical history, family history, social history, and previous encounter notes.  Time spent on visit including pre-visit chart review and post-visit care and charting was 32 minutes.   Coral Ceo, CMA, am acting as transcriptionist for Mina Marble, NP.  I have reviewed the above documentation for accuracy and completeness, and I agree with the above. -  Jontay Maston d. Domonique Cothran, NP-C

## 2021-02-23 ENCOUNTER — Other Ambulatory Visit: Payer: Self-pay | Admitting: Family Medicine

## 2021-02-24 ENCOUNTER — Ambulatory Visit: Payer: BC Managed Care – PPO | Admitting: Cardiology

## 2021-02-24 ENCOUNTER — Encounter: Payer: Self-pay | Admitting: Cardiology

## 2021-02-24 ENCOUNTER — Other Ambulatory Visit: Payer: Self-pay

## 2021-02-24 VITALS — BP 116/82 | HR 48 | Ht 71.0 in | Wt 251.0 lb

## 2021-02-24 DIAGNOSIS — I4819 Other persistent atrial fibrillation: Secondary | ICD-10-CM

## 2021-02-24 NOTE — Progress Notes (Signed)
Electrophysiology Office Note   Date:  02/24/2021   ID:  Laurie, Mckinney 01/15/1961, MRN [id number]  PCP:  Denita Lung, MD  Cardiologist:  Harl Bowie Primary Electrophysiologist: Gaye Alken, MD    Chief Complaint: AF   History of Present Illness: Laurie Mckinney is a 60 y.o. female who is being seen today for the evaluation of AF at the request of Cristopher Peru. Presenting today for electrophysiology evaluation.  She has a history of atrial fibrillation, atrial flutter, hypertension, Mobitz 1 AV block, and obstructive sleep apnea on CPAP.  She is status post A. fib ablation 10/27/2019.  Today, denies symptoms of palpitations, chest pain, shortness of breath, orthopnea, PND, lower extremity edema, claudication, dizziness, presyncope, syncope, bleeding, or neurologic sequela. The patient is tolerating medications without difficulties.  She has been doing well over the past few months.  She has noted no episodes of atrial fibrillation.  She continues to lose weight and has been going to the weight loss clinic at [facility].  She has been walking the trails around her house, and has been eating quite a bit healthier.  She is also been doing more work outside to get some cardiovascular exercise.  Past Medical History:  Diagnosis Date  . Adjustment insomnia    SHIFT WORK  . Allergy    RHINITIS  . Asthma   . Atrial flutter [facility]) 02/26/15   [facility] [place]  . Back pain   . Bursitis    right hip  . Cataract    see last eye eexam note   . Constipation   . Depression   . Diabetes mellitus without complication ([place])   . History of atrial fibrillation   . History of cancer   . Hypertension   . Incontinence   . Joint pain   . Migraine headache   . Mobitz type 2 second degree heart block   . Neuropathy   . Obesity   . Other fatigue   . Shortness of breath on exertion   . Sleep apnea    Past Surgical History:  Procedure Laterality Date  . ABDOMINAL  HYSTERECTOMY    . ATRIAL FIBRILLATION ABLATION N/A 10/27/2019   Procedure: ATRIAL FIBRILLATION ABLATION;  Surgeon: Constance Haw, MD;  Location: [place] [facility];  Service: Cardiovascular;  Laterality: N/A;  . CARDIOVERSION N/A 05/25/2019   Procedure: CARDIOVERSION;  Surgeon: Arnoldo Lenis, MD;  Location: [facility];  Service: Endoscopy;  Laterality: N/A;  . COLONOSCOPY  2011   Dr.Brodie  . ROBOTIC ASSISTED TOTAL HYSTERECTOMY WITH BILATERAL SALPINGO OOPHERECTOMY Bilateral 02/05/2015   Procedure: ROBOTIC ASSISTED TOTAL LAPAROSCOPIC HYSTERECTOMY WITH BILATERAL SALPINGO OOPHORECTOMY;  Surgeon: Everitt Amber, MD;  Location: [facility];  Service: Gynecology;  Laterality: Bilateral;     Current Outpatient Medications  Medication Sig Dispense Refill  . acetaminophen (TYLENOL) 650 MG CR tablet Take 650 mg by mouth every 8 (eight) hours as needed for pain.    Marland Kitchen atorvastatin (LIPITOR) 10 MG tablet Take 1 tablet (10 mg total) by mouth daily. 90 tablet 1  . buPROPion (WELLBUTRIN SR) 100 MG 12 hr tablet TAKE 1 TABLET BY MOUTH EVERY DAY 90 tablet 0  . gabapentin (NEURONTIN) 100 MG capsule Take 2 capsules (200 mg total) by mouth at bedtime as needed. 60 capsule 1  . Lancets (ONETOUCH DELICA PLUS [id number]) MISC USE AND DISCARD 1 LANCET   DAILY 100 each 3  . lisinopril-hydrochlorothiazide (ZESTORETIC) 20-25 MG tablet TAKE 1 TABLET  DAILY (DOSE INCREASE) 30 tablet 0  . Misc Natural Products (IMMUNE FORMULA PO) Take 2 tablets by mouth daily. immuneti    . Multiple Vitamins-Minerals (CENTRUM SILVER 50+WOMEN) TABS Take 1 tablet by mouth daily.    . Omega-3 Fatty Acids (FISH OIL) 1200 MG CAPS Take 2 capsules by mouth at bedtime.    Glory Rosebush VERIO test strip USE AND DISCARD 1 TEST     STRIP AS NEEDED AS         INSTRUCTED 100 each 3  . oxybutynin (DITROPAN-XL) 10 MG 24 hr tablet TAKE 1 TABLET BY MOUTH EVERYDAY AT BEDTIME 30 tablet 1  . PARoxetine (PAXIL) 20 MG tablet Take 1 tablet (20 mg total) by mouth daily. 90  tablet 1  . rivaroxaban (XARELTO) 20 MG TABS tablet Take 20 mg by mouth daily with supper.    . Semaglutide,0.25 or 0.5MG /DOS, (OZEMPIC, 0.25 OR 0.5 MG/DOSE,) 2 MG/1.5ML SOPN Inject 0.25 mg into the skin once a week. 1.5 mL 0  . UNABLE TO FIND Take 1 tablet by mouth Nightly. Med Name: Theotis Burrow     No current facility-administered medications for this visit.    Allergies:   Patient has no known allergies.   Social History:  The patient  reports that she has never smoked. She has never used smokeless tobacco. She reports current alcohol use of about 1.0 standard drink of alcohol per week. She reports that she does not use drugs.   Family History:  The patient's family history includes Arthritis in her mother; Diabetes in her brother; Heart disease in her father; Hyperlipidemia in her father; Hypertension in her father; Neuropathy in her brother; Other in her brother; Sudden death in her father.   ROS:  Please see the history of present illness.   Otherwise, review of systems is positive for none.   All other systems are reviewed and negative.   PHYSICAL EXAM: VS:  BP 116/82   Pulse (!) 48   Ht 5\' 11"  (1.803 m)   Wt 251 lb (113.9 kg)   LMP 01/26/2011   BMI 35.01 kg/m  , BMI Body mass index is 35.01 kg/m. GEN: Well nourished, well developed, in no acute distress  HEENT: normal  Neck: no JVD, carotid bruits, or masses Cardiac: RRR; no murmurs, rubs, or gallops,no edema  Respiratory:  clear to auscultation bilaterally, normal work of breathing GI: soft, nontender, nondistended, + BS MS: no deformity or atrophy  Skin: warm and dry Neuro:  Strength and sensation are intact Psych: euthymic mood, full affect  EKG:  EKG is ordered today. Personal review of the ekg ordered shows sinus rhythm, Mobitz 1 AV block, rate 48  Recent Labs: 11/14/2020: ALT 22; BUN 19; Creatinine, Ser 0.78; Hemoglobin 14.5; Platelets 289; Potassium 4.6; Sodium 140; TSH 0.724    Lipid Panel     Component Value  Date/Time   CHOL 135 11/14/2020 0944   TRIG 45 11/14/2020 0944   HDL 62 11/14/2020 0944   CHOLHDL 2.6 10/05/2019 0940   CHOLHDL 2.5 01/21/2017 0921   VLDL 11 01/21/2017 0921   LDLCALC 62 11/14/2020 0944     Wt Readings from Last 3 Encounters:  02/24/21 251 lb (113.9 kg)  02/12/21 249 lb (112.9 kg)  02/10/21 252 lb 9.6 oz (114.6 kg)      Other studies Reviewed: Additional studies/ records that were reviewed today include: TTE 06/07/19  Review of the above records today demonstrates:   1. The left ventricle has normal systolic function,  with an ejection fraction of 55-60%. The cavity size was normal. Left ventricular diastolic Doppler parameters are indeterminate.  2. The right ventricle has normal systolic function. The cavity was normal. There is no increase in right ventricular wall thickness.  3. Left atrial size was mildly dilated.  4. No evidence of mitral valve stenosis.  5. The aortic valve has an indeterminate number of cusps. No stenosis of the aortic valve.  6. The aorta is normal unless otherwise noted.  7. The aortic root is normal in size and structure.  8. Pulmonary hypertension is indeterminant, inadequate TR jet.  9. The interatrial septum was not well visualized.   ASSESSMENT AND PLAN:  1.  Persistent atrial fibrillation: Status post ablation 10/27/2019.  Currently on Eliquis with a CHA2DS2-VASc of 3.  She is fortunately remained in sinus rhythm.  She is able do all of her daily activities.  She continues to exercise.  No changes.  2.  Morbid obesity: Diet and exercise encouraged.  She has been working with the weight loss clinic at [facility].  She is also up to 32 pounds since March.  She has no complaints.  She has been doing well with her weight loss.  3.  Mobitz 1 heart block: Continue to avoid AV nodal blockers.    4.  Obstructive sleep apnea: CPAP compliance encouraged   5.  Hypertension: Currently well controlled  Current medicines are reviewed at length  with the patient today.   The patient does not have concerns regarding her medicines.  The following changes were made today: None  Labs/ tests ordered today include:  Orders Placed This Encounter  Procedures  . EKG 12-Lead    Disposition:   FU with Zoe Creasman 6 months  Signed, Anallely Rosell Meredith Leeds, MD  02/24/2021 10:14 AM     [facility] [address] [place] [address] [phone] (office) [phone] (fax)

## 2021-02-24 NOTE — Patient Instructions (Signed)
Medication Instructions:  °Your physician recommends that you continue on your current medications as directed. Please refer to the Current Medication list given to you today. ° °*If you need a refill on your cardiac medications before your next appointment, please call your pharmacy* ° ° °Lab Work: °None ordered ° ° °Testing/Procedures: °None ordered ° ° °Follow-Up: °At [facility], you and your health needs are our priority.  As part of our continuing mission to provide you with exceptional heart care, we have created designated Provider Care Teams.  These Care Teams include your primary Cardiologist (physician) and Advanced Practice Providers (APPs -  Physician Assistants and Nurse Practitioners) who all work together to provide you with the care you need, when you need it. ° °Your next appointment:   °6 month(s) ° °The format for your next appointment:   °In Person ° °Provider:   °Will Camnitz, MD ° ° ° °Thank you for choosing [facility]!! ° ° °Rieley Hausman, RN °[phone] °  °

## 2021-02-26 ENCOUNTER — Other Ambulatory Visit (INDEPENDENT_AMBULATORY_CARE_PROVIDER_SITE_OTHER): Payer: Self-pay | Admitting: Family Medicine

## 2021-02-26 DIAGNOSIS — E1169 Type 2 diabetes mellitus with other specified complication: Secondary | ICD-10-CM

## 2021-02-27 ENCOUNTER — Other Ambulatory Visit: Payer: Self-pay

## 2021-02-27 ENCOUNTER — Encounter (INDEPENDENT_AMBULATORY_CARE_PROVIDER_SITE_OTHER): Payer: Self-pay | Admitting: Family Medicine

## 2021-02-27 ENCOUNTER — Ambulatory Visit (INDEPENDENT_AMBULATORY_CARE_PROVIDER_SITE_OTHER): Payer: BC Managed Care – PPO | Admitting: Family Medicine

## 2021-02-27 VITALS — BP 117/70 | HR 62 | Temp 97.6°F | Ht 71.0 in | Wt 244.0 lb

## 2021-02-27 DIAGNOSIS — Z9189 Other specified personal risk factors, not elsewhere classified: Secondary | ICD-10-CM | POA: Diagnosis not present

## 2021-02-27 DIAGNOSIS — E785 Hyperlipidemia, unspecified: Secondary | ICD-10-CM

## 2021-02-27 DIAGNOSIS — E1169 Type 2 diabetes mellitus with other specified complication: Secondary | ICD-10-CM

## 2021-02-27 DIAGNOSIS — E1140 Type 2 diabetes mellitus with diabetic neuropathy, unspecified: Secondary | ICD-10-CM

## 2021-02-27 DIAGNOSIS — Z6836 Body mass index (BMI) 36.0-36.9, adult: Secondary | ICD-10-CM | POA: Diagnosis not present

## 2021-02-27 MED ORDER — OZEMPIC (0.25 OR 0.5 MG/DOSE) 2 MG/1.5ML ~~LOC~~ SOPN: 0.5000 mg | PEN_INJECTOR | SUBCUTANEOUS | 0 refills | Status: DC

## 2021-02-28 ENCOUNTER — Other Ambulatory Visit: Payer: Self-pay

## 2021-02-28 ENCOUNTER — Ambulatory Visit
Admission: RE | Admit: 2021-02-28 | Discharge: 2021-02-28 | Disposition: A | Discharge status: Home / Self Care | Payer: BC Managed Care – PPO | Source: Ambulatory Visit | Attending: Emergency Medicine | Admitting: Emergency Medicine

## 2021-02-28 ENCOUNTER — Encounter: Payer: Self-pay | Admitting: Family Medicine

## 2021-02-28 VITALS — BP 124/77 | HR 69 | Temp 98.2°F | Resp 18

## 2021-02-28 DIAGNOSIS — N3001 Acute cystitis with hematuria: Secondary | ICD-10-CM | POA: Insufficient documentation

## 2021-02-28 LAB — POCT URINALYSIS DIP (MANUAL ENTRY)
Glucose, UA: NEGATIVE mg/dL
Ketones, POC UA: NEGATIVE mg/dL
Leukocytes, UA: NEGATIVE
Nitrite, UA: NEGATIVE
Protein Ur, POC: 100 mg/dL — AB
Spec Grav, UA: 1.015 (ref 1.010–1.025)
Urobilinogen, UA: 0.2 E.U./dL
pH, UA: 6 (ref 5.0–8.0)

## 2021-02-28 MED ORDER — PHENAZOPYRIDINE HCL 200 MG PO TABS: 200.0000 mg | ORAL_TABLET | Freq: Three times a day (TID) | ORAL | 0 refills | Status: DC

## 2021-02-28 MED ORDER — NITROFURANTOIN MONOHYD MACRO 100 MG PO CAPS: 100.0000 mg | ORAL_CAPSULE | Freq: Two times a day (BID) | ORAL | 0 refills | Status: DC

## 2021-02-28 NOTE — ED Triage Notes (Signed)
Pt here for dysuria and dark urine upon waking this am; pt sts wears pad for incontinence

## 2021-02-28 NOTE — ED Provider Notes (Signed)
[facility]   CC: Burning with urination  SUBJECTIVE:  Laurie Mckinney is a 60 y.o. female who complains of blood in urine with slight lower abdominal pressure x 2-3 days.  Patient denies a precipitating event, recent sexual encounter, excessive caffeine intake.  Does admit to wear incontinence pads. Denies pain at this time.  Denies alleviating factors.  Symptoms are made worse with urination.  Admits to similar symptoms in the past.  Denies fever, chills, nausea, vomiting, abdominal pain, flank pain, abnormal vaginal discharge or bleeding.    LMP: Patient's last menstrual period was 01/26/2011.  ROS: As in HPI.  All other pertinent ROS negative.     Past Medical History:  Diagnosis Date  . Adjustment insomnia    SHIFT WORK  . Allergy    RHINITIS  . Asthma   . Atrial flutter [facility]) 02/26/15   [facility] [place]  . Back pain   . Bursitis    right hip  . Cataract    see last eye eexam note   . Constipation   . Depression   . Diabetes mellitus without complication ([place])   . History of atrial fibrillation   . History of cancer   . Hypertension   . Incontinence   . Joint pain   . Migraine headache   . Mobitz type 2 second degree heart block   . Neuropathy   . Obesity   . Other fatigue   . Shortness of breath on exertion   . Sleep apnea    Past Surgical History:  Procedure Laterality Date  . ABDOMINAL HYSTERECTOMY    . ATRIAL FIBRILLATION ABLATION N/A 10/27/2019   Procedure: ATRIAL FIBRILLATION ABLATION;  Surgeon: Constance Haw, MD;  Location: [place] [facility];  Service: Cardiovascular;  Laterality: N/A;  . CARDIOVERSION N/A 05/25/2019   Procedure: CARDIOVERSION;  Surgeon: Arnoldo Lenis, MD;  Location: [facility];  Service: Endoscopy;  Laterality: N/A;  . COLONOSCOPY  2011   Dr.Brodie  . ROBOTIC ASSISTED TOTAL HYSTERECTOMY WITH BILATERAL SALPINGO OOPHERECTOMY Bilateral 02/05/2015   Procedure: ROBOTIC ASSISTED TOTAL LAPAROSCOPIC HYSTERECTOMY  WITH BILATERAL SALPINGO OOPHORECTOMY;  Surgeon: Everitt Amber, MD;  Location: [facility];  Service: Gynecology;  Laterality: Bilateral;   No Known Allergies No current facility-administered medications on file prior to encounter.   Current Outpatient Medications on File Prior to Encounter  Medication Sig Dispense Refill  . acetaminophen (TYLENOL) 650 MG CR tablet Take 650 mg by mouth every 8 (eight) hours as needed for pain.    Marland Kitchen atorvastatin (LIPITOR) 10 MG tablet TAKE 1 TABLET DAILY 90 tablet 1  . buPROPion (WELLBUTRIN SR) 100 MG 12 hr tablet TAKE 1 TABLET BY MOUTH EVERY DAY 90 tablet 0  . gabapentin (NEURONTIN) 100 MG capsule Take 2 capsules (200 mg total) by mouth at bedtime as needed. 60 capsule 1  . Lancets (ONETOUCH DELICA PLUS [id number]) MISC USE AND DISCARD 1 LANCET   DAILY 100 each 3  . lisinopril-hydrochlorothiazide (ZESTORETIC) 20-25 MG tablet TAKE 1 TABLET DAILY (DOSE INCREASE) 30 tablet 0  . Misc Natural Products (IMMUNE FORMULA PO) Take 2 tablets by mouth daily. immuneti    . Multiple Vitamins-Minerals (CENTRUM SILVER 50+WOMEN) TABS Take 1 tablet by mouth daily.    . Omega-3 Fatty Acids (FISH OIL) 1200 MG CAPS Take 2 capsules by mouth at bedtime.    Glory Rosebush VERIO test strip USE AND DISCARD 1 TEST     STRIP AS NEEDED AS  INSTRUCTED 100 each 3  . oxybutynin (DITROPAN-XL) 10 MG 24 hr tablet TAKE 1 TABLET BY MOUTH EVERYDAY AT BEDTIME 30 tablet 1  . PARoxetine (PAXIL) 20 MG tablet Take 1 tablet (20 mg total) by mouth daily. 90 tablet 1  . rivaroxaban (XARELTO) 20 MG TABS tablet Take 20 mg by mouth daily with supper.    . Semaglutide,0.25 or 0.5MG /DOS, (OZEMPIC, 0.25 OR 0.5 MG/DOSE,) 2 MG/1.5ML SOPN Inject 0.5 mg into the skin once a week. 1.5 mL 0  . UNABLE TO FIND Take 1 tablet by mouth Nightly. Med Name: Theotis Burrow     Social History   Socioeconomic History  . Marital status: Married    Spouse name: Arthelia Callicott  . Number of children: 2  . Years of education: Not on file  .  Highest education level: Not on file  Occupational History  . Occupation: Conservation officer, historic buildings for parent  Tobacco Use  . Smoking status: Never Smoker  . Smokeless tobacco: Never Used  Vaping Use  . Vaping Use: Never used  Substance and Sexual Activity  . Alcohol use: Yes    Alcohol/week: 1.0 standard drink    Types: 1 Standard drinks or equivalent per week  . Drug use: No  . Sexual activity: Yes    Partners: Male    Comment: Has had hysterectomy.  Other Topics Concern  . Not on file  Social History Narrative   Lives at home with husband   Right handed   Caffeine: 1 cup of coffee in the morning and <20 oz of soda per day   Social Determinants of Health   Financial Resource Strain: Not on file  Food Insecurity: Not on file  Transportation Needs: Not on file  Physical Activity: Not on file  Stress: Not on file  Social Connections: Not on file  Intimate Partner Violence: Not on file   Family History  Problem Relation Age of Onset  . Arthritis Mother   . Heart disease Father   . Hypertension Father   . Hyperlipidemia Father   . Sudden death Father   . Neuropathy Brother        both legs   . Diabetes Brother        no treatment that the pt knows of   . Other Brother        drank a 6 pack of beer daily x 30 years     OBJECTIVE:  Vitals:   02/28/21 1206  BP: 124/77  Pulse: 69  Resp: 18  Temp: 98.2 F (36.8 C)  TempSrc: Oral  SpO2: 96%   General appearance: AOx3 in no acute distress HEENT: NCAT.  Oropharynx clear.  Lungs: clear to auscultation bilaterally without adventitious breath sounds Heart: regular rate and rhythm.  Abdomen: soft; non-distended; no tenderness; bowel sounds present; no guarding Back: no CVA tenderness Extremities: no edema; symmetrical with no gross deformities Skin: warm and dry Neurologic: Ambulates from chair to exam table without difficulty Psychological: alert and cooperative; normal mood and affect  Labs Reviewed  POCT  URINALYSIS DIP (MANUAL ENTRY) - Abnormal; Notable for the following components:      Result Value   Color, UA red (*)    Bilirubin, UA small (*)    Blood, UA large (*)    Protein Ur, POC =100 (*)    All other components within normal limits  URINE CULTURE    ASSESSMENT & PLAN:  1. Acute cystitis with hematuria     Meds ordered this encounter  Medications  . nitrofurantoin, macrocrystal-monohydrate, (MACROBID) 100 MG capsule    Sig: Take 1 capsule (100 mg total) by mouth 2 (two) times daily.    Dispense:  10 capsule    Refill:  0    Order Specific Question:   Supervising Provider    Answer:   Raylene Everts [[id number]]  . phenazopyridine (PYRIDIUM) 200 MG tablet    Sig: Take 1 tablet (200 mg total) by mouth 3 (three) times daily.    Dispense:  6 tablet    Refill:  0    Order Specific Question:   Supervising Provider    Answer:   Raylene Everts [[id number]]   Urine concerning for UTI Urine culture sent.  We will call you with the results.   Push fluids and get plenty of rest.   Take antibiotic as directed and to completion Take pyridium as prescribed and as needed for symptomatic relief Follow up with PCP if symptoms persists Return here or go to ER if you have any new or worsening symptoms such as fever, worsening abdominal pain, nausea/vomiting, flank pain, etc...  Outlined signs and symptoms indicating need for more acute intervention. Patient verbalized understanding. After Visit Summary given.     Lestine Box, PA-C 02/28/21 1230

## 2021-02-28 NOTE — Discharge Instructions (Signed)
Urine concerning for UTI Urine culture sent.  We will call you with the results.   Push fluids and get plenty of rest.   Take antibiotic as directed and to completion Take pyridium as prescribed and as needed for symptomatic relief Follow up with PCP if symptoms persists Return here or go to ER if you have any new or worsening symptoms such as fever, worsening abdominal pain, nausea/vomiting, flank pain, etc..Marland Kitchen

## 2021-03-02 LAB — URINE CULTURE
Culture: <10000 — AB
Special Requests: NORMAL

## 2021-03-03 ENCOUNTER — Encounter: Payer: Self-pay | Admitting: Family Medicine

## 2021-03-04 NOTE — Telephone Encounter (Signed)
Pt called and wanted to see if you were able to look at her results from her urine and did you want her to follow up with you today or sometime this week

## 2021-03-05 ENCOUNTER — Encounter: Payer: Self-pay | Admitting: Family Medicine

## 2021-03-05 ENCOUNTER — Ambulatory Visit: Payer: BC Managed Care – PPO | Admitting: Family Medicine

## 2021-03-05 ENCOUNTER — Other Ambulatory Visit: Payer: Self-pay

## 2021-03-05 VITALS — BP 128/76 | HR 70 | Temp 97.9°F | Wt 248.4 lb

## 2021-03-05 DIAGNOSIS — N3001 Acute cystitis with hematuria: Secondary | ICD-10-CM | POA: Diagnosis not present

## 2021-03-05 LAB — POCT URINALYSIS DIP (PROADVANTAGE DEVICE)
Glucose, UA: NEGATIVE mg/dL
Nitrite, UA: POSITIVE — AB
Protein Ur, POC: >=300 mg/dL — AB
Specific Gravity, Urine: 1.025
Urobilinogen, Ur: 1
pH, UA: 5.5 (ref 5.0–8.0)

## 2021-03-05 MED ORDER — NITROFURANTOIN MONOHYD MACRO 100 MG PO CAPS: 100.0000 mg | ORAL_CAPSULE | Freq: Two times a day (BID) | ORAL | 0 refills | Status: DC

## 2021-03-05 NOTE — Progress Notes (Signed)
Chief Complaint:   OBESITY Laurie Mckinney is here to discuss her progress with her obesity treatment plan along with follow-up of her obesity related diagnoses. Laurie Mckinney is on the Category 3 Plan and states she is following her eating plan approximately 50% of the time. Laurie Mckinney states she walked 1 mile once and did strenuous yard work twice.  Today's visit was #: 8 Starting weight: 268 lbs Starting date: 11/14/2020 Today's weight: 244 lbs Today's date: 02/27/2021 Total lbs lost to date: 24 Total lbs lost since last in-office visit: 5  Interim History: Laurie Mckinney has had 2 occasions of exercise over the last 2 weeks. She is still experiencing some moments of wanting to overindulge. She finds that the control of these cravings are easier. Pt is wondering how to tone certain area where she is noticing sagging. She is doing nuts and mixed fruit and yasso bars for snacks. She occasionally skips lunch.  Subjective:   1. Type 2 diabetes mellitus with other specified complication, without long-term current use of insulin (HCC) Trameka is on Ozempic 0.25 mg. She has noticed some control with initiation of Ozempic. Recent A1c of 6.4.  2. Hyperlipidemia associated with type 2 diabetes mellitus (HCC) Laurie Mckinney is on Lipitor daily. She denies myalgias or transaminitis.  3. At risk for side effect of medication Laurie Mckinney is at risk for side effects of medication due to increasing Ozempic.  Assessment/Plan:   1. Type 2 diabetes mellitus with other specified complication, without long-term current use of insulin (HCC) Good blood sugar control is important to decrease the likelihood of diabetic complications such as nephropathy, neuropathy, limb loss, blindness, coronary artery disease, and death. Intensive lifestyle modification including diet, exercise and weight loss are the first line of treatment for diabetes.  -Increase Ozempic to 0.5 mg weekly, as prescribed below. - Semaglutide,0.25 or 0.5MG /DOS,  (OZEMPIC, 0.25 OR 0.5 MG/DOSE,) 2 MG/1.5ML SOPN; Inject 0.5 mg into the skin once a week.  Dispense: 1.5 mL; Refill: 0  2. Hyperlipidemia associated with type 2 diabetes mellitus ([place]) Cardiovascular risk and specific lipid/LDL goals reviewed.  We discussed several lifestyle modifications today and Laurie Mckinney will continue to work on diet, exercise and weight loss efforts. Orders and follow up as documented in patient record.  -Continue Lipitor.  Counseling Intensive lifestyle modifications are the first line treatment for this issue. Dietary changes: Increase soluble fiber. Decrease simple carbohydrates. Exercise changes: Moderate to vigorous-intensity aerobic activity 150 minutes per week if tolerated. Lipid-lowering medications: see documented in medical record.  3. At risk for side effect of medication Laurie Mckinney was given approximately 15 minutes of drug side effect counseling today.  We discussed side effect possibility and risk versus benefits. Pasha agreed to the medication and will contact this office if these side effects are intolerable.  Repetitive spaced learning was employed today to elicit superior memory formation and behavioral change.  4. Class 2 severe obesity with serious comorbidity and body mass index (BMI) of 36.0 to 36.9 in adult, unspecified obesity type Laurie Mckinney, Laurie Mckinney) Laurie Mckinney is currently in the action stage of change. As such, her goal is to continue with weight loss efforts. She has agreed to the Category 3 Plan.   Exercise goals:  As is  Behavioral modification strategies: increasing lean protein intake, no skipping meals, meal planning and cooking strategies, keeping healthy foods in the home and planning for success.  Laurie Mckinney has agreed to follow-up with our clinic in 3-4 weeks. She was informed of the importance of frequent follow-up visits to  maximize her success with intensive lifestyle modifications for her multiple health conditions.   Objective:   Blood  pressure 117/70, pulse 62, temperature 97.6 F (36.4 C), height 5\' 11"  (1.803 m), weight 244 lb (110.7 kg), last menstrual period 01/26/2011, SpO2 96 %. Body mass index is 34.03 kg/m.  General: Cooperative, alert, well developed, in no acute distress. HEENT: Conjunctivae and lids unremarkable. Cardiovascular: Regular rhythm.  Lungs: Normal work of breathing. Neurologic: No focal deficits.   Lab Results  Component Value Date   CREATININE 0.78 11/14/2020   BUN 19 11/14/2020   NA 140 11/14/2020   K 4.6 11/14/2020   CL 98 11/14/2020   CO2 21 11/14/2020   Lab Results  Component Value Date   ALT 22 11/14/2020   AST 35 11/14/2020   ALKPHOS 96 11/14/2020   BILITOT 0.4 11/14/2020   Lab Results  Component Value Date   HGBA1C 6.4 (A) 02/10/2021   HGBA1C 6.7 (H) 11/14/2020   HGBA1C 6.5 (A) 01/15/2020   HGBA1C 6.9 (A) 10/04/2019   HGBA1C 6.6 (H) 05/23/2019   Lab Results  Component Value Date   INSULIN 4.0 11/14/2020   Lab Results  Component Value Date   TSH 0.724 11/14/2020   Lab Results  Component Value Date   CHOL 135 11/14/2020   HDL 62 11/14/2020   LDLCALC 62 11/14/2020   TRIG 45 11/14/2020   CHOLHDL 2.6 10/05/2019   Lab Results  Component Value Date   WBC 7.8 11/14/2020   HGB 14.5 11/14/2020   HCT 43.2 11/14/2020   MCV 92 11/14/2020   PLT 289 11/14/2020    Attestation Statements:   Reviewed by clinician on day of visit: allergies, medications, problem list, medical history, surgical history, family history, social history, and previous encounter notes.  Coral Ceo, CMA, am acting as transcriptionist for Coralie Common, MD.   I have reviewed the above documentation for accuracy and completeness, and I agree with the above. - Jinny Blossom, MD

## 2021-03-05 NOTE — Progress Notes (Signed)
   Subjective:    Patient ID: Laurie Mckinney, female    DOB: 1961-05-19, 60 y.o.   MRN: [id number]  HPI She is here for consultation concerning hematuria.  She was seen May 27 and urgent care center because of the hematuria.  That record was reviewed.  They placed her on a 5-day course of Macrodantin and did a culture.  The culture came back negative and they called to stop the antibiotic.  She states that she did have 3 days worth of the medication and thought that it was helping with the urgency and frequency.  She is here for follow-up.   Review of Systems     Objective:   Physical Exam Alert and in no distress.  Urine dipstick did show 3+ blood and leukocytes as well as nitrite positive. The urgent care medical record was reviewed.       Assessment & Plan:  Acute cystitis with hematuria - Plan: nitrofurantoin, macrocrystal-monohydrate, (MACROBID) 100 MG capsule, POCT Urinalysis DIP (Proadvantage Device) I stated that in spite of the culture coming back negative, she does indeed have a UTI and explained the reasoning behind the nitrite positivity.  I will place her back on Macrobid.  She is to return here in 2 weeks for recheck on the urine.  Explained that if she still has difficulty with bladder and urine, we might need to get more aggressive.  She was comfortable with that.

## 2021-03-12 DIAGNOSIS — R319 Hematuria, unspecified: Secondary | ICD-10-CM | POA: Diagnosis not present

## 2021-03-12 DIAGNOSIS — C541 Malignant neoplasm of endometrium: Secondary | ICD-10-CM | POA: Diagnosis not present

## 2021-03-12 DIAGNOSIS — Z1389 Encounter for screening for other disorder: Secondary | ICD-10-CM | POA: Diagnosis not present

## 2021-03-12 DIAGNOSIS — Z1231 Encounter for screening mammogram for malignant neoplasm of breast: Secondary | ICD-10-CM | POA: Diagnosis not present

## 2021-03-12 DIAGNOSIS — Z130 Encounter for screening for diseases of the blood and blood-forming organs and certain disorders involving the immune mechanism: Secondary | ICD-10-CM | POA: Diagnosis not present

## 2021-03-12 DIAGNOSIS — Z01419 Encounter for gynecological examination (general) (routine) without abnormal findings: Secondary | ICD-10-CM | POA: Diagnosis not present

## 2021-03-12 DIAGNOSIS — Z1272 Encounter for screening for malignant neoplasm of vagina: Secondary | ICD-10-CM | POA: Diagnosis not present

## 2021-03-19 ENCOUNTER — Other Ambulatory Visit (INDEPENDENT_AMBULATORY_CARE_PROVIDER_SITE_OTHER): Payer: BC Managed Care – PPO

## 2021-03-19 ENCOUNTER — Telehealth: Payer: Self-pay

## 2021-03-19 DIAGNOSIS — N3001 Acute cystitis with hematuria: Secondary | ICD-10-CM

## 2021-03-19 LAB — POCT URINALYSIS DIP (PROADVANTAGE DEVICE)
Bilirubin, UA: NEGATIVE
Glucose, UA: NEGATIVE mg/dL
Ketones, POC UA: NEGATIVE mg/dL
Leukocytes, UA: NEGATIVE
Nitrite, UA: NEGATIVE
Specific Gravity, Urine: 1.015
Urobilinogen, Ur: 0.2
pH, UA: 6 (ref 5.0–8.0)

## 2021-03-19 NOTE — Telephone Encounter (Signed)
Pt came in for repeat U/A . Please advise of results. [place]

## 2021-03-24 ENCOUNTER — Other Ambulatory Visit (INDEPENDENT_AMBULATORY_CARE_PROVIDER_SITE_OTHER): Payer: Self-pay | Admitting: Family Medicine

## 2021-03-24 DIAGNOSIS — E1169 Type 2 diabetes mellitus with other specified complication: Secondary | ICD-10-CM

## 2021-03-24 NOTE — Telephone Encounter (Signed)
DR Ukleja 

## 2021-03-25 ENCOUNTER — Encounter (INDEPENDENT_AMBULATORY_CARE_PROVIDER_SITE_OTHER): Payer: Self-pay | Admitting: Adult Health

## 2021-03-25 ENCOUNTER — Other Ambulatory Visit: Payer: Self-pay

## 2021-03-25 ENCOUNTER — Ambulatory Visit (INDEPENDENT_AMBULATORY_CARE_PROVIDER_SITE_OTHER): Payer: BC Managed Care – PPO | Admitting: Adult Health

## 2021-03-25 VITALS — BP 108/72 | HR 62 | Temp 98.0°F | Ht 71.0 in | Wt 240.0 lb

## 2021-03-25 DIAGNOSIS — K5900 Constipation, unspecified: Secondary | ICD-10-CM | POA: Diagnosis not present

## 2021-03-25 DIAGNOSIS — E1159 Type 2 diabetes mellitus with other circulatory complications: Secondary | ICD-10-CM | POA: Diagnosis not present

## 2021-03-25 DIAGNOSIS — Z6837 Body mass index (BMI) 37.0-37.9, adult: Secondary | ICD-10-CM

## 2021-03-25 DIAGNOSIS — E1169 Type 2 diabetes mellitus with other specified complication: Secondary | ICD-10-CM | POA: Diagnosis not present

## 2021-03-25 DIAGNOSIS — I152 Hypertension secondary to endocrine disorders: Secondary | ICD-10-CM | POA: Diagnosis not present

## 2021-03-25 MED ORDER — OZEMPIC (0.25 OR 0.5 MG/DOSE) 2 MG/1.5ML ~~LOC~~ SOPN
0.5000 mg | PEN_INJECTOR | SUBCUTANEOUS | 0 refills | Status: DC
Start: 2021-03-25 — End: 2021-04-02

## 2021-03-25 NOTE — Progress Notes (Signed)
Chief Complaint:   OBESITY Laurie Mckinney is here to discuss her progress with her obesity treatment plan along with follow-up of her obesity related diagnoses. Laurie Mckinney is on the Category 3 Plan and states she is following her eating plan approximately 45% of the time. Laurie Mckinney states she is walking and doing yard work 60 minutes 2 times per week.  Today's visit was #: 9 Starting weight: 268 lbs Starting date: 11/14/2020 Today's weight: 240 lbs Today's date: 03/25/2021 Total lbs lost to date: 28 Total lbs lost since last in-office visit: 4  Interim History: Laurie Mckinney's Ozempic was increased to 0.5 mg on 02/27/2021- denies nausea, but experienced increase in constipation. She reports decline in cravings/polyphagia with increased GLP-1 dosage.  Subjective:   1. Type 2 diabetes mellitus with other specified complication, without long-term current use of insulin (Laurie Mckinney) Laurie Mckinney's 02/10/2021 A1c 6.4- at goal. Ozempic was increased to 0.5 mg at last OV on 02/27/2021.  She denies nausea, but experienced increased constipation.  Lab Results  Component Value Date   HGBA1C 6.4 (A) 02/10/2021   HGBA1C 6.7 (H) 11/14/2020   HGBA1C 6.5 (A) 01/15/2020   Lab Results  Component Value Date   MICROALBUR <5.0 02/10/2021   LDLCALC 62 11/14/2020   CREATININE 0.78 11/14/2020   Lab Results  Component Value Date   INSULIN 4.0 11/14/2020   2. Hypertension associated with diabetes (Laurie Mckinney) BP/HR superb at OV.  Laurie Mckinney is on Zestoretic 20-25 mg QD.  BP Readings from Last 3 Encounters:  03/25/21 108/72  03/05/21 128/76  02/28/21 124/77   3. Constipation, unspecified constipation type Laurie Mckinney reports worsening constipation with recent increase of Ozempic from 0.25 mg to 0.5 mg on 02/27/2021.  She is using OTC Miralax and OTC stool softeners.  Assessment/Plan:   1. Type 2 diabetes mellitus with other specified complication, without long-term current use of insulin (HCC) Good blood sugar control is  important to decrease the likelihood of diabetic complications such as nephropathy, neuropathy, limb loss, blindness, coronary artery disease, and death. Intensive lifestyle modification including diet, exercise and weight loss are the first line of treatment for diabetes.   - Semaglutide,0.25 or 0.5MG /DOS, (OZEMPIC, 0.25 OR 0.5 MG/DOSE,) 2 MG/1.5ML SOPN; Inject 0.5 mg into the skin once a week.  Dispense: 1.5 mL; Refill: 0  2. Hypertension associated with diabetes (Laurie Mckinney) [name] is working on healthy weight loss and exercise to improve blood pressure control. We will watch for signs of hypotension as she continues her lifestyle modifications. Continue Zestoretic 20-25 mg QD.  3. Constipation, unspecified constipation type Laurie Mckinney was informed that a decrease in bowel movement frequency is normal while losing weight, but stools should not be hard or painful. Orders and follow up as documented in patient record.  Continue Miralax, stool softener, and add in OTC Senna Tea  Counseling Getting to Good Bowel Health: Your goal is to have one soft bowel movement each day. Drink at least 8 glasses of water each day. Eat plenty of fiber (goal is over 25 grams each day). It is best to get most of your fiber from dietary sources which includes leafy green vegetables, fresh fruit, and whole grains. You may need to add fiber with the help of OTC fiber supplements. These include Metamucil, Citrucel, and Flaxseed. If you are still having trouble, try adding Miralax or Magnesium Citrate. If all of these changes do not work, Cabin crew.  4. Obesity with current BMI 33.6  Laurie Mckinney is currently in the action stage of change.  As such, her goal is to continue with weight loss efforts. She has agreed to the Category 3 Plan.   Exercise goals:  As is  Behavioral modification strategies: increasing lean protein intake, decreasing simple carbohydrates, meal planning and cooking strategies, keeping healthy  foods in the home, and planning for success.  Laurie Mckinney has agreed to follow-up with our clinic in 3 weeks. She was informed of the importance of frequent follow-up visits to maximize her success with intensive lifestyle modifications for her multiple health conditions.   Objective:   Blood pressure 108/72, pulse 62, temperature 98 F (36.7 C), height 5\' 11"  (1.803 m), weight 240 lb (108.9 kg), last menstrual period 01/26/2011, SpO2 98 %. Body mass index is 33.47 kg/m.  General: Cooperative, alert, well developed, in no acute distress. HEENT: Conjunctivae and lids unremarkable. Cardiovascular: Regular rhythm.  Lungs: Normal work of breathing. Neurologic: No focal deficits.   Lab Results  Component Value Date   CREATININE 0.78 11/14/2020   BUN 19 11/14/2020   NA 140 11/14/2020   K 4.6 11/14/2020   CL 98 11/14/2020   CO2 21 11/14/2020   Lab Results  Component Value Date   ALT 22 11/14/2020   AST 35 11/14/2020   ALKPHOS 96 11/14/2020   BILITOT 0.4 11/14/2020   Lab Results  Component Value Date   HGBA1C 6.4 (A) 02/10/2021   HGBA1C 6.7 (H) 11/14/2020   HGBA1C 6.5 (A) 01/15/2020   HGBA1C 6.9 (A) 10/04/2019   HGBA1C 6.6 (H) 05/23/2019   Lab Results  Component Value Date   INSULIN 4.0 11/14/2020   Lab Results  Component Value Date   TSH 0.724 11/14/2020   Lab Results  Component Value Date   CHOL 135 11/14/2020   HDL 62 11/14/2020   LDLCALC 62 11/14/2020   TRIG 45 11/14/2020   CHOLHDL 2.6 10/05/2019   Lab Results  Component Value Date   WBC 7.8 11/14/2020   HGB 14.5 11/14/2020   HCT 43.2 11/14/2020   MCV 92 11/14/2020   PLT 289 11/14/2020   No results found for: IRON, TIBC, FERRITIN  Obesity Behavioral Intervention:   Approximately 15 minutes were spent on the discussion below.  ASK: We discussed the diagnosis of obesity with Laurie Mckinney today and Laurie Mckinney agreed to give [place] permission to discuss obesity behavioral modification therapy  today.  ASSESS: Laurie Mckinney has the diagnosis of obesity and her BMI today is 33.6. Laurie Mckinney is in the action stage of change.   ADVISE: Laurie Mckinney was educated on the multiple health risks of obesity as well as the benefit of weight loss to improve her health. She was advised of the need for long term treatment and the importance of lifestyle modifications to improve her current health and to decrease her risk of future health problems.  AGREE: Multiple dietary modification options and treatment options were discussed and Laurie Mckinney agreed to follow the recommendations documented in the above note.  ARRANGE: Laurie Mckinney was educated on the importance of frequent visits to treat obesity as outlined per CMS and USPSTF guidelines and agreed to schedule her next follow up appointment today.  Attestation Statements:   Reviewed by clinician on day of visit: allergies, medications, problem list, medical history, surgical history, family history, social history, and previous encounter notes.  Coral Ceo, CMA, am acting as transcriptionist for Mina Marble, NP.  I have reviewed the above documentation for accuracy and completeness, and I agree with the above. -  Laster Appling d. Dilpreet Faires, NP-C

## 2021-03-30 ENCOUNTER — Other Ambulatory Visit (INDEPENDENT_AMBULATORY_CARE_PROVIDER_SITE_OTHER): Payer: Self-pay | Admitting: Adult Health

## 2021-03-30 DIAGNOSIS — E1169 Type 2 diabetes mellitus with other specified complication: Secondary | ICD-10-CM

## 2021-03-31 ENCOUNTER — Other Ambulatory Visit: Payer: Self-pay | Admitting: Family Medicine

## 2021-03-31 ENCOUNTER — Other Ambulatory Visit (INDEPENDENT_AMBULATORY_CARE_PROVIDER_SITE_OTHER): Payer: Self-pay | Admitting: Adult Health

## 2021-03-31 DIAGNOSIS — R32 Unspecified urinary incontinence: Secondary | ICD-10-CM

## 2021-03-31 DIAGNOSIS — E1169 Type 2 diabetes mellitus with other specified complication: Secondary | ICD-10-CM

## 2021-03-31 NOTE — Telephone Encounter (Signed)
Pt last seen by Katy Danford, FNP.  

## 2021-03-31 NOTE — Telephone Encounter (Signed)
Please advise 

## 2021-04-01 ENCOUNTER — Encounter (INDEPENDENT_AMBULATORY_CARE_PROVIDER_SITE_OTHER): Payer: Self-pay | Admitting: Adult Health

## 2021-04-01 NOTE — Telephone Encounter (Signed)
Pt last seen by Katy Danford, FNP.  

## 2021-04-02 ENCOUNTER — Other Ambulatory Visit (INDEPENDENT_AMBULATORY_CARE_PROVIDER_SITE_OTHER): Payer: Self-pay

## 2021-04-02 DIAGNOSIS — E1169 Type 2 diabetes mellitus with other specified complication: Secondary | ICD-10-CM

## 2021-04-02 MED ORDER — OZEMPIC (0.25 OR 0.5 MG/DOSE) 2 MG/1.5ML ~~LOC~~ SOPN: 0.5000 mg | PEN_INJECTOR | SUBCUTANEOUS | 0 refills | Status: DC

## 2021-04-02 NOTE — Telephone Encounter (Signed)
Please advise 

## 2021-04-15 ENCOUNTER — Other Ambulatory Visit: Payer: Self-pay | Admitting: Family Medicine

## 2021-04-15 ENCOUNTER — Encounter: Payer: Self-pay | Admitting: Adult Health

## 2021-04-15 DIAGNOSIS — I152 Hypertension secondary to endocrine disorders: Secondary | ICD-10-CM

## 2021-04-16 MED ORDER — GABAPENTIN 100 MG PO CAPS: 200.0000 mg | ORAL_CAPSULE | Freq: Every evening | ORAL | 1 refills | Status: DC | PRN

## 2021-04-17 DIAGNOSIS — N3944 Nocturnal enuresis: Secondary | ICD-10-CM | POA: Diagnosis not present

## 2021-04-17 DIAGNOSIS — N3941 Urge incontinence: Secondary | ICD-10-CM | POA: Diagnosis not present

## 2021-04-17 DIAGNOSIS — R8271 Bacteriuria: Secondary | ICD-10-CM | POA: Diagnosis not present

## 2021-04-17 DIAGNOSIS — R3121 Asymptomatic microscopic hematuria: Secondary | ICD-10-CM | POA: Diagnosis not present

## 2021-04-18 DIAGNOSIS — H35033 Hypertensive retinopathy, bilateral: Secondary | ICD-10-CM | POA: Diagnosis not present

## 2021-04-18 LAB — HM DIABETES EYE EXAM

## 2021-04-21 ENCOUNTER — Other Ambulatory Visit: Payer: Self-pay | Admitting: Family Medicine

## 2021-04-21 DIAGNOSIS — R32 Unspecified urinary incontinence: Secondary | ICD-10-CM

## 2021-04-28 ENCOUNTER — Ambulatory Visit (INDEPENDENT_AMBULATORY_CARE_PROVIDER_SITE_OTHER): Payer: BC Managed Care – PPO | Admitting: Adult Health

## 2021-04-28 ENCOUNTER — Other Ambulatory Visit: Payer: Self-pay

## 2021-04-28 VITALS — BP 125/71 | HR 65 | Temp 98.4°F | Ht 71.0 in | Wt 239.0 lb

## 2021-04-28 DIAGNOSIS — I152 Hypertension secondary to endocrine disorders: Secondary | ICD-10-CM

## 2021-04-28 DIAGNOSIS — Z9189 Other specified personal risk factors, not elsewhere classified: Secondary | ICD-10-CM | POA: Diagnosis not present

## 2021-04-28 DIAGNOSIS — E1159 Type 2 diabetes mellitus with other circulatory complications: Secondary | ICD-10-CM | POA: Diagnosis not present

## 2021-04-28 DIAGNOSIS — E1169 Type 2 diabetes mellitus with other specified complication: Secondary | ICD-10-CM

## 2021-04-28 DIAGNOSIS — Z6837 Body mass index (BMI) 37.0-37.9, adult: Secondary | ICD-10-CM

## 2021-04-28 MED ORDER — OZEMPIC (0.25 OR 0.5 MG/DOSE) 2 MG/1.5ML ~~LOC~~ SOPN: 0.5000 mg | PEN_INJECTOR | SUBCUTANEOUS | 0 refills | Status: DC

## 2021-04-30 DIAGNOSIS — K5730 Diverticulosis of large intestine without perforation or abscess without bleeding: Secondary | ICD-10-CM | POA: Diagnosis not present

## 2021-04-30 DIAGNOSIS — R310 Gross hematuria: Secondary | ICD-10-CM | POA: Diagnosis not present

## 2021-04-30 DIAGNOSIS — R3121 Asymptomatic microscopic hematuria: Secondary | ICD-10-CM | POA: Diagnosis not present

## 2021-04-30 DIAGNOSIS — Z8542 Personal history of malignant neoplasm of other parts of uterus: Secondary | ICD-10-CM | POA: Diagnosis not present

## 2021-04-30 DIAGNOSIS — M47816 Spondylosis without myelopathy or radiculopathy, lumbar region: Secondary | ICD-10-CM | POA: Diagnosis not present

## 2021-04-30 NOTE — Progress Notes (Signed)
Chief Complaint:   OBESITY Laurie Mckinney is here to discuss her progress with her obesity treatment plan along with follow-up of her obesity related diagnoses. Laurie Mckinney is on the Category 3 Plan and states she is following her eating plan approximately 35% of the time. Laurie Mckinney states she is not currently exercising.  Today's visit was #: 10 Starting weight: 268 lbs Starting date: 11/14/2020 Today's weight: 239 lbs Today's date: 04/28/2021 Total lbs lost to date: 29 Total lbs lost since last in-office visit: 1  Interim History: Since starting the program, Laurie Mckinney has been cooking cleaner and reduced portion size of meals.  Subjective:   1. Type 2 diabetes mellitus with other specified complication, without long-term current use of insulin (HCC) She is on Ozempic 0.5 mg once weekly. She continues to experience constipation (chronic condition prior to GLP-1).  2. Hypertension associated with diabetes ([place]) BP/HR excellent at OV.  Laurie Mckinney is on Zestoretic 20-25 mg QD. Pt denies acute cardiac symptoms.  3. At risk for heart disease Laurie Mckinney is at a higher than average risk for cardiovascular disease due to obesity and hypertension.  Assessment/Plan:   1. Type 2 diabetes mellitus with other specified complication, without long-term current use of insulin (HCC) Good blood sugar control is important to decrease the likelihood of diabetic complications such as nephropathy, neuropathy, limb loss, blindness, coronary artery disease, and death. Intensive lifestyle modification including diet, exercise and weight loss are the first line of treatment for diabetes.   Refill- Semaglutide,0.25 or 0.'5MG'$ /DOS, (OZEMPIC, 0.25 OR 0.5 MG/DOSE,) 2 MG/1.5ML SOPN; Inject 0.5 mg into the skin once a week.  Dispense: 4.5 mL; Refill: 0  2. Hypertension associated with diabetes ([place]) Darleen is working on healthy weight loss and exercise to improve blood pressure control. We will watch for signs of  hypotension as she continues her lifestyle modifications. Continue current anti-hypertensive regimen.  3. At risk for heart disease Adeena was given approximately 15 minutes of coronary artery disease prevention counseling today. She is 60 y.o. female and has risk factors for heart disease including obesity. We discussed intensive lifestyle modifications today with an emphasis on specific weight loss instructions and strategies.   Repetitive spaced learning was employed today to elicit superior memory formation and behavioral change.   4. Obesity with current BMI 33.4  Laurie Mckinney is currently in the action stage of change. As such, her goal is to continue with weight loss efforts. She has agreed to keeping a food journal and adhering to recommended goals of 1400-1500 calories and 100 grams protein.   Handouts: Recipe II Guide, Eating Out Guide Check fasting labs at next OV.  Exercise goals:  Increase daily walking to 10 minutes daily. Do banded exercises twice a week.  Behavioral modification strategies: increasing lean protein intake, decreasing simple carbohydrates, meal planning and cooking strategies, keeping healthy foods in the home, and planning for success.  Mardi has agreed to follow-up with our clinic in 2 weeks. She was informed of the importance of frequent follow-up visits to maximize her success with intensive lifestyle modifications for her multiple health conditions.   Objective:   Blood pressure 125/71, pulse 65, temperature 98.4 F (36.9 C), height '5\' 11"'$  (1.803 m), weight 239 lb (108.4 kg), last menstrual period 01/26/2011, SpO2 98 %. Body mass index is 33.33 kg/m.  General: Cooperative, alert, well developed, in no acute distress. HEENT: Conjunctivae and lids unremarkable. Cardiovascular: Regular rhythm.  Lungs: Normal work of breathing. Neurologic: No focal deficits.   Lab Results  Component Value Date   CREATININE 0.78 11/14/2020   BUN 19 11/14/2020   NA  140 11/14/2020   K 4.6 11/14/2020   CL 98 11/14/2020   CO2 21 11/14/2020   Lab Results  Component Value Date   ALT 22 11/14/2020   AST 35 11/14/2020   ALKPHOS 96 11/14/2020   BILITOT 0.4 11/14/2020   Lab Results  Component Value Date   HGBA1C 6.4 (A) 02/10/2021   HGBA1C 6.7 (H) 11/14/2020   HGBA1C 6.5 (A) 01/15/2020   HGBA1C 6.9 (A) 10/04/2019   HGBA1C 6.6 (H) 05/23/2019   Lab Results  Component Value Date   INSULIN 4.0 11/14/2020   Lab Results  Component Value Date   TSH 0.724 11/14/2020   Lab Results  Component Value Date   CHOL 135 11/14/2020   HDL 62 11/14/2020   LDLCALC 62 11/14/2020   TRIG 45 11/14/2020   CHOLHDL 2.6 10/05/2019   Lab Results  Component Value Date   VD25OH 40.5 11/14/2020   VD25OH 35 08/14/2011   Lab Results  Component Value Date   WBC 7.8 11/14/2020   HGB 14.5 11/14/2020   HCT 43.2 11/14/2020   MCV 92 11/14/2020   PLT 289 11/14/2020    Attestation Statements:   Reviewed by clinician on day of visit: allergies, medications, problem list, medical history, surgical history, family history, social history, and previous encounter notes.  Coral Ceo, CMA, am acting as transcriptionist for Mina Marble, NP.  I have reviewed the above documentation for accuracy and completeness, and I agree with the above. -  Judea Riches d. Jaidyn Kuhl, NP-C

## 2021-05-02 ENCOUNTER — Other Ambulatory Visit: Payer: Self-pay | Admitting: Family Medicine

## 2021-05-02 DIAGNOSIS — F341 Dysthymic disorder: Secondary | ICD-10-CM

## 2021-05-02 NOTE — Telephone Encounter (Signed)
Request for refill on the pts. Bupropion last apt was 03/05/21 and next apt is 06/16/21.

## 2021-05-07 ENCOUNTER — Encounter: Payer: Self-pay | Admitting: Family Medicine

## 2021-05-13 ENCOUNTER — Encounter (INDEPENDENT_AMBULATORY_CARE_PROVIDER_SITE_OTHER): Payer: Self-pay | Admitting: Adult Health

## 2021-05-13 ENCOUNTER — Ambulatory Visit (INDEPENDENT_AMBULATORY_CARE_PROVIDER_SITE_OTHER): Payer: BC Managed Care – PPO | Admitting: Adult Health

## 2021-05-13 ENCOUNTER — Other Ambulatory Visit: Payer: Self-pay

## 2021-05-13 VITALS — BP 102/68 | HR 66 | Temp 98.8°F | Ht 71.0 in | Wt 238.0 lb

## 2021-05-13 DIAGNOSIS — E1169 Type 2 diabetes mellitus with other specified complication: Secondary | ICD-10-CM

## 2021-05-13 DIAGNOSIS — E1159 Type 2 diabetes mellitus with other circulatory complications: Secondary | ICD-10-CM

## 2021-05-13 DIAGNOSIS — I152 Hypertension secondary to endocrine disorders: Secondary | ICD-10-CM | POA: Diagnosis not present

## 2021-05-13 DIAGNOSIS — Z9189 Other specified personal risk factors, not elsewhere classified: Secondary | ICD-10-CM

## 2021-05-13 DIAGNOSIS — Z6837 Body mass index (BMI) 37.0-37.9, adult: Secondary | ICD-10-CM

## 2021-05-13 DIAGNOSIS — E66812 Obesity, class 2: Secondary | ICD-10-CM

## 2021-05-14 LAB — COMPREHENSIVE METABOLIC PANEL
ALT: 14 IU/L (ref 0–32)
AST: 18 IU/L (ref 0–40)
Albumin/Globulin Ratio: 1.6 (ref 1.2–2.2)
Albumin: 4.2 g/dL (ref 3.8–4.8)
Alkaline Phosphatase: 92 IU/L (ref 44–121)
BUN/Creatinine Ratio: 24 (ref 12–28)
BUN: 17 mg/dL (ref 8–27)
Bilirubin Total: 0.5 mg/dL (ref 0.0–1.2)
CO2: 20 mmol/L (ref 20–29)
Calcium: 9.4 mg/dL (ref 8.7–10.3)
Chloride: 99 mmol/L (ref 96–106)
Creatinine, Ser: 0.72 mg/dL (ref 0.57–1.00)
Globulin, Total: 2.6 g/dL (ref 1.5–4.5)
Glucose: 177 mg/dL — ABNORMAL HIGH (ref 65–99)
Potassium: 4.8 mmol/L (ref 3.5–5.2)
Sodium: 138 mmol/L (ref 134–144)
Total Protein: 6.8 g/dL (ref 6.0–8.5)
eGFR: 94 mL/min/{1.73_m2} (ref >59)

## 2021-05-14 LAB — HEMOGLOBIN A1C
Est. average glucose Bld gHb Est-mCnc: 128 mg/dL
Hgb A1c MFr Bld: 6.1 % — ABNORMAL HIGH (ref 4.8–5.6)

## 2021-05-14 LAB — INSULIN, RANDOM: INSULIN: 9.7 u[IU]/mL (ref 2.6–24.9)

## 2021-05-14 NOTE — Progress Notes (Signed)
Chief Complaint:   OBESITY Laurie Mckinney is here to discuss her progress with her obesity treatment plan along with follow-up of her obesity related diagnoses. Laurie Mckinney is on keeping a food journal and adhering to recommended goals of 1400-1500 calories and 100 grams of protein and states she is following her eating plan approximately 50% of the time. Laurie Mckinney states she is walking for 10 minutes 2 times per week.  Today's visit was #: 11 Starting weight: 268 lbs Starting date: 11/14/2020 Today's weight: 238 lbs Today's date: 05/13/2021 Total lbs lost to date: 30 lbs Total lbs lost since last in-office visit: 1 lb  Interim History: Laurie Mckinney's mother fell and fractured her right hip last week.  This is the 5th fracture/surgery/rehab for her mother.  Mother is 12 - still lives independently.  Mother is now in rehab.  Of note:  She has been able to avoid overeating since starting this program.  Subjective:   1. Type 2 diabetes mellitus with other specified complication, without long-term current use of insulin (HCC) She is on Ozempic 0.5 mg once weekly - tolerating well.   She denies mass in neck, dysphagia, dyspepsia, or persistent hoarseness. On 02/10/2021, A1c was 6.4 - at goal.  Lab Results  Component Value Date   HGBA1C 6.1 (H) 05/13/2021   HGBA1C 6.4 (A) 02/10/2021   HGBA1C 6.7 (H) 11/14/2020   Lab Results  Component Value Date   MICROALBUR <5.0 02/10/2021   LDLCALC 62 11/14/2020   CREATININE 0.72 05/13/2021   Lab Results  Component Value Date   INSULIN WILL FOLLOW 05/13/2021   INSULIN 4.0 11/14/2020   2. Hypertension associated with diabetes ([place]) Blood pressure and heart rate excellent at office visit.  She is on lisinopril/HCTZ 20/25 mg daily.  BP Readings from Last 3 Encounters:  05/13/21 102/68  04/28/21 125/71  03/25/21 108/72   Assessment/Plan:   1. Type 2 diabetes mellitus with other specified complication, without long-term current use of insulin  (HCC) Continue Ozempic 0.5 mg once weekly.  No need for refill.  Check labs today.  - Hemoglobin A1c - Insulin, random  2. Hypertension associated with diabetes ([place]) Check labs today.  - Comprehensive metabolic panel  3. At Risk for Heart Disease Obesity, HTN, T2D  4. Obesity with current BMI 33.3  Laurie Mckinney is currently in the action stage of change. As such, her goal is to continue with weight loss efforts. She has agreed to keeping a food journal and adhering to recommended goals of 1400-1500 calories and 100 grams of protein.   Exercise goals:  As is.  Increase daily walking to 10 minutes per day.  Behavioral modification strategies: increasing lean protein intake, decreasing simple carbohydrates, meal planning and cooking strategies, keeping healthy foods in the home, planning for success, and keeping a strict food journal.  Laurie Mckinney has agreed to follow-up with our clinic in 2 weeks. She was informed of the importance of frequent follow-up visits to maximize her success with intensive lifestyle modifications for her multiple health conditions.   Laurie Mckinney was informed we would discuss her lab results at her next visit unless there is a critical issue that needs to be addressed sooner. Laurie Mckinney agreed to keep her next visit at the agreed upon time to discuss these results.  Objective:   Blood pressure 102/68, pulse 66, temperature 98.8 F (37.1 C), height '5\' 11"'$  (1.803 m), weight 238 lb (108 kg), last menstrual period 01/26/2011, SpO2 96 %. Body mass index is 33.19 kg/m.  General: Cooperative, alert, well developed, in no acute distress. HEENT: Conjunctivae and lids unremarkable. Cardiovascular: Regular rhythm.  Lungs: Normal work of breathing. Neurologic: No focal deficits.   Lab Results  Component Value Date   CREATININE 0.72 05/13/2021   BUN 17 05/13/2021   NA 138 05/13/2021   K 4.8 05/13/2021   CL 99 05/13/2021   CO2 20 05/13/2021   Lab Results  Component  Value Date   ALT 14 05/13/2021   AST 18 05/13/2021   ALKPHOS 92 05/13/2021   BILITOT 0.5 05/13/2021   Lab Results  Component Value Date   HGBA1C 6.1 (H) 05/13/2021   HGBA1C 6.4 (A) 02/10/2021   HGBA1C 6.7 (H) 11/14/2020   HGBA1C 6.5 (A) 01/15/2020   HGBA1C 6.9 (A) 10/04/2019   Lab Results  Component Value Date   INSULIN WILL FOLLOW 05/13/2021   INSULIN 4.0 11/14/2020   Lab Results  Component Value Date   TSH 0.724 11/14/2020   Lab Results  Component Value Date   CHOL 135 11/14/2020   HDL 62 11/14/2020   LDLCALC 62 11/14/2020   TRIG 45 11/14/2020   CHOLHDL 2.6 10/05/2019   Lab Results  Component Value Date   VD25OH 40.5 11/14/2020   VD25OH 35 08/14/2011   Lab Results  Component Value Date   WBC 7.8 11/14/2020   HGB 14.5 11/14/2020   HCT 43.2 11/14/2020   MCV 92 11/14/2020   PLT 289 11/14/2020   Attestation Statements:   Reviewed by clinician on day of visit: allergies, medications, problem list, medical history, surgical history, family history, social history, and previous encounter notes.  I, Water quality scientist, CMA, am acting as Location manager for Mina Marble, NP.  I have reviewed the above documentation for accuracy and completeness, and I agree with the above. -  Asaph Serena d. [place], NP-C

## 2021-05-21 DIAGNOSIS — N3946 Mixed incontinence: Secondary | ICD-10-CM | POA: Diagnosis not present

## 2021-05-21 DIAGNOSIS — R3121 Asymptomatic microscopic hematuria: Secondary | ICD-10-CM | POA: Diagnosis not present

## 2021-05-27 ENCOUNTER — Encounter (INDEPENDENT_AMBULATORY_CARE_PROVIDER_SITE_OTHER): Payer: Self-pay | Admitting: Adult Health

## 2021-05-27 ENCOUNTER — Encounter: Payer: Self-pay | Admitting: Family Medicine

## 2021-05-27 ENCOUNTER — Ambulatory Visit (INDEPENDENT_AMBULATORY_CARE_PROVIDER_SITE_OTHER): Payer: BC Managed Care – PPO | Admitting: Adult Health

## 2021-05-27 ENCOUNTER — Other Ambulatory Visit: Payer: Self-pay

## 2021-05-27 ENCOUNTER — Other Ambulatory Visit (INDEPENDENT_AMBULATORY_CARE_PROVIDER_SITE_OTHER): Payer: Self-pay | Admitting: Adult Health

## 2021-05-27 VITALS — BP 112/70 | HR 74 | Temp 97.9°F | Ht 71.0 in | Wt 236.0 lb

## 2021-05-27 DIAGNOSIS — Z6837 Body mass index (BMI) 37.0-37.9, adult: Secondary | ICD-10-CM

## 2021-05-27 DIAGNOSIS — I152 Hypertension secondary to endocrine disorders: Secondary | ICD-10-CM | POA: Diagnosis not present

## 2021-05-27 DIAGNOSIS — E1169 Type 2 diabetes mellitus with other specified complication: Secondary | ICD-10-CM | POA: Diagnosis not present

## 2021-05-27 DIAGNOSIS — E1159 Type 2 diabetes mellitus with other circulatory complications: Secondary | ICD-10-CM | POA: Diagnosis not present

## 2021-05-27 MED ORDER — OZEMPIC (0.25 OR 0.5 MG/DOSE) 2 MG/1.5ML ~~LOC~~ SOPN
1.0000 mg | PEN_INJECTOR | SUBCUTANEOUS | 0 refills | Status: DC
Start: 2021-05-27 — End: 2021-05-27

## 2021-05-27 NOTE — Telephone Encounter (Signed)
Please review order.

## 2021-05-27 NOTE — Progress Notes (Signed)
Chief Complaint:   OBESITY Laurie Mckinney is here to discuss her progress with her obesity treatment plan along with follow-up of her obesity related diagnoses. Laurie Mckinney is on keeping a food journal and adhering to recommended goals of 1400-1500 calories and 100 grams of protein and states she is following her eating plan approximately 50% of the time. Laurie Mckinney states she is not exercising regularly.  Today's visit was #: 12 Starting weight: 268 lbs Starting date: 11/14/2020 Today's weight: 236 lbs Today's date: 05/27/2021 Total lbs lost to date: 32 lbs Total lbs lost since last in-office visit: 2 lbs  Interim History: Laurie Mckinney prefers Category 3 due to the structure and foods on plan.  She is often challenged to meal prep/plan.   Discussed strategies to grocery shop and a plan for the week.    Internal goal:  Walking for 10 minutes 3 times per week.  Subjective:   1. Type 2 diabetes mellitus with other specified complication, without long-term current use of insulin (Laurie Mckinney) On 05/13/2021, BG 177 - quite elevated for a fasting level.  A1c - 6.1 - at goal.  Insulin level 9.7 - slightly elevated.  She is on Ozempic 0.5 mg.  Has had 4 doses.  Experiencing mild dyspepsia and constipation.  She denies dysphagia or persistent hoarseness.  Lab Results  Component Value Date   HGBA1C 6.1 (H) 05/13/2021   HGBA1C 6.4 (A) 02/10/2021   HGBA1C 6.7 (H) 11/14/2020   Lab Results  Component Value Date   MICROALBUR <5.0 02/10/2021   LDLCALC 62 11/14/2020   CREATININE 0.72 05/13/2021   Lab Results  Component Value Date   INSULIN 9.7 05/13/2021   INSULIN 4.0 11/14/2020   2. Hypertension associated with diabetes (Laurie Mckinney) On 05/13/2021, CMP was stable.  BP/HR excellent at office visit.  BP Readings from Last 3 Encounters:  05/27/21 112/70  05/13/21 102/68  04/28/21 125/71   Assessment/Plan:   1. Type 2 diabetes mellitus with other specified complication, without long-term current use of insulin  (HCC) Good blood sugar control is important to decrease the likelihood of diabetic complications such as nephropathy, neuropathy, limb loss, blindness, coronary artery disease, and death. Intensive lifestyle modification including diet, exercise and weight loss are the first line of treatment for diabetes.  Increase and refill semaglutide to 1 mg subcutaneously once weekly.  2. Hypertension associated with diabetes (Laurie Mckinney) Laurie Mckinney is working on healthy weight loss and exercise to improve blood pressure control. We will watch for signs of hypotension as she continues her lifestyle modifications.  Continue current antihypertensive therapy.   3. Obesity with current BMI 33.0  Laurie Mckinney is currently in the action stage of change. As such, her goal is to continue with weight loss efforts. She has agreed to the Category 3 Plan.   Exercise goals:  Walking for 10 minutes 3 times per week.  Behavioral modification strategies: increasing lean protein intake, decreasing simple carbohydrates, meal planning and cooking strategies, keeping healthy foods in the home, and planning for success.  Handouts:  High Protein/Low Carbohydrate List, Protein Content of Foods, Category 3 Meal Grocery List.  Laurie Mckinney has agreed to follow-up with our clinic in 3 weeks. She was informed of the importance of frequent follow-up visits to maximize her success with intensive lifestyle modifications for her multiple health conditions.   Objective:   Blood pressure 112/70, pulse 74, temperature 97.9 F (36.6 C), height '5\' 11"'$  (1.803 m), weight 236 lb (107 kg), last menstrual period 01/26/2011, SpO2 97 %. Body mass  index is 32.92 kg/m.  General: Cooperative, alert, well developed, in no acute distress. HEENT: Conjunctivae and lids unremarkable. Cardiovascular: Regular rhythm.  Lungs: Normal work of breathing. Neurologic: No focal deficits.   Lab Results  Component Value Date   CREATININE 0.72 05/13/2021   BUN 17  05/13/2021   NA 138 05/13/2021   K 4.8 05/13/2021   CL 99 05/13/2021   CO2 20 05/13/2021   Lab Results  Component Value Date   ALT 14 05/13/2021   AST 18 05/13/2021   ALKPHOS 92 05/13/2021   BILITOT 0.5 05/13/2021   Lab Results  Component Value Date   HGBA1C 6.1 (H) 05/13/2021   HGBA1C 6.4 (A) 02/10/2021   HGBA1C 6.7 (H) 11/14/2020   HGBA1C 6.5 (A) 01/15/2020   HGBA1C 6.9 (A) 10/04/2019   Lab Results  Component Value Date   INSULIN 9.7 05/13/2021   INSULIN 4.0 11/14/2020   Lab Results  Component Value Date   TSH 0.724 11/14/2020   Lab Results  Component Value Date   CHOL 135 11/14/2020   HDL 62 11/14/2020   LDLCALC 62 11/14/2020   TRIG 45 11/14/2020   CHOLHDL 2.6 10/05/2019   Lab Results  Component Value Date   VD25OH 40.5 11/14/2020   VD25OH 35 08/14/2011   Lab Results  Component Value Date   WBC 7.8 11/14/2020   HGB 14.5 11/14/2020   HCT 43.2 11/14/2020   MCV 92 11/14/2020   PLT 289 11/14/2020   Obesity Behavioral Intervention:   Approximately 15 minutes were spent on the discussion below.  ASK: We discussed the diagnosis of obesity with Laurie Mckinney today and Laurie Mckinney agreed to give [place] permission to discuss obesity behavioral modification therapy today.  ASSESS: Laurie Mckinney has the diagnosis of obesity and her BMI today is 33.0. Laurie Mckinney is in the action stage of change.   ADVISE: Laurie Mckinney was educated on the multiple health risks of obesity as well as the benefit of weight loss to improve her health. She was advised of the need for long term treatment and the importance of lifestyle modifications to improve her current health and to decrease her risk of future health problems.  AGREE: Multiple dietary modification options and treatment options were discussed and Laurie Mckinney agreed to follow the recommendations documented in the above note.  ARRANGE: Laurie Mckinney was educated on the importance of frequent visits to treat obesity as outlined per CMS and USPSTF  guidelines and agreed to schedule her next follow up appointment today.  Attestation Statements:   Reviewed by clinician on day of visit: allergies, medications, problem list, medical history, surgical history, family history, social history, and previous encounter notes.  I, Water quality scientist, CMA, am acting as Location manager for Mina Marble, NP.  I have reviewed the above documentation for accuracy and completeness, and I agree with the above. -  Angee Gupton d. Maansi Wike, NP-C

## 2021-06-04 ENCOUNTER — Encounter (INDEPENDENT_AMBULATORY_CARE_PROVIDER_SITE_OTHER): Payer: Self-pay

## 2021-06-04 ENCOUNTER — Other Ambulatory Visit (INDEPENDENT_AMBULATORY_CARE_PROVIDER_SITE_OTHER): Payer: Self-pay | Admitting: Adult Health

## 2021-06-04 DIAGNOSIS — E1169 Type 2 diabetes mellitus with other specified complication: Secondary | ICD-10-CM

## 2021-06-04 NOTE — Telephone Encounter (Signed)
Last OV with Katy 

## 2021-06-11 ENCOUNTER — Encounter (INDEPENDENT_AMBULATORY_CARE_PROVIDER_SITE_OTHER): Payer: Self-pay | Admitting: Adult Health

## 2021-06-11 ENCOUNTER — Ambulatory Visit (INDEPENDENT_AMBULATORY_CARE_PROVIDER_SITE_OTHER): Payer: BC Managed Care – PPO | Admitting: Adult Health

## 2021-06-11 ENCOUNTER — Other Ambulatory Visit: Payer: Self-pay

## 2021-06-11 VITALS — BP 115/72 | HR 59 | Temp 97.3°F | Ht 71.0 in | Wt 236.0 lb

## 2021-06-11 DIAGNOSIS — Z9189 Other specified personal risk factors, not elsewhere classified: Secondary | ICD-10-CM | POA: Diagnosis not present

## 2021-06-11 DIAGNOSIS — Z6837 Body mass index (BMI) 37.0-37.9, adult: Secondary | ICD-10-CM

## 2021-06-11 DIAGNOSIS — E1169 Type 2 diabetes mellitus with other specified complication: Secondary | ICD-10-CM | POA: Diagnosis not present

## 2021-06-11 DIAGNOSIS — I152 Hypertension secondary to endocrine disorders: Secondary | ICD-10-CM

## 2021-06-11 DIAGNOSIS — E1159 Type 2 diabetes mellitus with other circulatory complications: Secondary | ICD-10-CM | POA: Diagnosis not present

## 2021-06-11 MED ORDER — OZEMPIC (0.25 OR 0.5 MG/DOSE) 2 MG/1.5ML ~~LOC~~ SOPN: PEN_INJECTOR | SUBCUTANEOUS | 0 refills | Status: DC

## 2021-06-11 NOTE — Progress Notes (Signed)
Chief Complaint:   OBESITY Laurie Mckinney is here to discuss her progress with her obesity treatment plan along with follow-up of her obesity related diagnoses. Laurie Mckinney is on the Category 3 Plan and states she is following her eating plan approximately 50% of the time. [name] states she is not exercising at this time.  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   Her mother and husband are both currently medically stable.   She feels that more free time will afford her better opportunities to meal plan/prep.  Subjective:   1. Type 2 diabetes mellitus with other specified complication, without long-term current use of insulin (HCC) She is on Ozempic 0.5 mg once weekly.  She denies mass in neck, dysphagia, dyspepsia, or persistent hoarseness.   Lab Results  Component Value Date   HGBA1C 6.1 (H) 05/13/2021   HGBA1C 6.4 (A) 02/10/2021   HGBA1C 6.7 (H) 11/14/2020   Lab Results  Component Value Date   MICROALBUR <5.0 02/10/2021   LDLCALC 62 11/14/2020   CREATININE 0.72 05/13/2021   Lab Results  Component Value Date   INSULIN 9.7 05/13/2021   INSULIN 4.0 11/14/2020   2. Hypertension associated with diabetes [facility]) BP/HR excellent at office visit.  BP Readings from Last 3 Encounters:  06/11/21 115/72  05/27/21 112/70  05/13/21 102/68   3. At risk for nausea [name] is at risk for nausea due to taking a GLP-1 for T2D.  Assessment/Plan:   1. Type 2 diabetes mellitus with other specified complication, without long-term current use of insulin ([place]) Once finished with Ozempic 0.5 mg, increase to 1 mg. Refill Ozempic 1 mg once weekly, as per below.  - Refill  Semaglutide,0.25 or 0.'5MG'$ /DOS, (OZEMPIC, 0.25 OR 0.5 MG/DOSE,) 2 MG/1.5ML SOPN; INJECT '1MG'$  INTO THE SKIN ONCE A WEEK  Dispense: 4.5 mL; Refill: 0  2. Hypertension associated with diabetes ([place]) Continue antihypertensive therapy.  3. At risk for nausea [name] was given approximately 15 minutes of nausea prevention counseling today. Laurie Mckinney is at risk for nausea due to her new or current medication. She was encouraged to titrate her medication slowly, make sure to stay hydrated, eat smaller portions throughout the day, and avoid high fat meals.    4. Obesity with current BMI 33.0  Laurie Mckinney is currently in the action stage of change. As such, her goal is to continue with weight loss efforts. She has agreed to the Category 3 Plan.   Exercise goals:  Walk.  Behavioral modification strategies: increasing lean protein intake, decreasing simple carbohydrates, meal planning and cooking strategies, keeping healthy foods in the home, and planning for success.  Laurie Mckinney has agreed to follow-up with our clinic in 2 weeks. She was informed of the importance of frequent follow-up visits to maximize her success with intensive lifestyle modifications for her multiple health conditions.   Objective:   Blood pressure 115/72, pulse (!) 59, temperature (!) 97.3 F (36.3 C), height '5\' 11"'$  (1.803 m), weight 236 lb (107 kg), last menstrual period 01/26/2011, SpO2 98 %. Body mass index is 32.92 kg/m.  General: Cooperative, alert, well developed, in no acute distress. HEENT: Conjunctivae and lids unremarkable. Cardiovascular: Regular rhythm.  Lungs: Normal  work of breathing. Neurologic: No focal deficits.   Lab Results  Component Value Date   CREATININE 0.72 05/13/2021   BUN 17 05/13/2021   NA 138 05/13/2021   K 4.8 05/13/2021   CL 99 05/13/2021   CO2 20 05/13/2021   Lab Results  Component Value Date   ALT 14 05/13/2021   AST 18 05/13/2021   ALKPHOS 92 05/13/2021   BILITOT 0.5 05/13/2021    Lab Results  Component Value Date   HGBA1C 6.1 (H) 05/13/2021   HGBA1C 6.4 (A) 02/10/2021   HGBA1C 6.7 (H) 11/14/2020   HGBA1C 6.5 (A) 01/15/2020   HGBA1C 6.9 (A) 10/04/2019   Lab Results  Component Value Date   INSULIN 9.7 05/13/2021   INSULIN 4.0 11/14/2020   Lab Results  Component Value Date   TSH 0.724 11/14/2020   Lab Results  Component Value Date   CHOL 135 11/14/2020   HDL 62 11/14/2020   LDLCALC 62 11/14/2020   TRIG 45 11/14/2020   CHOLHDL 2.6 10/05/2019   Lab Results  Component Value Date   VD25OH 40.5 11/14/2020   VD25OH 35 08/14/2011   Lab Results  Component Value Date   WBC 7.8 11/14/2020   HGB 14.5 11/14/2020   HCT 43.2 11/14/2020   MCV 92 11/14/2020   PLT 289 11/14/2020   Attestation Statements:   Reviewed by clinician on day of visit: allergies, medications, problem list, medical history, surgical history, family history, social history, and previous encounter notes.  I, Water quality scientist, CMA, am acting as Location manager for Mina Marble, NP.  I have reviewed the above documentation for accuracy and completeness, and I agree with the above. -  Mora Pedraza d. Krithika Tome, NP-C

## 2021-06-16 ENCOUNTER — Ambulatory Visit: Payer: BC Managed Care – PPO | Admitting: Family Medicine

## 2021-06-20 ENCOUNTER — Ambulatory Visit (INDEPENDENT_AMBULATORY_CARE_PROVIDER_SITE_OTHER): Payer: BC Managed Care – PPO | Admitting: Family Medicine

## 2021-06-20 ENCOUNTER — Other Ambulatory Visit: Payer: Self-pay

## 2021-06-20 VITALS — BP 110/78 | HR 67 | Temp 98.1°F | Ht 71.0 in | Wt 240.4 lb

## 2021-06-20 DIAGNOSIS — N393 Stress incontinence (female) (male): Secondary | ICD-10-CM | POA: Diagnosis not present

## 2021-06-20 DIAGNOSIS — Z23 Encounter for immunization: Secondary | ICD-10-CM | POA: Diagnosis not present

## 2021-06-20 DIAGNOSIS — F419 Anxiety disorder, unspecified: Secondary | ICD-10-CM

## 2021-06-20 DIAGNOSIS — E785 Hyperlipidemia, unspecified: Secondary | ICD-10-CM

## 2021-06-20 DIAGNOSIS — Z6837 Body mass index (BMI) 37.0-37.9, adult: Secondary | ICD-10-CM

## 2021-06-20 DIAGNOSIS — E1169 Type 2 diabetes mellitus with other specified complication: Secondary | ICD-10-CM

## 2021-06-20 DIAGNOSIS — D6869 Other thrombophilia: Secondary | ICD-10-CM

## 2021-06-20 DIAGNOSIS — E1159 Type 2 diabetes mellitus with other circulatory complications: Secondary | ICD-10-CM | POA: Diagnosis not present

## 2021-06-20 DIAGNOSIS — F32A Depression, unspecified: Secondary | ICD-10-CM

## 2021-06-20 DIAGNOSIS — I152 Hypertension secondary to endocrine disorders: Secondary | ICD-10-CM

## 2021-06-20 LAB — POCT GLYCOSYLATED HEMOGLOBIN (HGB A1C): Hemoglobin A1C: 5.8 % — AB (ref 4.0–5.6)

## 2021-06-20 NOTE — Progress Notes (Signed)
Subjective:    Patient ID: Laurie Mckinney, female    DOB: 24-Dec-1960, 60 y.o.   MRN: [id number]  Laurie Mckinney is a 60 y.o. female who presents for follow-up of Type 2 diabetes mellitus.  Home blood sugar records: fasting range: lowest reading 119 highest reading 142 Current symptoms/problems include none and have been stable. Daily foot checks:yes  Any foot concerns: pain in both feet and legs Exercise:  staying active at home at least 20 min a day Diet: good She continues to be followed at medical weight loss and wellness.  She has lost roughly 40 pounds since starting on this program.  She continues to see urology for her underlying incontinence.  Psychologically she is doing quite nicely and plans to continue on her present psychotropic medications.  She does have a previous history of A. fib and has had an ablation however still on Xarelto.  She is taking Neurontin for her neuropathy and seems to have this under fairly good control.  She continues on Lipitor and having no difficulty with that.  She is also taking lisinopril/HCTZ. The following portions of the patient's history were reviewed and updated as appropriate: allergies, current medications, past medical history, past social history and problem list.  ROS as in subjective above.     Objective:    Physical Exam Alert and in no distress otherwise not examined.  Last menstrual period 01/26/2011.  Lab Review Diabetic Labs Latest Ref Rng & Units 05/13/2021 02/10/2021 11/14/2020 01/15/2020 10/11/2019  HbA1c 4.8 - 5.6 % 6.1(H) 6.4(A) 6.7(H) 6.5(A) -  Microalbumin mg/L - <5.0 - - -  Micro/Creat Ratio - - <4.9 - - -  Chol 100 - 199 mg/dL - - 135 - -  HDL >39 mg/dL - - 62 - -  Calc LDL 0 - 99 mg/dL - - 62 - -  Triglycerides 0 - 149 mg/dL - - 45 - -  Creatinine 0.57 - 1.00 mg/dL 0.72 - 0.78 - 0.74   BP/Weight 06/11/2021 05/27/2021 05/13/2021 04/28/2021 [id number]  Systolic BP [id number]  Diastolic BP 72 70 68 71 72  Wt. (Lbs)  236 236 238 239 240  BMI 32.92 32.92 33.19 33.33 33.47   Foot/eye exam completion dates Latest Ref Rng & Units 04/18/2021 02/10/2021  Eye Exam No Retinopathy Retinopathy(A) -  Foot Form Completion - - Done  Hemoglobin A1c is 5.8  Laurie Mckinney  reports that she has never smoked. She has never used smokeless tobacco. She reports current alcohol use of about 1.0 standard drink per week. She reports that she does not use drugs.     Assessment & Plan:    Type 2 diabetes mellitus with other specified complication, without long-term current use of insulin (HCC) - Plan: POCT glycosylated hemoglobin (Hb A1C)  Need for influenza vaccination - Plan: Flu Vaccine QUAD 61moIM (Fluarix, Fluzone & Alfiuria Quad PF)  Hypertension associated with diabetes ([facility]  Female stress incontinence  Secondary hypercoagulable state ([place]  Hyperlipidemia associated with type 2 diabetes mellitus ([place]  Anxiety and depression  Class 2 severe obesity with serious comorbidity and body mass index (BMI) of 37.0 to 37.9 in adult, unspecified obesity type ([facility]   Rx changes: none Education: Reviewed 'ABCs' of diabetes management (respective goals in parentheses):  A1C (<7), blood pressure (<130/80), and cholesterol (LDL <100). Compliance at present is estimated to be excellent. Efforts to improve compliance (if necessary) will be directed at increased exercise. Follow up: 6 months  I congratulated her  on the good work that she is doing.  Recommend she discuss possibly stopping Xarelto with her cardiologist.  She will continue on her other medications specifically the psychotropics.  She seems to be very stable on this and I see no reason to stop this medications anytime soon.  She will also continue on her Neurontin.

## 2021-06-24 ENCOUNTER — Other Ambulatory Visit: Payer: Self-pay | Admitting: Family Medicine

## 2021-06-24 DIAGNOSIS — F341 Dysthymic disorder: Secondary | ICD-10-CM

## 2021-06-24 NOTE — Telephone Encounter (Signed)
[facility] is requesting to fill pt paxil. Please advise [facility]

## 2021-06-30 NOTE — Progress Notes (Signed)
PATIENT: Laurie Mckinney DOB: 12-26-1960  REASON FOR VISIT: follow up HISTORY FROM: patient Primary neurologist: Dr. Jaynee Eagles Sleep neurologist: Dr. Brett Fairy  HISTORY OF PRESENT ILLNESS: Today 07/01/21:  Ms. Laurie Mckinney is a 60 year old female with a history of obstructive sleep apnea on CPAP and peripheral neuropathy.  OSA on CPAP: Reports that CPAP is working well.  Denies any new issues.  Download is below  Neuropathy: Reports that she is getting more discomfort throughout the day.  When she takes her dose of gabapentin at night she sleeps well and does not wake up with any pain.  She continues to have numbness in both feet.  Denies any changes with her gait or balance.      Ms. Laurie Mckinney is a 60 year old female with a history of obstructive sleep apnea on CPAP and neuropathy.  She returns today for follow-up.  She reports that the CPAP works well for her.  She even tries to use it when she takes a nap.  Denies any issues.  Neuropathy: She feels that is gotten slightly worse.  Still only extends up to the ankles.  Primarily has numbness in the toes.  Notices most of her discomfort when she gets in the bed.  States that she occasionally gets sharp shooting pains but otherwise the numbness is what bothers her the most.  [facility] at [facility]  Reports that she is lost 16 pounds.  Her last hemoglobin A1c was 6.7.  Reports that she is trying to lower her sugar numbers.    HISTORY (copied from Dr. Cathren Laine note) 09/14/2019: Since I last saw patient she was diagnosed with sleep apnea, she underwent sleep study on July 09, 2019 which showed severe mixed sleep apnea and was recommended initiation of CPAP, she is been extremely compliant.  Requested back today by [name] DPM for a new issue, she had a foot exam because she has numbness in the bottom of her feet, if she has slippers on she can't tell whether her shoes are on or off, numbness, when she shaves she has  numbness up her legs, a numbing sensation to above the ankles. She saw a podiatrist, she saw Dr. Redmond School.  She is obese, she has diabetes, last HgbA1c was 6.6(reviewed labs) but has been higher per patient. Brother drank a 6-pack for 30 years and had neuropathy but no other family history. Really numb, no significant pain, notices it more at night, nothing makes it better or worse, her feet get hot, ongoing for years, slowly progressive, symmetric. No other focal neurologic deficits, associated symptoms, inciting events or modifiable factors..No weakness. No lumbar radiculopathy or connection with the low back.     I reviewed patient's notes from Dr. [name], patient has symptoms in both feet, she has noticed this as an example shaving her legs, the feet feels a sensitive up to her ankle, she does notice numbness more so at rest without her shoes on, noticeable more so this year, recently diagnosed as diabetic, unclear how long she was prediabetic for a while, she has been advised on trying to be diet controlled, lots of healthcare issues, dizziness, slurring speech, worsening control of hands, following, she has been evaluated by neurology and cardiology and diagnosed with A. 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 fib and Provigil for her sleeping disorder, palpable pedal pulses, pronounced fifth metatarsal head bilaterally with callus under the right loss of deep vibratory sensation at the left foot metatarsal heads, intact but diminished positional testing of the hallux, x-rays (reviewed reports) mild residual metatarsus abductus foot type, with tailor's bunion appreciated bilaterally, spurring noted on the distal phalanx medially, mild deformity bilaterally, on  lateral view relatively high arched foot type, no subluxation, dislocation of arch diagnosed with peripheral neuropathy and numbness of the foot.   TSH nml, B12 976, hepc negative.    I reviewed MRi cervical spine images and agree with the following: MRI cervical spine (without) demonstrating: - At C5-6: disc bulging with borderline mild spinal stenosis and moderate left foraminal stenosis; no cord signal abnormalities.   HPI:  Laurie Mckinney is a 60 y.o. female here as requested by Dr. Glade Lloyd for aphasia.  Past medical history obesity, Mobitz type II second-degree heart block, migraines, hypertension, diabetes, a fib, asthma, adjustment insomnia.She is here with her husband. She "cannot control her body", she is wobbly, she is short of breath, she misjudges everything, she throws things and they don;t end up where she wants, her long term memory is doing well but can;t get her brain to function, poor eye-hand coordination, she feels very fatigued. Here with hr husband who provides much information. She feels her depression and anxiety, no focal weakness, no facial droop, no dysarthria. 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    Reviewed notes, labs and imaging from outside physicians, which showed: I reviewed Dr. Madelyn Brunner notes.  She was most recently seen earlier this month.  Patient reported dizziness and new onset atrial fibrillation ended up seeing A. fib clinic July 15 and was sent to the emergency room for possible stroke.  She continues to feel "dizzy and loopy".  She has trouble with coordination between her brain or arms and not feeling herself.  She only has a note writing her out of work through the end of this week and that she is not safe to drive or go back to work next week.  She continues to feel dizzy and loopy.  Trouble with concentration  between her brain and arms.  Trouble finding words at times are making clear thoughts at times.  Processing speed seems off.  She is concerned about sleep apnea given history of snoring, she does have pauses in her sleep according to her husband, she is gained 30 pounds in the last 4 years   Personally reviewed MRI of the brain April 19, 2019 and agree with the following:No acute or significant brain finding. Normal study with exception of a few punctate foci of T2 and FLAIR signal within the white matter consistent with minimal small vessel change. There is a significant amount of stress, she works overnight, she doesn't sleep well.   TSH normal hgba1c 6.8 Cbc/cmp unremarkable B12 976    REVIEW OF SYSTEMS: Out of a complete 14 system review of symptoms, the patient complains only of the following symptoms, and all other reviewed systems are negative.    Fatigue severity score 33 Epworth sleepiness score 14  ALLERGIES: No Known Allergies  HOME MEDICATIONS: Outpatient Medications Prior to Visit  Medication Sig Dispense Refill   acetaminophen (TYLENOL) 650 MG CR tablet Take  650 mg by mouth every 8 (eight) hours as needed for pain.     atorvastatin (LIPITOR) 10 MG tablet TAKE 1 TABLET DAILY 90 tablet 1   buPROPion ER (WELLBUTRIN SR) 100 MG 12 hr tablet TAKE 1 TABLET BY MOUTH EVERY DAY 90 tablet 1   gabapentin (NEURONTIN) 100 MG capsule Take 2 capsules (200 mg total) by mouth at bedtime as needed. 180 capsule 1   Lancets (ONETOUCH DELICA PLUS [id number]) MISC USE AND DISCARD 1 LANCET   DAILY 100 each 3   lisinopril-hydrochlorothiazide (ZESTORETIC) 20-25 MG tablet TAKE 1 TABLET DAILY (DOSE INCREASE) 30 tablet 0   Misc Natural Products (IMMUNE FORMULA PO) Take 2 tablets by mouth daily. immuneti     Multiple Vitamins-Minerals (CENTRUM SILVER 50+WOMEN) TABS Take 1 tablet by mouth daily.     Omega-3 Fatty Acids (FISH OIL) 1200 MG CAPS Take 2 capsules by mouth at bedtime.     ONETOUCH VERIO test  strip USE AND DISCARD 1 TEST     STRIP AS NEEDED AS         INSTRUCTED 100 each 3   oxybutynin (DITROPAN-XL) 10 MG 24 hr tablet TAKE 1 TABLET BY MOUTH EVERYDAY AT BEDTIME 30 tablet 1   PARoxetine (PAXIL) 20 MG tablet TAKE 1 TABLET BY MOUTH EVERY DAY 90 tablet 1   rivaroxaban (XARELTO) 20 MG TABS tablet Take 20 mg by mouth daily with supper.     Semaglutide,0.25 or 0.5MG /DOS, (OZEMPIC, 0.25 OR 0.5 MG/DOSE,) 2 MG/1.5ML SOPN INJECT 1MG  INTO THE SKIN ONCE A WEEK 4.5 mL 0   UNABLE TO FIND Take 1 tablet by mouth Nightly. Med Name: Theotis Burrow     Vibegron (GEMTESA) 75 MG TABS Take by mouth at bedtime.     No facility-administered medications prior to visit.    PAST MEDICAL HISTORY: Past Medical History:  Diagnosis Date   Adjustment insomnia    SHIFT WORK   Allergy    RHINITIS   Asthma    Atrial flutter ([place]) 02/26/15   [facility]   Back pain    Bursitis    right hip   Cataract    see last eye eexam note    Constipation    Depression    Diabetes mellitus without complication (HCC)    History of atrial fibrillation    History of cancer    Hypertension    Incontinence    Joint pain    Migraine headache    Mobitz type 2 second degree heart block    Neuropathy    Obesity    Other fatigue    Shortness of breath on exertion    Sleep apnea     PAST SURGICAL HISTORY: Past Surgical History:  Procedure Laterality Date   ABDOMINAL HYSTERECTOMY     ATRIAL FIBRILLATION ABLATION N/A 10/27/2019   Procedure: ATRIAL FIBRILLATION ABLATION;  Surgeon: Constance Haw, MD;  Location: [facility];  Service: Cardiovascular;  Laterality: N/A;   CARDIOVERSION N/A 05/25/2019   Procedure: CARDIOVERSION;  Surgeon: Arnoldo Lenis, MD;  Location: [facility];  Service: Endoscopy;  Laterality: N/A;   COLONOSCOPY  2011   Dr.Brodie   ROBOTIC ASSISTED TOTAL HYSTERECTOMY WITH BILATERAL SALPINGO OOPHERECTOMY Bilateral 02/05/2015   Procedure: ROBOTIC ASSISTED TOTAL LAPAROSCOPIC  HYSTERECTOMY WITH BILATERAL SALPINGO OOPHORECTOMY;  Surgeon: Everitt Amber, MD;  Location: [facility];  Service: Gynecology;  Laterality: Bilateral;    FAMILY HISTORY: Family History  Problem Relation Age of Onset   Arthritis Mother  Heart disease Father    Hypertension Father    Hyperlipidemia Father    Sudden death Father    Neuropathy Brother        both legs    Diabetes Brother        no treatment that the pt knows of    Other Brother        drank a 6 pack of beer daily x 30 years    Sleep apnea Neg Hx     SOCIAL HISTORY: Social History   Socioeconomic History   Marital status: Married    Spouse name: Falyn Rubel   Number of children: 2   Years of education: Not on file   Highest education level: Not on file  Occupational History   Occupation: Conservation officer, historic buildings for parent  Tobacco Use   Smoking status: Never   Smokeless tobacco: Never  Vaping Use   Vaping Use: Never used  Substance and Sexual Activity   Alcohol use: Yes    Alcohol/week: 1.0 standard drink    Types: 1 Standard drinks or equivalent per week   Drug use: No   Sexual activity: Yes    Partners: Male    Comment: Has had hysterectomy.  Other Topics Concern   Not on file  Social History Narrative   Lives at home with husband   Right handed   Caffeine: 1 cup of coffee in the morning and <20 oz of soda per day   Social Determinants of Health   Financial Resource Strain: Not on file  Food Insecurity: Not on file  Transportation Needs: Not on file  Physical Activity: Not on file  Stress: Not on file  Social Connections: Not on file  Intimate Partner Violence: Not on file      PHYSICAL EXAM  Vitals:   07/01/21 0806  BP: 126/81  Pulse: 67  Weight: 238 lb 12.8 oz (108.3 kg)  Height: 5\' 11"  (1.803 m)   Body mass index is 33.31 kg/m.  Generalized: Well developed, in no acute distress   Neurological examination  Mentation: Alert oriented to time, place, history taking. Follows all commands  speech and language fluent Cranial nerve II-XII: Pupils were equal round reactive to light. Extraocular movements were full, visual field were full on confrontational test. Facial sensation and strength were normal. Uvula tongue midline. Head turning and shoulder shrug  were normal and symmetric. Motor: The motor testing reveals 5 over 5 strength of all 4 extremities. Good symmetric motor tone is noted throughout.  Coordination: Cerebellar testing reveals good finger-nose-finger and heel-to-shin bilaterally.  Gait and station: Gait is normal.  Reflexes: Deep tendon reflexes are symmetric and normal bilaterally.   DIAGNOSTIC DATA (LABS, IMAGING, TESTING) - I reviewed patient records, labs, notes, testing and imaging myself where available.  Lab Results  Component Value Date   WBC 7.8 11/14/2020   HGB 14.5 11/14/2020   HCT 43.2 11/14/2020   MCV 92 11/14/2020   PLT 289 11/14/2020      Component Value Date/Time   NA 138 05/13/2021 1003   K 4.8 05/13/2021 1003   CL 99 05/13/2021 1003   CO2 20 05/13/2021 1003   GLUCOSE 177 (H) 05/13/2021 1003   GLUCOSE 133 10/11/2019 0945   BUN 17 05/13/2021 1003   CREATININE 0.72 05/13/2021 1003   CREATININE 0.74 10/11/2019 0945   CALCIUM 9.4 05/13/2021 1003   PROT 6.8 05/13/2021 1003   ALBUMIN 4.2 05/13/2021 1003   AST 18 05/13/2021 1003   ALT 14  05/13/2021 1003   ALKPHOS 92 05/13/2021 1003   BILITOT 0.5 05/13/2021 1003   GFRNONAA 82 11/14/2020 0944   GFRAA 95 11/14/2020 0944   Lab Results  Component Value Date   CHOL 135 11/14/2020   HDL 62 11/14/2020   LDLCALC 62 11/14/2020   TRIG 45 11/14/2020   CHOLHDL 2.6 10/05/2019   Lab Results  Component Value Date   HGBA1C 5.8 (A) 06/20/2021   Lab Results  Component Value Date   VITAMINB12 1,545 (H) 11/14/2020   Lab Results  Component Value Date   TSH 0.724 11/14/2020      ASSESSMENT AND PLAN 60 y.o. year old female  has a past medical history of Adjustment insomnia, Allergy,  Asthma, Atrial flutter ([place]) (02/26/15), Back pain, Bursitis, Cataract, Constipation, Depression, Diabetes mellitus without complication ([place]), History of atrial fibrillation, History of cancer, Hypertension, Incontinence, Joint pain, Migraine headache, Mobitz type 2 second degree heart block, Neuropathy, Obesity, Other fatigue, Shortness of breath on exertion, and Sleep apnea. here with: -Good compliance 1.  Obstructive sleep apnea on CPAP   --Good treatment of apnea --Encouraged her to continue using CPAP nightly and greater than 4 hours each night  2.  Peripheral neuropathy  -- Increase gabapentin 200 mg in the morning and 200 mg at bedtime -- Advised if symptoms worsen or she develops new symptoms she should let us know  Advised if symptoms worsen or she develops new symptoms she should let us know.  Follow-up in 1 year or sooner if needed  .  Ward Givens, MSN, NP-C 07/01/2021, 8:09 AM [facility] [address], [place], [place] [address] [phone]

## 2021-07-01 ENCOUNTER — Other Ambulatory Visit: Payer: Self-pay

## 2021-07-01 ENCOUNTER — Encounter: Payer: Self-pay | Admitting: Adult Health

## 2021-07-01 ENCOUNTER — Ambulatory Visit: Payer: BC Managed Care – PPO | Admitting: Adult Health

## 2021-07-01 VITALS — BP 126/81 | HR 67 | Ht 71.0 in | Wt 238.8 lb

## 2021-07-01 DIAGNOSIS — Z9989 Dependence on other enabling machines and devices: Secondary | ICD-10-CM

## 2021-07-01 DIAGNOSIS — G4733 Obstructive sleep apnea (adult) (pediatric): Secondary | ICD-10-CM | POA: Diagnosis not present

## 2021-07-01 DIAGNOSIS — E1142 Type 2 diabetes mellitus with diabetic polyneuropathy: Secondary | ICD-10-CM

## 2021-07-01 MED ORDER — GABAPENTIN 100 MG PO CAPS: ORAL_CAPSULE | ORAL | 3 refills | Status: DC

## 2021-07-01 NOTE — Patient Instructions (Signed)
Continue using CPAP nightly and greater than 4 hours each night Increase gabapentin 100 mg in the morning and 200 mg at bedtime If your symptoms worsen or you develop new symptoms please let us know.

## 2021-07-01 NOTE — Progress Notes (Signed)
PATIENT: Laurie Mckinney DOB: 1960-11-21  REASON FOR VISIT: follow up HISTORY FROM: patient Primary neurologist: Dr. Jaynee Eagles Sleep neurologist: Dr. Brett Fairy  HISTORY OF PRESENT ILLNESS: Today 07/01/21:  Laurie Mckinney is a 60 year old female with a history of obstructive sleep apnea on CPAP and peripheral neuropathy.  OSA on CPAP: Reports that CPAP is working well.  Denies any new issues.  Download is below  Neuropathy: Reports that she is getting more discomfort throughout the day.  When she takes her dose of gabapentin at night she sleeps well and does not wake up with any pain.  She continues to have numbness in both feet.  Denies any changes with her gait or balance.      Laurie Mckinney is a 60 year old female with a history of obstructive sleep apnea on CPAP and neuropathy.  She returns today for follow-up.  She reports that the CPAP works well for her.  She even tries to use it when she takes a nap.  Denies any issues.  Neuropathy: She feels that is gotten slightly worse.  Still only extends up to the ankles.  Primarily has numbness in the toes.  Notices most of her discomfort when she gets in the bed.  States that she occasionally gets sharp shooting pains but otherwise the numbness is what bothers her the most.  [facility] at [facility]  Reports that she is lost 16 pounds.  Her last hemoglobin A1c was 6.7.  Reports that she is trying to lower her sugar numbers.    HISTORY (copied from Dr. Cathren Laine note) 09/14/2019: Since I last saw patient she was diagnosed with sleep apnea, she underwent sleep study on July 09, 2019 which showed severe mixed sleep apnea and was recommended initiation of CPAP, she is been extremely compliant.  Requested back today by [name] DPM for a new issue, she had a foot exam because she has numbness in the bottom of her feet, if she has slippers on she can't tell whether her shoes are on or off, numbness, when she shaves she has  numbness up her legs, a numbing sensation to above the ankles. She saw a podiatrist, she saw Dr. Redmond School.  She is obese, she has diabetes, last HgbA1c was 6.6(reviewed labs) but has been higher per patient. Brother drank a 6-pack for 30 years and had neuropathy but no other family history. Really numb, no significant pain, notices it more at night, nothing makes it better or worse, her feet get hot, ongoing for years, slowly progressive, symmetric. No other focal neurologic deficits, associated symptoms, inciting events or modifiable factors..No weakness. No lumbar radiculopathy or connection with the low back.     I reviewed patient's notes from Dr. [name], patient has symptoms in both feet, she has noticed this as an example shaving her legs, the feet feels a sensitive up to her ankle, she does notice numbness more so at rest without her shoes on, noticeable more so this year, recently diagnosed as diabetic, unclear how long she was prediabetic for a while, she has been advised on trying to be diet controlled, lots of healthcare issues, dizziness, slurring speech, worsening control of hands, following, she has been evaluated by neurology and cardiology and diagnosed with A. 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 fib and Provigil for her sleeping disorder, palpable pedal pulses, pronounced fifth metatarsal head bilaterally with callus under the right loss of deep vibratory sensation at the left foot metatarsal heads, intact but diminished positional testing of the hallux, x-rays (reviewed reports) mild residual metatarsus abductus foot type, with tailor's bunion appreciated bilaterally, spurring noted on the distal phalanx medially, mild deformity bilaterally, on  lateral view relatively high arched foot type, no subluxation, dislocation of arch diagnosed with peripheral neuropathy and numbness of the foot.   TSH nml, B12 976, hepc negative.    I reviewed MRi cervical spine images and agree with the following: MRI cervical spine (without) demonstrating: - At C5-6: disc bulging with borderline mild spinal stenosis and moderate left foraminal stenosis; no cord signal abnormalities.   HPI:  Laurie Mckinney is a 60 y.o. female here as requested by Dr. Glade Lloyd for aphasia.  Past medical history obesity, Mobitz type II second-degree heart block, migraines, hypertension, diabetes, a fib, asthma, adjustment insomnia.She is here with her husband. She "cannot control her body", she is wobbly, she is short of breath, she misjudges everything, she throws things and they don;t end up where she wants, her long term memory is doing well but can;t get her brain to function, poor eye-hand coordination, she feels very fatigued. Here with hr husband who provides much information. She feels her depression and anxiety, no focal weakness, no facial droop, no dysarthria. 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    Reviewed notes, labs and imaging from outside physicians, which showed: I reviewed Dr. Madelyn Brunner notes.  She was most recently seen earlier this month.  Patient reported dizziness and new onset atrial fibrillation ended up seeing A. fib clinic July 15 and was sent to the emergency room for possible stroke.  She continues to feel "dizzy and loopy".  She has trouble with coordination between her brain or arms and not feeling herself.  She only has a note writing her out of work through the end of this week and that she is not safe to drive or go back to work next week.  She continues to feel dizzy and loopy.  Trouble with concentration  between her brain and arms.  Trouble finding words at times are making clear thoughts at times.  Processing speed seems off.  She is concerned about sleep apnea given history of snoring, she does have pauses in her sleep according to her husband, she is gained 30 pounds in the last 4 years   Personally reviewed MRI of the brain April 19, 2019 and agree with the following:No acute or significant brain finding. Normal study with exception of a few punctate foci of T2 and FLAIR signal within the white matter consistent with minimal small vessel change. There is a significant amount of stress, she works overnight, she doesn't sleep well.   TSH normal hgba1c 6.8 Cbc/cmp unremarkable B12 976    REVIEW OF SYSTEMS: Out of a complete 14 system review of symptoms, the patient complains only of the following symptoms, and all other reviewed systems are negative.    Fatigue severity score 33 Epworth sleepiness score 14  ALLERGIES: No Known Allergies  HOME MEDICATIONS: Outpatient Medications Prior to Visit  Medication Sig Dispense Refill   acetaminophen (TYLENOL) 650 MG CR tablet Take  650 mg by mouth every 8 (eight) hours as needed for pain.     atorvastatin (LIPITOR) 10 MG tablet TAKE 1 TABLET DAILY 90 tablet 1   buPROPion ER (WELLBUTRIN SR) 100 MG 12 hr tablet TAKE 1 TABLET BY MOUTH EVERY DAY 90 tablet 1   Lancets (ONETOUCH DELICA PLUS IONGEX52W) MISC USE AND DISCARD 1 LANCET   DAILY 100 each 3   lisinopril-hydrochlorothiazide (ZESTORETIC) 20-25 MG tablet TAKE 1 TABLET DAILY (DOSE INCREASE) 30 tablet 0   Misc Natural Products (IMMUNE FORMULA PO) Take 2 tablets by mouth daily. immuneti     Multiple Vitamins-Minerals (CENTRUM SILVER 50+WOMEN) TABS Take 1 tablet by mouth daily.     Omega-3 Fatty Acids (FISH OIL) 1200 MG CAPS Take 2 capsules by mouth at bedtime.     ONETOUCH VERIO test strip USE AND DISCARD 1 TEST     STRIP AS NEEDED AS         INSTRUCTED 100 each 3   oxybutynin (DITROPAN-XL) 10 MG 24  hr tablet TAKE 1 TABLET BY MOUTH EVERYDAY AT BEDTIME 30 tablet 1   PARoxetine (PAXIL) 20 MG tablet TAKE 1 TABLET BY MOUTH EVERY DAY 90 tablet 1   rivaroxaban (XARELTO) 20 MG TABS tablet Take 20 mg by mouth daily with supper.     Semaglutide,0.25 or 0.5MG /DOS, (OZEMPIC, 0.25 OR 0.5 MG/DOSE,) 2 MG/1.5ML SOPN INJECT 1MG  INTO THE SKIN ONCE A WEEK 4.5 mL 0   UNABLE TO FIND Take 1 tablet by mouth Nightly. Med Name: Theotis Burrow     Vibegron (GEMTESA) 75 MG TABS Take by mouth at bedtime.     gabapentin (NEURONTIN) 100 MG capsule Take 2 capsules (200 mg total) by mouth at bedtime as needed. 180 capsule 1   No facility-administered medications prior to visit.    PAST MEDICAL HISTORY: Past Medical History:  Diagnosis Date   Adjustment insomnia    SHIFT WORK   Allergy    RHINITIS   Asthma    Atrial flutter ([place]) 02/26/15   [facility]   Back pain    Bursitis    right hip   Cataract    see last eye eexam note    Constipation    Depression    Diabetes mellitus without complication (HCC)    History of atrial fibrillation    History of cancer    Hypertension    Incontinence    Joint pain    Migraine headache    Mobitz type 2 second degree heart block    Neuropathy    Obesity    Other fatigue    Shortness of breath on exertion    Sleep apnea     PAST SURGICAL HISTORY: Past Surgical History:  Procedure Laterality Date   ABDOMINAL HYSTERECTOMY     ATRIAL FIBRILLATION ABLATION N/A 10/27/2019   Procedure: ATRIAL FIBRILLATION ABLATION;  Surgeon: Constance Haw, MD;  Location: [facility];  Service: Cardiovascular;  Laterality: N/A;   CARDIOVERSION N/A 05/25/2019   Procedure: CARDIOVERSION;  Surgeon: Arnoldo Lenis, MD;  Location: [facility];  Service: Endoscopy;  Laterality: N/A;   COLONOSCOPY  2011   Dr.Brodie   ROBOTIC ASSISTED TOTAL HYSTERECTOMY WITH BILATERAL SALPINGO OOPHERECTOMY Bilateral 02/05/2015   Procedure: ROBOTIC ASSISTED TOTAL LAPAROSCOPIC  HYSTERECTOMY WITH BILATERAL SALPINGO OOPHORECTOMY;  Surgeon: Everitt Amber, MD;  Location: [facility];  Service: Gynecology;  Laterality: Bilateral;    FAMILY HISTORY: Family History  Problem Relation Age of Onset   Arthritis Mother  Heart disease Father    Hypertension Father    Hyperlipidemia Father    Sudden death Father    Neuropathy Brother        both legs    Diabetes Brother        no treatment that the pt knows of    Other Brother        drank a 6 pack of beer daily x 30 years    Sleep apnea Neg Hx     SOCIAL HISTORY: Social History   Socioeconomic History   Marital status: Married    Spouse name: Kieu Quiggle   Number of children: 2   Years of education: Not on file   Highest education level: Not on file  Occupational History   Occupation: Conservation officer, historic buildings for parent  Tobacco Use   Smoking status: Never   Smokeless tobacco: Never  Vaping Use   Vaping Use: Never used  Substance and Sexual Activity   Alcohol use: Yes    Alcohol/week: 1.0 standard drink    Types: 1 Standard drinks or equivalent per week   Drug use: No   Sexual activity: Yes    Partners: Male    Comment: Has had hysterectomy.  Other Topics Concern   Not on file  Social History Narrative   Lives at home with husband   Right handed   Caffeine: 1 cup of coffee in the morning and <20 oz of soda per day   Social Determinants of Health   Financial Resource Strain: Not on file  Food Insecurity: Not on file  Transportation Needs: Not on file  Physical Activity: Not on file  Stress: Not on file  Social Connections: Not on file  Intimate Partner Violence: Not on file      PHYSICAL EXAM  Vitals:   07/01/21 0806  BP: 126/81  Pulse: 67  Weight: 238 lb 12.8 oz (108.3 kg)  Height: 5\' 11"  (1.803 m)   Body mass index is 33.31 kg/m.  Generalized: Well developed, in no acute distress   Neurological examination  Mentation: Alert oriented to time, place, history taking. Follows all commands  speech and language fluent Cranial nerve II-XII: Pupils were equal round reactive to light. Extraocular movements were full, visual field were full on confrontational test. Facial sensation and strength were normal. Uvula tongue midline. Head turning and shoulder shrug  were normal and symmetric. Motor: The motor testing reveals 5 over 5 strength of all 4 extremities. Good symmetric motor tone is noted throughout.  Coordination: Cerebellar testing reveals good finger-nose-finger and heel-to-shin bilaterally.  Gait and station: Gait is normal.  Reflexes: Deep tendon reflexes are symmetric and normal bilaterally.   DIAGNOSTIC DATA (LABS, IMAGING, TESTING) - I reviewed patient records, labs, notes, testing and imaging myself where available.  Lab Results  Component Value Date   WBC 7.8 11/14/2020   HGB 14.5 11/14/2020   HCT 43.2 11/14/2020   MCV 92 11/14/2020   PLT 289 11/14/2020      Component Value Date/Time   NA 138 05/13/2021 1003   K 4.8 05/13/2021 1003   CL 99 05/13/2021 1003   CO2 20 05/13/2021 1003   GLUCOSE 177 (H) 05/13/2021 1003   GLUCOSE 133 10/11/2019 0945   BUN 17 05/13/2021 1003   CREATININE 0.72 05/13/2021 1003   CREATININE 0.74 10/11/2019 0945   CALCIUM 9.4 05/13/2021 1003   PROT 6.8 05/13/2021 1003   ALBUMIN 4.2 05/13/2021 1003   AST 18 05/13/2021 1003   ALT 14  05/13/2021 1003   ALKPHOS 92 05/13/2021 1003   BILITOT 0.5 05/13/2021 1003   GFRNONAA 82 11/14/2020 0944   GFRAA 95 11/14/2020 0944   Lab Results  Component Value Date   CHOL 135 11/14/2020   HDL 62 11/14/2020   LDLCALC 62 11/14/2020   TRIG 45 11/14/2020   CHOLHDL 2.6 10/05/2019   Lab Results  Component Value Date   HGBA1C 5.8 (A) 06/20/2021   Lab Results  Component Value Date   VITAMINB12 1,545 (H) 11/14/2020   Lab Results  Component Value Date   TSH 0.724 11/14/2020      ASSESSMENT AND PLAN 60 y.o. year old female  has a past medical history of Adjustment insomnia, Allergy,  Asthma, Atrial flutter ([place]) (02/26/15), Back pain, Bursitis, Cataract, Constipation, Depression, Diabetes mellitus without complication ([place]), History of atrial fibrillation, History of cancer, Hypertension, Incontinence, Joint pain, Migraine headache, Mobitz type 2 second degree heart block, Neuropathy, Obesity, Other fatigue, Shortness of breath on exertion, and Sleep apnea. here with: -Good compliance 1.  Obstructive sleep apnea on CPAP   --Good treatment of apnea --Encouraged her to continue using CPAP nightly and greater than 4 hours each night  2.  Peripheral neuropathy  -- Increase gabapentin 200 mg in the morning and 200 mg at bedtime -- Advised if symptoms worsen or she develops new symptoms she should let us know  Advised if symptoms worsen or she develops new symptoms she should let us know.  Follow-up in 1 year or sooner if needed  .  Ward Givens, MSN, NP-C 07/01/2021, 8:31 AM [facility] [address] [place], [place] [address] [phone]

## 2021-07-02 ENCOUNTER — Encounter (INDEPENDENT_AMBULATORY_CARE_PROVIDER_SITE_OTHER): Payer: Self-pay | Admitting: Adult Health

## 2021-07-02 ENCOUNTER — Other Ambulatory Visit: Payer: Self-pay

## 2021-07-02 ENCOUNTER — Ambulatory Visit (INDEPENDENT_AMBULATORY_CARE_PROVIDER_SITE_OTHER): Payer: BC Managed Care – PPO | Admitting: Adult Health

## 2021-07-02 VITALS — BP 106/72 | HR 65 | Temp 98.1°F | Ht 71.0 in | Wt 233.0 lb

## 2021-07-02 DIAGNOSIS — E1169 Type 2 diabetes mellitus with other specified complication: Secondary | ICD-10-CM | POA: Diagnosis not present

## 2021-07-02 DIAGNOSIS — I152 Hypertension secondary to endocrine disorders: Secondary | ICD-10-CM

## 2021-07-02 DIAGNOSIS — Z9189 Other specified personal risk factors, not elsewhere classified: Secondary | ICD-10-CM

## 2021-07-02 DIAGNOSIS — Z6837 Body mass index (BMI) 37.0-37.9, adult: Secondary | ICD-10-CM

## 2021-07-02 DIAGNOSIS — E1159 Type 2 diabetes mellitus with other circulatory complications: Secondary | ICD-10-CM

## 2021-07-02 MED ORDER — SEMAGLUTIDE (1 MG/DOSE) 4 MG/3ML ~~LOC~~ SOPN: 1.0000 mg | PEN_INJECTOR | SUBCUTANEOUS | 0 refills | Status: DC

## 2021-07-02 NOTE — Progress Notes (Signed)
Chief Complaint:   OBESITY Laurie Mckinney is here to discuss her progress with her obesity treatment plan along with follow-up of her obesity related diagnoses. Laurie Mckinney is on the Category 3 Plan and states she is following her eating plan approximately 67% of the time. Laurie Mckinney states she is walking for 10-15 minutes 3 times per week.  Today's visit was #: 14 Starting weight: 268 lbs Starting date: 11/14/2020 Today's weight: 233 lbs Today's date: 07/02/2021 Total lbs lost to date: 35 lbs Total lbs lost since last in-office visit: 3 lbs  Interim History: Laurie Mckinney will modify Category 3 with the following adaptation: For lunch - leftovers from dinner - she will be mindful of portions.  Reviewed bioimpedance with patient.  Subjective:   1. Type 2 diabetes mellitus with other specified complication, without long-term current use of insulin ([place]) On 06/19/2021, A1c 5.8 (at goal) - down from 6.1 on 05/13/2021. She has yet to increase Ozempic to 1 mg.   She had her last dose of 0.5 mg last Friday.  Will increase to 1 mg this Friday.  2. Hypertension associated with diabetes ([place]) She denies dizziness with position change. She denies exercise fatigue. She is on lisinopril/HCTZ 20/25 mg daily.  3. At risk for nausea Aoi is at risk for nausea due to increasing her GLP-1 for T2D.  Assessment/Plan:   1. Type 2 diabetes mellitus with other specified complication, without long-term current use of insulin (HCC) Refill Ozempic 1 mg subcutaneously once weekly, as per below.  Good blood sugar control is important to decrease the likelihood of diabetic complications such as nephropathy, neuropathy, limb loss, blindness, coronary artery disease, and death. Intensive lifestyle modification including diet, exercise and weight loss are the first line of treatment for diabetes.   - Refill Semaglutide, 1 MG/DOSE, 4 MG/3ML SOPN; Inject 1 mg as directed once a week.  Dispense: 9 mL; Refill: 0  2.  Hypertension associated with diabetes ([place]) Monitor for symptoms of hypotension.  Rashanna is working on healthy weight loss and exercise to improve blood pressure control. We will watch for signs of hypotension as she continues her lifestyle modifications.  3. At risk for nausea Laurie Mckinney was given approximately 15 minutes of nausea prevention counseling today. Laurie Mckinney is at risk for nausea due to her new or current medication. She was encouraged to titrate her medication slowly, make sure to stay hydrated, eat smaller portions throughout the day, and avoid high fat meals.    4. Obesity with current BMI 32.5  Laurie Mckinney is currently in the action stage of change. As such, her goal is to continue with weight loss efforts. She has agreed to the Category 3 Plan with 350-500 calories and 35 grams of protein at lunch and 450-600 calories and 40 grams of protein at dinner.   Exercise goals:  Increase walking to 3-4 times per week.  Behavioral modification strategies: increasing lean protein intake, decreasing simple carbohydrates, increasing water intake, keeping healthy foods in the home, ways to avoid boredom eating, and planning for success.  Laurie Mckinney has agreed to follow-up with our clinic in 2-3 weeks. She was informed of the importance of frequent follow-up visits to maximize her success with intensive lifestyle modifications for her multiple health conditions.   Objective:   Blood pressure 106/72, pulse 65, temperature 98.1 F (36.7 C), height 5\' 11"  (1.803 m), weight 233 lb (105.7 kg), last menstrual period 01/26/2011, SpO2 100 %. Body mass index is 32.5 kg/m.  General: Cooperative, alert,  well developed, in no acute distress. HEENT: Conjunctivae and lids unremarkable. Cardiovascular: Regular rhythm.  Lungs: Normal work of breathing. Neurologic: No focal deficits.   Lab Results  Component Value Date   CREATININE 0.72 05/13/2021   BUN 17 05/13/2021   NA 138 05/13/2021   K 4.8  05/13/2021   CL 99 05/13/2021   CO2 20 05/13/2021   Lab Results  Component Value Date   ALT 14 05/13/2021   AST 18 05/13/2021   ALKPHOS 92 05/13/2021   BILITOT 0.5 05/13/2021   Lab Results  Component Value Date   HGBA1C 5.8 (A) 06/20/2021   HGBA1C 6.1 (H) 05/13/2021   HGBA1C 6.4 (A) 02/10/2021   HGBA1C 6.7 (H) 11/14/2020   HGBA1C 6.5 (A) 01/15/2020   Lab Results  Component Value Date   INSULIN 9.7 05/13/2021   INSULIN 4.0 11/14/2020   Lab Results  Component Value Date   TSH 0.724 11/14/2020   Lab Results  Component Value Date   CHOL 135 11/14/2020   HDL 62 11/14/2020   LDLCALC 62 11/14/2020   TRIG 45 11/14/2020   CHOLHDL 2.6 10/05/2019   Lab Results  Component Value Date   VD25OH 40.5 11/14/2020   VD25OH 35 08/14/2011   Lab Results  Component Value Date   WBC 7.8 11/14/2020   HGB 14.5 11/14/2020   HCT 43.2 11/14/2020   MCV 92 11/14/2020   PLT 289 11/14/2020   Attestation Statements:   Reviewed by clinician on day of visit: allergies, medications, problem list, medical history, surgical history, family history, social history, and previous encounter notes.  I, Water quality scientist, CMA, am acting as Location manager for Mina Marble, NP.  I have reviewed the above documentation for accuracy and completeness, and I agree with the above. -  Jourdon Zimmerle d. Tashea Othman, NP-C

## 2021-07-03 ENCOUNTER — Encounter: Payer: Self-pay | Admitting: Family Medicine

## 2021-07-03 ENCOUNTER — Other Ambulatory Visit: Payer: Self-pay

## 2021-07-03 DIAGNOSIS — R32 Unspecified urinary incontinence: Secondary | ICD-10-CM

## 2021-07-03 MED ORDER — OXYBUTYNIN CHLORIDE ER 10 MG PO TB24: ORAL_TABLET | ORAL | 1 refills | Status: DC

## 2021-07-15 ENCOUNTER — Ambulatory Visit (INDEPENDENT_AMBULATORY_CARE_PROVIDER_SITE_OTHER): Payer: BC Managed Care – PPO | Admitting: Adult Health

## 2021-07-15 ENCOUNTER — Encounter (INDEPENDENT_AMBULATORY_CARE_PROVIDER_SITE_OTHER): Payer: Self-pay | Admitting: Adult Health

## 2021-07-15 ENCOUNTER — Other Ambulatory Visit: Payer: Self-pay

## 2021-07-15 VITALS — BP 101/67 | HR 63 | Temp 98.6°F | Ht 71.0 in | Wt 233.0 lb

## 2021-07-15 DIAGNOSIS — I152 Hypertension secondary to endocrine disorders: Secondary | ICD-10-CM | POA: Diagnosis not present

## 2021-07-15 DIAGNOSIS — E1159 Type 2 diabetes mellitus with other circulatory complications: Secondary | ICD-10-CM | POA: Diagnosis not present

## 2021-07-15 DIAGNOSIS — Z6837 Body mass index (BMI) 37.0-37.9, adult: Secondary | ICD-10-CM

## 2021-07-15 DIAGNOSIS — E1169 Type 2 diabetes mellitus with other specified complication: Secondary | ICD-10-CM

## 2021-07-15 NOTE — Progress Notes (Signed)
Chief Complaint:   OBESITY Laurie Mckinney is here to discuss her progress with her obesity treatment plan along with follow-up of her obesity related diagnoses. Laurie Mckinney is on the Category 3 Plan with 350-500 calories and 35 grams of protein at lunch and 450-600 calories and 40 grams of protein at dinner and states she is following her eating plan approximately 50% of the time. Laurie Mckinney states she is doing yard work and walking for 30 minutes 1 time per week.  Today's visit was #: 15 Starting weight: 268 lbs Starting date: 11/14/2020 Today's weight: 233 lbs Today's date: 07/15/2021 Total lbs lost to date: 35 lbs Total lbs lost since last in-office visit: 0  Interim History: Laurie Mckinney was able to increase Ozempic to 1 mg - has had 2 doses at this strength.  She has experienced decreased appetite with increased GLP-1 dose.   She denies mass in neck, dysphagia, dyspepsia, persistent hoarseness, nausea, or worsening constipation. Reviewed bioimpedance with patient.  Of note:  She is considering returning to work at [employer] until age 14.  She will make her decision by month's end.  Subjective:   1. Type 2 diabetes mellitus with other specified complication, without long-term current use of insulin ([place]) On 06/20/2021, A1c 5.8 - at goal. She increased Ozempic to 1 mg.   She denies mass in neck, dysphagia, dyspepsia, persistent hoarseness, nausea, or worsening constipation.  2. Hypertension associated with diabetes ([place]) BP soft, yet she denies symptoms of hypotension.  She is on lisinopril/HCTZ 20/25 mg daily- per pt has been in this antihypertensive 5-6 years.  Assessment/Plan:   1. Type 2 diabetes mellitus with other specified complication, without long-term current use of insulin (HCC) Continue Ozempic 1 mg.  No need for refill.  Three month refill prevents any cost of medication.  2. Hypertension associated with diabetes ([place]) Monitor ambulatory BP - bring log to next office visit.  3.  Obesity with current BMI 32.6  Laurie Mckinney is currently in the action stage of change. As such, her goal is to continue with weight loss efforts. She has agreed to the Category 3 Plan.   Exercise goals: As is.  Behavioral modification strategies: increasing lean protein intake, decreasing simple carbohydrates, meal planning and cooking strategies, keeping healthy foods in the home, and planning for success.  [name] has agreed to follow-up with our clinic in 2 weeks. She was informed of the importance of frequent follow-up visits to maximize her success with intensive lifestyle modifications for her multiple health conditions.   Objective:   Blood pressure 101/67, pulse 63, temperature 98.6 F (37 C), height 5\' 11"  (1.803 m), weight 233 lb (105.7 kg), last menstrual period 01/26/2011, SpO2 99 %. Body mass index is 32.5 kg/m.  General: Cooperative, alert, well developed, in no acute distress. HEENT: Conjunctivae and lids unremarkable. Cardiovascular: Regular rhythm.  Lungs: Normal work of breathing. Neurologic: No focal deficits.   Lab Results  Component Value Date   CREATININE 0.72 05/13/2021   BUN 17 05/13/2021   NA 138 05/13/2021   K 4.8 05/13/2021   CL 99 05/13/2021   CO2 20 05/13/2021   Lab Results  Component Value Date   ALT 14 05/13/2021   AST 18 05/13/2021   ALKPHOS 92 05/13/2021   BILITOT 0.5 05/13/2021   Lab Results  Component Value Date   HGBA1C 5.8 (A) 06/20/2021   HGBA1C 6.1 (H) 05/13/2021   HGBA1C 6.4 (A) 02/10/2021   HGBA1C 6.7 (H) 11/14/2020   HGBA1C 6.5 (A)  01/15/2020   Lab Results  Component Value Date   INSULIN 9.7 05/13/2021   INSULIN 4.0 11/14/2020   Lab Results  Component Value Date   TSH 0.724 11/14/2020   Lab Results  Component Value Date   CHOL 135 11/14/2020   HDL 62 11/14/2020   LDLCALC 62 11/14/2020   TRIG 45 11/14/2020   CHOLHDL 2.6 10/05/2019   Lab Results  Component Value Date   VD25OH 40.5 11/14/2020   VD25OH 35  08/14/2011   Lab Results  Component Value Date   WBC 7.8 11/14/2020   HGB 14.5 11/14/2020   HCT 43.2 11/14/2020   MCV 92 11/14/2020   PLT 289 11/14/2020   Attestation Statements:   Reviewed by clinician on day of visit: allergies, medications, problem list, medical history, surgical history, family history, social history, and previous encounter notes.  Time spent on visit including pre-visit chart review and post-visit care and charting was 28 minutes.   I, Water quality scientist, CMA, am acting as Location manager for Mina Marble, NP.  I have reviewed the above documentation for accuracy and completeness, and I agree with the above. -  Ammarie Matsuura d. Tamyrah Burbage, NP-C

## 2021-07-28 ENCOUNTER — Other Ambulatory Visit: Payer: Self-pay | Admitting: Family Medicine

## 2021-07-29 ENCOUNTER — Other Ambulatory Visit: Payer: Self-pay

## 2021-07-29 ENCOUNTER — Ambulatory Visit (INDEPENDENT_AMBULATORY_CARE_PROVIDER_SITE_OTHER): Payer: BC Managed Care – PPO | Admitting: Adult Health

## 2021-07-29 ENCOUNTER — Encounter (INDEPENDENT_AMBULATORY_CARE_PROVIDER_SITE_OTHER): Payer: Self-pay | Admitting: Adult Health

## 2021-07-29 VITALS — BP 120/76 | HR 66 | Temp 97.7°F | Ht 71.0 in | Wt 233.0 lb

## 2021-07-29 DIAGNOSIS — I152 Hypertension secondary to endocrine disorders: Secondary | ICD-10-CM

## 2021-07-29 DIAGNOSIS — Z6837 Body mass index (BMI) 37.0-37.9, adult: Secondary | ICD-10-CM

## 2021-07-29 DIAGNOSIS — E1159 Type 2 diabetes mellitus with other circulatory complications: Secondary | ICD-10-CM | POA: Diagnosis not present

## 2021-07-29 DIAGNOSIS — E1169 Type 2 diabetes mellitus with other specified complication: Secondary | ICD-10-CM | POA: Diagnosis not present

## 2021-07-29 NOTE — Progress Notes (Signed)
Chief Complaint:   OBESITY Laurie Mckinney is here to discuss her progress with her obesity treatment plan along with follow-up of her obesity related diagnoses. Laurie Mckinney is on the Category 3 Plan and states she is following her eating plan approximately 30% of the time. Laurie Mckinney states she is doing yard work and taking walks for 30 minutes 2 times per week.  Today's visit was #: 15 Starting weight: 268 lbs Starting date: 11/14/2020 Today's weight: 233 lbs Today's date: 07/29/2021 Total lbs lost to date: 35 lbs Total lbs lost since last in-office visit: 0  Interim History: Laurie Mckinney has been on Ozempic 1 mg - for 5 weeks.   She denies mass in neck, dysphagia, dyspepsia, or persistent hoarseness. She will experience mild nausea without vomiting once a week. She will delay eating when nausea occurs  Subjective:   1. Type 2 diabetes mellitus with other specified complication, without long-term current use of insulin ([place]) Discussed labs with patient today.  On 06/20/2021, A1c was 5.8 - at goal. Ambulatory readings fasting 95, 118, 124, 125, 123, 124, 121, 124, 142, 118.  She is on Ozempic 1 mg once weekly - she denies mass in neck, dysphagia, dyspepsia, or persistent hoarseness. She will experience mild nausea without vomiting once a week. She will delay eating when nausea occurs  2. Hypertension associated with diabetes ([place]) Ambulatory readings:  SBO 120-140s,  DBP 70. She denies acute cardiac symptoms.   Assessment/Plan:   1. Type 2 diabetes mellitus with other specified complication, without long-term current use of insulin (HCC) Continue Ozempic 1 mg.   2. Hypertension associated with diabetes ([place]) Continue lisinopril/HCTZ 20/25 mg daily.  3. Obesity with current BMI 32.6  [name] is currently in the action stage of change. As such, her goal is to continue with weight loss efforts. She has agreed to the Category 3 Plan.   Use food scale to measure protein after  cooking.  Exercise goals:  As is.  Behavioral modification strategies: increasing lean protein intake, decreasing simple carbohydrates, meal planning and cooking strategies, keeping healthy foods in the home, and planning for success.  [name] has agreed to follow-up with our clinic in 3 weeks. She was informed of the importance of frequent follow-up visits to maximize her success with intensive lifestyle modifications for her multiple health conditions.   Objective:   Blood pressure 120/76, pulse 66, temperature 97.7 F (36.5 C), height 5\' 11"  (1.803 m), weight 233 lb (105.7 kg), last menstrual period 01/26/2011, SpO2 98 %. Body mass index is 32.5 kg/m.  General: Cooperative, alert, well developed, in no acute distress. HEENT: Conjunctivae and lids unremarkable. Cardiovascular: Regular rhythm.  Lungs: Normal work of breathing. Neurologic: No focal deficits.   Lab Results  Component Value Date   CREATININE 0.72 05/13/2021   BUN 17 05/13/2021   NA 138 05/13/2021   K 4.8 05/13/2021   CL 99 05/13/2021   CO2 20 05/13/2021   Lab Results  Component Value Date   ALT 14 05/13/2021   AST 18 05/13/2021   ALKPHOS 92 05/13/2021   BILITOT 0.5 05/13/2021   Lab Results  Component Value Date   HGBA1C 5.8 (A) 06/20/2021   HGBA1C 6.1 (H) 05/13/2021   HGBA1C 6.4 (A) 02/10/2021   HGBA1C 6.7 (H) 11/14/2020   HGBA1C 6.5 (A) 01/15/2020   Lab Results  Component Value Date   INSULIN 9.7 05/13/2021   INSULIN 4.0 11/14/2020   Lab Results  Component Value Date   TSH 0.724 11/14/2020  Lab Results  Component Value Date   CHOL 135 11/14/2020   HDL 62 11/14/2020   LDLCALC 62 11/14/2020   TRIG 45 11/14/2020   CHOLHDL 2.6 10/05/2019   Lab Results  Component Value Date   VD25OH 40.5 11/14/2020   VD25OH 35 08/14/2011   Lab Results  Component Value Date   WBC 7.8 11/14/2020   HGB 14.5 11/14/2020   HCT 43.2 11/14/2020   MCV 92 11/14/2020   PLT 289 11/14/2020   Attestation  Statements:   Reviewed by clinician on day of visit: allergies, medications, problem list, medical history, surgical history, family history, social history, and previous encounter notes.  Time spent on visit including pre-visit chart review and post-visit care and charting was 30 minutes.   I, Water quality scientist, CMA, am acting as Location manager for Mina Marble, NP.  I have reviewed the above documentation for accuracy and completeness, and I agree with the above. -  Deina Lipsey d. Everitt Wenner, NP-C

## 2021-08-11 ENCOUNTER — Encounter (INDEPENDENT_AMBULATORY_CARE_PROVIDER_SITE_OTHER): Payer: Self-pay | Admitting: Adult Health

## 2021-08-11 ENCOUNTER — Other Ambulatory Visit (INDEPENDENT_AMBULATORY_CARE_PROVIDER_SITE_OTHER): Payer: Self-pay | Admitting: Adult Health

## 2021-08-11 ENCOUNTER — Other Ambulatory Visit: Payer: Self-pay

## 2021-08-11 ENCOUNTER — Ambulatory Visit (INDEPENDENT_AMBULATORY_CARE_PROVIDER_SITE_OTHER): Payer: BC Managed Care – PPO | Admitting: Adult Health

## 2021-08-11 VITALS — BP 106/70 | HR 74 | Temp 98.2°F | Ht 71.0 in | Wt 227.0 lb

## 2021-08-11 DIAGNOSIS — N6489 Other specified disorders of breast: Secondary | ICD-10-CM | POA: Insufficient documentation

## 2021-08-11 DIAGNOSIS — E1169 Type 2 diabetes mellitus with other specified complication: Secondary | ICD-10-CM

## 2021-08-11 DIAGNOSIS — Z9189 Other specified personal risk factors, not elsewhere classified: Secondary | ICD-10-CM

## 2021-08-11 DIAGNOSIS — E1159 Type 2 diabetes mellitus with other circulatory complications: Secondary | ICD-10-CM | POA: Diagnosis not present

## 2021-08-11 DIAGNOSIS — Z6837 Body mass index (BMI) 37.0-37.9, adult: Secondary | ICD-10-CM

## 2021-08-11 DIAGNOSIS — I152 Hypertension secondary to endocrine disorders: Secondary | ICD-10-CM

## 2021-08-11 DIAGNOSIS — S41112A Laceration without foreign body of left upper arm, initial encounter: Secondary | ICD-10-CM | POA: Insufficient documentation

## 2021-08-11 MED ORDER — MUPIROCIN CALCIUM 2 % EX CREA: 1.0000 "application " | TOPICAL_CREAM | Freq: Two times a day (BID) | CUTANEOUS | 0 refills | Status: DC

## 2021-08-11 NOTE — Telephone Encounter (Signed)
Katy ?

## 2021-08-11 NOTE — Progress Notes (Signed)
Chief Complaint:   OBESITY Laurie Mckinney is here to discuss her progress with her obesity treatment plan along with follow-up of her obesity related diagnoses. Laurie Mckinney is on the Category 3 Plan and states she is following her eating plan approximately 85% of the time. Laurie Mckinney states she is walking for 20 minutes 5 times per week.  Today's visit was #: 79 Starting weight: 268 lbs Starting date: 11/14/2020 Today's weight: 227 lbs Today's date: 08/11/2021 Total lbs lost to date: 41 lbs Total lbs lost since last in-office visit: 6 lbs  Interim History: Laurie Mckinney says she had carbohydrate cravings, which prompted hr to bake beef bisque, apple dumplings - home scale - 240 pounds. She then refocused on increasing protein and increasing exercise - today 227 pounds!  Down 6 pounds.  Reviewed bioimpedance results with patient:  increased muscle mass, decreased adipose, decreased water weight.  Subjective:   1. Type 2 diabetes mellitus with other specified complication, without long-term current use of insulin ([place]) On 06/20/2021, A1c was 5.8 - at goal. She is on Ozempic 1 mg.  She denies mass in neck, dysphagia, dyspepsia, persistent hoarseness, or worsening constipation.  2. Hypertension associated with diabetes [facility]) BP/HR excellent at office visit.  Ambulatory readings stable, however she has not taken BP in last week.  3. Laceration of multiple sites of left upper extremity, initial encounter Recently rescued/adopted a pit bull mix puppy, and it has been "chewing on her".  Lacerations noted on bilateral forearms, left foot.  No signs of infection at present.  4. Bilateral pendulous breasts Current breast size - 44 DDD. She reports current constant back pain, rated 7/10.  Described as aching pain.   She is open to referral for breast reduction when she reaches <200 pounds. Current weight 227 lbs.  5. At risk for activity intolerance Loredana is at risk for activity intolerance due to  back pain due to pendulous breasts.  Assessment/Plan:   1. Type 2 diabetes mellitus with other specified complication, without long-term current use of insulin (HCC) Continue Ozempic 1 mg once weekly.  2. Hypertension associated with diabetes ([place]) Continue Zestoretic 20/25 mg daily.  3. Laceration of multiple sites of left upper extremity, initial encounter Start Mupirocin cream 2% - apply topically BID, dispense 15 gram tube, 0 refills.  4. Bilateral pendulous breasts Continue weight loss.  Referral to Plastic Surgery when appropriate.  5. At risk for activity intolerance Anabeth was given approximately 15 minutes of exercise intolerance counseling today. She is 60 y.o. female and has risk factors exercise intolerance including obesity. We discussed intensive lifestyle modifications today with an emphasis on specific weight loss instructions and strategies. Delayni will slowly increase activity as tolerated.  Repetitive spaced learning was employed today to elicit superior memory formation and behavioral change.  6. Obesity with current BMI 31.7  Laurie Mckinney is currently in the action stage of change. As such, her goal is to continue with weight loss efforts. She has agreed to the Category 3 Plan.   Exercise goals:  As is.  Behavioral modification strategies: increasing lean protein intake, decreasing simple carbohydrates, no skipping meals, meal planning and cooking strategies, and planning for success.  Prabhjot has agreed to follow-up with our clinic in 3 weeks. She was informed of the importance of frequent follow-up visits to maximize her success with intensive lifestyle modifications for her multiple health conditions.   Objective:   Blood pressure 106/70, pulse 74, temperature 98.2 F (36.8 C), height 5\' 11"  (1.803 m),  weight 227 lb (103 kg), last menstrual period 01/26/2011, SpO2 97 %. Body mass index is 31.66 kg/m.  General: Cooperative, alert, well developed, in no  acute distress. HEENT: Conjunctivae and lids unremarkable. Cardiovascular: Regular rhythm.  Lungs: Normal work of breathing. Neurologic: No focal deficits.   Lab Results  Component Value Date   CREATININE 0.72 05/13/2021   BUN 17 05/13/2021   NA 138 05/13/2021   K 4.8 05/13/2021   CL 99 05/13/2021   CO2 20 05/13/2021   Lab Results  Component Value Date   ALT 14 05/13/2021   AST 18 05/13/2021   ALKPHOS 92 05/13/2021   BILITOT 0.5 05/13/2021   Lab Results  Component Value Date   HGBA1C 5.8 (A) 06/20/2021   HGBA1C 6.1 (H) 05/13/2021   HGBA1C 6.4 (A) 02/10/2021   HGBA1C 6.7 (H) 11/14/2020   HGBA1C 6.5 (A) 01/15/2020   Lab Results  Component Value Date   INSULIN 9.7 05/13/2021   INSULIN 4.0 11/14/2020   Lab Results  Component Value Date   TSH 0.724 11/14/2020   Lab Results  Component Value Date   CHOL 135 11/14/2020   HDL 62 11/14/2020   LDLCALC 62 11/14/2020   TRIG 45 11/14/2020   CHOLHDL 2.6 10/05/2019   Lab Results  Component Value Date   VD25OH 40.5 11/14/2020   VD25OH 35 08/14/2011   Lab Results  Component Value Date   WBC 7.8 11/14/2020   HGB 14.5 11/14/2020   HCT 43.2 11/14/2020   MCV 92 11/14/2020   PLT 289 11/14/2020   Attestation Statements:   Reviewed by clinician on day of visit: allergies, medications, problem list, medical history, surgical history, family history, social history, and previous encounter notes.  I, Water quality scientist, CMA, am acting as Location manager for Mina Marble, NP.  I have reviewed the above documentation for accuracy and completeness, and I agree with the above. -  Yarima Penman d. Ellenor Wisniewski, NP-C

## 2021-08-11 NOTE — Telephone Encounter (Signed)
Prescription change request.

## 2021-09-02 ENCOUNTER — Ambulatory Visit (INDEPENDENT_AMBULATORY_CARE_PROVIDER_SITE_OTHER): Payer: BC Managed Care – PPO | Admitting: Adult Health

## 2021-09-02 ENCOUNTER — Encounter (INDEPENDENT_AMBULATORY_CARE_PROVIDER_SITE_OTHER): Payer: Self-pay | Admitting: Adult Health

## 2021-09-02 ENCOUNTER — Other Ambulatory Visit: Payer: Self-pay | Admitting: Family Medicine

## 2021-09-02 ENCOUNTER — Other Ambulatory Visit: Payer: Self-pay

## 2021-09-02 VITALS — BP 130/76 | HR 67 | Temp 98.1°F | Ht 71.0 in | Wt 224.0 lb

## 2021-09-02 DIAGNOSIS — E66812 Obesity, class 2: Secondary | ICD-10-CM

## 2021-09-02 DIAGNOSIS — N390 Urinary tract infection, site not specified: Secondary | ICD-10-CM

## 2021-09-02 DIAGNOSIS — E1159 Type 2 diabetes mellitus with other circulatory complications: Secondary | ICD-10-CM

## 2021-09-02 DIAGNOSIS — N6489 Other specified disorders of breast: Secondary | ICD-10-CM

## 2021-09-02 DIAGNOSIS — E1169 Type 2 diabetes mellitus with other specified complication: Secondary | ICD-10-CM

## 2021-09-02 DIAGNOSIS — I152 Hypertension secondary to endocrine disorders: Secondary | ICD-10-CM

## 2021-09-02 DIAGNOSIS — Z9189 Other specified personal risk factors, not elsewhere classified: Secondary | ICD-10-CM

## 2021-09-02 DIAGNOSIS — Z6837 Body mass index (BMI) 37.0-37.9, adult: Secondary | ICD-10-CM

## 2021-09-02 NOTE — Progress Notes (Signed)
Chief Complaint:   OBESITY Laurie Mckinney is here to discuss her progress with her obesity treatment plan along with follow-up of her obesity related diagnoses. Laurie Mckinney is on the Category 3 Plan and states she is following her eating plan approximately 75% of the time. Laurie Mckinney states she is walking for 20-30 minutes 7 times per week.  Today's visit was #: 63 Starting weight: 268 lbs Starting date: 11/14/2020 Today's weight: 224 lbs Today's date: 09/02/2021 Total lbs lost to date: 44 lbs Total lbs lost since last in-office visit: 3 lbs  Interim History:  Laurie Mckinney started her weight loss journey at 286.2 pounds on August 2021.   Current weight today 224.8 pounds/BMI 31.4.  Her mother was discharged from the rehab facility - s/p hospitalization for UTI - sepsis.  She is he;ping her mother get settled in at home.  "I feel good in body and I feel good in my mind."  Her weight goal was 225 pounds, next goal is <200 pounds.  Subjective:   1. Type 2 diabetes mellitus with other specified complication, without long-term current use of insulin ([place]) She is on Ozempic 1 mg - injects on Friday.  She has been on this dose since August 2022. She denies mass in neck, dysphagia, dyspepsia, persistent hoarseness, or GI upset.  2. Recurrent UTI She estimates to experience an UTI - every 18 months.  3. Bilateral pendulous breasts Chronic cervical/thoracic back pain. Current bra size 44 DDD. She is unable to run or perform certain tasks/exercises due to weight and size of her breasts.  4. At high risk for activity intolerance Laurie Mckinney is at risk for activity intolerance due to pendulous breasts.  Assessment/Plan:   1. Type 2 diabetes mellitus with other specified complication, without long-term current use of insulin (HCC) Continue Ozempic 1 mg once weekly.  2. Recurrent UTI Increase water, Follow-up with Urology as needed/directed.  3. Bilateral pendulous breasts Referral to Plastic  Surgery/Dr. Marla Roe.  - Ambulatory referral to Plastic Surgery  4. At high risk for activity intolerance Laurie Mckinney was given approximately 15 minutes of exercise intolerance counseling today. She is 60 y.o. female and has risk factors exercise intolerance including obesity. We discussed intensive lifestyle modifications today with an emphasis on specific weight loss instructions and strategies. Quana will slowly increase activity as tolerated.  Repetitive spaced learning was employed today to elicit superior memory formation and behavioral change.   5. Obesity with current BMI 31.4  Laurie Mckinney is currently in the action stage of change. As such, her goal is to continue with weight loss efforts. She has agreed to the Category 3 Plan.   Check fasting labs at next office visit.  Exercise goals:  As is.  Behavioral modification strategies: increasing lean protein intake, decreasing simple carbohydrates, meal planning and cooking strategies, keeping healthy foods in the home, and planning for success.  Laurie Mckinney has agreed to follow-up with our clinic in 4 weeks. She was informed of the importance of frequent follow-up visits to maximize her success with intensive lifestyle modifications for her multiple health conditions.   Objective:   Blood pressure 130/76, pulse 67, temperature 98.1 F (36.7 C), height 5\' 11"  (1.803 m), weight 224 lb (101.6 kg), last menstrual period 01/26/2011, SpO2 97 %. Body mass index is 31.24 kg/m.  General: Cooperative, alert, well developed, in no acute distress. HEENT: Conjunctivae and lids unremarkable. Cardiovascular: Regular rhythm.  Lungs: Normal work of breathing. Neurologic: No focal deficits.   Lab Results  Component Value Date  CREATININE 0.72 05/13/2021   BUN 17 05/13/2021   NA 138 05/13/2021   K 4.8 05/13/2021   CL 99 05/13/2021   CO2 20 05/13/2021   Lab Results  Component Value Date   ALT 14 05/13/2021   AST 18 05/13/2021   ALKPHOS  92 05/13/2021   BILITOT 0.5 05/13/2021   Lab Results  Component Value Date   HGBA1C 5.8 (A) 06/20/2021   HGBA1C 6.1 (H) 05/13/2021   HGBA1C 6.4 (A) 02/10/2021   HGBA1C 6.7 (H) 11/14/2020   HGBA1C 6.5 (A) 01/15/2020   Lab Results  Component Value Date   INSULIN 9.7 05/13/2021   INSULIN 4.0 11/14/2020   Lab Results  Component Value Date   TSH 0.724 11/14/2020   Lab Results  Component Value Date   CHOL 135 11/14/2020   HDL 62 11/14/2020   LDLCALC 62 11/14/2020   TRIG 45 11/14/2020   CHOLHDL 2.6 10/05/2019   Lab Results  Component Value Date   VD25OH 40.5 11/14/2020   VD25OH 35 08/14/2011   Lab Results  Component Value Date   WBC 7.8 11/14/2020   HGB 14.5 11/14/2020   HCT 43.2 11/14/2020   MCV 92 11/14/2020   PLT 289 11/14/2020   Attestation Statements:   Reviewed by clinician on day of visit: allergies, medications, problem list, medical history, surgical history, family history, social history, and previous encounter notes.  I, Water quality scientist, CMA, am acting as Location manager for Mina Marble, NP.  I have reviewed the above documentation for accuracy and completeness, and I agree with the above. -  Nikeisha Klutz d. Suhayla Chisom, NP-C

## 2021-09-07 ENCOUNTER — Other Ambulatory Visit: Payer: Self-pay | Admitting: Adult Health

## 2021-09-08 ENCOUNTER — Other Ambulatory Visit: Payer: Self-pay | Admitting: *Deleted

## 2021-09-08 ENCOUNTER — Encounter (INDEPENDENT_AMBULATORY_CARE_PROVIDER_SITE_OTHER): Payer: Self-pay

## 2021-09-08 DIAGNOSIS — G4733 Obstructive sleep apnea (adult) (pediatric): Secondary | ICD-10-CM | POA: Diagnosis not present

## 2021-09-08 MED ORDER — GABAPENTIN 100 MG PO CAPS: ORAL_CAPSULE | ORAL | 3 refills | Status: DC

## 2021-09-09 ENCOUNTER — Other Ambulatory Visit (INDEPENDENT_AMBULATORY_CARE_PROVIDER_SITE_OTHER): Payer: Self-pay | Admitting: Adult Health

## 2021-09-09 DIAGNOSIS — E1169 Type 2 diabetes mellitus with other specified complication: Secondary | ICD-10-CM

## 2021-09-09 MED ORDER — SEMAGLUTIDE (1 MG/DOSE) 4 MG/3ML ~~LOC~~ SOPN: 1.0000 mg | PEN_INJECTOR | SUBCUTANEOUS | 0 refills | Status: DC

## 2021-09-09 NOTE — Telephone Encounter (Signed)
LAST APPOINTMENT DATE: 09/02/21 NEXT APPOINTMENT DATE: 10/21/20   [id number] [place] [place] [address] [name] [address] Phone: [phone] Fax: [phone]  [facility] [place] [place] [facility] [address] [place] [address] Phone: [phone] Fax: [phone]  Patient is requesting a refill of the following medications: No prescriptions requested or ordered in this encounter   Date last filled: 07/02/21 Previously prescribed by [facility]  Lab Results      Component                Value               Date                      HGBA1C                   5.8 (A)             06/20/2021                HGBA1C                   6.1 (H)             05/13/2021                HGBA1C                   6.4 (A)             02/10/2021           Lab Results      Component                Value               Date                      MICROALBUR               <5.0                02/10/2021                [place]                  62                  11/14/2020                CREATININE               0.72                05/13/2021           Lab Results      Component                Value               Date                      VD25OH                   40.5                11/14/2020  VD25OH                   35                  08/14/2011            BP Readings from Last 3 Encounters: 09/02/21 : 130/76 08/11/21 : 106/70 07/29/21 : 120/76

## 2021-09-12 ENCOUNTER — Other Ambulatory Visit: Payer: Self-pay | Admitting: Family Medicine

## 2021-09-15 MED ORDER — RIVAROXABAN 20 MG PO TABS: 20.0000 mg | ORAL_TABLET | Freq: Every day | ORAL | 0 refills | Status: DC

## 2021-09-15 NOTE — Addendum Note (Signed)
Addended by: Denita Lung on: 09/15/2021 01:14 PM   Modules accepted: Orders

## 2021-09-17 ENCOUNTER — Encounter: Payer: Self-pay | Admitting: Family Medicine

## 2021-09-23 ENCOUNTER — Ambulatory Visit (INDEPENDENT_AMBULATORY_CARE_PROVIDER_SITE_OTHER): Payer: BC Managed Care – PPO | Admitting: Adult Health

## 2021-09-23 ENCOUNTER — Encounter (INDEPENDENT_AMBULATORY_CARE_PROVIDER_SITE_OTHER): Payer: Self-pay | Admitting: Adult Health

## 2021-09-23 ENCOUNTER — Other Ambulatory Visit: Payer: Self-pay

## 2021-09-23 VITALS — BP 117/71 | HR 69 | Temp 98.0°F | Ht 71.0 in | Wt 221.0 lb

## 2021-09-23 DIAGNOSIS — F419 Anxiety disorder, unspecified: Secondary | ICD-10-CM

## 2021-09-23 DIAGNOSIS — E1169 Type 2 diabetes mellitus with other specified complication: Secondary | ICD-10-CM | POA: Diagnosis not present

## 2021-09-23 DIAGNOSIS — F32A Depression, unspecified: Secondary | ICD-10-CM

## 2021-09-23 DIAGNOSIS — Z6837 Body mass index (BMI) 37.0-37.9, adult: Secondary | ICD-10-CM

## 2021-09-23 NOTE — Progress Notes (Signed)
Chief Complaint:   OBESITY Laurie Mckinney is here to discuss her progress with her obesity treatment plan along with follow-up of her obesity related diagnoses. Laurie Mckinney is on the Category 3 Plan and states she is following her eating plan approximately 50% of the time. Laurie Mckinney states she is walking the dog for 20 minutes 7 times per week.  Today's visit was #: 18 Starting weight: 268 lbs Starting date: 11/14/2020 Today's weight: 221 lbs Today's date: 09/23/2021 Total lbs lost to date: 47 lbs Total lbs lost since last in-office visit: 3 lbs  Interim History:  Laurie Mckinney reports increased sx's of depression-related to her son Laurie Mckinney) and daughter-in-law's Laurie Mckinney) holiday visit, specifically change in plans for their holiday visit. She reached out to her PCP with concerns and Dr. Redmond School increased Wellbutrin SR 100 mg from 1 tab to 2 tabs daily, recommended f/u in 2 weeks.  Bioimpedance results MM > FM - 120 > 94.  Visceral adipose rating 11 - at goal.  BMI - 30.8.  Of note:  Husband, Elta [place].  Netta Cedars - [place].  Azzie Glatter - [place].  Subjective:   1. Type 2 diabetes mellitus with other specified complication, without long-term current use of insulin (HCC) She is on Ozempic 1 mg, denies mass in neck, dysphagia, dyspepsia, persistent hoarseness, or GI upset. She reports stable appetite.  2. Anxiety and depression Laurie Mckinney reports increased sx's of depression-related to her son Laurie Mckinney) and daughter-in-law's Laurie Mckinney) holiday visit, specifically change in plans for their holiday visit. She reached out to her PCP with concerns and Dr. Redmond School increased Wellbutrin SR 100 mg from 1 tab to 2 tabs daily, recommended f/u in 2 weeks. She denies SI/HI. She is not currently working with psychologist and agreeable to referral today.  Assessment/Plan:   1. Type 2 diabetes mellitus with other specified complication, without long-term current use of insulin (HCC) Continue on Ozempic 1 mg.  No  need for refill.  2. Anxiety and depression Referral to BH/Psychology. Continue with current increased Wellbutrin dose, continue regular walking.  - Ambulatory referral to Psychology  3. Obesity with current BMI 30.8  Laurie Mckinney is currently in the action stage of change. As such, her goal is to continue with weight loss efforts. She has agreed to the Category 3 Plan.   Exercise goals:  As is.  Behavioral modification strategies: increasing lean protein intake, decreasing simple carbohydrates, meal planning and cooking strategies, keeping healthy foods in the home, and planning for success.  Wing has agreed to follow-up with our clinic in 4 weeks. She was informed of the importance of frequent follow-up visits to maximize her success with intensive lifestyle modifications for her multiple health conditions.   Objective:   Blood pressure 117/71, pulse 69, temperature 98 F (36.7 C), height 5\' 11"  (1.803 m), weight 221 lb (100.2 kg), last menstrual period 01/26/2011, SpO2 99 %. Body mass index is 30.82 kg/m.  General: Cooperative, alert, well developed, in no acute distress. HEENT: Conjunctivae and lids unremarkable. Cardiovascular: Regular rhythm.  Lungs: Normal work of breathing. Neurologic: No focal deficits.   Lab Results  Component Value Date   CREATININE 0.72 05/13/2021   BUN 17 05/13/2021   NA 138 05/13/2021   K 4.8 05/13/2021   CL 99 05/13/2021   CO2 20 05/13/2021   Lab Results  Component Value Date   ALT 14 05/13/2021   AST 18 05/13/2021   ALKPHOS 92 05/13/2021   BILITOT 0.5 05/13/2021   Lab Results  Component Value  Date   HGBA1C 5.8 (A) 06/20/2021   HGBA1C 6.1 (H) 05/13/2021   HGBA1C 6.4 (A) 02/10/2021   HGBA1C 6.7 (H) 11/14/2020   HGBA1C 6.5 (A) 01/15/2020   Lab Results  Component Value Date   INSULIN 9.7 05/13/2021   INSULIN 4.0 11/14/2020   Lab Results  Component Value Date   TSH 0.724 11/14/2020   Lab Results  Component Value Date   CHOL  135 11/14/2020   HDL 62 11/14/2020   LDLCALC 62 11/14/2020   TRIG 45 11/14/2020   CHOLHDL 2.6 10/05/2019   Lab Results  Component Value Date   VD25OH 40.5 11/14/2020   VD25OH 35 08/14/2011   Lab Results  Component Value Date   WBC 7.8 11/14/2020   HGB 14.5 11/14/2020   HCT 43.2 11/14/2020   MCV 92 11/14/2020   PLT 289 11/14/2020   Attestation Statements:   Reviewed by clinician on day of visit: allergies, medications, problem list, medical history, surgical history, family history, social history, and previous encounter notes.  I, Water quality scientist, CMA, am acting as Location manager for Mina Marble, NP.  I have reviewed the above documentation for accuracy and completeness, and I agree with the above. - Rozalynn Buege d. Yarianna Varble, NP-C

## 2021-10-12 ENCOUNTER — Other Ambulatory Visit: Payer: Self-pay | Admitting: Family Medicine

## 2021-10-12 DIAGNOSIS — R32 Unspecified urinary incontinence: Secondary | ICD-10-CM

## 2021-10-21 ENCOUNTER — Ambulatory Visit (INDEPENDENT_AMBULATORY_CARE_PROVIDER_SITE_OTHER): Payer: BC Managed Care – PPO | Admitting: Adult Health

## 2021-10-22 ENCOUNTER — Encounter (INDEPENDENT_AMBULATORY_CARE_PROVIDER_SITE_OTHER): Payer: Self-pay | Admitting: Adult Health

## 2021-10-22 ENCOUNTER — Other Ambulatory Visit: Payer: Self-pay

## 2021-10-22 ENCOUNTER — Ambulatory Visit (INDEPENDENT_AMBULATORY_CARE_PROVIDER_SITE_OTHER): Payer: BC Managed Care – PPO | Admitting: Adult Health

## 2021-10-22 VITALS — BP 120/82 | HR 101 | Temp 98.3°F | Ht 71.0 in | Wt 219.0 lb

## 2021-10-22 DIAGNOSIS — I152 Hypertension secondary to endocrine disorders: Secondary | ICD-10-CM

## 2021-10-22 DIAGNOSIS — F32A Depression, unspecified: Secondary | ICD-10-CM

## 2021-10-22 DIAGNOSIS — F419 Anxiety disorder, unspecified: Secondary | ICD-10-CM | POA: Diagnosis not present

## 2021-10-22 DIAGNOSIS — Z6837 Body mass index (BMI) 37.0-37.9, adult: Secondary | ICD-10-CM

## 2021-10-22 DIAGNOSIS — Z6830 Body mass index (BMI) 30.0-30.9, adult: Secondary | ICD-10-CM

## 2021-10-22 DIAGNOSIS — E1169 Type 2 diabetes mellitus with other specified complication: Secondary | ICD-10-CM

## 2021-10-22 DIAGNOSIS — E1159 Type 2 diabetes mellitus with other circulatory complications: Secondary | ICD-10-CM

## 2021-10-22 NOTE — Progress Notes (Signed)
Chief Complaint:   OBESITY Laurie Mckinney is here to discuss her progress with her obesity treatment plan along with follow-up of her obesity related diagnoses. Laurie Mckinney is on the Category 3 Plan and states she is following her eating plan approximately 25% of the time. Laurie Mckinney states she is walking for 30 minutes 2 times per week.  Today's visit was #: 62 Starting weight: 268 lbs Starting date: 11/14/2020 Today's weight: 219 lbs Today's date: 10/22/2021 Total lbs lost to date: 49 lbs Total lbs lost since last in-office visit: 2 lbs  Interim History:  Laurie Mckinney's 2023 health/wellness goals: 1) Increase physical activity. 2) Lose another 20 pounds, current weight 219 pounds. 3) Decrease medications- as appropriate.  Subjective:   1. Hypertension associated with diabetes [facility]) BP/HR excellent at office visit. She is on Zestoretic 20/25 mg daily.  2. Type 2 diabetes mellitus with other specified complication, without long-term current use of insulin ([place]) She reports increased carbohydrate cravings the last month. Category 3 meal plan compliance = 25%. She is on Ozempic 1 mg once week- denies mass in neck, dysphagia, dyspepsia, persistent hoarseness, or GI upset.  3. Anxiety and depression Her PCP/Dr. Redmond School increased bupropion SR from 100mg  to 200mg  QD, continue her on  paroxetine 20 mg daily.   She has scheduled an initial appointment with [facility] [place] - November 04, 2021, 1:00 pm. She reports stable mood, denies SI/HI.  Assessment/Plan:   1. Hypertension associated with diabetes ([place]) Continue daily Zestoretic 20/25 mg daily.  2. Type 2 diabetes mellitus with other specified complication, without long-term current use of insulin (HCC) Increase Category 3 meal plan compliance to at least 80%. If polyphagia continues after lifestyle modification- consider increasing GLP-1 therapy in future.  3. Anxiety and depression Continue current medication  regimen and new patient appointment with Kir[facility] Sh[facility]. Follow-up with Dr. Redmond School - re: bupropion dosage.  4. Obesity with current BMI 30.6  Laurie Mckinney is currently in the action stage of change. As such, her goal is to continue with weight loss efforts. She has agreed to the Category 3 Plan.   Exercise goals:  As is.  Behavioral modification strategies: increasing lean protein intake, decreasing simple carbohydrates, meal planning and cooking strategies, keeping healthy foods in the home, and planning for success.  Laurie Mckinney has agreed to follow-up with our clinic in 4 weeks. She was informed of the importance of frequent follow-up visits to maximize her success with intensive lifestyle modifications for her multiple health conditions.   Objective:   Blood pressure 120/82, pulse (!) 101, temperature 98.3 F (36.8 C), height 5\' 11"  (1.803 m), weight 219 lb (99.3 kg), last menstrual period 01/26/2011, SpO2 98 %. Body mass index is 30.54 kg/m.  General: Cooperative, alert, well developed, in no acute distress. HEENT: Conjunctivae and lids unremarkable. Cardiovascular: Regular rhythm.  Lungs: Normal work of breathing. Neurologic: No focal deficits.   Lab Results  Component Value Date   CREATININE 0.72 05/13/2021   BUN 17 05/13/2021   NA 138 05/13/2021   K 4.8 05/13/2021   CL 99 05/13/2021   CO2 20 05/13/2021   Lab Results  Component Value Date   ALT 14 05/13/2021   AST 18 05/13/2021   ALKPHOS 92 05/13/2021   BILITOT 0.5 05/13/2021   Lab Results  Component Value Date   HGBA1C 5.8 (A) 06/20/2021   HGBA1C 6.1 (H) 05/13/2021   HGBA1C 6.4 (A) 02/10/2021   HGBA1C 6.7 (H) 11/14/2020   HGBA1C 6.5 (A)  01/15/2020   Lab Results  Component Value Date   INSULIN 9.7 05/13/2021   INSULIN 4.0 11/14/2020   Lab Results  Component Value Date   TSH 0.724 11/14/2020   Lab Results  Component Value Date   CHOL 135 11/14/2020   HDL 62 11/14/2020   LDLCALC 62 11/14/2020   TRIG 45  11/14/2020   CHOLHDL 2.6 10/05/2019   Lab Results  Component Value Date   VD25OH 40.5 11/14/2020   VD25OH 35 08/14/2011   Lab Results  Component Value Date   WBC 7.8 11/14/2020   HGB 14.5 11/14/2020   HCT 43.2 11/14/2020   MCV 92 11/14/2020   PLT 289 11/14/2020   Attestation Statements:   Reviewed by clinician on day of visit: allergies, medications, problem list, medical history, surgical history, family history, social history, and previous encounter notes.  Time spent on visit including pre-visit chart review and post-visit care and charting was 28 minutes.   I, Water quality scientist, CMA, am acting as Location manager for Mina Marble, NP.  I have reviewed the above documentation for accuracy and completeness, and I agree with the above. -  Gale Klar d. Arloa Prak, NP-C

## 2021-10-24 ENCOUNTER — Encounter: Payer: Self-pay | Admitting: Family Medicine

## 2021-10-24 DIAGNOSIS — F341 Dysthymic disorder: Secondary | ICD-10-CM

## 2021-10-24 MED ORDER — BUPROPION HCL ER (SR) 200 MG PO TB12: 200.0000 mg | ORAL_TABLET | Freq: Every day | ORAL | 1 refills | Status: DC

## 2021-10-25 ENCOUNTER — Other Ambulatory Visit: Payer: Self-pay | Admitting: Family Medicine

## 2021-10-25 DIAGNOSIS — F341 Dysthymic disorder: Secondary | ICD-10-CM

## 2021-10-27 NOTE — Telephone Encounter (Signed)
[facility] is requesting to fill pt pt wellbutrin. Please advised kH

## 2021-11-04 ENCOUNTER — Ambulatory Visit (INDEPENDENT_AMBULATORY_CARE_PROVIDER_SITE_OTHER): Payer: BC Managed Care – PPO | Admitting: Psychologist

## 2021-11-04 DIAGNOSIS — F320 Major depressive disorder, single episode, mild: Secondary | ICD-10-CM | POA: Diagnosis not present

## 2021-11-04 NOTE — Progress Notes (Signed)
                Laurie Sarabia, PsyD 

## 2021-11-04 NOTE — Progress Notes (Signed)
[place] Counselor Initial Adult Exam  Name: Laurie Mckinney Date: 11/04/2021 MRN: [id number] DOB: 1961/07/14 PCP: Denita Lung, MD  Time spent: 1:02 pm to 1:36 pm; total time: 34 minutes  This session was held via video webex teletherapy due to the coronavirus risk at this time. The patient consented to video teletherapy and was located at her home during this session. She is aware it is the responsibility of the patient to secure confidentiality on her end of the session. The provider was in a private home office for the duration of this session. Limits of confidentiality were discussed with the patient.   Guardian/Payee:  No    Paperwork requested: No   Reason for Visit /Presenting Problem: Depression  Mental Status Exam: Appearance:   Casual     Behavior:  Appropriate  Motor:  Normal  Speech/Language:   Clear and Coherent  Affect:  Appropriate  Mood:  normal  Thought process:  normal  Thought content:    WNL  Sensory/Perceptual disturbances:    WNL  Orientation:  oriented to person, place, and time/date  Attention:  Good  Concentration:  Good  Memory:  WNL  Fund of knowledge:   Good  Insight:    Fair  Judgment:   Fair  Impulse Control:  Good   Reported Symptoms:  The patient endorsed experiencing the following: feeling sad, down, lack of motivation, social isolation, fatigue, avoiding pleasurable activities, and rumination of negative thoughts. She denied suicidal and homicidal ideation.   Risk Assessment: Danger to Self:  No Self-injurious Behavior: No Danger to Others: No Duty to Warn:no Physical Aggression / Violence:No  Access to Firearms a concern: No  Gang Involvement:No  Patient / guardian was educated about steps to take if suicide or homicide risk level increases between visits: n/a While future psychiatric events cannot be accurately predicted, the patient does not currently require acute inpatient psychiatric care and does not  currently meet [facility] involuntary commitment criteria.  Substance Abuse History: Current substance abuse: No     Past Psychiatric History:   Previous psychological history is significant for depression Outpatient Providers:NA History of Psych Hospitalization: No  Psychological Testing:  NA    Abuse History:  Victim of: No.,  NA    Report needed: No. Victim of Neglect:No. Perpetrator of  NA   Witness / Exposure to Domestic Violence: No   Protective Services Involvement: No  Witness to [employer] Violence:  No   Family History:  Family History  Problem Relation Age of Onset   Arthritis Mother    Heart disease Father    Hypertension Father    Hyperlipidemia Father    Sudden death Father    Neuropathy Brother        both legs    Diabetes Brother        no treatment that the pt knows of    Other Brother        drank a 6 pack of beer daily x 30 years    Sleep apnea Neg Hx     Living situation: the patient lives with their spouse  Sexual Orientation: Straight  Relationship Status: married  Name of spouse / other:Mark If a parent, number of children / ages:Patient has one son.   Support Systems: spouse  Museum/gallery curator Stress:  No   Income/Employment/Disability: Actor: No   Educational History: Education: some college  Religion/Sprituality/World View: Christian  Any cultural differences that may affect / interfere  with treatment:  not applicable   Recreation/Hobbies: Spending time with family  Stressors: Marital or family conflict    Strengths: Supportive Relationships  Barriers:  NA   Legal History: Pending legal issue / charges: The patient has no significant history of legal issues. History of legal issue / charges:  NA  Medical History/Surgical History: reviewed Past Medical History:  Diagnosis Date   Adjustment insomnia    SHIFT WORK   Allergy    RHINITIS   Asthma    Atrial flutter ([place]) 02/26/15    [facility]   Back pain    Bursitis    right hip   Cataract    see last eye eexam note    Constipation    Depression    Diabetes mellitus without complication (HCC)    History of atrial fibrillation    History of cancer    Hypertension    Incontinence    Joint pain    Migraine headache    Mobitz type 2 second degree heart block    Neuropathy    Obesity    Other fatigue    Shortness of breath on exertion    Sleep apnea     Past Surgical History:  Procedure Laterality Date   ABDOMINAL HYSTERECTOMY     ATRIAL FIBRILLATION ABLATION N/A 10/27/2019   Procedure: ATRIAL FIBRILLATION ABLATION;  Surgeon: Constance Haw, MD;  Location: [facility];  Service: Cardiovascular;  Laterality: N/A;   CARDIOVERSION N/A 05/25/2019   Procedure: CARDIOVERSION;  Surgeon: Arnoldo Lenis, MD;  Location: [facility];  Service: Endoscopy;  Laterality: N/A;   COLONOSCOPY  2011   Dr.Brodie   ROBOTIC ASSISTED TOTAL HYSTERECTOMY WITH BILATERAL SALPINGO OOPHERECTOMY Bilateral 02/05/2015   Procedure: ROBOTIC ASSISTED TOTAL LAPAROSCOPIC HYSTERECTOMY WITH BILATERAL SALPINGO OOPHORECTOMY;  Surgeon: Everitt Amber, MD;  Location: [facility];  Service: Gynecology;  Laterality: Bilateral;    Medications: Current Outpatient Medications  Medication Sig Dispense Refill   acetaminophen (TYLENOL) 650 MG CR tablet Take 650 mg by mouth every 8 (eight) hours as needed for pain.     atorvastatin (LIPITOR) 10 MG tablet TAKE 1 TABLET DAILY 90 tablet 1   buPROPion (WELLBUTRIN SR) 200 MG 12 hr tablet Take 1 tablet (200 mg total) by mouth daily. 90 tablet 1   gabapentin (NEURONTIN) 100 MG capsule Take 2 capsules in AM and 2 capsules at bedtime 360 capsule 3   Lancets (ONETOUCH DELICA PLUS [id number]) MISC USE AND DISCARD 1 LANCET   DAILY 100 each 3   lisinopril-hydrochlorothiazide (ZESTORETIC) 20-25 MG tablet TAKE 1 TABLET DAILY (DOSE  INCREASE) 90 tablet 1   Misc Natural Products (IMMUNE FORMULA PO) Take 2  tablets by mouth daily. immuneti     Multiple Vitamins-Minerals (CENTRUM SILVER 50+WOMEN) TABS Take 1 tablet by mouth daily.     mupirocin ointment (BACTROBAN) 2 % Apply topically 2 (two) times daily. 15 g 0   Omega-3 Fatty Acids (FISH OIL) 1200 MG CAPS Take 2 capsules by mouth at bedtime.     ONETOUCH VERIO test strip USE AND DISCARD 1 TEST     STRIP AS NEEDED AS         INSTRUCTED 100 each 3   oxybutynin (DITROPAN-XL) 10 MG 24 hr tablet TAKE 1 TABLET AT BEDTIME 90 tablet 1   PARoxetine (PAXIL) 20 MG tablet TAKE 1 TABLET BY MOUTH EVERY DAY 90 tablet 1   rivaroxaban (XARELTO) 20 MG TABS tablet Take 1 tablet (20 mg total)  by mouth daily with supper. 90 tablet 0   Semaglutide, 1 MG/DOSE, 4 MG/3ML SOPN Inject 1 mg as directed once a week. 9 mL 0   UNABLE TO FIND Take 1 tablet by mouth Nightly. Med Name: Theotis Burrow     Vibegron (GEMTESA) 75 MG TABS Take by mouth at bedtime.     No current facility-administered medications for this visit.    No Known Allergies  Diagnoses:  F32.0 major depressive disorder, single episode, mild  Plan of Care: The patient is a 61 year old Caucasian female who was referred due to experiencing depressive based symptoms. The patient lives at home with her spouse. The patient meets criteria for a diagnosis of F32.0 major depressive affective disorder, single episode, mild based off of the following: feeling sad, down, lack of motivation, social isolation, fatigue, avoiding pleasurable activities, and rumination of negative thoughts. She denied suicidal and homicidal ideation.   The patient voiced wanting coping skills.  This patient recommends that the patient participates in bi-weekly therapy.    Conception Chancy, PsyD

## 2021-11-04 NOTE — Plan of Care (Signed)

## 2021-11-12 DIAGNOSIS — N3946 Mixed incontinence: Secondary | ICD-10-CM | POA: Diagnosis not present

## 2021-11-19 ENCOUNTER — Other Ambulatory Visit: Payer: Self-pay

## 2021-11-19 ENCOUNTER — Encounter (INDEPENDENT_AMBULATORY_CARE_PROVIDER_SITE_OTHER): Payer: Self-pay | Admitting: Adult Health

## 2021-11-19 ENCOUNTER — Ambulatory Visit (INDEPENDENT_AMBULATORY_CARE_PROVIDER_SITE_OTHER): Payer: BC Managed Care – PPO | Admitting: Adult Health

## 2021-11-19 VITALS — BP 115/76 | HR 66 | Temp 98.4°F | Ht 71.0 in | Wt 218.0 lb

## 2021-11-19 DIAGNOSIS — E669 Obesity, unspecified: Secondary | ICD-10-CM | POA: Diagnosis not present

## 2021-11-19 DIAGNOSIS — I152 Hypertension secondary to endocrine disorders: Secondary | ICD-10-CM

## 2021-11-19 DIAGNOSIS — E1159 Type 2 diabetes mellitus with other circulatory complications: Secondary | ICD-10-CM

## 2021-11-19 DIAGNOSIS — E1169 Type 2 diabetes mellitus with other specified complication: Secondary | ICD-10-CM

## 2021-11-19 DIAGNOSIS — Z7985 Long-term (current) use of injectable non-insulin antidiabetic drugs: Secondary | ICD-10-CM

## 2021-11-19 DIAGNOSIS — Z6837 Body mass index (BMI) 37.0-37.9, adult: Secondary | ICD-10-CM

## 2021-11-19 DIAGNOSIS — Z6830 Body mass index (BMI) 30.0-30.9, adult: Secondary | ICD-10-CM

## 2021-11-19 NOTE — Progress Notes (Signed)
Chief Complaint:   OBESITY Shamieka is here to discuss her progress with her obesity treatment plan along with follow-up of her obesity related diagnoses. Kaelene is on the Category 3 Plan and states she is following her eating plan approximately 25% of the time. Dayanara states she is walking for 30 minutes 2 times per week.  Today's visit was #: 20 Starting weight: 268 lbs Starting date: 11/14/2020 Today's weight: 218 lbs Today's date: 11/19/2021 Total lbs lost to date: 50 lbs Total lbs lost since last in-office visit: 1 lb  Interim History:  Yue has been cooking meals "off plan". She is craving flavor/texture in her lunch and dinner meals. Currently fitting size 18, previously >20/plus.  Subjective:   1. Type 2 diabetes mellitus with other specified complication, without long-term current use of insulin (HCC) Fasting BG 110-120. She is on Ozempic 1 mg - she has been experiencing belching, increased hunger levels. She denies mass in neck, dysphagia, persistent hoarseness, abd pain, or N/V.  2. Hypertension associated with diabetes ([place]) BP/HR at goal at office visit. She is on lisinopril/HCTZ 20/25 mg daily.  Assessment/Plan:   1. Type 2 diabetes mellitus with other specified complication, without long-term current use of insulin (HCC) Increase protein at each meal to reduce CHO cravings. Check A1c at next office visit.  2. Hypertension associated with diabetes ([place]) Continue daily antihypertensive regimen.  3. Obesity with current BMI 30.4  Akelia is currently in the action stage of change. As such, her goal is to continue with weight loss efforts. She has agreed to the Category 3 Plan with 350-500 calories and 35 grams of protein at lunch and 450-600 calories and 40 grams of protein at dinner.   Check fasting labs at next office visit.  Exercise goals:  Increase daily walking.  Behavioral modification strategies: increasing lean protein intake,  decreasing simple carbohydrates, meal planning and cooking strategies, keeping healthy foods in the home, and planning for success.  Reylynn has agreed to follow-up with our clinic in 4 weeks, fasting. She was informed of the importance of frequent follow-up visits to maximize her success with intensive lifestyle modifications for her multiple health conditions.   Objective:   Blood pressure 115/76, pulse 66, temperature 98.4 F (36.9 C), height 5\' 11"  (1.803 m), weight 218 lb (98.9 kg), last menstrual period 01/26/2011, SpO2 99 %. Body mass index is 30.4 kg/m.  General: Cooperative, alert, well developed, in no acute distress. HEENT: Conjunctivae and lids unremarkable. Cardiovascular: Regular rhythm.  Lungs: Normal work of breathing. Neurologic: No focal deficits.   Lab Results  Component Value Date   CREATININE 0.72 05/13/2021   BUN 17 05/13/2021   NA 138 05/13/2021   K 4.8 05/13/2021   CL 99 05/13/2021   CO2 20 05/13/2021   Lab Results  Component Value Date   ALT 14 05/13/2021   AST 18 05/13/2021   ALKPHOS 92 05/13/2021   BILITOT 0.5 05/13/2021   Lab Results  Component Value Date   HGBA1C 5.8 (A) 06/20/2021   HGBA1C 6.1 (H) 05/13/2021   HGBA1C 6.4 (A) 02/10/2021   HGBA1C 6.7 (H) 11/14/2020   HGBA1C 6.5 (A) 01/15/2020   Lab Results  Component Value Date   INSULIN 9.7 05/13/2021   INSULIN 4.0 11/14/2020   Lab Results  Component Value Date   TSH 0.724 11/14/2020   Lab Results  Component Value Date   CHOL 135 11/14/2020   HDL 62 11/14/2020   LDLCALC 62 11/14/2020  TRIG 45 11/14/2020   CHOLHDL 2.6 10/05/2019   Lab Results  Component Value Date   VD25OH 40.5 11/14/2020   VD25OH 35 08/14/2011   Lab Results  Component Value Date   WBC 7.8 11/14/2020   HGB 14.5 11/14/2020   HCT 43.2 11/14/2020   MCV 92 11/14/2020   PLT 289 11/14/2020   Attestation Statements:   Reviewed by clinician on day of visit: allergies, medications, problem list, medical  history, surgical history, family history, social history, and previous encounter notes.  Time spent on visit including pre-visit chart review and post-visit care and charting was 28 minutes.   I, Water quality scientist, CMA, am acting as transcriptionist for Briscoe Deutscher, DO  I have reviewed the above documentation for accuracy and completeness, and I agree with the above. -  Jakki Doughty d. Alec Jaros, NP-C

## 2021-11-26 ENCOUNTER — Ambulatory Visit (INDEPENDENT_AMBULATORY_CARE_PROVIDER_SITE_OTHER): Payer: BC Managed Care – PPO | Admitting: Psychologist

## 2021-11-26 DIAGNOSIS — F320 Major depressive disorder, single episode, mild: Secondary | ICD-10-CM | POA: Diagnosis not present

## 2021-11-26 NOTE — Progress Notes (Signed)
[facility] Counselor/Therapist Progress Note  Patient ID: Laurie Mckinney, MRN: [id number],    Date: 11/26/2021  Time Spent: 8:02 am to 8:42 am; total time: 40 minutes  This session was held via video webex teletherapy due to the coronavirus risk at this time. The patient consented to video teletherapy and was located at her home during this session. She is aware it is the responsibility of the patient to secure confidentiality on her end of the session. The provider was in a private home office for the duration of this session. Limits of confidentiality were discussed with the patient.   Treatment Type: Individual Therapy  Reported Symptoms: Depression  Mental Status Exam: Appearance:  Casual     Behavior: Appropriate  Motor: Normal  Speech/Language:  Clear and Coherent  Affect: Appropriate  Mood: normal  Thought process: normal  Thought content:   WNL  Sensory/Perceptual disturbances:   WNL  Orientation: oriented to person, place, and time/date  Attention: Good  Concentration: Good  Memory: WNL  Fund of knowledge:  Good  Insight:   Fair  Judgment:  Fair  Impulse Control: Good   Risk Assessment: Danger to Self:  No Self-injurious Behavior: No Danger to Others: No Duty to Warn:no Physical Aggression / Violence:No  Access to Firearms a concern: No  Gang Involvement:No   Subjective: After reviewing the treatment plan, patient described herself as doing well while reflecting on events since the intake. Per the patient, she stated that she wanted to work on identifying self-care activities she can implement. Patient spent time identifying self-care activities, and then processed how to prioritize the different self-care activities. She developed a SMART goal. She processed barriers and how to overcome those barriers. At the end, patient stated not understanding her values and wanting to better understand them. She was agreeable to homework and following up. She  denied suicidal and homicidal ideation.    Interventions:  Worked on developing a therapeutic relationship with the patient using active listening and reflective statements. Provided emotional support using empathy and validation. Reviewed the treatment plan with the patient. Reviewed events since the intake. Praised patient for doing better and explored what has assisted the patient. Identified goals for the session. Explored what self-care activities patient wanted to implement. Used socratic questions to assist the patient gain insight into self. Assisted in problem solving. Normalized and validated expressed thoughts and emotions. Assisted the patient in developing SMART goals. Explored potential barriers and how to overcome them. Processed the balance of caring for others, while also caring for self. Briefly introduced the concept of looking at values. Provided empathic statements. Assigned homework. Assessed for suicidal and homicidal ideation.  Homework: Complete accent wall; review handout on values  Next Session: Review homework and emotional support.   Diagnosis: F32.0 major depressive affective disorder, single episode, mild   Plan:   Client Abilities: Friendly and easy to develop rapport  Client Preferences: ACT and CBT  Client statement of Needs: Coping skills and emotional support  Treatment Level: Outpatient   Goals Alleviate depressive symptoms Recognize, accept, and cope with depressive feelings Develop healthy thinking patterns Develop healthy interpersonal relationships  Objectives target date for all objectives is 11/04/2022 Cooperate with a medication evaluation by a physician Verbalize an accurate understanding of depression Verbalize an understanding of the treatment Identify and replace thoughts that support depression Learn and implement behavioral strategies Verbalize an understanding and resolution of current interpersonal problems Learn and implement  problem solving and decision making skills Learn  and implement conflict resolution skills to resolve interpersonal problems Verbalize an understanding of healthy and unhealthy emotions verbalize insight into how past relationships may be influence current experiences with depression Use mindfulness and acceptance strategies and increase value based behavior  Increase hopeful statements about the future.  Interventions Consistent with treatment model, discuss how change in cognitive, behavioral, and interpersonal can help client alleviate depression CBT Behavioral activation help the client explore the relationship, nature of the dispute,  Help the client develop new interpersonal skills and relationships Conduct Problem so living therapy Teach conflict resolution skills Use a process-experiential approach Conduct TLDP Conduct ACT Evaluate need for psychotropic medication Monitor adherence to medication  The patient and clinician reviewed the treatment plan on 11/26/2021. The patient approved of the treatment plan.   Conception Chancy, PsyD

## 2021-11-26 NOTE — Progress Notes (Signed)
                Laurie Vanpelt, PsyD 

## 2021-12-02 ENCOUNTER — Ambulatory Visit: Payer: BC Managed Care – PPO | Admitting: Plastic Surgery

## 2021-12-02 ENCOUNTER — Other Ambulatory Visit: Payer: Self-pay

## 2021-12-02 ENCOUNTER — Encounter: Payer: Self-pay | Admitting: Plastic Surgery

## 2021-12-02 VITALS — BP 112/72 | HR 73 | Ht 71.0 in | Wt 224.8 lb

## 2021-12-02 DIAGNOSIS — N6489 Other specified disorders of breast: Secondary | ICD-10-CM

## 2021-12-02 DIAGNOSIS — G4733 Obstructive sleep apnea (adult) (pediatric): Secondary | ICD-10-CM

## 2021-12-02 DIAGNOSIS — Z8669 Personal history of other diseases of the nervous system and sense organs: Secondary | ICD-10-CM | POA: Diagnosis not present

## 2021-12-02 DIAGNOSIS — Z8542 Personal history of malignant neoplasm of other parts of uterus: Secondary | ICD-10-CM | POA: Diagnosis not present

## 2021-12-02 DIAGNOSIS — E669 Obesity, unspecified: Secondary | ICD-10-CM | POA: Diagnosis not present

## 2021-12-02 DIAGNOSIS — M545 Low back pain, unspecified: Secondary | ICD-10-CM

## 2021-12-02 DIAGNOSIS — E1142 Type 2 diabetes mellitus with diabetic polyneuropathy: Secondary | ICD-10-CM

## 2021-12-02 DIAGNOSIS — I4819 Other persistent atrial fibrillation: Secondary | ICD-10-CM

## 2021-12-02 DIAGNOSIS — Z6837 Body mass index (BMI) 37.0-37.9, adult: Secondary | ICD-10-CM

## 2021-12-02 DIAGNOSIS — N62 Hypertrophy of breast: Secondary | ICD-10-CM | POA: Insufficient documentation

## 2021-12-02 DIAGNOSIS — G8929 Other chronic pain: Secondary | ICD-10-CM

## 2021-12-02 DIAGNOSIS — D6869 Other thrombophilia: Secondary | ICD-10-CM | POA: Diagnosis not present

## 2021-12-02 NOTE — Progress Notes (Signed)
Patient ID: Laurie Mckinney, female    DOB: Dec 04, 1960, 61 y.o.   MRN: [id number]   Chief Complaint  Patient presents with   Advice Only   Breast Problem    Mammary Hyperplasia: The patient is a 61 y.o. female with a history of mammary hyperplasia for several years.  She has extremely large breasts causing symptoms that include the following: Back pain in the upper and lower back, including neck pain. She pulls or pins her bra straps to provide better lift and relief of the pressure and pain. She notices relief by holding her breast up manually.  Her shoulder straps cause grooves and pain and pressure that requires padding for relief. Pain medication is sometimes required with motrin and tylenol.  Activities that are hindered by enlarged breasts include: exercise and running.  She has tried supportive clothing as well as fitted bras without improvement.  Her breasts are extremely large and fairly symmetric.  She has hyperpigmentation of the inframammary area on both sides.  The sternal to nipple distance on the right is 38 cm and the left is 39 cm.  The IMF distance is 16 cm.  She is 5 feet 11 inches tall and weighs 218 pounds.  The BMI = 31.2 kg/m.  Preoperative bra size = DDD cup.  She would like to be a full C / D.  The estimated excess breast tissue to be removed at the time of surgery = 750 grams on the left and 750 grams on the right.  Mammogram history: negative in the past but due .  Family history of breast cancer:  no.  Tobacco use:  no.   The patient expresses the desire to pursue surgical intervention.  She is on Xarelto for atrial fib.  She has done PT before for balance issues.  She has lost 40 pounds in the past year.    Review of Systems  Constitutional:  Positive for activity change. Negative for appetite change.  Eyes: Negative.   Respiratory: Negative.  Negative for chest tightness and shortness of breath.   Cardiovascular: Negative.   Gastrointestinal: Negative.    Endocrine: Negative.   Genitourinary: Negative.   Neurological: Negative.   Hematological: Negative.    Past Medical History:  Diagnosis Date   Adjustment insomnia    SHIFT WORK   Allergy    RHINITIS   Asthma    Atrial flutter ([place]) 02/26/15   [facility]   Back pain    Bursitis    right hip   Cataract    see last eye eexam note    Constipation    Depression    Diabetes mellitus without complication (HCC)    History of atrial fibrillation    History of cancer    Hypertension    Incontinence    Joint pain    Migraine headache    Mobitz type 2 second degree heart block    Neuropathy    Obesity    Other fatigue    Shortness of breath on exertion    Sleep apnea     Past Surgical History:  Procedure Laterality Date   ABDOMINAL HYSTERECTOMY     ATRIAL FIBRILLATION ABLATION N/A 10/27/2019   Procedure: ATRIAL FIBRILLATION ABLATION;  Surgeon: Constance Haw, MD;  Location: [facility];  Service: Cardiovascular;  Laterality: N/A;   CARDIOVERSION N/A 05/25/2019   Procedure: CARDIOVERSION;  Surgeon: Arnoldo Lenis, MD;  Location: [facility];  Service: Endoscopy;  Laterality: N/A;   COLONOSCOPY  2011   Dr.Brodie   ROBOTIC ASSISTED TOTAL HYSTERECTOMY WITH BILATERAL SALPINGO OOPHERECTOMY Bilateral 02/05/2015   Procedure: ROBOTIC ASSISTED TOTAL LAPAROSCOPIC HYSTERECTOMY WITH BILATERAL SALPINGO OOPHORECTOMY;  Surgeon: Everitt Amber, MD;  Location: [facility];  Service: Gynecology;  Laterality: Bilateral;      Current Outpatient Medications:    acetaminophen (TYLENOL) 650 MG CR tablet, Take 650 mg by mouth every 8 (eight) hours as needed for pain., Disp: , Rfl:    atorvastatin (LIPITOR) 10 MG tablet, TAKE 1 TABLET DAILY, Disp: 90 tablet, Rfl: 1   buPROPion (WELLBUTRIN SR) 200 MG 12 hr tablet, Take 1 tablet (200 mg total) by mouth daily., Disp: 90 tablet, Rfl: 1   gabapentin (NEURONTIN) 100 MG capsule, Take 2 capsules in AM and 2 capsules at bedtime, Disp: 360  capsule, Rfl: 3   Lancets (ONETOUCH DELICA PLUS CWCBJS28B) MISC, USE AND DISCARD 1 LANCET   DAILY, Disp: 100 each, Rfl: 3   lisinopril-hydrochlorothiazide (ZESTORETIC) 20-25 MG tablet, TAKE 1 TABLET DAILY (DOSE  INCREASE), Disp: 90 tablet, Rfl: 1   Misc Natural Products (IMMUNE FORMULA PO), Take 2 tablets by mouth daily. immuneti, Disp: , Rfl:    Multiple Vitamins-Minerals (CENTRUM SILVER 50+WOMEN) TABS, Take 1 tablet by mouth daily., Disp: , Rfl:    mupirocin ointment (BACTROBAN) 2 %, Apply topically 2 (two) times daily., Disp: 15 g, Rfl: 0   Omega-3 Fatty Acids (FISH OIL) 1200 MG CAPS, Take 2 capsules by mouth at bedtime., Disp: , Rfl:    ONETOUCH VERIO test strip, USE AND DISCARD 1 TEST     STRIP AS NEEDED AS         INSTRUCTED, Disp: 100 each, Rfl: 3   oxybutynin (DITROPAN-XL) 10 MG 24 hr tablet, TAKE 1 TABLET AT BEDTIME, Disp: 90 tablet, Rfl: 1   PARoxetine (PAXIL) 20 MG tablet, TAKE 1 TABLET BY MOUTH EVERY DAY, Disp: 90 tablet, Rfl: 1   rivaroxaban (XARELTO) 20 MG TABS tablet, Take 1 tablet (20 mg total) by mouth daily with supper., Disp: 90 tablet, Rfl: 0   Semaglutide, 1 MG/DOSE, 4 MG/3ML SOPN, Inject 1 mg as directed once a week., Disp: 9 mL, Rfl: 0   UNABLE TO FIND, Take 1 tablet by mouth Nightly. Med Name: Theotis Burrow, Disp: , Rfl:    Vibegron (GEMTESA) 75 MG TABS, Take by mouth at bedtime., Disp: , Rfl:    Objective:   Vitals:   12/02/21 1124  BP: 112/72  Pulse: 73  SpO2: 95%    Physical Exam Vitals reviewed.  Constitutional:      Appearance: Normal appearance.  HENT:     Head: Normocephalic and atraumatic.  Cardiovascular:     Rate and Rhythm: Normal rate.     Pulses: Normal pulses.  Pulmonary:     Effort: Pulmonary effort is normal. No respiratory distress.     Breath sounds: No wheezing.  Abdominal:     General: There is no distension.     Palpations: Abdomen is soft.  Musculoskeletal:        General: No swelling or deformity.  Skin:    General: Skin is warm.      Capillary Refill: Capillary refill takes less than 2 seconds.     Coloration: Skin is not jaundiced.     Findings: No bruising or lesion.  Neurological:     Mental Status: She is alert and oriented to person, place, and time.  Psychiatric:        Mood  and Affect: Mood normal.        Behavior: Behavior normal.        Thought Content: Thought content normal.        Judgment: Judgment normal.    Assessment & Plan:  Secondary hypercoagulable state ([place])  Obesity (BMI 30-39.9)  History of migraine headaches  History of endometrial cancer  Chronic bilateral low back pain without sciatica  Class 2 severe obesity with serious comorbidity and body mass index (BMI) of 37.0 to 37.9 in adult, unspecified obesity type (HCC)  Bilateral pendulous breasts  Diabetic polyneuropathy associated with type 2 diabetes mellitus (HCC)  OSA (obstructive sleep apnea)  Persistent atrial fibrillation (HCC)  Symptomatic mammary hypertrophy  The procedure the patient selected and that was best for the patient was discussed. The risk were discussed and include but not limited to the following:  Breast asymmetry, fluid accumulation, firmness of the breast, inability to breast feed, loss of nipple or areola, skin loss, change in skin and nipple sensation, fat necrosis of the breast tissue, bleeding, infection and healing delay.  There are risks of anesthesia and injury to nerves or blood vessels.  Allergic reaction to tape, suture and skin glue are possible.  There will be swelling.  Any of these can lead to the need for revisional surgery.  A breast reduction has potential to interfere with diagnostic procedures in the future.  This procedure is best done when the breast is fully developed.  Changes in the breast will continue to occur over time: pregnancy, weight gain or weigh loss.    Total time: 45 minutes. This includes time spent with the patient during the visit as well as time spent before and after the  visit reviewing the chart, documenting the encounter, ordering pertinent studies and literature for the patient.   Physical therapy:  ordered Mammogram:  ordered [facility]:  she is there now.  Pictures were obtained of the patient and placed in the chart with the patient's or guardian's permission.  Recommend bilateral breast reduction with liposuction. She knows to call after she has finished her PT. We will need to clear her from her PCP standpoint because she is on the Xarelto.  She will also need to be closer to her ideal weight.   [place], DO

## 2021-12-18 ENCOUNTER — Other Ambulatory Visit: Payer: Self-pay

## 2021-12-18 ENCOUNTER — Encounter: Payer: Self-pay | Admitting: Family Medicine

## 2021-12-18 ENCOUNTER — Ambulatory Visit: Payer: BC Managed Care – PPO | Admitting: Psychologist

## 2021-12-18 ENCOUNTER — Ambulatory Visit: Payer: BC Managed Care – PPO | Admitting: Family Medicine

## 2021-12-18 VITALS — BP 126/82 | HR 64 | Temp 96.8°F | Ht 71.0 in | Wt 222.8 lb

## 2021-12-18 DIAGNOSIS — Z6837 Body mass index (BMI) 37.0-37.9, adult: Secondary | ICD-10-CM

## 2021-12-18 DIAGNOSIS — G629 Polyneuropathy, unspecified: Secondary | ICD-10-CM | POA: Insufficient documentation

## 2021-12-18 DIAGNOSIS — D6869 Other thrombophilia: Secondary | ICD-10-CM

## 2021-12-18 DIAGNOSIS — F341 Dysthymic disorder: Secondary | ICD-10-CM

## 2021-12-18 DIAGNOSIS — G4733 Obstructive sleep apnea (adult) (pediatric): Secondary | ICD-10-CM

## 2021-12-18 DIAGNOSIS — E1159 Type 2 diabetes mellitus with other circulatory complications: Secondary | ICD-10-CM

## 2021-12-18 DIAGNOSIS — G6289 Other specified polyneuropathies: Secondary | ICD-10-CM

## 2021-12-18 DIAGNOSIS — E1169 Type 2 diabetes mellitus with other specified complication: Secondary | ICD-10-CM | POA: Diagnosis not present

## 2021-12-18 DIAGNOSIS — I152 Hypertension secondary to endocrine disorders: Secondary | ICD-10-CM

## 2021-12-18 DIAGNOSIS — N393 Stress incontinence (female) (male): Secondary | ICD-10-CM

## 2021-12-18 LAB — POCT GLYCOSYLATED HEMOGLOBIN (HGB A1C): Hemoglobin A1C: 5.8 % — AB (ref 4.0–5.6)

## 2021-12-18 NOTE — Progress Notes (Signed)
?  Subjective:  ? ? Patient ID: Laurie Mckinney, female    DOB: 1961/02/27, 61 y.o.   MRN: [id number] ? ?Laurie Mckinney is a 61 y.o. female who presents for follow-up of Type 2 diabetes mellitus. ? ?Home blood sugar records:  fasting avg. 117 ?Current symptoms/problems include  numbness and pain in feet  and have been unchanged. ?Daily foot checks: yes  Any foot concerns: numbness and pain ?Exercise:  staying active  ?Diet: good ?She does have a history of PAF and is on Xarelto.  She continues on Wellbutrin and Paxil which is helping with her underlying psychological issues.  She is also getting counseling for this.  Continues on Lipitor without difficulty.  Ditropan is helping with her bladder related problems.  She was taking gabapentin for the neuropathy however stopped taking it since she says she is not having that much difficulty with that. ?The following portions of the patient's history were reviewed and updated as appropriate: allergies, current medications, past medical history, past social history and problem list. ? ?ROS as in subjective above. ? ?   ?Objective:  ?  ?Physical Exam ?Alert and in no distress cardiac exam shows a regular rhythm.  Hemoglobin A1c is 5.8 ?Last menstrual period 01/26/2011. ? ?Lab Review ?Diabetic Labs Latest Ref Rng & Units 06/20/2021 05/13/2021 02/10/2021 11/14/2020 01/15/2020  ?HbA1c 4.0 - 5.6 % 5.8(A) 6.1(H) 6.4(A) 6.7(H) 6.5(A)  ?Microalbumin mg/L - - <5.0 - -  ?Micro/Creat Ratio - - - <4.9 - -  ?Chol 100 - 199 mg/dL - - - 135 -  ?HDL >39 mg/dL - - - 62 -  ?Calc LDL 0 - 99 mg/dL - - - 62 -  ?Triglycerides 0 - 149 mg/dL - - - 45 -  ?Creatinine 0.57 - 1.00 mg/dL - 0.72 - 0.78 -  ? ?BP/Weight 12/02/2021 11/19/2021 10/22/2021 09/23/2021 09/02/2021  ?Systolic BP 585 277 824 235 130  ?Diastolic BP 72 76 82 71 76  ?Wt. (Lbs) 224.8 218 219 221 224  ?BMI 31.35 30.4 30.54 30.82 31.24  ? ?Foot/eye exam completion dates Latest Ref Rng & Units 04/18/2021 02/10/2021  ?Eye Exam No Retinopathy  Retinopathy(A) -  ?Foot Form Completion - - Done  ? ? ?Laurie Mckinney  reports that she has never smoked. She has never used smokeless tobacco. She reports current alcohol use of about 1.0 standard drink per week. She reports that she does not use drugs. ? ?   ?Assessment & Plan:  ?  ?Type 2 diabetes mellitus with other specified complication, without long-term current use of insulin (HCC) - Plan: POCT glycosylated hemoglobin (Hb A1C) ? ?Hypertension associated with diabetes ([place]) ? ?Obesity with current BMI 30.4 ? ?Dysthymia ? ?Secondary hypercoagulable state ([place]) ? ?Female stress incontinence ? ?Other polyneuropathy ?Rx changes: none ?Education: Reviewed ?ABCs? of diabetes management (respective goals in parentheses):  A1C (<7), blood pressure (<130/80), and cholesterol (LDL <100). ?Compliance at present is estimated to be good. Efforts to improve compliance (if necessary) will be directed at  no change . ?Follow up: 4 months ?I complemented her on the good work that she is doing going to medical weight loss and wellness.  She will continue in counseling which seems to be going well.  She has concerns over continuing on her present insurance plan as her husband might be retiring which will change the insurance program.  I explained that I will continue to help with that.  She will continue on her CPAP. ? ? ? ?  ?

## 2021-12-22 ENCOUNTER — Ambulatory Visit (INDEPENDENT_AMBULATORY_CARE_PROVIDER_SITE_OTHER): Payer: BC Managed Care – PPO | Admitting: Adult Health

## 2021-12-24 ENCOUNTER — Other Ambulatory Visit: Payer: Self-pay | Admitting: Family Medicine

## 2021-12-24 ENCOUNTER — Other Ambulatory Visit (INDEPENDENT_AMBULATORY_CARE_PROVIDER_SITE_OTHER): Payer: Self-pay | Admitting: Adult Health

## 2021-12-24 DIAGNOSIS — E1169 Type 2 diabetes mellitus with other specified complication: Secondary | ICD-10-CM

## 2021-12-24 DIAGNOSIS — F341 Dysthymic disorder: Secondary | ICD-10-CM

## 2021-12-24 MED ORDER — SEMAGLUTIDE (1 MG/DOSE) 4 MG/3ML ~~LOC~~ SOPN: 1.0000 mg | PEN_INJECTOR | SUBCUTANEOUS | 0 refills | Status: DC

## 2021-12-24 NOTE — Telephone Encounter (Signed)
LAST APPOINTMENT DATE: 11/19/21 ?NEXT APPOINTMENT DATE: 12/30/21 ? ? ?[id number] [place]  - [id number]?Phone: [phone]: [phone]? ?[facility] [place] [id number]?Phone: [phone]: [phone]? ?Patient is requesting a refill of the following medications: ?Pending Prescriptions:                       Disp   Refills ?  Semaglutide, 1 MG/DOSE, 4 MG/3ML SOPN      9 mL   0       ?Sig: Inject 1 mg as directed once a week. ? ? ?Date last filled: 09/09/21 ?Previously prescribed by KValetta Fuller? ?Lab Results ?     Component                Value               Date                 ?     HGBA1C                   5.8 (A)             12/18/2021           ?     HGBA1C                   5.8 (A)             06/20/2021           ?     HGBA1C                   6.1 (H)             05/13/2021           ?Lab Results ?     Component                Value               Date                 ?     MICROALBUR               <5.0                02/10/2021           ?     [place]                 62                  11/14/2020           ?     CREATININE               0.72                05/13/2021           ?Lab Results ?     Component                Value               Date                 ?  VD25OH                   40.5                11/14/2020           ?     VD25OH                   35                  08/14/2011           ? ?BP Readings from Last 3 Encounters: ?12/18/21 : 126/82 ?12/02/21 : 112/72 ?11/19/21 : 115/76 ?

## 2021-12-24 NOTE — Telephone Encounter (Signed)
[facility] Please advise [place] ?

## 2021-12-30 ENCOUNTER — Other Ambulatory Visit: Payer: Self-pay

## 2021-12-30 ENCOUNTER — Encounter (INDEPENDENT_AMBULATORY_CARE_PROVIDER_SITE_OTHER): Payer: Self-pay | Admitting: Nurse Practitioner

## 2021-12-30 ENCOUNTER — Ambulatory Visit (INDEPENDENT_AMBULATORY_CARE_PROVIDER_SITE_OTHER): Payer: BC Managed Care – PPO | Admitting: Nurse Practitioner

## 2021-12-30 VITALS — BP 103/68 | HR 66 | Temp 98.0°F | Ht 71.0 in | Wt 213.0 lb

## 2021-12-30 DIAGNOSIS — I152 Hypertension secondary to endocrine disorders: Secondary | ICD-10-CM | POA: Diagnosis not present

## 2021-12-30 DIAGNOSIS — Z7985 Long-term (current) use of injectable non-insulin antidiabetic drugs: Secondary | ICD-10-CM

## 2021-12-30 DIAGNOSIS — E1169 Type 2 diabetes mellitus with other specified complication: Secondary | ICD-10-CM

## 2021-12-30 DIAGNOSIS — E785 Hyperlipidemia, unspecified: Secondary | ICD-10-CM

## 2021-12-30 DIAGNOSIS — E669 Obesity, unspecified: Secondary | ICD-10-CM

## 2021-12-30 DIAGNOSIS — E1159 Type 2 diabetes mellitus with other circulatory complications: Secondary | ICD-10-CM

## 2021-12-30 DIAGNOSIS — Z6829 Body mass index (BMI) 29.0-29.9, adult: Secondary | ICD-10-CM

## 2021-12-30 NOTE — Progress Notes (Signed)
? ? ? ?Chief Complaint:  ? ?OBESITY ?Laurie Mckinney is here to discuss her progress with her obesity treatment plan along with follow-up of her obesity related diagnoses. Laurie Mckinney is on the Category 3 Plan and keeping a food journal and adhering to recommended goals of 350-500 calories and 35 grams of  protein at lunch and states she is following her eating plan approximately 40% of the time. Laurie Mckinney states she is walking the dog and yard work for 30 minutes 5-7 times per week. ? ?Today's visit was #: 21 ?Starting weight: 268 lbs ?Starting date: 11/14/2020 ?Today's weight: 213 lbs ?Today's date: 12/30/2021 ?Total lbs lost to date: 55 lbs ?Total lbs lost since last in-office visit: 5 lbs ? ?Interim History: Laurie Mckinney is doing well with weight loss. She finds getting bored with current plan. She note craving carbohydrates. She has high school 75 th reunion in April.  ? ?Subjective:  ? ?1. Type 2 diabetes mellitus with other specified complication, without long-term current use of insulin ([place]) ?Laurie Mckinney's last A1C was 5.8. She is taking Ozempic 1 mg. She denies side effects. She is not checking fasting blood sugar at home. She is on ACE and statin.  ? ?2. Hypertension associated with diabetes ([place]) ?Laurie Mckinney is taking Lisinopril-HCT. She denies side effects. Her blood pressure is at goal.  ? ?3. Hyperlipidemia associated with type 2 diabetes mellitus ([place]) ?Laurie Mckinney is currently taking Lipitor 10 mg. She denies side effects.  ? ?Assessment/Plan:  ? ?1. Type 2 diabetes mellitus with other specified complication, without long-term current use of insulin ([place]) ?Laurie Mckinney will continue Ozempic 1 mg . She denies side effects. Good blood sugar control is important to decrease the likelihood of diabetic complications such as nephropathy, neuropathy, limb loss, blindness, coronary artery disease, and death. Intensive lifestyle modification including diet, exercise and weight loss are the first line of treatment for diabetes.  ? ?2.  Hypertension associated with diabetes ([place]) ?Laurie Mckinney will continue to follow up with primary care physician. She will continue medication as directed. She is working on healthy weight loss and exercise to improve blood pressure control. We will watch for signs of hypotension as she continues her lifestyle modifications. ? ?3. Hyperlipidemia associated with type 2 diabetes mellitus ([place]) ?Cardiovascular risk and specific lipid/LDL goals reviewed.  Laurie Mckinney will continue to follow up with primary care physician. She will continue medications as directed. We discussed several lifestyle modifications today and [place] will continue to work on diet, exercise and weight loss efforts. Orders and follow up as documented in patient record.  ? ?Counseling ?Intensive lifestyle modifications are the first line treatment for this issue. ?Dietary changes: Increase soluble fiber. Decrease simple carbohydrates. ?Exercise changes: Moderate to vigorous-intensity aerobic activity 150 minutes per week if tolerated. ?Lipid-lowering medications: see documented in medical record. ? ?4. Obesity with current BMI 29.8 ?Laurie Mckinney is currently in the action stage of change. As such, her goal is to continue with weight loss efforts. She has agreed to the Category 3 Plan and keeping a food journal and adhering to recommended goals of 450-600 calories and 40 plus grams of protein dinner.   ? ?Handout was given for Category 3, additional breakfast ideas and journlaing for dinner.  ? ?Exercise goals:  As is.  ? ?Behavioral modification strategies: increasing lean protein intake, increasing water intake, no skipping meals, and meal planning and cooking strategies. ? ?Laurie Mckinney has agreed to follow-up with our clinic in 4 weeks. She was informed of the importance of frequent follow-up visits to maximize  her success with intensive lifestyle modifications for her multiple health conditions.  ? ?Objective:  ? ?Blood pressure 103/68, pulse 66,  temperature 98 ?F (36.7 ?C), height '5\' 11"'$  (1.803 m), weight 213 lb (96.6 kg), last menstrual period 01/26/2011, SpO2 99 %. ?Body mass index is 29.71 kg/m?. ? ?General: Cooperative, alert, well developed, in no acute distress. ?HEENT: Conjunctivae and lids unremarkable. ?Cardiovascular: Regular rhythm.  ?Lungs: Normal work of breathing. ?Neurologic: No focal deficits.  ? ?Lab Results  ?Component Value Date  ? CREATININE 0.72 05/13/2021  ? BUN 17 05/13/2021  ? NA 138 05/13/2021  ? K 4.8 05/13/2021  ? CL 99 05/13/2021  ? CO2 20 05/13/2021  ? ?Lab Results  ?Component Value Date  ? ALT 14 05/13/2021  ? AST 18 05/13/2021  ? ALKPHOS 92 05/13/2021  ? BILITOT 0.5 05/13/2021  ? ?Lab Results  ?Component Value Date  ? HGBA1C 5.8 (A) 12/18/2021  ? HGBA1C 5.8 (A) 06/20/2021  ? HGBA1C 6.1 (H) 05/13/2021  ? HGBA1C 6.4 (A) 02/10/2021  ? HGBA1C 6.7 (H) 11/14/2020  ? ?Lab Results  ?Component Value Date  ? INSULIN 9.7 05/13/2021  ? INSULIN 4.0 11/14/2020  ? ?Lab Results  ?Component Value Date  ? TSH 0.724 11/14/2020  ? ?Lab Results  ?Component Value Date  ? CHOL 135 11/14/2020  ? HDL 62 11/14/2020  ? [place] 62 11/14/2020  ? TRIG 45 11/14/2020  ? CHOLHDL 2.6 10/05/2019  ? ?Lab Results  ?Component Value Date  ? VD25OH 40.5 11/14/2020  ? VD25OH 35 08/14/2011  ? ?Lab Results  ?Component Value Date  ? WBC 7.8 11/14/2020  ? HGB 14.5 11/14/2020  ? HCT 43.2 11/14/2020  ? MCV 92 11/14/2020  ? PLT 289 11/14/2020  ? ?No results found for: IRON, TIBC, FERRITIN ? ?Attestation Statements:  ? ?Reviewed by clinician on day of visit: allergies, medications, problem list, medical history, surgical history, family history, social history, and previous encounter notes. ? ?Time spent on visit including pre-visit chart review and post-visit care and charting was 30 minutes.  ? ?I, Lizbeth Bark, RMA, am acting as Location manager for Everardo Pacific, FNP. ? ?I have reviewed the above documentation for accuracy and completeness, and I agree with the  above. Everardo Pacific, FNP  ? ?

## 2022-01-01 ENCOUNTER — Ambulatory Visit (INDEPENDENT_AMBULATORY_CARE_PROVIDER_SITE_OTHER): Payer: BC Managed Care – PPO | Admitting: Psychologist

## 2022-01-01 DIAGNOSIS — F320 Major depressive disorder, single episode, mild: Secondary | ICD-10-CM

## 2022-01-01 NOTE — Progress Notes (Signed)
[facility] Counselor/Therapist Progress Note ? ?Patient ID: Laurie Mckinney, MRN: [id number],   ? ?Date: 01/01/2022 ? ?Time Spent: 03:00 pm to 3:40 pm; total time: 40 minutes ? ?This session was held via video webex teletherapy due to the coronavirus risk at this time. The patient consented to video teletherapy and was located at her home during this session. She is aware it is the responsibility of the patient to secure confidentiality on her end of the session. The provider was in a private home office for the duration of this session. Limits of confidentiality were discussed with the patient.  ? ?Treatment Type: Individual Therapy ? ?Reported Symptoms: Depression ? ?Mental Status Exam: ?Appearance:  Casual     ?Behavior: Appropriate  ?Motor: Normal  ?Speech/Language:  Clear and Coherent  ?Affect: Appropriate  ?Mood: normal  ?Thought process: normal  ?Thought content:   WNL  ?Sensory/Perceptual disturbances:   WNL  ?Orientation: oriented to person, place, and time/date  ?Attention: Good  ?Concentration: Good  ?Memory: WNL  ?Fund of knowledge:  Good  ?Insight:   Fair  ?Judgment:  Fair  ?Impulse Control: Good  ? ?Risk Assessment: ?Danger to Self:  No ?Self-injurious Behavior: No ?Danger to Others: No ?Duty to Warn:no ?Physical Aggression / Violence:No  ?Access to Firearms a concern: No  ?Gang Involvement:No  ? ?Subjective: Beginning the session, the patient described herself as doing well. Elaborating, she voiced that she is working on discovering what life looks like via retirement. She processed thoughts and emotions. She talked about finding a balance of caring for self and accomplishing tasks. She was agreeable to the plan discussed. She denied suicidal and homicidal ideation.   ? ?Interventions:  Worked on developing a therapeutic relationship with the patient using active listening and reflective statements. Provided emotional support using empathy and validation. Praised patient for doing so  well and being able to identify her values. Normalized and validated expressed thoughts and emotions. Used socratic questions to assist the patient gain insight into self. Explored the balance of caring for self and others. Assisted in problem solving.  Provided empathic statements. Assigned homework. Assessed for suicidal and homicidal ideation. ? ?Homework: Complete board at home for tasks to complete ? ?Next Session: Review homework and emotional support.  ? ?Diagnosis: F32.0 major depressive affective disorder, single episode, mild  ? ?Plan:  ? ?Client Abilities: Friendly and easy to develop rapport ? ?Client Preferences: ACT and CBT ? ?Client statement of Needs: Coping skills and emotional support ? ?Treatment Level: Outpatient  ? ?Goals ?Alleviate depressive symptoms ?Recognize, accept, and cope with depressive feelings ?Develop healthy thinking patterns ?Develop healthy interpersonal relationships ? ?Objectives target date for all objectives is 11/04/2022 ?Cooperate with a medication evaluation by a physician ?Verbalize an accurate understanding of depression ?Verbalize an understanding of the treatment ?Identify and replace thoughts that support depression ?Learn and implement behavioral strategies ?Verbalize an understanding and resolution of current interpersonal problems ?Learn and implement problem solving and decision making skills ?Learn and implement conflict resolution skills to resolve interpersonal problems ?Verbalize an understanding of healthy and unhealthy emotions verbalize insight into how past relationships may be influence current experiences with depression ?Use mindfulness and acceptance strategies and increase value based behavior  ?Increase hopeful statements about the future.  ?Interventions ?Consistent with treatment model, discuss how change in cognitive, behavioral, and interpersonal can help client alleviate depression ?CBT ?Behavioral activation help the client explore the  relationship, nature of the dispute,  ?Help the client develop new interpersonal  skills and relationships ?Conduct Problem so living therapy ?Teach conflict resolution skills ?Use a process-experiential approach ?Conduct TLDP ?Conduct ACT ?Evaluate need for psychotropic medication ?Monitor adherence to medication ? ?The patient and clinician reviewed the treatment plan on 11/26/2021. The patient approved of the treatment plan.  ? ?Conception Chancy, PsyD ? ? ? ? ? ? ? ? ? ? ? ? ? ? ? ? ? ?Conception Chancy, PsyD ?

## 2022-01-05 ENCOUNTER — Other Ambulatory Visit: Payer: Self-pay | Admitting: Plastic Surgery

## 2022-01-05 ENCOUNTER — Ambulatory Visit
Admission: RE | Admit: 2022-01-05 | Discharge: 2022-01-05 | Disposition: A | Discharge status: Home / Self Care | Payer: BC Managed Care – PPO | Source: Ambulatory Visit | Attending: Plastic Surgery | Admitting: Plastic Surgery

## 2022-01-05 DIAGNOSIS — Z8669 Personal history of other diseases of the nervous system and sense organs: Secondary | ICD-10-CM

## 2022-01-05 DIAGNOSIS — Z139 Encounter for screening, unspecified: Secondary | ICD-10-CM

## 2022-01-05 DIAGNOSIS — M545 Low back pain, unspecified: Secondary | ICD-10-CM

## 2022-01-05 DIAGNOSIS — N62 Hypertrophy of breast: Secondary | ICD-10-CM

## 2022-01-05 DIAGNOSIS — E669 Obesity, unspecified: Secondary | ICD-10-CM

## 2022-01-14 ENCOUNTER — Encounter: Payer: Self-pay | Admitting: Cardiology

## 2022-01-14 ENCOUNTER — Ambulatory Visit: Payer: BC Managed Care – PPO | Admitting: Cardiology

## 2022-01-14 VITALS — BP 116/84 | HR 63 | Ht 71.0 in | Wt 222.2 lb

## 2022-01-14 DIAGNOSIS — G4733 Obstructive sleep apnea (adult) (pediatric): Secondary | ICD-10-CM | POA: Diagnosis not present

## 2022-01-14 DIAGNOSIS — I4891 Unspecified atrial fibrillation: Secondary | ICD-10-CM

## 2022-01-14 DIAGNOSIS — D6869 Other thrombophilia: Secondary | ICD-10-CM | POA: Diagnosis not present

## 2022-01-14 DIAGNOSIS — I10 Essential (primary) hypertension: Secondary | ICD-10-CM | POA: Diagnosis not present

## 2022-01-14 NOTE — Progress Notes (Signed)
? ? ? ?Clinical Summary ?Ms. Storts is a 61 y.o.female seen today for follow up of the following medical problems.  ?  ?1. AFib/Aflutter ?- prior issues with afib management, did not tolerate even rate controlled afib ? - had afib ablation Jan 2021 ? ?- no significant palpitations ?- no bleeding on xarelto.  ?  ?  ?2. SOB/DOE ?06/2019 echo LVEF 55-60%, mild LAE ?- symptoms have resolved since her afib ablation ?- no recent issues ?  ?3. Chest pain ?  ?01/2015 GXT no ischemic changes.  ?- no recent issues ?  ?  ?  ?  ?4. OSA ?- on cpap, followed by [facility] ?  ? ?5.HTN ?- she is compliant with meds ? ?6. DM2  ?-glycemic control per pcp ?- she is on statin given history of DM2 ?- 11/2020 TC 135 TG 45 HDL 62 LDL 62 ? ? ?SH: retired 2 years, worked for [employer] over 25 years.  ? ?Past Medical History:  ?Diagnosis Date  ? Adjustment insomnia   ? SHIFT WORK  ? Allergy   ? RHINITIS  ? Asthma   ? Atrial flutter ([place]) 02/26/15  ? [facility]  ? Back pain   ? Bursitis   ? right hip  ? Cataract   ? see last eye eexam note   ? Constipation   ? Depression   ? Diabetes mellitus without complication ([place])   ? History of atrial fibrillation   ? History of cancer   ? Hypertension   ? Incontinence   ? Joint pain   ? Migraine headache   ? Mobitz type 2 second degree heart block   ? Neuropathy   ? Obesity   ? Other fatigue   ? Shortness of breath on exertion   ? Sleep apnea   ? ? ? ?No Known Allergies ? ? ?Current Outpatient Medications  ?Medication Sig Dispense Refill  ? acetaminophen (TYLENOL) 650 MG CR tablet Take 650 mg by mouth every 8 (eight) hours as needed for pain.    ? atorvastatin (LIPITOR) 10 MG tablet TAKE 1 TABLET DAILY 90 tablet 1  ? buPROPion (WELLBUTRIN SR) 200 MG 12 hr tablet Take 1 tablet (200 mg total) by mouth daily. 90 tablet 1  ? gabapentin (NEURONTIN) 100 MG capsule Take 2 capsules in AM and 2 capsules at bedtime 360 capsule 3  ? Lancets (ONETOUCH DELICA PLUS [id number]) MISC USE AND DISCARD 1  LANCET   DAILY 100 each 3  ? lisinopril-hydrochlorothiazide (ZESTORETIC) 20-25 MG tablet TAKE 1 TABLET DAILY (DOSE  INCREASE) 90 tablet 1  ? Misc Natural Products (IMMUNE FORMULA PO) Take 2 tablets by mouth daily. immuneti    ? Multiple Vitamins-Minerals (CENTRUM SILVER 50+WOMEN) TABS Take 1 tablet by mouth daily.    ? mupirocin ointment (BACTROBAN) 2 % Apply topically 2 (two) times daily. 15 g 0  ? Omega-3 Fatty Acids (FISH OIL) 1200 MG CAPS Take 2 capsules by mouth at bedtime.    ? ONETOUCH VERIO test strip USE AND DISCARD 1 TEST     STRIP AS NEEDED AS         INSTRUCTED 100 each 3  ? oxybutynin (DITROPAN-XL) 10 MG 24 hr tablet TAKE 1 TABLET AT BEDTIME 90 tablet 1  ? PARoxetine (PAXIL) 20 MG tablet TAKE 1 TABLET BY MOUTH EVERY DAY 90 tablet 1  ? rivaroxaban (XARELTO) 20 MG TABS tablet Take 1 tablet (20 mg total) by mouth daily with supper. 90 tablet 0  ?  Semaglutide, 1 MG/DOSE, 4 MG/3ML SOPN Inject 1 mg as directed once a week. 9 mL 0  ? UNABLE TO FIND Take 1 tablet by mouth Nightly. Med Name: Theotis Burrow    ? Vibegron (GEMTESA) 75 MG TABS Take by mouth at bedtime.    ? ?No current facility-administered medications for this visit.  ? ? ? ?Past Surgical History:  ?Procedure Laterality Date  ? ABDOMINAL HYSTERECTOMY    ? ATRIAL FIBRILLATION ABLATION N/A 10/27/2019  ? Procedure: ATRIAL FIBRILLATION ABLATION;  Surgeon: Constance Haw, MD;  Location: [facility];  Service: Cardiovascular;  Laterality: N/A;  ? CARDIOVERSION N/A 05/25/2019  ? Procedure: CARDIOVERSION;  Surgeon: Arnoldo Lenis, MD;  Location: [facility];  Service: Endoscopy;  Laterality: N/A;  ? COLONOSCOPY  2011  ? Dr.Brodie  ? ROBOTIC ASSISTED TOTAL HYSTERECTOMY WITH BILATERAL SALPINGO OOPHERECTOMY Bilateral 02/05/2015  ? Procedure: ROBOTIC ASSISTED TOTAL LAPAROSCOPIC HYSTERECTOMY WITH BILATERAL SALPINGO OOPHORECTOMY;  Surgeon: Everitt Amber, MD;  Location: [facility];  Service: Gynecology;  Laterality: Bilateral;  ? ? ? ?No Known Allergies ? ? ? ?Family  History  ?Problem Relation Age of Onset  ? Arthritis Mother   ? Heart disease Father   ? Hypertension Father   ? Hyperlipidemia Father   ? Sudden death Father   ? Neuropathy Brother   ?     both legs   ? Diabetes Brother   ?     no treatment that the pt knows of   ? Other Brother   ?     drank a 6 pack of beer daily x 30 years   ? Sleep apnea Neg Hx   ? ? ? ?Social History ?Ms. Mascaro reports that she has never smoked. She has never used smokeless tobacco. ?Ms. Hogans reports current alcohol use of about 1.0 standard drink per week. ? ? ?Review of Systems ?CONSTITUTIONAL: No weight loss, fever, chills, weakness or fatigue.  ?HEENT: Eyes: No visual loss, blurred vision, double vision or yellow sclerae.No hearing loss, sneezing, congestion, runny nose or sore throat.  ?SKIN: No rash or itching.  ?CARDIOVASCULAR: per hpi ?RESPIRATORY: No shortness of breath, cough or sputum.  ?GASTROINTESTINAL: No anorexia, nausea, vomiting or diarrhea. No abdominal pain or blood.  ?GENITOURINARY: No burning on urination, no polyuria ?NEUROLOGICAL: No headache, dizziness, syncope, paralysis, ataxia, numbness or tingling in the extremities. No change in bowel or bladder control.  ?MUSCULOSKELETAL: No muscle, back pain, joint pain or stiffness.  ?LYMPHATICS: No enlarged nodes. No history of splenectomy.  ?PSYCHIATRIC: No history of depression or anxiety.  ?ENDOCRINOLOGIC: No reports of sweating, cold or heat intolerance. No polyuria or polydipsia.  ?. ? ? ?Physical Examination ?Today's Vitals  ? 01/14/22 0817  ?BP: 116/84  ?Pulse: 63  ?SpO2: 99%  ?Weight: 222 lb 3.2 oz (100.8 kg)  ?Height: '5\' 11"'$  (1.803 m)  ? ?Body mass index is 30.99 kg/m?. ? ?Gen: resting comfortably, no acute distress ?HEENT: no scleral icterus, pupils equal round and reactive, no palptable cervical adenopathy,  ?CV: RRR, no m/r/g no jvd ?Resp: Clear to auscultation bilaterally ?GI: abdomen is soft, non-tender, non-distended, normal bowel sounds, no  hepatosplenomegaly ?MSK: extremities are warm, no edema.  ?Skin: warm, no rash ?Neuro:  no focal deficits ?Psych: appropriate affect ? ? ?Diagnostic Studies ? ?06/2019 echo ?IMPRESSIONS ?  ?  ? 1. The left ventricle has normal systolic function, with an ejection fraction of 55-60%. The cavity size was normal. Left ventricular diastolic Doppler parameters are indeterminate. ? 2.  The right ventricle has normal systolic function. The cavity was normal. There is no increase in right ventricular wall thickness. ? 3. Left atrial size was mildly dilated. ? 4. No evidence of mitral valve stenosis. ? 5. The aortic valve has an indeterminate number of cusps. No stenosis of the aortic valve. ? 6. The aorta is normal unless otherwise noted. ? 7. The aortic root is normal in size and structure. ? 8. Pulmonary hypertension is indeterminant, inadequate TR jet. ? 9. The interatrial septum was not well visualized. ? ? ?Assessment and Plan  ? ?1. Afib/acquired thrombophilia ?- s/p ablation ?- doing well without symptoms ?- EKG today shows SR, long first degree AV block that is asymptomatic ?- continue currennt meds ?  ?2.HTN ?- at goal, continue current meds ? ? ?F/u  1 year ? ? ? ? ?Arnoldo Lenis, M.D. ?

## 2022-01-14 NOTE — Patient Instructions (Signed)

## 2022-01-26 ENCOUNTER — Other Ambulatory Visit: Payer: Self-pay | Admitting: Family Medicine

## 2022-01-26 DIAGNOSIS — I152 Hypertension secondary to endocrine disorders: Secondary | ICD-10-CM

## 2022-01-26 DIAGNOSIS — E1159 Type 2 diabetes mellitus with other circulatory complications: Secondary | ICD-10-CM

## 2022-02-02 ENCOUNTER — Ambulatory Visit (INDEPENDENT_AMBULATORY_CARE_PROVIDER_SITE_OTHER): Payer: BC Managed Care – PPO | Admitting: Adult Health

## 2022-02-02 ENCOUNTER — Encounter (INDEPENDENT_AMBULATORY_CARE_PROVIDER_SITE_OTHER): Payer: Self-pay | Admitting: Adult Health

## 2022-02-02 VITALS — BP 114/76 | HR 72 | Temp 97.9°F | Ht 71.0 in | Wt 212.0 lb

## 2022-02-02 DIAGNOSIS — E1169 Type 2 diabetes mellitus with other specified complication: Secondary | ICD-10-CM | POA: Diagnosis not present

## 2022-02-02 DIAGNOSIS — I4891 Unspecified atrial fibrillation: Secondary | ICD-10-CM | POA: Diagnosis not present

## 2022-02-02 DIAGNOSIS — Z6837 Body mass index (BMI) 37.0-37.9, adult: Secondary | ICD-10-CM

## 2022-02-02 MED ORDER — SEMAGLUTIDE (1 MG/DOSE) 4 MG/3ML ~~LOC~~ SOPN: 1.0000 mg | PEN_INJECTOR | SUBCUTANEOUS | 0 refills | Status: DC

## 2022-02-11 NOTE — Progress Notes (Signed)
? ? ? ?Chief Complaint:  ? ?OBESITY ?Yaa is here to discuss her progress with her obesity treatment plan along with follow-up of her obesity related diagnoses. Maggy is on the Category 3 Plan and keeping a food journal and adhering to recommended goals of 450-600 calories and 40 plus gms of  protein at dinner and states she is following her eating plan approximately 40% of the time. Shunda states she is yard work and walking 15-20 minutes 2 times per week. ? ?Today's visit was #: 15 ?Starting weight: 268 ?Starting date: 11/14/20 ?Today's weight: 212 ?Today's date: 02/02/22 ?Total lbs lost to date: 17 ?Total lbs lost since last in-office visit: -1 ? ?Interim History:  ?She has lost 2 pets - a dog from a seizure disorder and a 55 year old cat to old age. ?She has concentrated on increasing her protein intake at each meal. ?She reports tolerating Ozempic- denies SE. ? ?Of Note: ?Goal weight is 200 pounds, current weight is 212 pounds.  ?Lowest weight ever was 155-160 pounds in 1989 with [employer] program. ? ?Subjective:  ? ?1. Type 2 diabetes mellitus with other specified complication, without long-term current use of insulin ([place]) ?Started on Ozempic 01/29/21.  Currently on 1 mg of Ozempic. ?She denies symptoms of hypoglycemia.   ?She denies mass in neck, dysphagia, dyspepsia, persistent hoarseness, abd pain, or N/V/Constipation. ?12/18/2021-A1c 5.8-at goal ? ?2. Atrial fibrillation, unspecified type ([place]) ?01/14/22/ Cards OV notes: ?06/2019 echo ?IMPRESSIONS ? 1. The left ventricle has normal systolic function, with an ejection fraction of 55-60%. The cavity size was normal. Left ventricular diastolic Doppler parameters are indeterminate. ? 2. The right ventricle has normal systolic function. The cavity was normal. There is no increase in right ventricular wall thickness. ? 3. Left atrial size was mildly dilated. ? 4. No evidence of mitral valve stenosis. ? 5. The aortic valve has an indeterminate number of  cusps. No stenosis of the aortic valve. ? 6. The aorta is normal unless otherwise noted. ? 7. The aortic root is normal in size and structure. ? 8. Pulmonary hypertension is indeterminant, inadequate TR jet. ? 9. The interatrial septum was not well visualized. ?  ? Assessment and Plan  ?  ?1. Afib/acquired thrombophilia ?- s/p ablation ?- doing well without symptoms ?- EKG today shows SR, long first degree AV block that is asymptomatic ?- continue currennt meds ?  ?2.HTN ?- at goal, continue current meds ? ?She is 100% compliant with daily Xarelto 20 mg- denies unusual bleeding/bruising.  ? ? ?Assessment/Plan:  ? ?1. Type 2 diabetes mellitus with other specified complication, without long-term current use of insulin ([place]) ?Refill Ozempic 1 mg weekly dispense 9 ml with no refill. ?- Semaglutide, 1 MG/DOSE, 4 MG/3ML SOPN; Inject 1 mg as directed once a week.  Dispense: 9 mL; Refill: 0 ? ?Check blood glucose at home at least 2 times per week or if experiencing sx's of hypoglycemia. ? ?2. Atrial fibrillation, unspecified type ([place]) ?Continue plan per cardiology.   ?Follow-up with cardiology April 2024. ? ?3. Obesity with current BMI 29.7 ? ?Helga is currently in the action stage of change. As such, her goal is to continue with weight loss efforts. She has agreed to the Category 3 Plan.  ? ?Exercise goals: Continue current regimen. ? ?Behavioral modification strategies: increasing lean protein intake, decreasing simple carbohydrates, increasing water intake, meal planning and cooking strategies, keeping healthy foods in the home, and planning for success. ? ?Cathyann has agreed to follow-up with our  clinic in 5 weeks. She was informed of the importance of frequent follow-up visits to maximize her success with intensive lifestyle modifications for her multiple health conditions.  ? ? ?Objective:  ? ?Blood pressure 114/76, pulse 72, temperature 97.9 ?F (36.6 ?C), height '5\' 11"'$  (1.803 m), weight 212 lb (96.2 kg), last  menstrual period 01/26/2011, SpO2 97 %. ?Body mass index is 29.57 kg/m?. ? ?General: Cooperative, alert, well developed, in no acute distress. ?HEENT: Conjunctivae and lids unremarkable. ?Cardiovascular: Regular rhythm.  ?Lungs: Normal work of breathing. ?Neurologic: No focal deficits.  ? ?Lab Results  ?Component Value Date  ? CREATININE 0.72 05/13/2021  ? BUN 17 05/13/2021  ? NA 138 05/13/2021  ? K 4.8 05/13/2021  ? CL 99 05/13/2021  ? CO2 20 05/13/2021  ? ?Lab Results  ?Component Value Date  ? ALT 14 05/13/2021  ? AST 18 05/13/2021  ? ALKPHOS 92 05/13/2021  ? BILITOT 0.5 05/13/2021  ? ?Lab Results  ?Component Value Date  ? HGBA1C 5.8 (A) 12/18/2021  ? HGBA1C 5.8 (A) 06/20/2021  ? HGBA1C 6.1 (H) 05/13/2021  ? HGBA1C 6.4 (A) 02/10/2021  ? HGBA1C 6.7 (H) 11/14/2020  ? ?Lab Results  ?Component Value Date  ? INSULIN 9.7 05/13/2021  ? INSULIN 4.0 11/14/2020  ? ?Lab Results  ?Component Value Date  ? TSH 0.724 11/14/2020  ? ?Lab Results  ?Component Value Date  ? CHOL 135 11/14/2020  ? HDL 62 11/14/2020  ? Isabel 62 11/14/2020  ? TRIG 45 11/14/2020  ? CHOLHDL 2.6 10/05/2019  ? ?Lab Results  ?Component Value Date  ? VD25OH 40.5 11/14/2020  ? VD25OH 35 08/14/2011  ? ?Lab Results  ?Component Value Date  ? WBC 7.8 11/14/2020  ? HGB 14.5 11/14/2020  ? HCT 43.2 11/14/2020  ? MCV 92 11/14/2020  ? PLT 289 11/14/2020  ? ?No results found for: IRON, TIBC, FERRITIN ? ?Obesity Behavioral Intervention:  ? ?Approximately 15 minutes were spent on the discussion below. ? ?ASK: ?We discussed the diagnosis of obesity with Mardene Celeste today and Austin agreed to give [place] permission to discuss obesity behavioral modification therapy today. ? ?ASSESS: ?Toniyah has the diagnosis of obesity and her BMI today is 29.7. Siham is in the action stage of change.  ? ?ADVISE: ?Bevan was educated on the multiple health risks of obesity as well as the benefit of weight loss to improve her health. She was advised of the need for long term treatment  and the importance of lifestyle modifications to improve her current health and to decrease her risk of future health problems. ? ?AGREE: ?Multiple dietary modification options and treatment options were discussed and Tristian agreed to follow the recommendations documented in the above note. ? ?ARRANGE: ?Yohana was educated on the importance of frequent visits to treat obesity as outlined per CMS and USPSTF guidelines and agreed to schedule her next follow up appointment today. ? ?Attestation Statements:  ? ?Reviewed by clinician on day of visit: allergies, medications, problem list, medical history, surgical history, family history, social history, and previous encounter notes. ? ?I, Georgianne Fick, FNP, am acting as Location manager for Mina Marble, NP. ? ?I have reviewed the above documentation for accuracy and completeness, and I agree with the above. -  Facundo Allemand d. Remy Voiles, NP-C  ?

## 2022-02-13 DIAGNOSIS — G4733 Obstructive sleep apnea (adult) (pediatric): Secondary | ICD-10-CM | POA: Diagnosis not present

## 2022-02-16 ENCOUNTER — Ambulatory Visit: Payer: BC Managed Care – PPO | Admitting: Psychologist

## 2022-02-16 ENCOUNTER — Telehealth: Payer: Self-pay

## 2022-02-16 NOTE — Telephone Encounter (Signed)
Called patient to follow up on PT. She has not started, but just needs to call to schedule. Advised her to call office on the 4th-5th visit to schedule a follow up appointment with a PA. Patient understood and agreed with plan. ?

## 2022-02-25 ENCOUNTER — Encounter (HOSPITAL_COMMUNITY): Payer: Self-pay

## 2022-02-25 ENCOUNTER — Ambulatory Visit (HOSPITAL_COMMUNITY): Payer: BC Managed Care – PPO | Attending: Plastic Surgery

## 2022-02-25 DIAGNOSIS — M545 Low back pain, unspecified: Secondary | ICD-10-CM | POA: Diagnosis not present

## 2022-02-25 DIAGNOSIS — E669 Obesity, unspecified: Secondary | ICD-10-CM | POA: Insufficient documentation

## 2022-02-25 DIAGNOSIS — G8929 Other chronic pain: Secondary | ICD-10-CM | POA: Diagnosis not present

## 2022-02-25 DIAGNOSIS — M5459 Other low back pain: Secondary | ICD-10-CM

## 2022-02-25 DIAGNOSIS — Z8669 Personal history of other diseases of the nervous system and sense organs: Secondary | ICD-10-CM | POA: Diagnosis not present

## 2022-02-25 DIAGNOSIS — N62 Hypertrophy of breast: Secondary | ICD-10-CM | POA: Diagnosis not present

## 2022-02-25 DIAGNOSIS — R293 Abnormal posture: Secondary | ICD-10-CM

## 2022-02-25 NOTE — Therapy (Signed)
OUTPATIENT PHYSICAL THERAPY THORACOLUMBAR EVALUATION   Patient Name: Laurie Mckinney MRN: [id number] DOB:23-May-1961, 61 y.o., female Today's Date: 02/25/2022   PT End of Session - 02/25/22 0811     Visit Number 1    Number of Visits 8    Date for PT Re-Evaluation 03/25/22    Authorization Type BCBS COMM PPO (no copay, no auth)    Progress Note Due on Visit 0.01    PT Start Time 0815    PT Stop Time 0900    PT Time Calculation (min) 45 min    Activity Tolerance Patient tolerated treatment well    Behavior During Therapy WFL for tasks assessed/performed             Past Medical History:  Diagnosis Date   Adjustment insomnia    SHIFT WORK   Allergy    RHINITIS   Asthma    Atrial flutter ([place]) 02/26/15   [facility]   Back pain    Bursitis    right hip   Cataract    see last eye eexam note    Constipation    Depression    Diabetes mellitus without complication ([place])    History of atrial fibrillation    History of cancer    Hypertension    Incontinence    Joint pain    Migraine headache    Mobitz type 2 second degree heart block    Neuropathy    Obesity    Other fatigue    Shortness of breath on exertion    Sleep apnea    Past Surgical History:  Procedure Laterality Date   ABDOMINAL HYSTERECTOMY     ATRIAL FIBRILLATION ABLATION N/A 10/27/2019   Procedure: ATRIAL FIBRILLATION ABLATION;  Surgeon: Constance Haw, MD;  Location: [facility];  Service: Cardiovascular;  Laterality: N/A;   CARDIOVERSION N/A 05/25/2019   Procedure: CARDIOVERSION;  Surgeon: Arnoldo Lenis, MD;  Location: [facility];  Service: Endoscopy;  Laterality: N/A;   COLONOSCOPY  2011   Dr.Brodie   ROBOTIC ASSISTED TOTAL HYSTERECTOMY WITH BILATERAL SALPINGO OOPHERECTOMY Bilateral 02/05/2015   Procedure: ROBOTIC ASSISTED TOTAL LAPAROSCOPIC HYSTERECTOMY WITH BILATERAL SALPINGO OOPHORECTOMY;  Surgeon: Everitt Amber, MD;  Location: [facility];  Service: Gynecology;   Laterality: Bilateral;   Patient Active Problem List   Diagnosis Date Noted   Peripheral neuropathy 12/18/2021   Symptomatic mammary hypertrophy 12/02/2021   Recurrent UTI 09/02/2021   Lacerations of multiple sites of left arm 08/11/2021   Bilateral pendulous breasts 08/11/2021   Anxiety and depression 02/12/2021   Class 2 severe obesity with serious comorbidity and body mass index (BMI) of 37.0 to 37.9 in adult [facility]) 12/12/2020   Hyperlipidemia associated with type 2 diabetes mellitus ([place]) 11/14/2020   History of hysterectomy for cancer 08/19/2020   Secondary hypercoagulable state ([place]) 11/22/2019   Female stress incontinence 10/04/2019   Diabetic neuropathy ([place]) 09/14/2019   OSA (obstructive sleep apnea) 07/13/2019   Varicose veins of lower extremity 04/14/2019   Atrial fibrillation ([place]) 04/14/2019   Hearing loss 04/13/2019   Diabetes mellitus ([place]) 09/29/2018   Second degree heart block 05/05/2018   Chronic low back pain without sciatica 05/05/2018   Hypertension associated with diabetes ([place]) 09/20/2017   Actinic keratosis 02/24/2017   Dysthymia 01/10/2016   History of endometrial cancer 02/05/2015    Class: Stage 1   History of migraine headaches 03/18/2011   Allergic rhinitis due to pollen 03/18/2011   Obesity (BMI 30-39.9) 03/18/2011  PCP: Jill Alexanders  REFERRING PROVIDER: Wallace Going, DO   REFERRING DIAG:   [phone] (ICD-10-CM) - Chronic bilateral low back pain without sciatica   Rationale for Evaluation and Treatment Rehabilitation  THERAPY DIAG:  Other low back pain  Abnormal posture  ONSET DATE: 2019  SUBJECTIVE:                                                                                                                                                                                           SUBJECTIVE STATEMENT: Patient reports that she is experiencing low back pain b/l, pain has been present for several years, aprox 5 years.  Patient has not had treatment in past. Patient was seen by plastic surgeon who recommended PT for back.  Patient reports she gets thoracic, shoulder blade area pain whenever taking bra off. Patient is able to complete everything that she wants to do, but may have to make modifications to perform tasks.  PERTINENT HISTORY:  Low back pain B/L, pain in upper thoracic region.  Occasionally hands swell. Bursitis of right hip  PAIN:  Are you having pain? Yes: NPRS scale: 2/10 Pain location: low back  Pain description: dull pain in back  Aggravating factors: standing at workstation and bent over, ie washing dishes Relieving factors: shifting weight on legs, propping leg up on stool, sit down    PRECAUTIONS: None  WEIGHT BEARING RESTRICTIONS No  FALLS:  Has patient fallen in last 6 months? Yes. Number of falls from not picking up feet/loss of balance. Has had about 3 falls in past year.   LIVING ENVIRONMENT: Lives with: lives with their spouse Lives in: House/apartment Stairs: Yes: External: 1 steps; none Has following equipment at home: Single point cane  OCCUPATION: retired  PLOF: Independent and Independent with basic ADLs  PATIENT GOALS  to improve back pain, feel good and be healthy   OBJECTIVE:   DIAGNOSTIC FINDINGS:  FINDINGS: Five non-rib-bearing lumbar vertebrae with anatomic posterior alignment. Straightening of the usual lumbar lordosis. No fractures. Slight thoracolumbar dextroscoliosis. Ankylosis of the facet joints from T12-L1 through L3-4. Mild disc space narrowing at L3-4 and L4-5. Sacroiliac joints anatomically aligned with mild degenerative changes, but without evidence of ankylosis.   IMPRESSION: 1. No acute osseous abnormality. 2. Ankylosis of the facet joints from T12-L1 through L3-4. While nonspecific, this can be a manifestation of ankylosing spondylitis. 3. Mild degenerative disc disease at L3-4 and L4-5.  PATIENT SURVEYS:  FOTO 49  SCREENING FOR  RED FLAGS: Bowel or bladder incontinence: Yes:   Spinal tumors: No Cauda equina syndrome: No Compression fracture: No Abdominal aneurysm: No  COGNITION:  Overall cognitive status: Within functional limits for tasks assessed     SENSATION: Present but very limited in B/L ankles secondary to neuropathy     POSTURE: rounded shoulders, forward head, decreased lumbar lordosis, and increased thoracic kyphosis  PALPATION: TTP b/L sides of sacrum   LUMBAR ROM:   Active  A/PROM  eval  Flexion WFL  Extension 60% limited  Right lateral flexion To mid thigh  Left lateral flexion To top of knee  Right rotation Limited in comparison to left rotation 20% limited *  Left rotation WFL*   (Blank rows = not tested)  * pain   LOWER EXTREMITY MMT:    MMT Right eval Left eval  Hip flexion 5 5  Hip extension 3+ 3+  Hip abduction 4 4  Hip adduction 5 5  Hip internal rotation    Hip external rotation    Knee flexion 5 5  Knee extension 5 5  Ankle dorsiflexion 5 5  Ankle plantarflexion    Ankle inversion    Ankle eversion     (Blank rows = not tested)  LUMBAR SPECIAL TESTS:  Slump test: Negative  FUNCTIONAL TESTS:  2 minute walk test: 508'  GAIT: Distance walked: 70' Assistive device utilized: None Level of assistance: Complete Independence Comments: normal gait but reports back stiffness with increased time walked    TODAY'S TREATMENT  evaluation   PATIENT EDUCATION:  PATIENT EDUCATION:  Education details: Patient educated on exam findings, POC, scope of PT. Person educated: Patient Education method: Explanation, Demonstration, and Handouts Education comprehension: verbalized understanding, returned demonstration, verbal cues required, and tactile cues required    HOME EXERCISE PROGRAM: Initiate at first session  ASSESSMENT:  CLINICAL IMPRESSION: Patient a 61 y.o. female who was seen today for physical therapy evaluation and treatment for Low back pain and  upper thoracic pain.  Patient reports that she is physically independent and is staying motivated to live a healthier lifestyle but is experiencing pain in her back with her ADLS and walking. Pt is more limited with right side lateral trunk flexion and rotation as compared to left. Patient presents with decreased lumbar lordosis, increased thoracic kyphosis, forward head and rounded shoulders. Patient will benefit from skilled PT services to improve posture, educate in appropriate exercises for HEP and work towards decreased pain.    OBJECTIVE IMPAIRMENTS decreased endurance, decreased ROM, postural dysfunction, and pain.   ACTIVITY LIMITATIONS carrying, lifting, bending, and standing  PARTICIPATION LIMITATIONS: cleaning, laundry, shopping, community activity, and yard work  [place] and 3+ comorbidities: Back pain, BMI over 30, Congestive Heart Failure or Heart Disease, Depression, Diabetes Type I or II, High Blood Pressure, Incontinence, Sleep dysfunction  are also affecting patient's functional outcome.   REHAB POTENTIAL: Good  CLINICAL DECISION MAKING: Stable/uncomplicated  EVALUATION COMPLEXITY: Moderate   GOALS: Goals reviewed with patient? Yes  SHORT TERM GOALS: Target date: 03/11/2022  Patient will be independent with HEP in order to improve functional outcomes. Baseline:  Goal status: INITIAL  2.  Patient will report at least 25% improvement in symptoms for improved quality of life. Baseline:  Goal status: INITIAL   LONG TERM GOALS: Target date: 03/25/2022  Patient will report at least 75% improvement in symptoms for improved quality of life. Baseline:  Goal status: INITIAL  2.  Patient will improve FOTO score by at least 10 points in order to indicate improved tolerance to activity. Baseline: 49 Goal status: INITIAL  3.  Patient will improve b/l glute strength to  greater than or equal to 4/5 to reduce back strain and improve hip extension for better  posture Baseline: 3+/5 Goal status: INITIAL     PLAN: PT FREQUENCY: 2x/week  PT DURATION: 4 weeks  PLANNED INTERVENTIONS: PLANNED INTERVENTIONS: Therapeutic exercises, Therapeutic activity, Neuromuscular re-education, Balance training, Gait training, Patient/Family education, Joint manipulation, Joint mobilization, Stair training, Orthotic/Fit training, DME instructions, Aquatic Therapy, Dry Needling, Electrical stimulation, Spinal manipulation, Spinal mobilization, Cryotherapy, Moist heat, Compression bandaging, scar mobilization, Splintting, Taping, Traction, Ultrasound, Ionotophoresis '4mg'$ /ml Dexamethasone, and Manual therapy  PLAN FOR NEXT SESSION: initiate HEP including chest opening, postural strengthening, PPT, core activation, lumbar mobility   Arlone Lenhardt, PT 02/25/2022, 12:20 PM

## 2022-03-04 ENCOUNTER — Ambulatory Visit (HOSPITAL_COMMUNITY): Payer: BC Managed Care – PPO | Attending: Plastic Surgery | Admitting: Physical Therapy

## 2022-03-04 DIAGNOSIS — M5459 Other low back pain: Secondary | ICD-10-CM | POA: Insufficient documentation

## 2022-03-04 DIAGNOSIS — R293 Abnormal posture: Secondary | ICD-10-CM | POA: Insufficient documentation

## 2022-03-04 NOTE — Therapy (Addendum)
OUTPATIENT PHYSICAL THERAPY THORACOLUMBAR EVALUATION   Patient Name: Laurie Mckinney MRN: [id number] DOB:10/06/1960, 61 y.o., female Today's Date: 03/04/2022   PT End of Session - 03/04/22 0840     Visit Number 2    Number of Visits 8    Date for PT Re-Evaluation 03/25/22    Authorization Type [employer] COMM PPO (no copay, no auth)    Progress Note Due on Visit 8    PT Start Time 0840   pt late for appointment   PT Stop Time 0915    PT Time Calculation (min) 35 min    Activity Tolerance Patient tolerated treatment well    Behavior During Therapy WFL for tasks assessed/performed             Past Medical History:  Diagnosis Date   Adjustment insomnia    SHIFT WORK   Allergy    RHINITIS   Asthma    Atrial flutter ([place]) 02/26/15   [facility]   Back pain    Bursitis    right hip   Cataract    see last eye eexam note    Constipation    Depression    Diabetes mellitus without complication ([place])    History of atrial fibrillation    History of cancer    Hypertension    Incontinence    Joint pain    Migraine headache    Mobitz type 2 second degree heart block    Neuropathy    Obesity    Other fatigue    Shortness of breath on exertion    Sleep apnea    Past Surgical History:  Procedure Laterality Date   ABDOMINAL HYSTERECTOMY     ATRIAL FIBRILLATION ABLATION N/A 10/27/2019   Procedure: ATRIAL FIBRILLATION ABLATION;  Surgeon: Constance Haw, MD;  Location: [place] [facility];  Service: Cardiovascular;  Laterality: N/A;   CARDIOVERSION N/A 05/25/2019   Procedure: CARDIOVERSION;  Surgeon: Arnoldo Lenis, MD;  Location: [facility];  Service: Endoscopy;  Laterality: N/A;   COLONOSCOPY  2011   Dr.Brodie   ROBOTIC ASSISTED TOTAL HYSTERECTOMY WITH BILATERAL SALPINGO OOPHERECTOMY Bilateral 02/05/2015   Procedure: ROBOTIC ASSISTED TOTAL LAPAROSCOPIC HYSTERECTOMY WITH BILATERAL SALPINGO OOPHORECTOMY;  Surgeon: Everitt Amber, MD;  Location: [facility];  Service:  Gynecology;  Laterality: Bilateral;   Patient Active Problem List   Diagnosis Date Noted   Peripheral neuropathy 12/18/2021   Symptomatic mammary hypertrophy 12/02/2021   Recurrent UTI 09/02/2021   Lacerations of multiple sites of left arm 08/11/2021   Bilateral pendulous breasts 08/11/2021   Anxiety and depression 02/12/2021   Class 2 severe obesity with serious comorbidity and body mass index (BMI) of 37.0 to 37.9 in adult [facility]) 12/12/2020   Hyperlipidemia associated with type 2 diabetes mellitus ([place]) 11/14/2020   History of hysterectomy for cancer 08/19/2020   Secondary hypercoagulable state ([place]) 11/22/2019   Female stress incontinence 10/04/2019   Diabetic neuropathy ([place]) 09/14/2019   OSA (obstructive sleep apnea) 07/13/2019   Varicose veins of lower extremity 04/14/2019   Atrial fibrillation ([place]) 04/14/2019   Hearing loss 04/13/2019   Diabetes mellitus ([place]) 09/29/2018   Second degree heart block 05/05/2018   Chronic low back pain without sciatica 05/05/2018   Hypertension associated with diabetes ([place]) 09/20/2017   Actinic keratosis 02/24/2017   Dysthymia 01/10/2016   History of endometrial cancer 02/05/2015    Class: Stage 1   History of migraine headaches 03/18/2011   Allergic rhinitis due to pollen 03/18/2011  Obesity (BMI 30-39.9) 03/18/2011    PCP: Jill Alexanders  REFERRING PROVIDER: Wallace Going, DO   REFERRING DIAG:   [id number] (ICD-10-CM) - Chronic bilateral low back pain without sciatica   Rationale for Evaluation and Treatment Rehabilitation  THERAPY DIAG:  Other low back pain  Abnormal posture  ONSET DATE: 2019  SUBJECTIVE:                                                                                                                                                                                           SUBJECTIVE STATEMENT:   Pt states her back is about the same as always just sore, minimal pain.  PERTINENT HISTORY:  Low back  pain B/L, pain in upper thoracic region.  Occasionally hands swell. Bursitis of right hip  PAIN:  Are you having pain? Yes: NPRS scale: 2/10 Pain location: low back  Pain description: dull pain in back  Aggravating factors: standing at workstation and bent over, ie washing dishes Relieving factors: shifting weight on legs, propping leg up on stool, sit down    PRECAUTIONS: None    PATIENT GOALS  to improve back pain, feel good and be healthy   OBJECTIVE:   DIAGNOSTIC FINDINGS:  FINDINGS: Five non-rib-bearing lumbar vertebrae with anatomic posterior alignment. Straightening of the usual lumbar lordosis. No fractures. Slight thoracolumbar dextroscoliosis. Ankylosis of the facet joints from T12-L1 through L3-4. Mild disc space narrowing at L3-4 and L4-5. Sacroiliac joints anatomically aligned with mild degenerative changes, but without evidence of ankylosis.   IMPRESSION: 1. No acute osseous abnormality. 2. Ankylosis of the facet joints from T12-L1 through L3-4. While nonspecific, this can be a manifestation of ankylosing spondylitis. 3. Mild degenerative disc disease at L3-4 and L4-5.  PATIENT SURVEYS:  FOTO 49    LUMBAR ROM:   Active  A/PROM  eval  Flexion WFL  Extension 60% limited  Right lateral flexion To mid thigh  Left lateral flexion To top of knee  Right rotation Limited in comparison to left rotation 20% limited *  Left rotation WFL*   (Blank rows = not tested)  * pain   LOWER EXTREMITY MMT:    MMT Right eval Left eval  Hip flexion 5 5  Hip extension 3+ 3+  Hip abduction 4 4  Hip adduction 5 5  Hip internal rotation    Hip external rotation    Knee flexion 5 5  Knee extension 5 5  Ankle dorsiflexion 5 5  Ankle plantarflexion    Ankle inversion    Ankle eversion     (Blank rows = not tested) FUNCTIONAL TESTS:  2 minute walk  test: 43'    TODAY'S TREATMENT  5/31 Sitting:  Tall posture x 5 Scapular retraction x 5 Abdominal set x  10 Thoracic excursion x 3 Supine: Bridge x 10 Knee to chest x 3 for 30"    PATIENT EDUCATION:  PATIENT EDUCATION: HEP [id number]                         Education method: Explanation, Demonstration, and Handouts Education comprehension: verbalized understanding, returned demonstration, verbal cues required, and tactile cues required    HOME EXERCISE PROGRAM: Tall posture x 5 Scapular retraction x 5 Abdominal set x 10 Thoracic excursion x 3 Supine: Bridge x 10  ASSESSMENT:  CLINICAL IMPRESSION: Therapist  reviewed goals with patient.  Began education on body mechanics for bed mobility.  Pt will need education on body mechanics for lifting and                                      Reaching as well.  Initiated initital HEP for posture. OBJECTIVE IMPAIRMENTS decreased endurance, decreased ROM, postural dysfunction, and pain.   ACTIVITY LIMITATIONS carrying, lifting, bending, and standing  PARTICIPATION LIMITATIONS: cleaning, laundry, shopping, community activity, and yard work  [place] and 3+ comorbidities: Back pain, BMI over 30, Congestive Heart Failure or Heart Disease, Depression, Diabetes Type I or II, High Blood Pressure, Incontinence, Sleep dysfunction  are also affecting patient's functional outcome.   REHAB POTENTIAL: Good  CLINICAL DECISION MAKING: Stable/uncomplicated  EVALUATION COMPLEXITY: Moderate   GOALS: Goals reviewed with patient? Yes  SHORT TERM GOALS: Target date: 03/18/2022  Patient will be independent with HEP in order to improve functional outcomes. Baseline:  Goal status: INITIAL  2.  Patient will report at least 25% improvement in symptoms for improved quality of life. Baseline:  Goal status: INITIAL   LONG TERM GOALS: Target date: 04/01/2022  Patient will report at least 75% improvement in symptoms for improved quality of life. Baseline:  Goal status: INITIAL  2.  Patient will improve FOTO score by at least 10 points in  order to indicate improved tolerance to activity. Baseline: 49 Goal status: INITIAL  3.  Patient will improve b/l glute strength to greater than or equal to 4/5 to reduce back strain and improve hip extension for better posture Baseline: 3+/5 Goal status: INITIAL     PLAN:   PT FREQUENCY: 2x/week  PT DURATION: 4 weeks  PLANNED INTERVENTIONS: PLANNED INTERVENTIONS: Therapeutic exercises, Therapeutic activity, Neuromuscular re-education, Balance training, Gait training, Patient/Family education, Joint manipulation, Joint mobilization, Stair training, Orthotic/Fit training, DME instructions, Aquatic Therapy, Dry Needling, Electrical stimulation, Spinal manipulation, Spinal mobilization, Cryotherapy, Moist heat, Compression bandaging, scar mobilization, Splintting, Taping, Traction, Ultrasound, Ionotophoresis '4mg'$ /ml Dexamethasone, and Manual therapy  PLAN FOR NEXT SESSION: begin postural theraband exercises,  chest opening, postural strengthening, PPT, core activation, lumbar mobility Rayetta Humphrey, PT CLT [phone]  03/04/2022, 9:15AM

## 2022-03-09 ENCOUNTER — Encounter: Payer: Self-pay | Admitting: Family Medicine

## 2022-03-09 ENCOUNTER — Ambulatory Visit: Payer: BC Managed Care – PPO | Admitting: Family Medicine

## 2022-03-09 VITALS — BP 120/72 | HR 65 | Temp 97.7°F | Ht 71.0 in | Wt 218.4 lb

## 2022-03-09 DIAGNOSIS — I4891 Unspecified atrial fibrillation: Secondary | ICD-10-CM

## 2022-03-09 DIAGNOSIS — E1159 Type 2 diabetes mellitus with other circulatory complications: Secondary | ICD-10-CM

## 2022-03-09 DIAGNOSIS — I152 Hypertension secondary to endocrine disorders: Secondary | ICD-10-CM | POA: Diagnosis not present

## 2022-03-09 DIAGNOSIS — Z90710 Acquired absence of both cervix and uterus: Secondary | ICD-10-CM

## 2022-03-09 DIAGNOSIS — E1169 Type 2 diabetes mellitus with other specified complication: Secondary | ICD-10-CM | POA: Diagnosis not present

## 2022-03-09 DIAGNOSIS — Z0000 Encounter for general adult medical examination without abnormal findings: Secondary | ICD-10-CM | POA: Diagnosis not present

## 2022-03-09 DIAGNOSIS — G6289 Other specified polyneuropathies: Secondary | ICD-10-CM

## 2022-03-09 DIAGNOSIS — Z6837 Body mass index (BMI) 37.0-37.9, adult: Secondary | ICD-10-CM

## 2022-03-09 DIAGNOSIS — G4733 Obstructive sleep apnea (adult) (pediatric): Secondary | ICD-10-CM

## 2022-03-09 DIAGNOSIS — E785 Hyperlipidemia, unspecified: Secondary | ICD-10-CM | POA: Diagnosis not present

## 2022-03-09 DIAGNOSIS — D6869 Other thrombophilia: Secondary | ICD-10-CM

## 2022-03-09 DIAGNOSIS — I7121 Aneurysm of the ascending aorta, without rupture: Secondary | ICD-10-CM

## 2022-03-09 DIAGNOSIS — F341 Dysthymic disorder: Secondary | ICD-10-CM

## 2022-03-09 LAB — POCT UA - MICROALBUMIN
Albumin/Creatinine Ratio, Urine, POC: <7.1
Creatinine, POC: 73 mg/dL
Microalbumin Ur, POC: <5 mg/L

## 2022-03-09 LAB — POCT GLYCOSYLATED HEMOGLOBIN (HGB A1C): Hemoglobin A1C: 5.5 % (ref 4.0–5.6)

## 2022-03-09 MED ORDER — ATORVASTATIN CALCIUM 10 MG PO TABS: 10.0000 mg | ORAL_TABLET | Freq: Every day | ORAL | 3 refills | Status: DC

## 2022-03-09 MED ORDER — LISINOPRIL-HYDROCHLOROTHIAZIDE 20-25 MG PO TABS: 1.0000 | ORAL_TABLET | Freq: Every day | ORAL | 3 refills | Status: DC

## 2022-03-09 MED ORDER — BUPROPION HCL ER (SR) 200 MG PO TB12: 200.0000 mg | ORAL_TABLET | Freq: Every day | ORAL | 1 refills | Status: DC

## 2022-03-09 MED ORDER — PAROXETINE HCL 20 MG PO TABS: 20.0000 mg | ORAL_TABLET | Freq: Every day | ORAL | 1 refills | Status: DC

## 2022-03-09 NOTE — Progress Notes (Signed)
Complete physical exam  Patient: Laurie Mckinney   DOB: [birth year]   61 y.o. Female  MRN: [id number]  Subjective:    Chief Complaint  Patient presents with   Annual Exam    FASTING     Laurie Mckinney is a 61 y.o. female who presents today for a complete physical exam. She reports consuming a diabetic, less than 2,000 calorie diet.  Staying active at least 20 ,min. QD  She generally feels well. She reports sleeping light. She does have OSA and is presently on CPAP and doing quite nicely on that.  She has lost a significant amount of weight and is very happy with this.  She has been exercising regularly, checking her CBGs.  She is on Ozempic and is being followed by medical weight loss and wellness.  She does not smoke or drink.  Psychologically she is doing quite nicely and continues to do quite nicely on Paxil as well as Wellbutrin.  She is seeing cardiology for follow-up on her AF and is doing well on Xarelto.  She is taking 2 medications to help with her OAB which seems to be going well.  She has also contacted plastic surgery to help with possible breast reduction.  Continues on lisinopril/HCTZ.  She does have a neuropathy but presently is on no medication.  She is going to try some OTC medications to see if that will help.  Family and social history was reviewed.  Her marriage is going quite well.   Most recent fall risk assessment:    02/10/2021    9:38 AM  Fall Risk   Falls in the past year? 0  Number falls in past yr: 0  Injury with Fall? 0  Risk for fall due to : No Fall Risks  Follow up Falls evaluation completed     Most recent depression screenings:    03/09/2022    9:25 AM 12/18/2021    8:29 AM  PHQ 2/9 Scores  PHQ - 2 Score 0 0  PHQ- 9 Score 3       Patient Active Problem List   Diagnosis Date Noted   Peripheral neuropathy 12/18/2021   Symptomatic mammary hypertrophy 12/02/2021   Recurrent UTI 09/02/2021   Lacerations of multiple sites of left arm 08/11/2021    Bilateral pendulous breasts 08/11/2021   Anxiety and depression 02/12/2021   Class 2 severe obesity with serious comorbidity and body mass index (BMI) of 37.0 to 37.9 in adult ([place]) 12/12/2020   Hyperlipidemia associated with type 2 diabetes mellitus ([place]) 11/14/2020   History of hysterectomy for cancer 08/19/2020   Secondary hypercoagulable state ([place]) 11/22/2019   Female stress incontinence 10/04/2019   Diabetic neuropathy ([place]) 09/14/2019   OSA (obstructive sleep apnea) 07/13/2019   Varicose veins of lower extremity 04/14/2019   Atrial fibrillation ([place]) 04/14/2019   Hearing loss 04/13/2019   Diabetes mellitus ([place]) 09/29/2018   Second degree heart block 05/05/2018   Chronic low back pain without sciatica 05/05/2018   Hypertension associated with diabetes ([place]) 09/20/2017   Actinic keratosis 02/24/2017   Dysthymia 01/10/2016   History of endometrial cancer 02/05/2015    Class: Stage 1   History of migraine headaches 03/18/2011   Allergic rhinitis due to pollen 03/18/2011   Obesity (BMI 30-39.9) 03/18/2011   Past Medical History:  Diagnosis Date   Adjustment insomnia    SHIFT WORK   Allergy    RHINITIS   Asthma    Atrial flutter ([place]) 02/26/15  [facility]   Back pain    Bursitis    right hip   Cataract    see last eye eexam note    Constipation    Depression    Diabetes mellitus without complication (HCC)    History of atrial fibrillation    History of cancer    Hypertension    Incontinence    Joint pain    Migraine headache    Mobitz type 2 second degree heart block    Neuropathy    Obesity    Other fatigue    Shortness of breath on exertion    Sleep apnea    Past Surgical History:  Procedure Laterality Date   ABDOMINAL HYSTERECTOMY     ATRIAL FIBRILLATION ABLATION N/A 10/27/2019   Procedure: ATRIAL FIBRILLATION ABLATION;  Surgeon: Constance Haw, MD;  Location: [place] [facility];  Service: Cardiovascular;  Laterality: N/A;    CARDIOVERSION N/A 05/25/2019   Procedure: CARDIOVERSION;  Surgeon: Arnoldo Lenis, MD;  Location: [facility];  Service: Endoscopy;  Laterality: N/A;   COLONOSCOPY  2011   Dr.Brodie   ROBOTIC ASSISTED TOTAL HYSTERECTOMY WITH BILATERAL SALPINGO OOPHERECTOMY Bilateral 02/05/2015   Procedure: ROBOTIC ASSISTED TOTAL LAPAROSCOPIC HYSTERECTOMY WITH BILATERAL SALPINGO OOPHORECTOMY;  Surgeon: Everitt Amber, MD;  Location: [facility];  Service: Gynecology;  Laterality: Bilateral;   Social History   Tobacco Use   Smoking status: Never   Smokeless tobacco: Never  Vaping Use   Vaping Use: Never used  Substance Use Topics   Alcohol use: Yes    Alcohol/week: 1.0 standard drink    Types: 1 Standard drinks or equivalent per week   Drug use: No   Family History  Problem Relation Age of Onset   Arthritis Mother    Heart disease Father    Hypertension Father    Hyperlipidemia Father    Sudden death Father    Neuropathy Brother        both legs    Diabetes Brother        no treatment that the pt knows of    Other Brother        drank a 6 pack of beer daily x 30 years    Sleep apnea Neg Hx    No Known Allergies    Patient Care Team: Denita Lung, MD as PCP - General (Family Medicine) Arnoldo Lenis, MD as PCP - Cardiology (Cardiology) Constance Haw, MD as PCP - Electrophysiology (Cardiology)   Outpatient Medications Prior to Visit  Medication Sig   acetaminophen (TYLENOL) 650 MG CR tablet Take 650 mg by mouth every 8 (eight) hours as needed for pain.   gabapentin (NEURONTIN) 100 MG capsule Take 2 capsules in AM and 2 capsules at bedtime   Lancets (ONETOUCH DELICA PLUS [id number]) MISC USE AND DISCARD 1 LANCET   DAILY   Misc Natural Products (IMMUNE FORMULA PO) Take 2 tablets by mouth daily. immuneti   Multiple Vitamins-Minerals (CENTRUM SILVER 50+WOMEN) TABS Take 1 tablet by mouth daily.   mupirocin ointment (BACTROBAN) 2 % Apply topically 2 (two) times daily. (Patient taking  differently: Apply topically as needed.)   ONETOUCH VERIO test strip USE AND DISCARD 1 TEST     STRIP AS NEEDED AS         INSTRUCTED   oxybutynin (DITROPAN-XL) 10 MG 24 hr tablet TAKE 1 TABLET AT BEDTIME   rivaroxaban (XARELTO) 20 MG TABS tablet Take 1 tablet (20 mg total) by mouth  daily with supper.   Semaglutide, 1 MG/DOSE, 4 MG/3ML SOPN Inject 1 mg as directed once a week.   UNABLE TO FIND Take 1 tablet by mouth Nightly. Med Name: Theotis Burrow   Vibegron (GEMTESA) 75 MG TABS Take by mouth at bedtime.   [DISCONTINUED] atorvastatin (LIPITOR) 10 MG tablet TAKE 1 TABLET DAILY   [DISCONTINUED] buPROPion (WELLBUTRIN SR) 200 MG 12 hr tablet Take 1 tablet (200 mg total) by mouth daily.   [DISCONTINUED] lisinopril-hydrochlorothiazide (ZESTORETIC) 20-25 MG tablet TAKE 1 TABLET DAILY (DOSE  INCREASE)   [DISCONTINUED] PARoxetine (PAXIL) 20 MG tablet TAKE 1 TABLET BY MOUTH EVERY DAY   Omega-3 Fatty Acids (FISH OIL) 1200 MG CAPS Take 2 capsules by mouth at bedtime. (Patient not taking: Reported on 03/09/2022)   No facility-administered medications prior to visit.    Review of Systems  All other systems reviewed and are negative.        Objective:     BP 120/72   Pulse 65   Temp 97.7 F (36.5 C)   Ht '5\' 11"'  (1.803 m)   Wt 218 lb 6.4 oz (99.1 kg)   LMP 01/26/2011   SpO2 95%   BMI 30.46 kg/m  BP Readings from Last 3 Encounters:  03/09/22 120/72  02/02/22 114/76  01/14/22 116/84      Physical Exam  Alert and in no distress. Tympanic membranes and canals are normal. Pharyngeal area is normal. Neck is supple without adenopathy or thyromegaly. Cardiac exam shows a regular sinus rhythm without murmurs or gallops. Lungs are clear to auscultation.  Last CBC Lab Results  Component Value Date   WBC 7.8 11/14/2020   HGB 14.5 11/14/2020   HCT 43.2 11/14/2020   MCV 92 11/14/2020   MCH 30.7 11/14/2020   RDW 12.3 11/14/2020   PLT 289 93/81/8299   Last metabolic panel Lab Results  Component  Value Date   GLUCOSE 177 (H) 05/13/2021   NA 138 05/13/2021   K 4.8 05/13/2021   CL 99 05/13/2021   CO2 20 05/13/2021   BUN 17 05/13/2021   CREATININE 0.72 05/13/2021   EGFR 94 05/13/2021   CALCIUM 9.4 05/13/2021   PROT 6.8 05/13/2021   ALBUMIN 4.2 05/13/2021   LABGLOB 2.6 05/13/2021   AGRATIO 1.6 05/13/2021   BILITOT 0.5 05/13/2021   ALKPHOS 92 05/13/2021   AST 18 05/13/2021   ALT 14 05/13/2021   ANIONGAP 8 05/23/2019   Last lipids Lab Results  Component Value Date   CHOL 135 11/14/2020   HDL 62 11/14/2020   LDLCALC 62 11/14/2020   TRIG 45 11/14/2020   CHOLHDL 2.6 10/05/2019   Last hemoglobin A1c Lab Results  Component Value Date   HGBA1C 5.5 03/09/2022   Last thyroid functions Lab Results  Component Value Date   TSH 0.724 11/14/2020   T3TOTAL 119 11/14/2020   T4TOTAL 6.4 11/14/2020   Last vitamin D Lab Results  Component Value Date   VD25OH 40.5 11/14/2020        Assessment & Plan:    Routine general medical examination at a health care facility  Type 2 diabetes mellitus with other specified complication, without long-term current use of insulin ([place]) - Plan: CBC with Differential/Platelet, Comprehensive metabolic panel, Lipid panel, POCT glycosylated hemoglobin (Hb A1C), POCT UA - Microalbumin  Atrial fibrillation, unspecified type (HCC)  Obesity with current BMI 29.7  Hypertension associated with diabetes ([place]) - Plan: CBC with Differential/Platelet, Comprehensive metabolic panel, lisinopril-hydrochlorothiazide (ZESTORETIC) 20-25 MG tablet  Hyperlipidemia associated with  type 2 diabetes mellitus ([place]) - Plan: Lipid panel, atorvastatin (LIPITOR) 10 MG tablet  Dysthymia - Plan: buPROPion (WELLBUTRIN SR) 200 MG 12 hr tablet, PARoxetine (PAXIL) 20 MG tablet  Other polyneuropathy  Secondary hypercoagulable state ([place])  OSA (obstructive sleep apnea)  History of hysterectomy for cancer - 2015  Aneurysm of ascending aorta without rupture ([place]) -  Plan: CT Angio Chest W/Cm &/Or Wo Cm  Immunization History  Administered Date(s) Administered   Influenza Inj Mdck Quad Pf 06/19/2018   Influenza Split 06/05/2012, 05/24/2013, 06/15/2019   Influenza Whole 07/16/2008, 06/05/2009, 07/05/2010   Influenza,inj,Quad PF,6+ Mos 06/07/2017, 06/15/2019, 06/20/2021   Influenza-Unspecified 06/05/2013, 06/19/2014, 06/21/2015, 05/15/2016, 06/07/2017, 06/19/2018, 06/15/2019, 08/14/2020   MMR 03/06/1969   Moderna SARS-COV2 Booster Vaccination 02/10/2021   Moderna Sars-Covid-2 Vaccination 08/14/2020, 02/10/2021   PFIZER(Purple Top)SARS-COV-2 Vaccination 12/28/2019, 01/20/2020   Pneumococcal Conjugate-13 06/15/2019   Pneumococcal Polysaccharide-23 02/10/2021   Td 03/04/2005   Tdap 08/24/2012, 09/04/2013   Zoster Recombinat (Shingrix) 05/05/2018, 07/19/2018    Health Maintenance  Topic Date Due   COVID-19 Vaccine (5 - Booster) 04/07/2021   OPHTHALMOLOGY EXAM  04/18/2022   INFLUENZA VACCINE  05/05/2022   HEMOGLOBIN A1C  09/08/2022   Fecal DNA (Cologuard)  11/06/2022   FOOT EXAM  03/10/2023   MAMMOGRAM  03/13/2023   TETANUS/TDAP  09/05/2023   Hepatitis C Screening  Completed   HIV Screening  Completed   Zoster Vaccines- Shingrix  Completed   HPV VACCINES  Aged Out   PAP SMEAR-Modifier  Discontinued   COLONOSCOPY (Pts 45-50yr Insurance coverage will need to be confirmed)  Discontinued    Discussed health benefits of physical activity, and encouraged her to engage in regular exercise appropriate for her age and condition.  Problem List Items Addressed This Visit     Atrial fibrillation (HCC)   Relevant Medications   lisinopril-hydrochlorothiazide (ZESTORETIC) 20-25 MG tablet   atorvastatin (LIPITOR) 10 MG tablet   Class 2 severe obesity with serious comorbidity and body mass index (BMI) of 37.0 to 37.9 in adult (HCC)   Diabetes mellitus (HCC)   Relevant Medications   lisinopril-hydrochlorothiazide (ZESTORETIC) 20-25 MG tablet    atorvastatin (LIPITOR) 10 MG tablet   Other Relevant Orders   CBC with Differential/Platelet   Comprehensive metabolic panel   Lipid panel   POCT glycosylated hemoglobin (Hb A1C) (Completed)   POCT UA - Microalbumin (Completed)   Dysthymia   Relevant Medications   buPROPion (WELLBUTRIN SR) 200 MG 12 hr tablet   PARoxetine (PAXIL) 20 MG tablet   History of hysterectomy for cancer   Hyperlipidemia associated with type 2 diabetes mellitus (HCC)   Relevant Medications   lisinopril-hydrochlorothiazide (ZESTORETIC) 20-25 MG tablet   atorvastatin (LIPITOR) 10 MG tablet   Other Relevant Orders   Lipid panel   Hypertension associated with diabetes ([place]   Relevant Medications   lisinopril-hydrochlorothiazide (ZESTORETIC) 20-25 MG tablet   atorvastatin (LIPITOR) 10 MG tablet   Other Relevant Orders   CBC with Differential/Platelet   Comprehensive metabolic panel   OSA (obstructive sleep apnea)   Peripheral neuropathy   Relevant Medications   buPROPion (WELLBUTRIN SR) 200 MG 12 hr tablet   PARoxetine (PAXIL) 20 MG tablet   [place]coagulable state ([place]   Other Visit Diagnoses     Routine general medical examination at a health care facility    -  Primary   Aneurysm of ascending aorta without rupture (HCC)       Relevant Medications   lisinopril-hydrochlorothiazide (ZESTORETIC) 20-25 MG  tablet   atorvastatin (LIPITOR) 10 MG tablet   Other Relevant Orders   CT Angio Chest W/Cm &/Or Wo Cm         Jill Alexanders, MD

## 2022-03-10 ENCOUNTER — Encounter (INDEPENDENT_AMBULATORY_CARE_PROVIDER_SITE_OTHER): Payer: Self-pay | Admitting: Adult Health

## 2022-03-10 ENCOUNTER — Ambulatory Visit (INDEPENDENT_AMBULATORY_CARE_PROVIDER_SITE_OTHER): Payer: BC Managed Care – PPO | Admitting: Adult Health

## 2022-03-10 VITALS — BP 101/67 | HR 73 | Temp 97.8°F | Ht 71.0 in | Wt 213.0 lb

## 2022-03-10 DIAGNOSIS — N6489 Other specified disorders of breast: Secondary | ICD-10-CM | POA: Diagnosis not present

## 2022-03-10 DIAGNOSIS — E1159 Type 2 diabetes mellitus with other circulatory complications: Secondary | ICD-10-CM

## 2022-03-10 DIAGNOSIS — E669 Obesity, unspecified: Secondary | ICD-10-CM

## 2022-03-10 DIAGNOSIS — Z6829 Body mass index (BMI) 29.0-29.9, adult: Secondary | ICD-10-CM

## 2022-03-10 DIAGNOSIS — Z7985 Long-term (current) use of injectable non-insulin antidiabetic drugs: Secondary | ICD-10-CM

## 2022-03-10 DIAGNOSIS — I152 Hypertension secondary to endocrine disorders: Secondary | ICD-10-CM

## 2022-03-10 DIAGNOSIS — E1169 Type 2 diabetes mellitus with other specified complication: Secondary | ICD-10-CM | POA: Diagnosis not present

## 2022-03-10 LAB — COMPREHENSIVE METABOLIC PANEL
ALT: 18 IU/L (ref 0–32)
AST: 22 IU/L (ref 0–40)
Albumin/Globulin Ratio: 1.6 (ref 1.2–2.2)
Albumin: 4.4 g/dL (ref 3.8–4.8)
Alkaline Phosphatase: 77 IU/L (ref 44–121)
BUN/Creatinine Ratio: 16 (ref 12–28)
BUN: 13 mg/dL (ref 8–27)
Bilirubin Total: 0.5 mg/dL (ref 0.0–1.2)
CO2: 25 mmol/L (ref 20–29)
Calcium: 9.7 mg/dL (ref 8.7–10.3)
Chloride: 102 mmol/L (ref 96–106)
Creatinine, Ser: 0.79 mg/dL (ref 0.57–1.00)
Globulin, Total: 2.8 g/dL (ref 1.5–4.5)
Glucose: 102 mg/dL — ABNORMAL HIGH (ref 70–99)
Potassium: 4.5 mmol/L (ref 3.5–5.2)
Sodium: 141 mmol/L (ref 134–144)
Total Protein: 7.2 g/dL (ref 6.0–8.5)
eGFR: 85 mL/min/{1.73_m2} (ref >59)

## 2022-03-10 LAB — CBC WITH DIFFERENTIAL/PLATELET
Basophils Absolute: 0 10*3/uL (ref 0.0–0.2)
Basos: 0 %
EOS (ABSOLUTE): 0.1 10*3/uL (ref 0.0–0.4)
Eos: 1 %
Hematocrit: 40.1 % (ref 34.0–46.6)
Hemoglobin: 13.6 g/dL (ref 11.1–15.9)
Immature Grans (Abs): 0 10*3/uL (ref 0.0–0.1)
Immature Granulocytes: 0 %
Lymphocytes Absolute: 1.7 10*3/uL (ref 0.7–3.1)
Lymphs: 21 %
MCH: 30.8 pg (ref 26.6–33.0)
MCHC: 33.9 g/dL (ref 31.5–35.7)
MCV: 91 fL (ref 79–97)
Monocytes Absolute: 0.6 10*3/uL (ref 0.1–0.9)
Monocytes: 7 %
Neutrophils Absolute: 5.7 10*3/uL (ref 1.4–7.0)
Neutrophils: 71 %
Platelets: 271 10*3/uL (ref 150–450)
RBC: 4.42 x10E6/uL (ref 3.77–5.28)
RDW: 11.9 % (ref 11.7–15.4)
WBC: 8 10*3/uL (ref 3.4–10.8)

## 2022-03-10 LAB — LIPID PANEL
Chol/HDL Ratio: 1.7 ratio (ref 0.0–4.4)
Cholesterol, Total: 108 mg/dL (ref 100–199)
HDL: 62 mg/dL (ref >39)
LDL Chol Calc (NIH): 37 mg/dL (ref 0–99)
Triglycerides: 27 mg/dL (ref 0–149)
VLDL Cholesterol Cal: 9 mg/dL (ref 5–40)

## 2022-03-10 MED ORDER — SEMAGLUTIDE (1 MG/DOSE) 4 MG/3ML ~~LOC~~ SOPN: 1.0000 mg | PEN_INJECTOR | SUBCUTANEOUS | 0 refills | Status: DC

## 2022-03-11 ENCOUNTER — Encounter: Payer: Self-pay | Admitting: Family Medicine

## 2022-03-11 ENCOUNTER — Ambulatory Visit (HOSPITAL_COMMUNITY): Payer: BC Managed Care – PPO | Attending: Plastic Surgery

## 2022-03-11 ENCOUNTER — Encounter (HOSPITAL_COMMUNITY): Payer: Self-pay

## 2022-03-11 DIAGNOSIS — R293 Abnormal posture: Secondary | ICD-10-CM | POA: Diagnosis not present

## 2022-03-11 DIAGNOSIS — M5459 Other low back pain: Secondary | ICD-10-CM | POA: Insufficient documentation

## 2022-03-11 NOTE — Therapy (Signed)
OUTPATIENT PHYSICAL THERAPY THORACOLUMBAR EVALUATION   Patient Name: Laurie Mckinney MRN: [id number] DOB:11-16-1960, 61 y.o., female Today's Date: 03/11/2022     Past Medical History:  Diagnosis Date   Adjustment insomnia    SHIFT WORK   Allergy    RHINITIS   Asthma    Atrial flutter ([place]) 02/26/15   [facility]   Back pain    Bursitis    right hip   Cataract    see last eye eexam note    Constipation    Depression    Diabetes mellitus without complication (HCC)    History of atrial fibrillation    History of cancer    Hypertension    Incontinence    Joint pain    Migraine headache    Mobitz type 2 second degree heart block    Neuropathy    Obesity    Other fatigue    Shortness of breath on exertion    Sleep apnea    Past Surgical History:  Procedure Laterality Date   ABDOMINAL HYSTERECTOMY     ATRIAL FIBRILLATION ABLATION N/A 10/27/2019   Procedure: ATRIAL FIBRILLATION ABLATION;  Surgeon: Constance Haw, MD;  Location: [facility];  Service: Cardiovascular;  Laterality: N/A;   CARDIOVERSION N/A 05/25/2019   Procedure: CARDIOVERSION;  Surgeon: Arnoldo Lenis, MD;  Location: [facility];  Service: Endoscopy;  Laterality: N/A;   COLONOSCOPY  2011   Dr.Brodie   ROBOTIC ASSISTED TOTAL HYSTERECTOMY WITH BILATERAL SALPINGO OOPHERECTOMY Bilateral 02/05/2015   Procedure: ROBOTIC ASSISTED TOTAL LAPAROSCOPIC HYSTERECTOMY WITH BILATERAL SALPINGO OOPHORECTOMY;  Surgeon: Everitt Amber, MD;  Location: [facility];  Service: Gynecology;  Laterality: Bilateral;   Patient Active Problem List   Diagnosis Date Noted   Peripheral neuropathy 12/18/2021   Symptomatic mammary hypertrophy 12/02/2021   Recurrent UTI 09/02/2021   Lacerations of multiple sites of left arm 08/11/2021   Bilateral pendulous breasts 08/11/2021   Anxiety and depression 02/12/2021   Class 2 severe obesity with serious comorbidity and body mass index (BMI) of 37.0 to 37.9 in adult [facility])  12/12/2020   Hyperlipidemia associated with type 2 diabetes mellitus ([place]) 11/14/2020   History of hysterectomy for cancer 08/19/2020   Secondary hypercoagulable state ([place]) 11/22/2019   Female stress incontinence 10/04/2019   Diabetic neuropathy ([place]) 09/14/2019   OSA (obstructive sleep apnea) 07/13/2019   Varicose veins of lower extremity 04/14/2019   Atrial fibrillation ([place]) 04/14/2019   Hearing loss 04/13/2019   Diabetes mellitus ([place]) 09/29/2018   Second degree heart block 05/05/2018   Chronic low back pain without sciatica 05/05/2018   Hypertension associated with diabetes ([place]) 09/20/2017   Actinic keratosis 02/24/2017   Dysthymia 01/10/2016   History of endometrial cancer 02/05/2015    Class: Stage 1   History of migraine headaches 03/18/2011   Allergic rhinitis due to pollen 03/18/2011   Obesity (BMI 30-39.9) 03/18/2011    PCP: Jill Alexanders  REFERRING PROVIDER: Wallace Going, DO   REFERRING DIAG:   M54.50,G89.29 (ICD-10-CM) - Chronic bilateral low back pain without sciatica   Rationale for Evaluation and Treatment Rehabilitation  THERAPY DIAG:  No diagnosis found.  ONSET DATE: 2019  SUBJECTIVE:  SUBJECTIVE STATEMENT:   Pt stated she is feeling good today, has been trying to be more aware of posture.   PERTINENT HISTORY:  Low back pain B/L, pain in upper thoracic region.  Occasionally hands swell. Bursitis of right hip  PAIN:  Are you having pain? No  NPRS scale: 0/10 Pain location: low back  Pain description: dull pain in back  Aggravating factors: standing at workstation and bent over, ie washing dishes Relieving factors: shifting weight on legs, propping leg up on stool, sit down    PRECAUTIONS: None    PATIENT GOALS  to improve back pain, feel good and be  healthy   OBJECTIVE:   DIAGNOSTIC FINDINGS:  FINDINGS: Five non-rib-bearing lumbar vertebrae with anatomic posterior alignment. Straightening of the usual lumbar lordosis. No fractures. Slight thoracolumbar dextroscoliosis. Ankylosis of the facet joints from T12-L1 through L3-4. Mild disc space narrowing at L3-4 and L4-5. Sacroiliac joints anatomically aligned with mild degenerative changes, but without evidence of ankylosis.   IMPRESSION: 1. No acute osseous abnormality. 2. Ankylosis of the facet joints from T12-L1 through L3-4. While nonspecific, this can be a manifestation of ankylosing spondylitis. 3. Mild degenerative disc disease at L3-4 and L4-5.  PATIENT SURVEYS:  FOTO 49    LUMBAR ROM:   Active  A/PROM  eval  Flexion WFL  Extension 60% limited  Right lateral flexion To mid thigh  Left lateral flexion To top of knee  Right rotation Limited in comparison to left rotation 20% limited *  Left rotation WFL*   (Blank rows = not tested)  * pain   LOWER EXTREMITY MMT:    MMT Right eval Left eval  Hip flexion 5 5  Hip extension 3+ 3+  Hip abduction 4 4  Hip adduction 5 5  Hip internal rotation    Hip external rotation    Knee flexion 5 5  Knee extension 5 5  Ankle dorsiflexion 5 5  Ankle plantarflexion    Ankle inversion    Ankle eversion     (Blank rows = not tested) FUNCTIONAL TESTS:  2 minute walk test: 52'    TODAY'S TREATMENT  03/11/22: Standing: RTB Shoulder extension 10x RTB row 10x  RTB paloff 2x 10 Corner stretch 2x 30" Cervical retraction front of wall   Supine: ab set 10x5"    Posterior pelvic tilt 10x    Bridge 10x    SKTC 2x 30"  5/31 Sitting:  Tall posture x 5 Scapular retraction x 5 Abdominal set x 10 Thoracic excursion x 3 Supine: Bridge x 10 Knee to chest x 3 for 30"    PATIENT EDUCATION:  PATIENT EDUCATION: HEP [id number]-                         Education method: Explanation, Demonstration, and Handouts Education  comprehension: verbalized understanding, returned demonstration, verbal cues required, and tactile cues required    HOME EXERCISE PROGRAM: Tall posture x 5 Scapular retraction x 5 Abdominal set x 10 Thoracic excursion x 3 Supine: Bridge x 10  ASSESSMENT:  CLINICAL IMPRESSION: Therapist  reviewed goals with patient.  Began education on body mechanics for bed mobility.  Pt will need education on body mechanics for lifting and                                      Reaching as well.  Initiated initital  HEP for posture.  Session focus with core and postural strengthening and mobility.  Added standing posture strengthening exercises.  Pt with difficulty with posterior pelvic tilt and chin tucks, verbal, tactile and demonstration complete.  No reports of pain through session OBJECTIVE IMPAIRMENTS decreased endurance, decreased ROM, postural dysfunction, and pain.   ACTIVITY LIMITATIONS carrying, lifting, bending, and standing  PARTICIPATION LIMITATIONS: cleaning, laundry, shopping, community activity, and yard work  [place] and 3+ comorbidities: Back pain, BMI over 30, Congestive Heart Failure or Heart Disease, Depression, Diabetes Type I or II, High Blood Pressure, Incontinence, Sleep dysfunction  are also affecting patient's functional outcome.   REHAB POTENTIAL: Good  CLINICAL DECISION MAKING: Stable/uncomplicated  EVALUATION COMPLEXITY: Moderate   GOALS: Goals reviewed with patient? Yes  SHORT TERM GOALS: Target date: 03/25/2022  Patient will be independent with HEP in order to improve functional outcomes. Baseline:  Goal status: Ongoing  2.  Patient will report at least 25% improvement in symptoms for improved quality of life. Baseline:  Goal status: Ongoing   LONG TERM GOALS: Target date: 04/08/2022  Patient will report at least 75% improvement in symptoms for improved quality of life. Baseline:  Goal status:Ongoing  2.  Patient will improve FOTO score  by at least 10 points in order to indicate improved tolerance to activity. Baseline: 49 Goal status: Ongoing  3.  Patient will improve b/l glute strength to greater than or equal to 4/5 to reduce back strain and improve hip extension for better posture Baseline: 3+/5 Goal status: Ongoing     PLAN:   PT FREQUENCY: 2x/week  PT DURATION: 4 weeks  PLANNED INTERVENTIONS: PLANNED INTERVENTIONS: Therapeutic exercises, Therapeutic activity, Neuromuscular re-education, Balance training, Gait training, Patient/Family education, Joint manipulation, Joint mobilization, Stair training, Orthotic/Fit training, DME instructions, Aquatic Therapy, Dry Needling, Electrical stimulation, Spinal manipulation, Spinal mobilization, Cryotherapy, Moist heat, Compression bandaging, scar mobilization, Splintting, Taping, Traction, Ultrasound, Ionotophoresis '4mg'$ /ml Dexamethasone, and Manual therapy  PLAN FOR NEXT SESSION: Continue postural theraband exercises,  chest opening, postural strengthening, PPT, core activation, lumbar mobility.  Pt will need education on body mechanics for lifting and reaching as well.   Ihor Austin, LPTA/CLT; Delana Meyer [phone]  03/11/2022, 9:15AM

## 2022-03-13 ENCOUNTER — Encounter (HOSPITAL_COMMUNITY): Payer: Self-pay

## 2022-03-13 ENCOUNTER — Other Ambulatory Visit: Payer: Self-pay | Admitting: Family Medicine

## 2022-03-13 ENCOUNTER — Ambulatory Visit (HOSPITAL_COMMUNITY): Payer: BC Managed Care – PPO

## 2022-03-13 DIAGNOSIS — Z1231 Encounter for screening mammogram for malignant neoplasm of breast: Secondary | ICD-10-CM | POA: Diagnosis not present

## 2022-03-13 DIAGNOSIS — Z130 Encounter for screening for diseases of the blood and blood-forming organs and certain disorders involving the immune mechanism: Secondary | ICD-10-CM | POA: Diagnosis not present

## 2022-03-13 DIAGNOSIS — Z01419 Encounter for gynecological examination (general) (routine) without abnormal findings: Secondary | ICD-10-CM | POA: Diagnosis not present

## 2022-03-13 DIAGNOSIS — M5459 Other low back pain: Secondary | ICD-10-CM | POA: Diagnosis not present

## 2022-03-13 DIAGNOSIS — R293 Abnormal posture: Secondary | ICD-10-CM | POA: Diagnosis not present

## 2022-03-13 DIAGNOSIS — Z0142 Encounter for cervical smear to confirm findings of recent normal smear following initial abnormal smear: Secondary | ICD-10-CM | POA: Diagnosis not present

## 2022-03-13 DIAGNOSIS — Z1151 Encounter for screening for human papillomavirus (HPV): Secondary | ICD-10-CM | POA: Diagnosis not present

## 2022-03-13 DIAGNOSIS — Z1389 Encounter for screening for other disorder: Secondary | ICD-10-CM | POA: Diagnosis not present

## 2022-03-13 DIAGNOSIS — E669 Obesity, unspecified: Secondary | ICD-10-CM | POA: Diagnosis not present

## 2022-03-13 DIAGNOSIS — Z124 Encounter for screening for malignant neoplasm of cervix: Secondary | ICD-10-CM | POA: Diagnosis not present

## 2022-03-13 LAB — HM MAMMOGRAPHY

## 2022-03-13 NOTE — Therapy (Signed)
OUTPATIENT PHYSICAL THERAPY THORACOLUMBAR EVALUATION   Patient Name: Laurie Mckinney MRN: [id number] DOB:Apr 18, 1961, 61 y.o., female Today's Date: 03/13/2022   PT End of Session - 03/13/22 0732     Visit Number 4    Number of Visits 8    Date for PT Re-Evaluation 03/25/22    Authorization Type [employer] COMM PPO (no copay, no auth)    Progress Note Due on Visit 8    PT Start Time 0732    PT Stop Time 0815    PT Time Calculation (min) 43 min    Activity Tolerance Patient tolerated treatment well    Behavior During Therapy WFL for tasks assessed/performed              Past Medical History:  Diagnosis Date   Adjustment insomnia    SHIFT WORK   Allergy    RHINITIS   Asthma    Atrial flutter ([place]) 02/26/15   [facility]   Back pain    Bursitis    right hip   Cataract    see last eye eexam note    Constipation    Depression    Diabetes mellitus without complication ([place])    History of atrial fibrillation    History of cancer    Hypertension    Incontinence    Joint pain    Migraine headache    Mobitz type 2 second degree heart block    Neuropathy    Obesity    Other fatigue    Shortness of breath on exertion    Sleep apnea    Past Surgical History:  Procedure Laterality Date   ABDOMINAL HYSTERECTOMY     ATRIAL FIBRILLATION ABLATION N/A 10/27/2019   Procedure: ATRIAL FIBRILLATION ABLATION;  Surgeon: Constance Haw, MD;  Location: [facility];  Service: Cardiovascular;  Laterality: N/A;   CARDIOVERSION N/A 05/25/2019   Procedure: CARDIOVERSION;  Surgeon: Arnoldo Lenis, MD;  Location: [facility];  Service: Endoscopy;  Laterality: N/A;   COLONOSCOPY  2011   Dr.Brodie   ROBOTIC ASSISTED TOTAL HYSTERECTOMY WITH BILATERAL SALPINGO OOPHERECTOMY Bilateral 02/05/2015   Procedure: ROBOTIC ASSISTED TOTAL LAPAROSCOPIC HYSTERECTOMY WITH BILATERAL SALPINGO OOPHORECTOMY;  Surgeon: Everitt Amber, MD;  Location: [facility];  Service: Gynecology;  Laterality:  Bilateral;   Patient Active Problem List   Diagnosis Date Noted   Peripheral neuropathy 12/18/2021   Symptomatic mammary hypertrophy 12/02/2021   Recurrent UTI 09/02/2021   Lacerations of multiple sites of left arm 08/11/2021   Bilateral pendulous breasts 08/11/2021   Anxiety and depression 02/12/2021   Class 2 severe obesity with serious comorbidity and body mass index (BMI) of 37.0 to 37.9 in adult [facility]) 12/12/2020   Hyperlipidemia associated with type 2 diabetes mellitus ([place]) 11/14/2020   History of hysterectomy for cancer 08/19/2020   Secondary hypercoagulable state ([place]) 11/22/2019   Female stress incontinence 10/04/2019   Diabetic neuropathy ([place]) 09/14/2019   OSA (obstructive sleep apnea) 07/13/2019   Varicose veins of lower extremity 04/14/2019   Atrial fibrillation ([place]) 04/14/2019   Hearing loss 04/13/2019   Diabetes mellitus ([place]) 09/29/2018   Second degree heart block 05/05/2018   Chronic low back pain without sciatica 05/05/2018   Hypertension associated with diabetes ([place]) 09/20/2017   Actinic keratosis 02/24/2017   Dysthymia 01/10/2016   History of endometrial cancer 02/05/2015    Class: Stage 1   History of migraine headaches 03/18/2011   Allergic rhinitis due to pollen 03/18/2011   Obesity (BMI 30-39.9)  03/18/2011    PCP: Jill Alexanders  REFERRING PROVIDER: Wallace Going, DO   REFERRING DIAG:   [phone] (ICD-10-CM) - Chronic bilateral low back pain without sciatica   Rationale for Evaluation and Treatment Rehabilitation  THERAPY DIAG:  Other low back pain  Abnormal posture  ONSET DATE: 2019  SUBJECTIVE:                                                                                                                                                                                           SUBJECTIVE STATEMENT:   pt states that after last session she experienced muscle soreness, pt now feels ok. Low back pain 2/10 current. HEP is being  performed feeling some improvement.  PERTINENT HISTORY:  Low back pain B/L, pain in upper thoracic region.  Occasionally hands swell. Bursitis of right hip  PAIN:  Are you having pain? No  NPRS scale: 2/10 Pain location: low back  Pain description: dull pain in back  Aggravating factors: standing at workstation and bent over, ie washing dishes Relieving factors: shifting weight on legs, propping leg up on stool, sit down    PRECAUTIONS: None    PATIENT GOALS  to improve back pain, feel good and be healthy   OBJECTIVE:   DIAGNOSTIC FINDINGS:  FINDINGS: Five non-rib-bearing lumbar vertebrae with anatomic posterior alignment. Straightening of the usual lumbar lordosis. No fractures. Slight thoracolumbar dextroscoliosis. Ankylosis of the facet joints from T12-L1 through L3-4. Mild disc space narrowing at L3-4 and L4-5. Sacroiliac joints anatomically aligned with mild degenerative changes, but without evidence of ankylosis.   IMPRESSION: 1. No acute osseous abnormality. 2. Ankylosis of the facet joints from T12-L1 through L3-4. While nonspecific, this can be a manifestation of ankylosing spondylitis. 3. Mild degenerative disc disease at L3-4 and L4-5.  PATIENT SURVEYS:  FOTO 49    LUMBAR ROM:   Active  A/PROM  eval  Flexion WFL  Extension 60% limited  Right lateral flexion To mid thigh  Left lateral flexion To top of knee  Right rotation Limited in comparison to left rotation 20% limited *  Left rotation WFL*   (Blank rows = not tested)  * pain   LOWER EXTREMITY MMT:    MMT Right eval Left eval  Hip flexion 5 5  Hip extension 3+ 3+  Hip abduction 4 4  Hip adduction 5 5  Hip internal rotation    Hip external rotation    Knee flexion 5 5  Knee extension 5 5  Ankle dorsiflexion 5 5  Ankle plantarflexion    Ankle inversion    Ankle eversion     (Blank  rows = not tested) FUNCTIONAL TESTS:  2 minute walk test: 508'    TODAY'S TREATMENT     03/13/22  Physioball dktc x15  Physioball LTR x10  Cat/camel x10  Seated physioball forward roles x15   Seated physioball lateral roles x10  Hamstring stretch 10 sec hold x3 b/l  Blue theraband shoulder extensions2x10  Blue theraband rows 2x10  Lat pull down 30# 2x10  Palloff walkouts lateral  1 plate x5 reps each  Stair stretch 5 sec x5 b/l   03/11/22: Standing: RTB Shoulder extension 10x RTB row 10x  RTB paloff 2x 10 Corner stretch 2x 30" Cervical retraction front of wall   Supine: ab set 10x5"    Posterior pelvic tilt 10x    Bridge 10x    SKTC 2x 30"  5/31 Sitting:  Tall posture x 5 Scapular retraction x 5 Abdominal set x 10 Thoracic excursion x 3 Supine: Bridge x 10 Knee to chest x 3 for 30"    PATIENT EDUCATION:  PATIENT EDUCATION: HEP [id number]-                         Education method: Explanation, Demonstration, and Handouts Education comprehension: verbalized understanding, returned demonstration, verbal cues required, and tactile cues required    HOME EXERCISE PROGRAM: Access Code: [email or web address] URL: [email or web address] Date: 03/13/2022 Prepared by: Leota Jacobsen  Exercises - Supine Lower Trunk Rotation with Swiss Ball  - 2 x daily - 7 x weekly - 2 sets - 10 reps - 3 hold - Supine Hip and Knee Flexion AROM with Swiss Ball  - 2 x daily - 7 x weekly - 2 sets - 10 reps - 5 hold - Kneeling Thoracic Extension Stretch with Swiss Ball  - 2 x daily - 7 x weekly - 2 sets - 10 reps - 5 hold  Tall posture x 5 Scapular retraction x 5 Abdominal set x 10 Thoracic excursion x 3 Supine: Bridge x 10  ASSESSMENT:  CLINICAL IMPRESSION: Patient feels good stretch with seated physioball forward rolls. Patient tolerates session well which focuses on stretching and stabilization/posture. Pt will need education on body mechanics for lifting and Reaching as well. Updated HEP for stretching. Patient will continue to benefit from skilled PT services to  improve strength and decrease pain.   OBJECTIVE IMPAIRMENTS decreased endurance, decreased ROM, postural dysfunction, and pain.   ACTIVITY LIMITATIONS carrying, lifting, bending, and standing  PARTICIPATION LIMITATIONS: cleaning, laundry, shopping, community activity, and yard work  [place] and 3+ comorbidities: Back pain, BMI over 30, Congestive Heart Failure or Heart Disease, Depression, Diabetes Type I or II, High Blood Pressure, Incontinence, Sleep dysfunction  are also affecting patient's functional outcome.   REHAB POTENTIAL: Good  CLINICAL DECISION MAKING: Stable/uncomplicated  EVALUATION COMPLEXITY: Moderate   GOALS: Goals reviewed with patient? Yes  SHORT TERM GOALS: Target date: 03/27/2022  Patient will be independent with HEP in order to improve functional outcomes. Baseline:  Goal status: Ongoing  2.  Patient will report at least 25% improvement in symptoms for improved quality of life. Baseline:  Goal status: Ongoing   LONG TERM GOALS: Target date: 04/10/2022  Patient will report at least 75% improvement in symptoms for improved quality of life. Baseline:  Goal status:Ongoing  2.  Patient will improve FOTO score by at least 10 points in order to indicate improved tolerance to activity. Baseline: 49 Goal status: Ongoing  3.  Patient will improve b/l glute strength to greater than  or equal to 4/5 to reduce back strain and improve hip extension for better posture Baseline: 3+/5 Goal status: Ongoing     PLAN:   PT FREQUENCY: 2x/week  PT DURATION: 4 weeks  PLANNED INTERVENTIONS: PLANNED INTERVENTIONS: Therapeutic exercises, Therapeutic activity, Neuromuscular re-education, Balance training, Gait training, Patient/Family education, Joint manipulation, Joint mobilization, Stair training, Orthotic/Fit training, DME instructions, Aquatic Therapy, Dry Needling, Electrical stimulation, Spinal manipulation, Spinal mobilization, Cryotherapy, Moist  heat, Compression bandaging, scar mobilization, Splintting, Taping, Traction, Ultrasound, Ionotophoresis '4mg'$ /ml Dexamethasone, and Manual therapy  PLAN FOR NEXT SESSION: Continue postural theraband exercises,  chest opening, postural strengthening, PPT, core activation, lumbar mobility.  Pt will need education on body mechanics for lifting and reaching as well.   Floretta Petro PT, DPT   03/13/2022, 9:15AM

## 2022-03-15 NOTE — Progress Notes (Unsigned)
Chief Complaint:   OBESITY Laurie Mckinney is here to discuss her progress with her obthe Category 3 [place] treatment plan along with follow-up of her obesity related diagnoses. Laurie Mckinney is on the Category 3 Plan and states she is following her eating plan approximately 40% of the time. Laurie Mckinney states she is walking 20-30 minutes 7 times per week.  Today's visit was #: 22 Starting weight: 268 lbs Starting date: 11/14/2020 Today's weight: 213 lbs Today's date: 03/10/2022 Total lbs lost to date: 55 lbs Total lbs lost since last in-office visit: 0  Interim History: Laurie Mckinney had her annual physical and labs with her PCP, Dr Redmond School yesterday.  PCP did not change in medications. Of Note:  Her goal is to hike the [employer]  Subjective:   1. Hypertension associated with type 2 diabetes mellitus ([place]) Blood pressure soft today.  PCP manages lisinopril/HCTZ 20-25 mg daily. No symptoms of hypotension.   2. Bilateral pendulous breasts Laurie Mckinney will begin physical therapy as  order by Dr Marla Roe: 4 weeks, 2 times weekly.  On 11/12/2021 her mammogram was completed.    3. Type 2 diabetes mellitus with other specified complication, without long-term current use of insulin (HCC) Laurie Mckinney is on Ozempic 1 mg ***. Fasting blood glucose has been 115-120.  03/09/2022 A1c 5.5 at goal.   Assessment/Plan:   1. Hypertension associated with type 2 diabetes mellitus ([place]) Monitor home blood pressures, contact PCP with systolic blood pressure if <90 or symptoms of hypotension.   2. Bilateral pendulous breasts Complete physical therapy and follow up with plastics.   3. Type 2 diabetes mellitus with other specified complication, without long-term current use of insulin (HCC) Refill Ozempic 1 mg once weekly, dispense 9 mL no refills, see below.   - Semaglutide, 1 MG/DOSE, 4 MG/3ML SOPN; Inject 1 mg as directed once a week.  Dispense: 9 mL; Refill: 0  4. Obesity with current BMI  29.8 Laurie Mckinney is currently in the action stage of change. As such, her goal is to continue with weight loss efforts. She has agreed to the Category 3 Plan.   Exercise goals:  As is.   Behavioral modification strategies: increasing lean protein intake, decreasing simple carbohydrates, keeping healthy foods in the home, ways to avoid boredom eating, and planning for success.  Laurie Mckinney has agreed to follow-up with our clinic in 5-6 weeks. She was informed of the importance of frequent follow-up visits to maximize her success with intensive lifestyle modifications for her multiple health conditions.   Objective:   Blood pressure 101/67, pulse 73, temperature 97.8 F (36.6 C), height '5\' 11"'$  (1.803 m), weight 213 lb (96.6 kg), last menstrual period 01/26/2011, SpO2 98 %. Body mass index is 29.71 kg/m.  General: Cooperative, alert, well developed, in no acute distress. HEENT: Conjunctivae and lids unremarkable. Cardiovascular: Regular rhythm.  Lungs: Normal work of breathing. Neurologic: No focal deficits.   Lab Results  Component Value Date   CREATININE 0.79 03/09/2022   BUN 13 03/09/2022   NA 141 03/09/2022   K 4.5 03/09/2022   CL 102 03/09/2022   CO2 25 03/09/2022   Lab Results  Component Value Date   ALT 18 03/09/2022   AST 22 03/09/2022   ALKPHOS 77 03/09/2022   BILITOT 0.5 03/09/2022   Lab Results  Component Value Date   HGBA1C 5.5 03/09/2022   HGBA1C 5.8 (A) 12/18/2021   HGBA1C 5.8 (A) 06/20/2021   HGBA1C 6.1 (H) 05/13/2021   HGBA1C 6.4 (A) 02/10/2021  Lab Results  Component Value Date   INSULIN 9.7 05/13/2021   INSULIN 4.0 11/14/2020   Lab Results  Component Value Date   TSH 0.724 11/14/2020   Lab Results  Component Value Date   CHOL 108 03/09/2022   HDL 62 03/09/2022   LDLCALC 37 03/09/2022   TRIG 27 03/09/2022   CHOLHDL 1.7 03/09/2022   Lab Results  Component Value Date   VD25OH 40.5 11/14/2020   VD25OH 35 08/14/2011   Lab Results  Component  Value Date   WBC 8.0 03/09/2022   HGB 13.6 03/09/2022   HCT 40.1 03/09/2022   MCV 91 03/09/2022   PLT 271 03/09/2022   No results found for: "IRON", "TIBC", "FERRITIN"   Attestation Statements:   Reviewed by clinician on day of visit: allergies, medications, problem list, medical history, surgical history, family history, social history, and previous encounter notes.  I, Davy Pique, RMA, am acting as Location manager for Mina Marble, NP.  I have reviewed the above documentation for accuracy and completeness, and I agree with the above. -  ***

## 2022-03-16 DIAGNOSIS — G4733 Obstructive sleep apnea (adult) (pediatric): Secondary | ICD-10-CM | POA: Diagnosis not present

## 2022-03-17 ENCOUNTER — Ambulatory Visit (HOSPITAL_COMMUNITY): Payer: BC Managed Care – PPO

## 2022-03-17 ENCOUNTER — Encounter (HOSPITAL_COMMUNITY): Payer: Self-pay

## 2022-03-17 DIAGNOSIS — R293 Abnormal posture: Secondary | ICD-10-CM

## 2022-03-17 DIAGNOSIS — M5459 Other low back pain: Secondary | ICD-10-CM

## 2022-03-17 NOTE — Therapy (Signed)
OUTPATIENT PHYSICAL THERAPY THORACOLUMBAR EVALUATION   Patient Name: Laurie Mckinney MRN: [id number] DOB:11-27-1960, 61 y.o., female Today's Date: 03/17/2022   PT End of Session - 03/17/22 0818     Visit Number 5    Number of Visits 8    Date for PT Re-Evaluation 03/25/22    Authorization Type BCBS COMM PPO (no copay, no auth)    Progress Note Due on Visit 8    PT Start Time 0820    PT Stop Time 0900    PT Time Calculation (min) 40 min    Activity Tolerance Patient tolerated treatment well    Behavior During Therapy WFL for tasks assessed/performed              Past Medical History:  Diagnosis Date   Adjustment insomnia    SHIFT WORK   Allergy    RHINITIS   Asthma    Atrial flutter ([place]) 02/26/15   [facility]   Back pain    Bursitis    right hip   Cataract    see last eye eexam note    Constipation    Depression    Diabetes mellitus without complication ([place])    History of atrial fibrillation    History of cancer    Hypertension    Incontinence    Joint pain    Migraine headache    Mobitz type 2 second degree heart block    Neuropathy    Obesity    Other fatigue    Shortness of breath on exertion    Sleep apnea    Past Surgical History:  Procedure Laterality Date   ABDOMINAL HYSTERECTOMY     ATRIAL FIBRILLATION ABLATION N/A 10/27/2019   Procedure: ATRIAL FIBRILLATION ABLATION;  Surgeon: Constance Haw, MD;  Location: [facility];  Service: Cardiovascular;  Laterality: N/A;   CARDIOVERSION N/A 05/25/2019   Procedure: CARDIOVERSION;  Surgeon: Arnoldo Lenis, MD;  Location: [facility];  Service: Endoscopy;  Laterality: N/A;   COLONOSCOPY  2011   Dr.Brodie   ROBOTIC ASSISTED TOTAL HYSTERECTOMY WITH BILATERAL SALPINGO OOPHERECTOMY Bilateral 02/05/2015   Procedure: ROBOTIC ASSISTED TOTAL LAPAROSCOPIC HYSTERECTOMY WITH BILATERAL SALPINGO OOPHORECTOMY;  Surgeon: Everitt Amber, MD;  Location: [facility];  Service: Gynecology;   Laterality: Bilateral;   Patient Active Problem List   Diagnosis Date Noted   Peripheral neuropathy 12/18/2021   Symptomatic mammary hypertrophy 12/02/2021   Recurrent UTI 09/02/2021   Lacerations of multiple sites of left arm 08/11/2021   Bilateral pendulous breasts 08/11/2021   Anxiety and depression 02/12/2021   Class 2 severe obesity with serious comorbidity and body mass index (BMI) of 37.0 to 37.9 in adult [facility]) 12/12/2020   Hyperlipidemia associated with type 2 diabetes mellitus ([place]) 11/14/2020   History of hysterectomy for cancer 08/19/2020   Secondary hypercoagulable state ([place]) 11/22/2019   Female stress incontinence 10/04/2019   Diabetic neuropathy ([place]) 09/14/2019   OSA (obstructive sleep apnea) 07/13/2019   Varicose veins of lower extremity 04/14/2019   Atrial fibrillation ([place]) 04/14/2019   Hearing loss 04/13/2019   Diabetes mellitus ([place]) 09/29/2018   Second degree heart block 05/05/2018   Chronic low back pain without sciatica 05/05/2018   Hypertension associated with diabetes ([place]) 09/20/2017   Actinic keratosis 02/24/2017   Dysthymia 01/10/2016   History of endometrial cancer 02/05/2015    Class: Stage 1   History of migraine headaches 03/18/2011   Allergic rhinitis due to pollen 03/18/2011   Obesity (BMI 30-39.9)  03/18/2011    PCP: Jill Alexanders  REFERRING PROVIDER: Wallace Going, DO   REFERRING DIAG:   [phone] (ICD-10-CM) - Chronic bilateral low back pain without sciatica   Rationale for Evaluation and Treatment Rehabilitation  THERAPY DIAG:  Other low back pain  Abnormal posture  ONSET DATE: 2019  SUBJECTIVE:                                                                                                                                                                                           SUBJECTIVE STATEMENT:   pt states that she has been using an exercise at band as well as doing UE/LE exercises.     PERTINENT HISTORY:   Low back pain B/L, pain in upper thoracic region.  Occasionally hands swell. Bursitis of right hip  PAIN:  Are you having pain? No  NPRS scale: 2/10 Pain location: low back and upper back around T1  Pain description: dull pain in back  Aggravating factors: standing at workstation and bent over, ie washing dishes Relieving factors: shifting weight on legs, propping leg up on stool, sit down    PRECAUTIONS: None    PATIENT GOALS  to improve back pain, feel good and be healthy   OBJECTIVE:   DIAGNOSTIC FINDINGS:  FINDINGS: Five non-rib-bearing lumbar vertebrae with anatomic posterior alignment. Straightening of the usual lumbar lordosis. No fractures. Slight thoracolumbar dextroscoliosis. Ankylosis of the facet joints from T12-L1 through L3-4. Mild disc space narrowing at L3-4 and L4-5. Sacroiliac joints anatomically aligned with mild degenerative changes, but without evidence of ankylosis.   IMPRESSION: 1. No acute osseous abnormality. 2. Ankylosis of the facet joints from T12-L1 through L3-4. While nonspecific, this can be a manifestation of ankylosing spondylitis. 3. Mild degenerative disc disease at L3-4 and L4-5.  PATIENT SURVEYS:  FOTO 49    LUMBAR ROM:   Active  A/PROM  eval  Flexion WFL  Extension 60% limited  Right lateral flexion To mid thigh  Left lateral flexion To top of knee  Right rotation Limited in comparison to left rotation 20% limited *  Left rotation WFL*   (Blank rows = not tested)  * pain   LOWER EXTREMITY MMT:    MMT Right eval Left eval  Hip flexion 5 5  Hip extension 3+ 3+  Hip abduction 4 4  Hip adduction 5 5  Hip internal rotation    Hip external rotation    Knee flexion 5 5  Knee extension 5 5  Ankle dorsiflexion 5 5  Ankle plantarflexion    Ankle inversion    Ankle eversion     (Blank rows =  not tested) FUNCTIONAL TESTS:  2 minute walk test: 59'    TODAY'S TREATMENT   03/17/22  Nustep level 7 twin hills x4  mins  Foam roll for low back  Half foam in supine marches x20 b/l-> shoulder extensions x15 with blue therband  Seated physioball forward rolls with 5 sec holds x15  Supine hamstring/calf stretch 10 sec holds x5 b/l   With b/l UE holding strap, flexion/extension to behind head 2x5  STM to low back and thoracic spine    03/13/22  Physioball dktc x15  Physioball LTR x10  Cat/camel x10  Seated physioball forward roles x15   Seated physioball lateral roles x10  Hamstring stretch 10 sec hold x3 b/l  Blue theraband shoulder extensions2x10  Blue theraband rows 2x10  Lat pull down 30# 2x10  Palloff walkouts lateral  1 plate x5 reps each  Stair stretch 5 sec x5 b/l   03/11/22: Standing: RTB Shoulder extension 10x RTB row 10x  RTB paloff 2x 10 Corner stretch 2x 30" Cervical retraction front of wall   Supine: ab set 10x5"    Posterior pelvic tilt 10x    Bridge 10x    SKTC 2x 30"  5/31 Sitting:  Tall posture x 5 Scapular retraction x 5 Abdominal set x 10 Thoracic excursion x 3 Supine: Bridge x 10 Knee to chest x 3 for 30"    PATIENT EDUCATION:  PATIENT EDUCATION: HEP [id number]-                         Education method: Explanation, Demonstration, and Handouts Education comprehension: verbalized understanding, returned demonstration, verbal cues required, and tactile cues required    HOME EXERCISE PROGRAM: Access Code: [email or web address] URL: [email or web address] Date: 03/13/2022 Prepared by: Leota Jacobsen  Exercises - Supine Lower Trunk Rotation with Swiss Ball  - 2 x daily - 7 x weekly - 2 sets - 10 reps - 3 hold - Supine Hip and Knee Flexion AROM with Swiss Ball  - 2 x daily - 7 x weekly - 2 sets - 10 reps - 5 hold - Kneeling Thoracic Extension Stretch with Swiss Ball  - 2 x daily - 7 x weekly - 2 sets - 10 reps - 5 hold  Tall posture x 5 Scapular retraction x 5 Abdominal set x 10 Thoracic excursion x 3 Supine: Bridge x 10  ASSESSMENT:  CLINICAL  IMPRESSION: Patient feels good stretch with seated physioball forward rolls. Patient tolerates session well which focuses on stretching and stabilization/posture. Added in foam roll and half roll to todays exercises for mobility and stabilization work. Patient with increased restriction with UE/scab mobility on right side. On STM pt with inflammation about right middle trap area.  Pt will need education on body mechanics for lifting and Reaching as well. Updated HEP for stretching. Patient will continue to benefit from skilled PT services to improve strength and decrease pain.   OBJECTIVE IMPAIRMENTS decreased endurance, decreased ROM, postural dysfunction, and pain.   ACTIVITY LIMITATIONS carrying, lifting, bending, and standing  PARTICIPATION LIMITATIONS: cleaning, laundry, shopping, community activity, and yard work  [place] and 3+ comorbidities: Back pain, BMI over 30, Congestive Heart Failure or Heart Disease, Depression, Diabetes Type I or II, High Blood Pressure, Incontinence, Sleep dysfunction  are also affecting patient's functional outcome.   REHAB POTENTIAL: Good  CLINICAL DECISION MAKING: Stable/uncomplicated  EVALUATION COMPLEXITY: Moderate   GOALS: Goals reviewed with patient? Yes  SHORT TERM GOALS: Target date: 03/31/2022  Patient will be independent with HEP in order to improve functional outcomes. Baseline:  Goal status: Ongoing  2.  Patient will report at least 25% improvement in symptoms for improved quality of life. Baseline:  Goal status: Ongoing   LONG TERM GOALS: Target date: 04/14/2022  Patient will report at least 75% improvement in symptoms for improved quality of life. Baseline:  Goal status:Ongoing  2.  Patient will improve FOTO score by at least 10 points in order to indicate improved tolerance to activity. Baseline: 49 Goal status: Ongoing  3.  Patient will improve b/l glute strength to greater than or equal to 4/5 to reduce back  strain and improve hip extension for better posture Baseline: 3+/5 Goal status: Ongoing     PLAN:   PT FREQUENCY: 2x/week  PT DURATION: 4 weeks  PLANNED INTERVENTIONS: PLANNED INTERVENTIONS: Therapeutic exercises, Therapeutic activity, Neuromuscular re-education, Balance training, Gait training, Patient/Family education, Joint manipulation, Joint mobilization, Stair training, Orthotic/Fit training, DME instructions, Aquatic Therapy, Dry Needling, Electrical stimulation, Spinal manipulation, Spinal mobilization, Cryotherapy, Moist heat, Compression bandaging, scar mobilization, Splintting, Taping, Traction, Ultrasound, Ionotophoresis '4mg'$ /ml Dexamethasone, and Manual therapy  PLAN FOR NEXT SESSION: Continue postural theraband exercises,  chest opening, postural strengthening, PPT, core activation, lumbar mobility.  Pt will need education on body mechanics for lifting and reaching as well.   Danaly Bari PT, DPT   03/17/2022, 9:15AM

## 2022-03-19 ENCOUNTER — Encounter (HOSPITAL_COMMUNITY): Payer: BC Managed Care – PPO

## 2022-03-24 ENCOUNTER — Encounter (HOSPITAL_COMMUNITY): Payer: Self-pay

## 2022-03-24 ENCOUNTER — Ambulatory Visit (HOSPITAL_COMMUNITY): Payer: BC Managed Care – PPO | Attending: Family Medicine

## 2022-03-24 DIAGNOSIS — R293 Abnormal posture: Secondary | ICD-10-CM | POA: Diagnosis not present

## 2022-03-24 DIAGNOSIS — M5459 Other low back pain: Secondary | ICD-10-CM | POA: Insufficient documentation

## 2022-03-24 NOTE — Therapy (Signed)
OUTPATIENT PHYSICAL THERAPY THORACOLUMBAR EVALUATION   Patient Name: Laurie Mckinney MRN: [id number] DOB:[birth year], 61 y.o., female Today's Date: 03/24/2022   PT End of Session - 03/24/22 0818     Visit Number 6    Number of Visits 8    Date for PT Re-Evaluation 03/25/22    Authorization Type BCBS COMM PPO (no copay, no auth)    Progress Note Due on Visit 8    PT Start Time 0818    PT Stop Time 0900    PT Time Calculation (min) 42 min    Activity Tolerance Patient tolerated treatment well    Behavior During Therapy WFL for tasks assessed/performed              Past Medical History:  Diagnosis Date   Adjustment insomnia    SHIFT WORK   Allergy    RHINITIS   Asthma    Atrial flutter ([place]) 02/26/15   [facility]   Back pain    Bursitis    right hip   Cataract    see last eye eexam note    Constipation    Depression    Diabetes mellitus without complication ([place])    History of atrial fibrillation    History of cancer    Hypertension    Incontinence    Joint pain    Migraine headache    Mobitz type 2 second degree heart block    Neuropathy    Obesity    Other fatigue    Shortness of breath on exertion    Sleep apnea    Past Surgical History:  Procedure Laterality Date   ABDOMINAL HYSTERECTOMY     ATRIAL FIBRILLATION ABLATION N/A 10/27/2019   Procedure: ATRIAL FIBRILLATION ABLATION;  Surgeon: Constance Haw, MD;  Location: [facility];  Service: Cardiovascular;  Laterality: N/A;   CARDIOVERSION N/A 05/25/2019   Procedure: CARDIOVERSION;  Surgeon: Arnoldo Lenis, MD;  Location: [facility];  Service: Endoscopy;  Laterality: N/A;   COLONOSCOPY  2011   Dr.Brodie   ROBOTIC ASSISTED TOTAL HYSTERECTOMY WITH BILATERAL SALPINGO OOPHERECTOMY Bilateral 02/05/2015   Procedure: ROBOTIC ASSISTED TOTAL LAPAROSCOPIC HYSTERECTOMY WITH BILATERAL SALPINGO OOPHORECTOMY;  Surgeon: Everitt Amber, MD;  Location: [facility];  Service: Gynecology;   Laterality: Bilateral;   Patient Active Problem List   Diagnosis Date Noted   Peripheral neuropathy 12/18/2021   Symptomatic mammary hypertrophy 12/02/2021   Recurrent UTI 09/02/2021   Lacerations of multiple sites of left arm 08/11/2021   Bilateral pendulous breasts 08/11/2021   Anxiety and depression 02/12/2021   Class 2 severe obesity with serious comorbidity and body mass index (BMI) of 37.0 to 37.9 in adult [facility]) 12/12/2020   Hyperlipidemia associated with type 2 diabetes mellitus ([place]) 11/14/2020   History of hysterectomy for cancer 08/19/2020   Secondary hypercoagulable state ([place]) 11/22/2019   Female stress incontinence 10/04/2019   Diabetic neuropathy ([place]) 09/14/2019   OSA (obstructive sleep apnea) 07/13/2019   Varicose veins of lower extremity 04/14/2019   Atrial fibrillation ([place]) 04/14/2019   Hearing loss 04/13/2019   Diabetes mellitus ([place]) 09/29/2018   Second degree heart block 05/05/2018   Chronic low back pain without sciatica 05/05/2018   Hypertension associated with diabetes ([place]) 09/20/2017   Actinic keratosis 02/24/2017   Dysthymia 01/10/2016   History of endometrial cancer 02/05/2015    Class: Stage 1   History of migraine headaches 03/18/2011   Allergic rhinitis due to pollen 03/18/2011   Obesity (BMI 30-39.9)  03/18/2011    PCP: Jill Alexanders  REFERRING PROVIDER: Wallace Going, DO   REFERRING DIAG:   [phone] (ICD-10-CM) - Chronic bilateral low back pain without sciatica   Rationale for Evaluation and Treatment Rehabilitation  THERAPY DIAG:  Other low back pain  Abnormal posture  ONSET DATE: 2019  SUBJECTIVE:                                                                                                                                                                                           SUBJECTIVE STATEMENT:   patient states that mid back is not seeming to hurt as much. Low back is still having tenderness.    PERTINENT  HISTORY:  Low back pain B/L, pain in upper thoracic region.  Occasionally hands swell. Bursitis of right hip  PAIN:  Are you having pain? No  NPRS scale: 2/10 Pain location: low back and upper back around T1  Pain description: dull pain in back  Aggravating factors: standing at workstation and bent over, ie washing dishes Relieving factors: shifting weight on legs, propping leg up on stool, sit down    PRECAUTIONS: None    PATIENT GOALS  to improve back pain, feel good and be healthy   OBJECTIVE:   DIAGNOSTIC FINDINGS:  FINDINGS: Five non-rib-bearing lumbar vertebrae with anatomic posterior alignment. Straightening of the usual lumbar lordosis. No fractures. Slight thoracolumbar dextroscoliosis. Ankylosis of the facet joints from T12-L1 through L3-4. Mild disc space narrowing at L3-4 and L4-5. Sacroiliac joints anatomically aligned with mild degenerative changes, but without evidence of ankylosis.   IMPRESSION: 1. No acute osseous abnormality. 2. Ankylosis of the facet joints from T12-L1 through L3-4. While nonspecific, this can be a manifestation of ankylosing spondylitis. 3. Mild degenerative disc disease at L3-4 and L4-5.  PATIENT SURVEYS:  FOTO 63    LUMBAR ROM:   Active  A/PROM  eval AROM 03/24/22  Flexion WFL 6cm Initial starting point, bisecting line of PSIS and 15cm above   Extension 60% limited 3cm Initial starting point, bisecting line of PSIS and 15cm above   Right lateral flexion To mid thigh 59cm  Left lateral flexion To top of knee 59cm*  Right rotation Limited in comparison to left rotation 20% limited * Wfl*  Left rotation [facility]* Wfl*   (Blank rows = not tested)  * pain    LOWER EXTREMITY MMT:    MMT Right eval Left eval RIGHT  03/24/22 Left  03/24/22  Hip flexion '5 5 5 5  ' Hip extension 3+ 3+ 4 4  Hip abduction 4 4 5* pain across low back  5  Hip adduction  '5 5 5 5  ' Hip internal rotation      Hip external rotation      Knee flexion '5 5 5  5  ' Knee extension '5 5 5 5  ' Ankle dorsiflexion '5 5 5 5  ' Ankle plantarflexion      Ankle inversion      Ankle eversion       (Blank rows = not tested) FUNCTIONAL TESTS:  2 minute walk test: 508'    TODAY'S TREATMENT   03/24/22  Reassessment  MET into hip flexion/extension b/l  Manual resisted hip abd/add in hooklying  Grade 2 +3 mobilization L4-L5  STW to b/l low back    03/17/22  Nustep level 7 twin hills x4 mins  Foam roll for low back  Half foam in supine marches x20 b/l-> shoulder extensions x15 with blue therband  Seated physioball forward rolls with 5 sec holds x15  Supine hamstring/calf stretch 10 sec holds x5 b/l   With b/l UE holding strap, flexion/extension to behind head 2x5  STM to low back and thoracic spine    03/13/22  Physioball dktc x15  Physioball LTR x10  Cat/camel x10  Seated physioball forward roles x15   Seated physioball lateral roles x10  Hamstring stretch 10 sec hold x3 b/l  Blue theraband shoulder extensions2x10  Blue theraband rows 2x10  Lat pull down 30# 2x10  Palloff walkouts lateral  1 plate x5 reps each  Stair stretch 5 sec x5 b/l     PATIENT EDUCATION:  PATIENT EDUCATION: HEP [id number]-                         Education method: Explanation, Demonstration, and Handouts Education comprehension: verbalized understanding, returned demonstration, verbal cues required, and tactile cues required    HOME EXERCISE PROGRAM: Access Code: [email or web address] URL: [email or web address] Date: 03/13/2022 Prepared by: Leota Jacobsen  Exercises - Supine Lower Trunk Rotation with Swiss Ball  - 2 x daily - 7 x weekly - 2 sets - 10 reps - 3 hold - Supine Hip and Knee Flexion AROM with Swiss Ball  - 2 x daily - 7 x weekly - 2 sets - 10 reps - 5 hold - Kneeling Thoracic Extension Stretch with Swiss Ball  - 2 x daily - 7 x weekly - 2 sets - 10 reps - 5 hold  Tall posture x 5 Scapular retraction x 5 Abdominal set x 10 Thoracic excursion x  3 Supine: Bridge x 10  ASSESSMENT:  CLINICAL IMPRESSION: Patient sessions have focused on stretching, postural strengthening, and mobilization. Patient improves FOTO score from 60 to 63. Improves lumbar rotation to equal rotation wfl b/l, improves hip abduction and hip extension strength b/l.  Patient meets stg and is independent in HEP as well as feels a 35% improvement in function since starting PT. Despite improvements patient continues to have pain b/l spanning from thoracic spine into lumbar spine. PT to incorporate more extension and core strengthening and incorporate more full body range and dynamic stretches. Patient will continue to benefit from skilled PT services to improve strength and decrease pain.   OBJECTIVE IMPAIRMENTS decreased endurance, decreased ROM, postural dysfunction, and pain.   ACTIVITY LIMITATIONS carrying, lifting, bending, and standing  PARTICIPATION LIMITATIONS: cleaning, laundry, shopping, community activity, and yard work  [place] and 3+ comorbidities: Back pain, BMI over 30, Congestive Heart Failure or Heart Disease, Depression, Diabetes Type I or II, High Blood Pressure, Incontinence, Sleep dysfunction  are also  affecting patient's functional outcome.   REHAB POTENTIAL: Good  CLINICAL DECISION MAKING: Stable/uncomplicated  EVALUATION COMPLEXITY: Moderate   GOALS: Goals reviewed with patient? Yes  SHORT TERM GOALS: Target date: 04/07/2022  Patient will be independent with HEP in order to improve functional outcomes. Baseline:  Current: Independent  Goal status:Met  2.  Patient will report at least 25% improvement in symptoms for improved quality of life. Baseline:  Current: 35% Goal status: MET   LONG TERM GOALS: Target date: 04/21/2022  Patient will report at least 75% improvement in symptoms for improved quality of life. Baseline:  Current 03/24/22: 35% Goal status:Ongoing  2.  Patient will improve FOTO score by at least  10 points in order to indicate improved tolerance to activity. Baseline: 60 Current: 63 Goal status: Ongoing  3.  Patient will improve b/l glute strength to greater than or equal to 4/5 to reduce back strain and improve hip extension for better posture Baseline: 3+/5 Current: 4/5 Goal status: MET 4. Patient will be pain free in lumbar rotation and side bending and improve ability to complete home chores.  Current: 2/10  Goal status: new goal added     PLAN:   PT FREQUENCY: 2x/week  PT DURATION: 4 weeks  PLANNED INTERVENTIONS: PLANNED INTERVENTIONS: Therapeutic exercises, Therapeutic activity, Neuromuscular re-education, Balance training, Gait training, Patient/Family education, Joint manipulation, Joint mobilization, Stair training, Orthotic/Fit training, DME instructions, Aquatic Therapy, Dry Needling, Electrical stimulation, Spinal manipulation, Spinal mobilization, Cryotherapy, Moist heat, Compression bandaging, scar mobilization, Splintting, Taping, Traction, Ultrasound, Ionotophoresis 59m/ml Dexamethasone, and Manual therapy  PLAN FOR NEXT SESSION: incorporate lumbar extension strengthening, incorporate more low back mobility    Adham Johnson PT, DPT   03/24/2022, 9:15AM

## 2022-03-26 ENCOUNTER — Ambulatory Visit (HOSPITAL_COMMUNITY): Payer: BC Managed Care – PPO | Admitting: Physical Therapy

## 2022-03-26 DIAGNOSIS — R293 Abnormal posture: Secondary | ICD-10-CM

## 2022-03-26 DIAGNOSIS — M5459 Other low back pain: Secondary | ICD-10-CM

## 2022-03-26 NOTE — Therapy (Signed)
OUTPATIENT PHYSICAL THERAPY THORACOLUMBAR EVALUATION   Patient Name: Laurie Mckinney MRN: [id number] DOB:[birth year], 61 y.o., female Today's Date: 03/26/2022   PT End of Session - 03/26/22 0833     Visit Number 7    Number of Visits 14    Date for PT Re-Evaluation 04/21/22    Authorization Type [employer] COMM PPO (no copay, no auth)    Progress Note Due on Visit 14    PT Start Time [phone]    PT Stop Time 0913    PT Time Calculation (min) 38 min    Activity Tolerance Patient tolerated treatment well    Behavior During Therapy WFL for tasks assessed/performed              Past Medical History:  Diagnosis Date   Adjustment insomnia    SHIFT WORK   Allergy    RHINITIS   Asthma    Atrial flutter ([place]) 02/26/15   [facility]   Back pain    Bursitis    right hip   Cataract    see last eye eexam note    Constipation    Depression    Diabetes mellitus without complication ([place])    History of atrial fibrillation    History of cancer    Hypertension    Incontinence    Joint pain    Migraine headache    Mobitz type 2 second degree heart block    Neuropathy    Obesity    Other fatigue    Shortness of breath on exertion    Sleep apnea    Past Surgical History:  Procedure Laterality Date   ABDOMINAL HYSTERECTOMY     ATRIAL FIBRILLATION ABLATION N/A 10/27/2019   Procedure: ATRIAL FIBRILLATION ABLATION;  Surgeon: Constance Haw, MD;  Location: [facility];  Service: Cardiovascular;  Laterality: N/A;   CARDIOVERSION N/A 05/25/2019   Procedure: CARDIOVERSION;  Surgeon: Arnoldo Lenis, MD;  Location: [facility];  Service: Endoscopy;  Laterality: N/A;   COLONOSCOPY  2011   Dr.Brodie   ROBOTIC ASSISTED TOTAL HYSTERECTOMY WITH BILATERAL SALPINGO OOPHERECTOMY Bilateral 02/05/2015   Procedure: ROBOTIC ASSISTED TOTAL LAPAROSCOPIC HYSTERECTOMY WITH BILATERAL SALPINGO OOPHORECTOMY;  Surgeon: Everitt Amber, MD;  Location: [facility];  Service: Gynecology;   Laterality: Bilateral;   Patient Active Problem List   Diagnosis Date Noted   Peripheral neuropathy 12/18/2021   Symptomatic mammary hypertrophy 12/02/2021   Recurrent UTI 09/02/2021   Lacerations of multiple sites of left arm 08/11/2021   Bilateral pendulous breasts 08/11/2021   Anxiety and depression 02/12/2021   Class 2 severe obesity with serious comorbidity and body mass index (BMI) of 37.0 to 37.9 in adult [facility]) 12/12/2020   Hyperlipidemia associated with type 2 diabetes mellitus ([place]) 11/14/2020   History of hysterectomy for cancer 08/19/2020   Secondary hypercoagulable state ([place]) 11/22/2019   Female stress incontinence 10/04/2019   Diabetic neuropathy ([place]) 09/14/2019   OSA (obstructive sleep apnea) 07/13/2019   Varicose veins of lower extremity 04/14/2019   Atrial fibrillation ([place]) 04/14/2019   Hearing loss 04/13/2019   Diabetes mellitus ([place]) 09/29/2018   Second degree heart block 05/05/2018   Chronic low back pain without sciatica 05/05/2018   Hypertension associated with diabetes ([place]) 09/20/2017   Actinic keratosis 02/24/2017   Dysthymia 01/10/2016   History of endometrial cancer 02/05/2015    Class: Stage 1   History of migraine headaches 03/18/2011   Allergic rhinitis due to pollen 03/18/2011   Obesity (BMI 30-39.9)  03/18/2011    PCP: Jill Alexanders  REFERRING PROVIDER: Wallace Going, DO   REFERRING DIAG:   [phone] (ICD-10-CM) - Chronic bilateral low back pain without sciatica   Rationale for Evaluation and Treatment Rehabilitation  THERAPY DIAG:  Other low back pain  Abnormal posture  ONSET DATE: 2019  SUBJECTIVE:                                                                                                                                                                                           SUBJECTIVE STATEMENT:  Pt states overall doing better she is doing her HEP   PERTINENT HISTORY:  Low back pain B/L, pain in upper thoracic  region.  Occasionally hands swell. Bursitis of right hip  PAIN:  Are you having pain? No  NPRS scale: 2/10 Pain location: low back and upper back around T1  Pain description: dull pain in back  Aggravating factors: standing at workstation and bent over, ie washing dishes Relieving factors: shifting weight on legs, propping leg up on stool, sit down    PRECAUTIONS: None    PATIENT GOALS  to improve back pain, feel good and be healthy   OBJECTIVE:   DIAGNOSTIC FINDINGS:  FINDINGS: Five non-rib-bearing lumbar vertebrae with anatomic posterior alignment. Straightening of the usual lumbar lordosis. No fractures. Slight thoracolumbar dextroscoliosis. Ankylosis of the facet joints from T12-L1 through L3-4. Mild disc space narrowing at L3-4 and L4-5. Sacroiliac joints anatomically aligned with mild degenerative changes, but without evidence of ankylosis.   IMPRESSION: 1. No acute osseous abnormality. 2. Ankylosis of the facet joints from T12-L1 through L3-4. While nonspecific, this can be a manifestation of ankylosing spondylitis. 3. Mild degenerative disc disease at L3-4 and L4-5.  PATIENT SURVEYS:  FOTO 63    LUMBAR ROM:   Active  A/PROM  eval AROM 03/24/22  Flexion WFL 6cm Initial starting point, bisecting line of PSIS and 15cm above   Extension 60% limited 3cm Initial starting point, bisecting line of PSIS and 15cm above   Right lateral flexion To mid thigh 59cm  Left lateral flexion To top of knee 59cm*  Right rotation Limited in comparison to left rotation 20% limited * Wfl*  Left rotation WFL* Wfl*   (Blank rows = not tested)  * pain    LOWER EXTREMITY MMT:    MMT Right eval Left eval RIGHT  03/24/22 Left  03/24/22  Hip flexion '5 5 5 5  ' Hip extension 3+ 3+ 4 4  Hip abduction 4 4 5* pain across low back  5  Hip adduction '5 5 5 5  ' Hip internal rotation  Hip external rotation      Knee flexion '5 5 5 5  ' Knee extension '5 5 5 5  ' Ankle dorsiflexion '5 5  5 5  ' Ankle plantarflexion      Ankle inversion      Ankle eversion       (Blank rows = not tested) FUNCTIONAL TESTS:  2 minute walk test: 508'    TODAY'S TREATMENT             03/25/22             Standing:  hip excursion x 3                              Postural theraband ex :  scapular retraction, rows and extension x 10 red                               Functional squats for proper lifting   x 10             Seated:    physioball forward roles x15                     physioball lateral roles x 10                             Thoracic excursions x 2             Supine:    abdominal set x 10                             Bridge x 10 hold 10"                             Active hamstring stretch x 3 for 20"                              03/24/22  Reassessment  MET into hip flexion/extension b/l  Manual resisted hip abd/add in hooklying  Grade 2 +3 mobilization L4-L5  STW to b/l low back    03/17/22  Nustep level 7 [place] x4 mins  Foam roll for low back  Half foam in supine marches x20 b/l-> shoulder extensions x15 with blue therband  Seated physioball forward rolls with 5 sec holds x15  Supine hamstring/calf stretch 10 sec holds x5 b/l   With b/l UE holding strap, flexion/extension to behind head 2x5  STM to low back and thoracic spine    03/13/22  Physioball dktc x15  Physioball LTR x10  Cat/camel x10  Seated physioball forward roles x15   Seated physioball lateral roles x10  Hamstring stretch 10 sec hold x3 b/l  Blue theraband shoulder extensions2x10  Blue theraband rows 2x10  Lat pull down 30# 2x10  Palloff walkouts lateral  1 plate x5 reps each  Stair stretch 5 sec x5 b/l     PATIENT EDUCATION:  PATIENT EDUCATION: HEP [id number]                         Education method: Explanation, Demonstration, and Handouts Education comprehension: verbalized understanding, returned demonstration, verbal cues required, and tactile cues required  HOME EXERCISE  PROGRAM: 6/22:   Postural theraband ex :  scapular retraction, rows and extension x 10 red Access Code: [email or web address] URL: [email or web address] Date: 03/13/2022 Prepared by: Leota Jacobsen  Exercises - Supine Lower Trunk Rotation with Swiss Ball  - 2 x daily - 7 x weekly - 2 sets - 10 reps - 3 hold - Supine Hip and Knee Flexion AROM with Swiss Ball  - 2 x daily - 7 x weekly - 2 sets - 10 reps - 5 hold - Kneeling Thoracic Extension Stretch with Swiss Ball  - 2 x daily - 7 x weekly - 2 sets - 10 reps - 5 hold  Tall posture x 5 Scapular retraction x 5 Abdominal set x 10 Thoracic excursion x 3 Supine: Bridge x 10  ASSESSMENT:  CLINICAL IMPRESSION: Pt has poor stability of lumbar area when completing functional activity.  Treatment today worked on improving core strength focusing on lower abdominal and gluteal mm.  Pt will benefit from continued strengthening of the core mm.  OBJECTIVE IMPAIRMENTS decreased endurance, decreased ROM, postural dysfunction, and pain.   ACTIVITY LIMITATIONS carrying, lifting, bending, and standing  PARTICIPATION LIMITATIONS: cleaning, laundry, shopping, community activity, and yard work  [place] and 3+ comorbidities: Back pain, BMI over 30, Congestive Heart Failure or Heart Disease, Depression, Diabetes Type I or II, High Blood Pressure, Incontinence, Sleep dysfunction  are also affecting patient's functional outcome.   REHAB POTENTIAL: Good  CLINICAL DECISION MAKING: Stable/uncomplicated  EVALUATION COMPLEXITY: Moderate   GOALS: Goals reviewed with patient? Yes  SHORT TERM GOALS: Target date: 04/09/2022  Patient will be independent with HEP in order to improve functional outcomes. Baseline:  Current: Independent  Goal status:Met  2.  Patient will report at least 25% improvement in symptoms for improved quality of life. Baseline:  Current: 35% Goal status: MET   LONG TERM GOALS: Target date: 04/23/2022  Patient  will report at least 75% improvement in symptoms for improved quality of life. Baseline:  Current 03/24/22: 35% Goal status:Ongoing  2.  Patient will improve FOTO score by at least 10 points in order to indicate improved tolerance to activity. Baseline: 60 Current: 63 Goal status: Ongoing  3.  Patient will improve b/l glute strength to greater than or equal to 4/5 to reduce back strain and improve hip extension for better posture Baseline: 3+/5 Current: 4/5 Goal status: MET 4. Patient will be pain free in lumbar rotation and side bending and improve ability to complete home chores.  Current: 2/10  Goal status: new goal added     PLAN:   PT FREQUENCY: 2x/week  PT DURATION: 4 weeks  PLANNED INTERVENTIONS: PLANNED INTERVENTIONS: Therapeutic exercises, Therapeutic activity, Neuromuscular re-education, Balance training, Gait training, Patient/Family education, Joint manipulation, Joint mobilization, Stair training, Orthotic/Fit training, DME instructions, Aquatic Therapy, Dry Needling, Electrical stimulation, Spinal manipulation, Spinal mobilization, Cryotherapy, Moist heat, Compression bandaging, scar mobilization, Splintting, Taping, Traction, Ultrasound, Ionotophoresis 58m/ml Dexamethasone, and Manual therapy  PLAN FOR NEXT SESSION: core stability.incorporate lumbar extension strengthening, incorporate more low back mobility    CRayetta Humphrey [email or web address] [phone] 03/26/2022, 9:15AM

## 2022-04-02 NOTE — Progress Notes (Signed)
   Referring Provider Denita Lung, MD [address] [place],  [place] [address]   CC: No chief complaint on file.     Laurie Mckinney is an 61 y.o. female.  HPI:   Patient is a 61 y.o. year old female here for follow up after completing physical therapy for pain related to macromastia.   She was seen for initial consult by Dr. Marla Roe on 12/02/2021.Marland Kitchen  At that time, patient reported a history of mammary hyperplasia for several years and complained of symptoms including back pain, neck pain, grooving bra straps and activities hindered by her enlarged breasts.  STN was found to be 38 cm on the right and 39 cm on the left.  The IMF distance was 16 cm.  Her BMI was 31.2 kg/m.  Her preoperative bra size is a DDD cup.  Patient reported that she want this to be a full C/D cup.  Plan to remove approximately 750 g of breast tissue from each side.  Today, patient states she is doing well.  She reports she finished 6 sessions of physical therapy.  She states that after completing physical therapy, her symptoms are still present.  She reports she still has some upper back pain, shoulder pain and lower back pain.  She denies any rashes underneath her breasts.  Patient reports she recently had a mammogram completed which was found to be normal.   Review of Systems General: Denies fevers, chills shortness of breath.  No changes in interim health. MSK: Endorses ongoing back and neck discomfort Skin: Denies rashes  Physical Exam    03/10/2022    9:00 AM 03/09/2022    9:23 AM 02/02/2022    8:00 AM  Vitals with BMI  Height '5\' 11"'$  '5\' 11"'$  '5\' 11"'$   Weight 213 lbs 218 lbs 6 oz 212 lbs  BMI 29.72 76.72 09.47  Systolic 096 283 662  Diastolic 67 72 76  Pulse 73 65 72    General:  No acute distress,  Alert and oriented, Non-Toxic, Normal speech and affect Psych: Normal behavior and mood Respiratory: No increased WOB MSK: Ambulatory  Assessment/Plan  Patient is interested in pursuing  surgical intervention for bilateral breast reduction. Patient has completed at least 6 weeks of physical therapy for pain related to macromastia.  Discussed with patient we would submit to insurance for authorization, discussed approval could take up to 6 weeks.   Discussed with patient that if she were to have any questions or concerns she may call our clinic.  Clance Boll 04/02/2022, 10:07 AM

## 2022-04-03 ENCOUNTER — Ambulatory Visit: Payer: BC Managed Care – PPO | Admitting: Student

## 2022-04-03 VITALS — Ht 71.0 in | Wt 218.6 lb

## 2022-04-03 DIAGNOSIS — N62 Hypertrophy of breast: Secondary | ICD-10-CM

## 2022-04-03 DIAGNOSIS — M545 Low back pain, unspecified: Secondary | ICD-10-CM

## 2022-04-03 DIAGNOSIS — M25511 Pain in right shoulder: Secondary | ICD-10-CM

## 2022-04-03 DIAGNOSIS — M549 Dorsalgia, unspecified: Secondary | ICD-10-CM

## 2022-04-03 DIAGNOSIS — M25512 Pain in left shoulder: Secondary | ICD-10-CM

## 2022-04-03 NOTE — Addendum Note (Signed)
Addended by: Donnamarie Rossetti on: 04/03/2022 03:39 PM   Modules accepted: Level of Service

## 2022-04-06 ENCOUNTER — Encounter (HOSPITAL_COMMUNITY): Payer: Self-pay

## 2022-04-06 ENCOUNTER — Ambulatory Visit (HOSPITAL_COMMUNITY): Payer: BC Managed Care – PPO | Attending: Plastic Surgery

## 2022-04-06 DIAGNOSIS — R293 Abnormal posture: Secondary | ICD-10-CM | POA: Diagnosis not present

## 2022-04-06 DIAGNOSIS — M5459 Other low back pain: Secondary | ICD-10-CM | POA: Diagnosis not present

## 2022-04-06 NOTE — Therapy (Signed)
OUTPATIENT PHYSICAL THERAPY THORACOLUMBAR EVALUATION   Patient Name: Laurie Mckinney MRN: [id number] DOB:January 22, 1961, 61 y.o., female Today's Date: 04/06/2022   PT End of Session - 04/06/22 0729     Visit Number 8    Number of Visits 14    Date for PT Re-Evaluation 04/21/22    Authorization Type [employer] COMM PPO (no copay, no auth)    Progress Note Due on Visit 14    PT Start Time 0730    PT Stop Time 0810    PT Time Calculation (min) 40 min    Activity Tolerance Patient tolerated treatment well    Behavior During Therapy WFL for tasks assessed/performed              Past Medical History:  Diagnosis Date   Adjustment insomnia    SHIFT WORK   Allergy    RHINITIS   Asthma    Atrial flutter ([place]) 02/26/15   [facility]   Back pain    Bursitis    right hip   Cataract    see last eye eexam note    Constipation    Depression    Diabetes mellitus without complication ([place])    History of atrial fibrillation    History of cancer    Hypertension    Incontinence    Joint pain    Migraine headache    Mobitz type 2 second degree heart block    Neuropathy    Obesity    Other fatigue    Shortness of breath on exertion    Sleep apnea    Past Surgical History:  Procedure Laterality Date   ABDOMINAL HYSTERECTOMY     ATRIAL FIBRILLATION ABLATION N/A 10/27/2019   Procedure: ATRIAL FIBRILLATION ABLATION;  Surgeon: Constance Haw, MD;  Location: [place] [facility];  Service: Cardiovascular;  Laterality: N/A;   CARDIOVERSION N/A 05/25/2019   Procedure: CARDIOVERSION;  Surgeon: Arnoldo Lenis, MD;  Location: [facility];  Service: Endoscopy;  Laterality: N/A;   COLONOSCOPY  2011   Dr.Brodie   ROBOTIC ASSISTED TOTAL HYSTERECTOMY WITH BILATERAL SALPINGO OOPHERECTOMY Bilateral 02/05/2015   Procedure: ROBOTIC ASSISTED TOTAL LAPAROSCOPIC HYSTERECTOMY WITH BILATERAL SALPINGO OOPHORECTOMY;  Surgeon: Everitt Amber, MD;  Location: [facility];  Service: Gynecology;   Laterality: Bilateral;   Patient Active Problem List   Diagnosis Date Noted   Peripheral neuropathy 12/18/2021   Symptomatic mammary hypertrophy 12/02/2021   Recurrent UTI 09/02/2021   Lacerations of multiple sites of left arm 08/11/2021   Bilateral pendulous breasts 08/11/2021   Anxiety and depression 02/12/2021   Class 2 severe obesity with serious comorbidity and body mass index (BMI) of 37.0 to 37.9 in adult [facility]) 12/12/2020   Hyperlipidemia associated with type 2 diabetes mellitus ([place]) 11/14/2020   History of hysterectomy for cancer 08/19/2020   Secondary hypercoagulable state ([place]) 11/22/2019   Female stress incontinence 10/04/2019   Diabetic neuropathy ([place]) 09/14/2019   OSA (obstructive sleep apnea) 07/13/2019   Varicose veins of lower extremity 04/14/2019   Atrial fibrillation ([place]) 04/14/2019   Hearing loss 04/13/2019   Diabetes mellitus ([place]) 09/29/2018   Second degree heart block 05/05/2018   Chronic low back pain without sciatica 05/05/2018   Hypertension associated with diabetes ([place]) 09/20/2017   Actinic keratosis 02/24/2017   Dysthymia 01/10/2016   History of endometrial cancer 02/05/2015    Class: Stage 1   History of migraine headaches 03/18/2011   Allergic rhinitis due to pollen 03/18/2011   Obesity (BMI 30-39.9)  03/18/2011    PCP: Jill Alexanders  REFERRING PROVIDER: Wallace Going, DO   REFERRING DIAG:   [phone] (ICD-10-CM) - Chronic bilateral low back pain without sciatica   Rationale for Evaluation and Treatment Rehabilitation  THERAPY DIAG:  Other low back pain  Abnormal posture  ONSET DATE: 2019  SUBJECTIVE:                                                                                                                                                                                           SUBJECTIVE STATEMENT:  Pt states that she is doing pretty good this morning. Most of pain in right leg. Mid back feeling ok for the moment.     PERTINENT HISTORY:  Low back pain B/L, pain in upper thoracic region.  Occasionally hands swell. Bursitis of right hip  PAIN:  Are you having pain? No  NPRS scale: 2/10 Pain location: low back and upper back around T1  Pain description: dull pain in back  Aggravating factors: standing at workstation and bent over, ie washing dishes Relieving factors: shifting weight on legs, propping leg up on stool, sit down    PRECAUTIONS: None    PATIENT GOALS  to improve back pain, feel good and be healthy   OBJECTIVE:   DIAGNOSTIC FINDINGS:  FINDINGS: Five non-rib-bearing lumbar vertebrae with anatomic posterior alignment. Straightening of the usual lumbar lordosis. No fractures. Slight thoracolumbar dextroscoliosis. Ankylosis of the facet joints from T12-L1 through L3-4. Mild disc space narrowing at L3-4 and L4-5. Sacroiliac joints anatomically aligned with mild degenerative changes, but without evidence of ankylosis.   IMPRESSION: 1. No acute osseous abnormality. 2. Ankylosis of the facet joints from T12-L1 through L3-4. While nonspecific, this can be a manifestation of ankylosing spondylitis. 3. Mild degenerative disc disease at L3-4 and L4-5.  PATIENT SURVEYS:  FOTO 63    LUMBAR ROM:   Active  A/PROM  eval AROM 03/24/22  Flexion WFL 6cm Initial starting point, bisecting line of PSIS and 15cm above   Extension 60% limited 3cm Initial starting point, bisecting line of PSIS and 15cm above   Right lateral flexion To mid thigh 59cm  Left lateral flexion To top of knee 59cm*  Right rotation Limited in comparison to left rotation 20% limited * Wfl*  Left rotation WFL* Wfl*   (Blank rows = not tested)  * pain    LOWER EXTREMITY MMT:    MMT Right eval Left eval RIGHT  03/24/22 Left  03/24/22  Hip flexion '5 5 5 5  ' Hip extension 3+ 3+ 4 4  Hip abduction 4 4 5* pain across low back  5  Hip adduction '5 5 5 5  ' Hip internal rotation      Hip external rotation       Knee flexion '5 5 5 5  ' Knee extension '5 5 5 5  ' Ankle dorsiflexion '5 5 5 5  ' Ankle plantarflexion      Ankle inversion      Ankle eversion       (Blank rows = not tested) FUNCTIONAL TESTS:  2 minute walk test: 508'    TODAY'S TREATMENT  04/06/22 Seated piriformis stretch 10 sec x4 b/l Supine hamstring stretch with strap 10 sec x4 b/l Physioball LTR x10 b/l  Physio ball forward rolls and diagonal rolls x10 each  Stair lunge stretch b/l  Hip CARS x10  Shoulder extensions blue theraband x30 Standing rows x30 Lifts with rotation 10# on cable column x10 b/l , inc timr for demo and manual correction with mirror feedback Standing lateral flexion reaches, seated forward bend into full extension x3 each              03/25/22             Standing:  hip excursion x 3                              Postural theraband ex :  scapular retraction, rows and extension x 10 red                               Functional squats for proper lifting   x 10             Seated:    physioball forward roles x15                     physioball lateral roles x 10                             Thoracic excursions x 2             Supine:    abdominal set x 10                             Bridge x 10 hold 10"                             Active hamstring stretch x 3 for 20"                              03/24/22  Reassessment  MET into hip flexion/extension b/l  Manual resisted hip abd/add in hooklying  Grade 2 +3 mobilization L4-L5  STW to b/l low back    03/17/22  Nustep level 7 twin hills x4 mins  Foam roll for low back  Half foam in supine marches x20 b/l-> shoulder extensions x15 with blue therband  Seated physioball forward rolls with 5 sec holds x15  Supine hamstring/calf stretch 10 sec holds x5 b/l   With b/l UE holding strap, flexion/extension to behind head 2x5  STM to low back and thoracic spine    03/13/22  Physioball dktc x15  Physioball LTR x10  Cat/camel x10  Seated physioball forward roles  x15   Seated  physioball lateral roles x10  Hamstring stretch 10 sec hold x3 b/l  Blue theraband shoulder extensions2x10  Blue theraband rows 2x10  Lat pull down 30# 2x10  Palloff walkouts lateral  1 plate x5 reps each  Stair stretch 5 sec x5 b/l     PATIENT EDUCATION:  PATIENT EDUCATION: HEP [id number]                         Education method: Explanation, Demonstration, and Handouts Education comprehension: verbalized understanding, returned demonstration, verbal cues required, and tactile cues required    HOME EXERCISE PROGRAM: 6/22:   Postural theraband ex :  scapular retraction, rows and extension x 10 red Access Code: [email or web address] URL: [email or web address] Date: 03/13/2022 Prepared by: Leota Jacobsen  Exercises - Supine Lower Trunk Rotation with Swiss Ball  - 2 x daily - 7 x weekly - 2 sets - 10 reps - 3 hold - Supine Hip and Knee Flexion AROM with Swiss Ball  - 2 x daily - 7 x weekly - 2 sets - 10 reps - 5 hold - Kneeling Thoracic Extension Stretch with Swiss Ball  - 2 x daily - 7 x weekly - 2 sets - 10 reps - 5 hold  Tall posture x 5 Scapular retraction x 5 Abdominal set x 10 Thoracic excursion x 3 Supine: Bridge x 10  ASSESSMENT:  CLINICAL IMPRESSION: Pt tolerates session well. Pt with most difficulties with lift with rotation, pt consistently lifts and side bends in attempts to extend. Pt requires multi demo, manual cueing and mirror feedback to improve technique. Patient with increased difficulty creating stability in trunk with lifting and adding weight above shoulder height. Patient will continue to benefit from skilled PT services to improve core/trunk stability and decrease pain.  OBJECTIVE IMPAIRMENTS decreased endurance, decreased ROM, postural dysfunction, and pain.   ACTIVITY LIMITATIONS carrying, lifting, bending, and standing  PARTICIPATION LIMITATIONS: cleaning, laundry, shopping, community activity, and yard work  [place] and 3+ comorbidities: Back pain, BMI over 30, Congestive Heart Failure or Heart Disease, Depression, Diabetes Type I or II, High Blood Pressure, Incontinence, Sleep dysfunction  are also affecting patient's functional outcome.   REHAB POTENTIAL: Good  CLINICAL DECISION MAKING: Stable/uncomplicated  EVALUATION COMPLEXITY: Moderate   GOALS: Goals reviewed with patient? Yes  SHORT TERM GOALS: Target date: 04/20/2022  Patient will be independent with HEP in order to improve functional outcomes. Baseline:  Current: Independent  Goal status:Met  2.  Patient will report at least 25% improvement in symptoms for improved quality of life. Baseline:  Current: 35% Goal status: MET   LONG TERM GOALS: Target date: 05/04/2022  Patient will report at least 75% improvement in symptoms for improved quality of life. Baseline:  Current 03/24/22: 35% Goal status:Ongoing  2.  Patient will improve FOTO score by at least 10 points in order to indicate improved tolerance to activity. Baseline: 60 Current: 63 Goal status: Ongoing  3.  Patient will improve b/l glute strength to greater than or equal to 4/5 to reduce back strain and improve hip extension for better posture Baseline: 3+/5 Current: 4/5 Goal status: MET 4. Patient will be pain free in lumbar rotation and side bending and improve ability to complete home chores.  Current: 2/10  Goal status: new goal added     PLAN:   PT FREQUENCY: 2x/week  PT DURATION: 4 weeks  PLANNED INTERVENTIONS: PLANNED INTERVENTIONS: Therapeutic exercises, Therapeutic activity, Neuromuscular re-education, Balance training, Gait training, Patient/Family education,  Joint manipulation, Joint mobilization, Stair training, Orthotic/Fit training, DME instructions, Aquatic Therapy, Dry Needling, Electrical stimulation, Spinal manipulation, Spinal mobilization, Cryotherapy, Moist heat, Compression bandaging, scar mobilization, Splintting, Taping, Traction,  Ultrasound, Ionotophoresis 64m/ml Dexamethasone, and Manual therapy  PLAN FOR NEXT SESSION: core stability.incorporate lumbar extension strengthening, incorporate more low back mobility    Macai Sisneros PT, DPT  04/06/2022, 9:15AM

## 2022-04-14 ENCOUNTER — Telehealth: Payer: Self-pay

## 2022-04-14 DIAGNOSIS — R32 Unspecified urinary incontinence: Secondary | ICD-10-CM

## 2022-04-14 NOTE — Telephone Encounter (Signed)
Received fax from [facility] for a refill on Oxybutynin last apt was 03/09/22 next apt 03/22/23.

## 2022-04-15 ENCOUNTER — Telehealth: Payer: Self-pay

## 2022-04-15 ENCOUNTER — Encounter (HOSPITAL_COMMUNITY): Payer: BC Managed Care – PPO

## 2022-04-15 ENCOUNTER — Telehealth (HOSPITAL_COMMUNITY): Payer: Self-pay

## 2022-04-15 DIAGNOSIS — R32 Unspecified urinary incontinence: Secondary | ICD-10-CM

## 2022-04-15 NOTE — Telephone Encounter (Signed)
No show, called and left message concerning missed apt today.  Reminded next apt date and time with contact information included and requested call if unable to make next apt.  Ihor Austin, LPTA/CLT; Delana Meyer [phone]

## 2022-04-15 NOTE — Telephone Encounter (Signed)
Received fax from [facility] for a refill on her Oxybutynin last apt was 03/09/22 next apt is 03/22/23.

## 2022-04-20 ENCOUNTER — Encounter (INDEPENDENT_AMBULATORY_CARE_PROVIDER_SITE_OTHER): Payer: Self-pay | Admitting: Adult Health

## 2022-04-20 ENCOUNTER — Encounter (HOSPITAL_COMMUNITY): Payer: BC Managed Care – PPO

## 2022-04-20 ENCOUNTER — Ambulatory Visit (INDEPENDENT_AMBULATORY_CARE_PROVIDER_SITE_OTHER): Payer: BC Managed Care – PPO | Admitting: Adult Health

## 2022-04-20 ENCOUNTER — Telehealth: Payer: Self-pay

## 2022-04-20 VITALS — BP 102/63 | HR 58 | Temp 97.4°F | Ht 71.0 in | Wt 212.0 lb

## 2022-04-20 DIAGNOSIS — Z7985 Long-term (current) use of injectable non-insulin antidiabetic drugs: Secondary | ICD-10-CM

## 2022-04-20 DIAGNOSIS — Z6829 Body mass index (BMI) 29.0-29.9, adult: Secondary | ICD-10-CM | POA: Diagnosis not present

## 2022-04-20 DIAGNOSIS — E1169 Type 2 diabetes mellitus with other specified complication: Secondary | ICD-10-CM

## 2022-04-20 DIAGNOSIS — E669 Obesity, unspecified: Secondary | ICD-10-CM | POA: Diagnosis not present

## 2022-04-20 NOTE — Telephone Encounter (Signed)
Called patient, LMVM case has been sent off for pre-determination. Should hear back from insurance company within 2-3 weeks.  Faxed Pre-Determination to: [phone]

## 2022-04-21 NOTE — Progress Notes (Unsigned)
Chief Complaint:   OBESITY Laurie Mckinney is here to discuss her progress with her obesity treatment plan along with follow-up of her obesity related diagnoses. Laurie Mckinney is on the Category 3 Plan and states she is following her eating plan approximately 35% of the time. Laurie Mckinney states she is walking for 25 minutes 7 times per week.  Today's visit was #: 23 Starting weight: 268 lbs Starting date: 11/14/2020 Today's weight: 212 lbs Today's date: 04/20/2022 Total lbs lost to date: 31 Total lbs lost since last in-office visit: 1  Interim History: ***  Subjective:   1. Type 2 diabetes mellitus with other specified complication, without long-term current use of insulin (HCC) ***  Assessment/Plan:   1. Type 2 diabetes mellitus with other specified complication, without long-term current use of insulin (HCC) ***  2. Obesity with current BMI 29.6 Laurie Mckinney is currently in the action stage of change. As such, her goal is to continue with weight loss efforts. She has agreed to the Category 3 Plan.   Exercise goals: As is.   Behavioral modification strategies: increasing lean protein intake, decreasing simple carbohydrates, meal planning and cooking strategies, keeping healthy foods in the home, and planning for success.  Laurie Mckinney has agreed to follow-up with our clinic in 4 weeks. She was informed of the importance of frequent follow-up visits to maximize her success with intensive lifestyle modifications for her multiple health conditions.   Objective:   Blood pressure 102/63, pulse (!) 58, temperature (!) 97.4 F (36.3 C), height '5\' 11"'$  (1.803 m), weight 212 lb (96.2 kg), last menstrual period 01/26/2011, SpO2 99 %. Body mass index is 29.57 kg/m.  General: Cooperative, alert, well developed, in no acute distress. HEENT: Conjunctivae and lids unremarkable. Cardiovascular: Regular rhythm.  Lungs: Normal work of breathing. Neurologic: No focal deficits.   Lab Results  Component  Value Date   CREATININE 0.79 03/09/2022   BUN 13 03/09/2022   NA 141 03/09/2022   K 4.5 03/09/2022   CL 102 03/09/2022   CO2 25 03/09/2022   Lab Results  Component Value Date   ALT 18 03/09/2022   AST 22 03/09/2022   ALKPHOS 77 03/09/2022   BILITOT 0.5 03/09/2022   Lab Results  Component Value Date   HGBA1C 5.5 03/09/2022   HGBA1C 5.8 (A) 12/18/2021   HGBA1C 5.8 (A) 06/20/2021   HGBA1C 6.1 (H) 05/13/2021   HGBA1C 6.4 (A) 02/10/2021   Lab Results  Component Value Date   INSULIN 9.7 05/13/2021   INSULIN 4.0 11/14/2020   Lab Results  Component Value Date   TSH 0.724 11/14/2020   Lab Results  Component Value Date   CHOL 108 03/09/2022   HDL 62 03/09/2022   LDLCALC 37 03/09/2022   TRIG 27 03/09/2022   CHOLHDL 1.7 03/09/2022   Lab Results  Component Value Date   VD25OH 40.5 11/14/2020   VD25OH 35 08/14/2011   Lab Results  Component Value Date   WBC 8.0 03/09/2022   HGB 13.6 03/09/2022   HCT 40.1 03/09/2022   MCV 91 03/09/2022   PLT 271 03/09/2022   No results found for: "IRON", "TIBC", "FERRITIN"  Attestation Statements:   Reviewed by clinician on day of visit: allergies, medications, problem list, medical history, surgical history, family history, social history, and previous encounter notes.  Time spent on visit including pre-visit chart review and post-visit care and charting was 28 minutes.   Wilhemena Durie, am acting as Location manager for Mina Marble, NP.  I have  reviewed the above documentation for accuracy and completeness, and I agree with the above. -  ***

## 2022-04-23 ENCOUNTER — Encounter (HOSPITAL_COMMUNITY): Payer: BC Managed Care – PPO

## 2022-04-27 ENCOUNTER — Telehealth: Payer: Self-pay

## 2022-04-27 NOTE — Telephone Encounter (Signed)
Garment/textile technologist as requested from [place] with [employer]

## 2022-04-28 ENCOUNTER — Encounter (HOSPITAL_COMMUNITY): Payer: BC Managed Care – PPO | Admitting: Physical Therapy

## 2022-04-29 ENCOUNTER — Encounter (HOSPITAL_COMMUNITY): Payer: BC Managed Care – PPO | Admitting: Physical Therapy

## 2022-05-07 ENCOUNTER — Telehealth: Payer: Self-pay

## 2022-05-07 NOTE — Telephone Encounter (Signed)
Refaxed patients notes, PT, MMG and photos to [phone]. Insurance company did not receive all information. Web- site indicate for a breast reduction must be mailed or faxed in for pre-determination. Called patient and advised the situation of delay of processing the request for breast reduction.

## 2022-05-13 ENCOUNTER — Encounter (INDEPENDENT_AMBULATORY_CARE_PROVIDER_SITE_OTHER): Payer: Self-pay

## 2022-05-14 ENCOUNTER — Telehealth: Payer: Self-pay

## 2022-05-14 NOTE — Telephone Encounter (Signed)
Pre-Determination was denied. Requested an Appeal. Faxed [phone]. Called patient, LMVM advising the status of the process for surgery

## 2022-05-20 ENCOUNTER — Other Ambulatory Visit: Payer: Self-pay | Admitting: Family Medicine

## 2022-05-20 DIAGNOSIS — E1169 Type 2 diabetes mellitus with other specified complication: Secondary | ICD-10-CM

## 2022-05-25 ENCOUNTER — Ambulatory Visit (INDEPENDENT_AMBULATORY_CARE_PROVIDER_SITE_OTHER): Payer: BC Managed Care – PPO | Admitting: Adult Health

## 2022-05-25 ENCOUNTER — Encounter (INDEPENDENT_AMBULATORY_CARE_PROVIDER_SITE_OTHER): Payer: Self-pay | Admitting: Adult Health

## 2022-05-25 VITALS — BP 113/74 | HR 61 | Temp 97.9°F | Ht 71.0 in | Wt 211.0 lb

## 2022-05-25 DIAGNOSIS — E1169 Type 2 diabetes mellitus with other specified complication: Secondary | ICD-10-CM | POA: Diagnosis not present

## 2022-05-25 DIAGNOSIS — K5900 Constipation, unspecified: Secondary | ICD-10-CM

## 2022-05-25 DIAGNOSIS — E669 Obesity, unspecified: Secondary | ICD-10-CM | POA: Diagnosis not present

## 2022-05-25 DIAGNOSIS — Z6829 Body mass index (BMI) 29.0-29.9, adult: Secondary | ICD-10-CM

## 2022-05-25 DIAGNOSIS — Z7985 Long-term (current) use of injectable non-insulin antidiabetic drugs: Secondary | ICD-10-CM

## 2022-05-25 DIAGNOSIS — K5909 Other constipation: Secondary | ICD-10-CM

## 2022-05-25 MED ORDER — SEMAGLUTIDE (1 MG/DOSE) 4 MG/3ML ~~LOC~~ SOPN: 1.0000 mg | PEN_INJECTOR | SUBCUTANEOUS | 0 refills | Status: DC

## 2022-05-29 NOTE — Progress Notes (Unsigned)
Chief Complaint:   OBESITY Laurie Mckinney is here to discuss her progress with her obesity treatment plan along with follow-up of her obesity related diagnoses. Laurie Mckinney is on the Category 3 Plan and states she is following her eating plan approximately 25% of the time. Laurie Mckinney states she is walking 25 minutes 7 times per week.  Today's visit was #: 24 Starting weight: 268 lbs Starting date: 11/14/2020 Today's weight: 211 lbs Today's date: 05/25/2022 Total lbs lost to date: 57 lbs Total lbs lost since last in-office visit: 1 lb  Interim History: She has not been exercising as much as usual since last office visit.  Bioimpedance scale:  Muscle mass -7.2 lbs Adipose mass +6.6 lbs  Subjective:   1. Type 2 diabetes mellitus with other specified complication, without long-term current use of insulin ([place]) 01/2021 Dr Jearld Shines started her on Ozempic 0.25 mg.  She titrated up 1 mg once a week, constipation. Fasting <130.  2. Other constipation She consistently consumes beans, broccoli, smooth move tea, ex-lax.  Prior to [place] she was having a BM every 2-3 days, she now has to take a stimulant laxative to produce a BM.   Assessment/Plan:   1. Type 2 diabetes mellitus with other specified complication, without long-term current use of insulin (HCC) Refill - Semaglutide, 1 MG/DOSE, 4 MG/3ML SOPN; Inject 1 mg as directed once a week.  Dispense: 9 mL; Refill: 0  2. Other constipation ***Continue with fiber.  Start daily metamucil and stool softener.   3. Obesity with current BMI 29.5 Laurie Mckinney is currently in the action stage of change. As such, her goal is to continue with weight loss efforts. She has agreed to the Category 3 Plan.   Exercise goals:  As is.   Behavioral modification strategies: increasing lean protein intake, decreasing simple carbohydrates, meal planning and cooking strategies, keeping healthy foods in the home, and planning for success.  Laurie Mckinney has agreed to  follow-up with our clinic in 4 weeks. She was informed of the importance of frequent follow-up visits to maximize her success with intensive lifestyle modifications for her multiple health conditions.   Objective:   Blood pressure 113/74, pulse 61, temperature 97.9 F (36.6 C), height '5\' 11"'$  (1.803 m), weight 211 lb (95.7 kg), last menstrual period 01/26/2011, SpO2 99 %. Body mass index is 29.43 kg/m.  General: Cooperative, alert, well developed, in no acute distress. HEENT: Conjunctivae and lids unremarkable. Cardiovascular: Regular rhythm.  Lungs: Normal work of breathing. Neurologic: No focal deficits.   Lab Results  Component Value Date   CREATININE 0.79 03/09/2022   BUN 13 03/09/2022   NA 141 03/09/2022   K 4.5 03/09/2022   CL 102 03/09/2022   CO2 25 03/09/2022   Lab Results  Component Value Date   ALT 18 03/09/2022   AST 22 03/09/2022   ALKPHOS 77 03/09/2022   BILITOT 0.5 03/09/2022   Lab Results  Component Value Date   HGBA1C 5.5 03/09/2022   HGBA1C 5.8 (A) 12/18/2021   HGBA1C 5.8 (A) 06/20/2021   HGBA1C 6.1 (H) 05/13/2021   HGBA1C 6.4 (A) 02/10/2021   Lab Results  Component Value Date   INSULIN 9.7 05/13/2021   INSULIN 4.0 11/14/2020   Lab Results  Component Value Date   TSH 0.724 11/14/2020   Lab Results  Component Value Date   CHOL 108 03/09/2022   HDL 62 03/09/2022   LDLCALC 37 03/09/2022   TRIG 27 03/09/2022   CHOLHDL 1.7 03/09/2022   Lab  Results  Component Value Date   VD25OH 40.5 11/14/2020   VD25OH 35 08/14/2011   Lab Results  Component Value Date   WBC 8.0 03/09/2022   HGB 13.6 03/09/2022   HCT 40.1 03/09/2022   MCV 91 03/09/2022   PLT 271 03/09/2022   No results found for: "IRON", "TIBC", "FERRITIN"  Attestation Statements:   Reviewed by clinician on day of visit: allergies, medications, problem list, medical history, surgical history, family history, social history, and previous encounter notes.  I, Davy Pique, RMA, am  acting as Location manager for Mina Marble, NP.  I have reviewed the above documentation for accuracy and completeness, and I agree with the above. -  ***

## 2022-06-10 ENCOUNTER — Encounter: Payer: Self-pay | Admitting: Internal Medicine

## 2022-06-16 ENCOUNTER — Telehealth: Payer: Self-pay

## 2022-06-16 NOTE — Telephone Encounter (Signed)
Called patient, LMVM her appeal was denied. She may request a 2nd appeal, and or we can provide a quote.

## 2022-06-19 ENCOUNTER — Other Ambulatory Visit: Payer: Self-pay | Admitting: Family Medicine

## 2022-06-19 DIAGNOSIS — I152 Hypertension secondary to endocrine disorders: Secondary | ICD-10-CM

## 2022-06-29 ENCOUNTER — Ambulatory Visit (INDEPENDENT_AMBULATORY_CARE_PROVIDER_SITE_OTHER): Payer: BC Managed Care – PPO | Admitting: Adult Health

## 2022-06-29 ENCOUNTER — Encounter (INDEPENDENT_AMBULATORY_CARE_PROVIDER_SITE_OTHER): Payer: Self-pay | Admitting: Adult Health

## 2022-06-29 VITALS — BP 106/70 | HR 64 | Temp 98.0°F | Ht 71.0 in | Wt 211.0 lb

## 2022-06-29 DIAGNOSIS — E785 Hyperlipidemia, unspecified: Secondary | ICD-10-CM | POA: Diagnosis not present

## 2022-06-29 DIAGNOSIS — E669 Obesity, unspecified: Secondary | ICD-10-CM | POA: Diagnosis not present

## 2022-06-29 DIAGNOSIS — Z6829 Body mass index (BMI) 29.0-29.9, adult: Secondary | ICD-10-CM

## 2022-06-29 DIAGNOSIS — E1169 Type 2 diabetes mellitus with other specified complication: Secondary | ICD-10-CM | POA: Diagnosis not present

## 2022-06-29 DIAGNOSIS — Z7985 Long-term (current) use of injectable non-insulin antidiabetic drugs: Secondary | ICD-10-CM

## 2022-06-29 DIAGNOSIS — Z6837 Body mass index (BMI) 37.0-37.9, adult: Secondary | ICD-10-CM

## 2022-06-29 NOTE — Progress Notes (Unsigned)
Chief Complaint:   OBESITY Laurie Mckinney is here to discuss her progress with her obesity treatment plan along with follow-up of her obesity related diagnoses. Laurie Mckinney is on the Category 3 Plan and states she is following her eating plan approximately 25% of the time. Laurie Mckinney states she is walking 30-40 minutes 7 times per week.  Today's visit was #: 25 Starting weight: 268 lbs Starting date: 11/14/2020 Today's weight: 211 lbs Today's date: 06/29/2022 Total lbs lost to date: 57 lbs Total lbs lost since last in-office visit: 0  Interim History: She returned from [place], 5 day vacation.  She has increased walking to daily. Current cup 44 DD.  06/16/22. 1st appeal denied.  *** pendulous breasts.    Subjective:   1. Hyperlipidemia associated with type 2 diabetes mellitus (Laurie Mckinney) PCP provides atorvastatin 10 mg daily.  She denies myalgia's.  Lipid panel ***  Assessment/Plan:   1. Hyperlipidemia associated with type 2 diabetes mellitus (Laurie Mckinney) Follow up with PCP, statin dose.   2. Obesity with current BMI 29.5 Laurie Mckinney is currently in the action stage of change. As such, her goal is to continue with weight loss efforts. She has agreed to the Category 3 Plan.   Exercise goals:  As is.   Behavioral modification strategies: increasing lean protein intake, decreasing simple carbohydrates, meal planning and cooking strategies, keeping healthy foods in the home, and planning for success.  Laurie Mckinney has agreed to follow-up with our clinic in 4 weeks. She was informed of the importance of frequent follow-up visits to maximize her success with intensive lifestyle modifications for her multiple health conditions.   Objective:   Blood pressure 106/70, pulse 64, temperature 98 F (36.7 C), height '5\' 11"'$  (1.803 m), weight 211 lb (95.7 kg), last menstrual period 01/26/2011, SpO2 95 %. Body mass index is 29.43 kg/m.  General: Cooperative, alert, well developed, in no acute  distress. HEENT: Conjunctivae and lids unremarkable. Cardiovascular: Regular rhythm.  Lungs: Normal work of breathing. Neurologic: No focal deficits.   Lab Results  Component Value Date   CREATININE 0.79 03/09/2022   BUN 13 03/09/2022   NA 141 03/09/2022   K 4.5 03/09/2022   CL 102 03/09/2022   CO2 25 03/09/2022   Lab Results  Component Value Date   ALT 18 03/09/2022   AST 22 03/09/2022   ALKPHOS 77 03/09/2022   BILITOT 0.5 03/09/2022   Lab Results  Component Value Date   HGBA1C 5.5 03/09/2022   HGBA1C 5.8 (A) 12/18/2021   HGBA1C 5.8 (A) 06/20/2021   HGBA1C 6.1 (H) 05/13/2021   HGBA1C 6.4 (A) 02/10/2021   Lab Results  Component Value Date   INSULIN 9.7 05/13/2021   INSULIN 4.0 11/14/2020   Lab Results  Component Value Date   TSH 0.724 11/14/2020   Lab Results  Component Value Date   CHOL 108 03/09/2022   HDL 62 03/09/2022   LDLCALC 37 03/09/2022   TRIG 27 03/09/2022   CHOLHDL 1.7 03/09/2022   Lab Results  Component Value Date   VD25OH 40.5 11/14/2020   VD25OH 35 08/14/2011   Lab Results  Component Value Date   WBC 8.0 03/09/2022   HGB 13.6 03/09/2022   HCT 40.1 03/09/2022   MCV 91 03/09/2022   PLT 271 03/09/2022   No results found for: "IRON", "TIBC", "FERRITIN"  Attestation Statements:   Reviewed by clinician on day of visit: allergies, medications, problem list, medical history, surgical history, family history, social history, and previous encounter  notes.  I, Davy Pique, RMA, am acting as Location manager for Mina Marble, NP.  I have reviewed the above documentation for accuracy and completeness, and I agree with the above. -  ***

## 2022-07-02 ENCOUNTER — Ambulatory Visit: Payer: BC Managed Care – PPO | Admitting: Adult Health

## 2022-07-02 NOTE — Progress Notes (Signed)
Laurie Mckinney: Laurie Mckinney DOB: 01-Sep-1961  REASON FOR VISIT: follow up HISTORY FROM: Laurie Mckinney  Chief Complaint  Laurie Mckinney presents with   Follow-up    Pt in 70  Pt here for CPAP f/u   Pt states she has become annoyed  with CPAP machine and wants to know if she has to have it for rest of Laurie Mckinney life       HISTORY OF PRESENT ILLNESS: Today 07/06/22:  Laurie Mckinney is a 60 year old female with a history of obstructive sleep apnea on CPAP and peripheral neuropathy.  She returns today for follow-up.  She does use the CPAP nightly.  However she does feel that she is sleeping lighter and is more aware of the CPAP.  She does find it annoying.  She does state that she has lost weight since Laurie Mckinney original study.  She also feels that she is in better health since Laurie Mckinney original study.  Possibly interested in inspire.   Still has numbness and pain in the toes. Has the gabapentin but does use it. SHe is just tolerating it at this time. Taking OTC supplement nervive. Walking more and feels that that has helped.     07/01/21: Laurie Mckinney is a 61 year old female with a history of obstructive sleep apnea on CPAP and neuropathy.  She returns today for follow-up.  She reports that the CPAP works well for Laurie Mckinney.  She even tries to use it when she takes a nap.  Denies any issues.  Neuropathy: She feels that is gotten slightly worse.  Still only extends up to the ankles.  Primarily has numbness in the toes.  Notices most of Laurie Mckinney discomfort when she gets in the bed.  States that she occasionally gets sharp shooting pains but otherwise the numbness is what bothers Laurie Mckinney the most.  She is a Laurie Mckinney at healthy weight and wellness.  Reports that she is lost 16 pounds.  Laurie Mckinney last hemoglobin A1c was 6.7.  Reports that she is trying to lower Laurie Mckinney sugar numbers.    HISTORY (copied from Dr. Cathren Laine note) 09/14/2019: Since I last saw Laurie Mckinney she was diagnosed with sleep apnea, she underwent sleep study on July 09, 2019 which showed  severe mixed sleep apnea and was recommended initiation of CPAP, she is been extremely compliant.  Requested back today by [name] DPM for a new issue, she had a foot exam because she has numbness in the bottom of Laurie Mckinney feet, if she has slippers on she can't tell whether Laurie Mckinney shoes are on or off, numbness, when she shaves she has numbness up Laurie Mckinney legs, a numbing sensation to above the ankles. She saw a podiatrist, she saw Dr. Redmond School.  She is obese, she has diabetes, last HgbA1c was 6.6(reviewed labs) but has been higher per Laurie Mckinney. Brother drank a 6-pack for 30 years and had neuropathy but no other family history. Really numb, no significant pain, notices it more at night, nothing makes it better or worse, Laurie Mckinney feet get hot, ongoing for years, slowly progressive, symmetric. No other focal neurologic deficits, associated symptoms, inciting events or modifiable factors..No weakness. No lumbar radiculopathy or connection with the low back.     I reviewed Laurie Mckinney's notes from Dr. [name], Laurie Mckinney has symptoms in both feet, she has noticed this as an example shaving Laurie Mckinney legs, the feet feels a sensitive up to Laurie Mckinney ankle, she does notice numbness more so at rest without Laurie Mckinney shoes on, noticeable more so this year, recently diagnosed as diabetic,  unclear how long she was prediabetic for a while, she has been advised on trying to be diet controlled, lots of healthcare issues, dizziness, slurring speech, worsening control of hands, following, she has been evaluated by neurology and cardiology and diagnosed with A. 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 fib and Provigil for Laurie Mckinney sleeping disorder, palpable pedal pulses, pronounced fifth metatarsal head bilaterally with callus under  the right loss of deep vibratory sensation at the left foot metatarsal heads, intact but diminished positional testing of the hallux, x-rays (reviewed reports) mild residual metatarsus abductus foot type, with tailor's bunion appreciated bilaterally, spurring noted on the distal phalanx medially, mild deformity bilaterally, on lateral view relatively high arched foot type, no subluxation, dislocation of arch diagnosed with peripheral neuropathy and numbness of the foot.   TSH nml, B12 976, hepc negative.    I reviewed MRi cervical spine images and agree with the following: MRI cervical spine (without) demonstrating: - At C5-6: disc bulging with borderline mild spinal stenosis and moderate left foraminal stenosis; no cord signal abnormalities.   HPI:  Laurie Mckinney is a 61 y.o. female here as requested by Dr. Glade Lloyd for aphasia.  Past medical history obesity, Mobitz type II second-degree heart block, migraines, hypertension, diabetes, a fib, asthma, adjustment insomnia.She is here with Laurie Mckinney husband. She "cannot control Laurie Mckinney body", she is wobbly, she is short of breath, she misjudges everything, she throws things and they don;t end up where she wants, Laurie Mckinney long term memory is doing well but can;t get Laurie Mckinney brain to function, poor eye-hand coordination, she feels very fatigued. Here with hr husband who provides much information. She feels Laurie Mckinney depression and anxiety, no focal weakness, no facial droop, no dysarthria. 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    Reviewed notes, labs and imaging from outside physicians, which showed: I reviewed Dr. Madelyn Brunner notes.  She was most recently seen earlier this month.  Laurie Mckinney reported dizziness and new onset atrial fibrillation ended up seeing A. fib clinic July 15 and was sent to the emergency room for possible stroke.  She  continues to feel "dizzy and loopy".  She has trouble with coordination between Laurie Mckinney brain or arms and not feeling herself.  She only has a note writing Laurie Mckinney out of work through the end of this week and that she is not safe to drive or go back to work next week.  She continues to feel dizzy and loopy.  Trouble with concentration between Laurie Mckinney brain and arms.  Trouble finding words at times are making clear thoughts at times.  Processing speed seems off.  She is concerned about sleep apnea given history of snoring, she does have pauses in Laurie Mckinney sleep according to Laurie Mckinney husband, she is gained 30 pounds in the last 4 years   Personally reviewed MRI of the brain April 19, 2019 and agree with the following:No acute or significant brain finding. Normal study with exception of a few punctate foci of T2 and FLAIR signal within the white matter consistent with minimal small vessel change. There is a significant amount of stress, she works overnight, she  doesn't sleep well.   TSH normal hgba1c 6.8 Cbc/cmp unremarkable B12 976    REVIEW OF SYSTEMS: Out of a complete 14 system review of symptoms, the Laurie Mckinney complains only of the following symptoms, and all other reviewed systems are negative.    Fatigue severity score 16 Epworth sleepiness score 8  ALLERGIES: No Known Allergies  HOME MEDICATIONS: Outpatient Medications Prior to Visit  Medication Sig Dispense Refill   acetaminophen (TYLENOL) 650 MG CR tablet Take 650 mg by mouth every 8 (eight) hours as needed for pain.     atorvastatin (LIPITOR) 10 MG tablet TAKE 1 TABLET DAILY 90 tablet 2   buPROPion (WELLBUTRIN SR) 200 MG 12 hr tablet Take 1 tablet (200 mg total) by mouth daily. 90 tablet 1   Lancets (ONETOUCH DELICA PLUS [id number]) MISC USE AND DISCARD 1 LANCET   DAILY 100 each 3   lisinopril-hydrochlorothiazide (ZESTORETIC) 20-25 MG tablet TAKE 1 TABLET DAILY (DOSE  INCREASE) 90 tablet 1   Misc Natural Products (IMMUNE FORMULA PO) Take 2 tablets by mouth  daily. immuneti     Multiple Vitamins-Minerals (CENTRUM SILVER 50+WOMEN) TABS Take 1 tablet by mouth daily.     mupirocin ointment (BACTROBAN) 2 % Apply topically 2 (two) times daily. (Laurie Mckinney taking differently: Apply topically as needed.) 15 g 0   ONETOUCH VERIO test strip USE AND DISCARD 1 TEST     STRIP AS NEEDED AS         INSTRUCTED 100 each 3   oxybutynin (DITROPAN-XL) 10 MG 24 hr tablet TAKE 1 TABLET AT BEDTIME 90 tablet 1   PARoxetine (PAXIL) 20 MG tablet Take 1 tablet (20 mg total) by mouth daily. 90 tablet 1   Semaglutide, 1 MG/DOSE, 4 MG/3ML SOPN Inject 1 mg as directed once a week. 9 mL 0   UNABLE TO FIND Take 1 tablet by mouth Nightly. Med Name: Theotis Burrow     Vibegron (GEMTESA) 75 MG TABS Take by mouth at bedtime.     XARELTO 20 MG TABS tablet TAKE 1 TABLET BY MOUTH DAILY WITH SUPPER. 90 tablet 0   No facility-administered medications prior to visit.    PAST MEDICAL HISTORY: Past Medical History:  Diagnosis Date   Adjustment insomnia    SHIFT WORK   Allergy    RHINITIS   Asthma    Atrial flutter ([place]) 02/26/15   [facility]   Back pain    Bursitis    right hip   Cataract    see last eye eexam note    Constipation    Depression    Diabetes mellitus without complication (HCC)    History of atrial fibrillation    History of cancer    Hypertension    Incontinence    Joint pain    Migraine headache    Mobitz type 2 second degree heart block    Neuropathy    Obesity    Other fatigue    Shortness of breath on exertion    Sleep apnea     PAST SURGICAL HISTORY: Past Surgical History:  Procedure Laterality Date   ABDOMINAL HYSTERECTOMY     ATRIAL FIBRILLATION ABLATION N/A 10/27/2019   Procedure: ATRIAL FIBRILLATION ABLATION;  Surgeon: Constance Haw, MD;  Location: [facility];  Service: Cardiovascular;  Laterality: N/A;   CARDIOVERSION N/A 05/25/2019   Procedure: CARDIOVERSION;  Surgeon: Arnoldo Lenis, MD;  Location: [facility];   Service: Endoscopy;  Laterality: N/A;   COLONOSCOPY  2011  Dr.Brodie   ROBOTIC ASSISTED TOTAL HYSTERECTOMY WITH BILATERAL SALPINGO OOPHERECTOMY Bilateral 02/05/2015   Procedure: ROBOTIC ASSISTED TOTAL LAPAROSCOPIC HYSTERECTOMY WITH BILATERAL SALPINGO OOPHORECTOMY;  Surgeon: Everitt Amber, MD;  Location: [facility];  Service: Gynecology;  Laterality: Bilateral;    FAMILY HISTORY: Family History  Problem Relation Age of Onset   Arthritis Mother    Heart disease Father    Hypertension Father    Hyperlipidemia Father    Sudden death Father    Neuropathy Brother        both legs    Diabetes Brother        no treatment that the pt knows of    Other Brother        drank a 6 pack of beer daily x 30 years    Sleep apnea Neg Hx     SOCIAL HISTORY: Social History   Socioeconomic History   Marital status: Married    Spouse name: [name]   Number of children: 2   Years of education: Not on file   Highest education level: Not on file  Occupational History   Occupation: Conservation officer, historic buildings for parent  Tobacco Use   Smoking status: Never   Smokeless tobacco: Never  Vaping Use   Vaping Use: Never used  Substance and Sexual Activity   Alcohol use: Yes    Alcohol/week: 1.0 standard drink of alcohol    Types: 1 Standard drinks or equivalent per week   Drug use: No   Sexual activity: Yes    Partners: Male    Comment: Has had hysterectomy.  Other Topics Concern   Not on file  Social History Narrative   Lives at home with husband   Right handed   Caffeine: 1 cup of coffee in the morning and <20 oz of soda per day   Social Determinants of Health   Financial Resource Strain: Not on file  Food Insecurity: Not on file  Transportation Needs: Not on file  Physical Activity: Not on file  Stress: Not on file  Social Connections: Not on file  Intimate Partner Violence: Not on file      PHYSICAL EXAM  Vitals:   07/06/22 0816  BP: 126/77  Pulse: 63  Weight: 212 lb 3.2 oz (96.3 kg)   Height: '5\' 11"'$  (1.803 m)   Body mass index is 29.6 kg/m.  Generalized: Well developed, in no acute distress   Neurological examination  Mentation: Alert oriented to time, place, history taking. Follows all commands speech and language fluent Cranial nerve II-XII: Pupils were equal round reactive to light. Extraocular movements were full, visual field were full on confrontational test. Facial sensation and strength were normal. Uvula tongue midline. Head turning and shoulder shrug  were normal and symmetric. Motor: The motor testing reveals 5 over 5 strength of all 4 extremities. Good symmetric motor tone is noted throughout.  Sensory: Pinprick and vibration sensation decreased in the lower extremities in a stocking-like pattern.  No evidence of extinction is noted.  Coordination: Cerebellar testing reveals good finger-nose-finger and heel-to-shin bilaterally.  Gait and station: Gait is normal.  Reflexes: Deep tendon reflexes are symmetric and normal bilaterally.   DIAGNOSTIC DATA (LABS, IMAGING, TESTING) - I reviewed Laurie Mckinney records, labs, notes, testing and imaging myself where available.  Lab Results  Component Value Date   WBC 8.0 03/09/2022   HGB 13.6 03/09/2022   HCT 40.1 03/09/2022   MCV 91 03/09/2022   PLT 271 03/09/2022      Component Value  Date/Time   NA 141 03/09/2022 1009   K 4.5 03/09/2022 1009   CL 102 03/09/2022 1009   CO2 25 03/09/2022 1009   GLUCOSE 102 (H) 03/09/2022 1009   GLUCOSE 133 10/11/2019 0945   BUN 13 03/09/2022 1009   CREATININE 0.79 03/09/2022 1009   CREATININE 0.74 10/11/2019 0945   CALCIUM 9.7 03/09/2022 1009   PROT 7.2 03/09/2022 1009   ALBUMIN 4.4 03/09/2022 1009   AST 22 03/09/2022 1009   ALT 18 03/09/2022 1009   ALKPHOS 77 03/09/2022 1009   BILITOT 0.5 03/09/2022 1009   GFRNONAA 82 11/14/2020 0944   GFRAA 95 11/14/2020 0944   Lab Results  Component Value Date   CHOL 108 03/09/2022   HDL 62 03/09/2022   LDLCALC 37 03/09/2022    TRIG 27 03/09/2022   CHOLHDL 1.7 03/09/2022   Lab Results  Component Value Date   HGBA1C 5.5 03/09/2022   Lab Results  Component Value Date   VITAMINB12 1,545 (H) 11/14/2020   Lab Results  Component Value Date   TSH 0.724 11/14/2020      ASSESSMENT AND PLAN 61 y.o. year old female  has a past medical history of Adjustment insomnia, Allergy, Asthma, Atrial flutter ([place]) (02/26/15), Back pain, Bursitis, Cataract, Constipation, Depression, Diabetes mellitus without complication ([place]), History of atrial fibrillation, History of cancer, Hypertension, Incontinence, Joint pain, Migraine headache, Mobitz type 2 second degree heart block, Neuropathy, Obesity, Other fatigue, Shortness of breath on exertion, and Sleep apnea. here with: -Good compliance 1.  Obstructive sleep apnea on CPAP   --Good treatment of apnea --Encouraged Laurie Mckinney to continue using CPAP nightly and greater than 4 hours each night -- We will repeat home sleep test pending results may consider other treatment options  2.  Peripheral neuropathy  -- Does not want to be on any medication at this time. --Encouraged Laurie Mckinney to continue to monitor Laurie Mckinney diet and encouraged exercise    Advised if symptoms worsen or she develops new symptoms she should let us know.  Follow-up in 1 year or sooner if needed   Ward Givens, MSN, NP-C 07/06/2022, 8:31 AM [facility] [address] [place], [place] [address] [phone]

## 2022-07-06 ENCOUNTER — Encounter: Payer: Self-pay | Admitting: Adult Health

## 2022-07-06 ENCOUNTER — Ambulatory Visit: Payer: BC Managed Care – PPO | Admitting: Adult Health

## 2022-07-06 VITALS — BP 126/77 | HR 63 | Ht 71.0 in | Wt 212.2 lb

## 2022-07-06 DIAGNOSIS — G4733 Obstructive sleep apnea (adult) (pediatric): Secondary | ICD-10-CM

## 2022-07-06 NOTE — Patient Instructions (Signed)
Your Plan:  Repeat home sleep test If your symptoms worsen or you develop new symptoms please let us know.    Thank you for coming to see [place] at [facility]. I hope we have been able to provide you high quality care today.  You may receive a patient satisfaction survey over the next few weeks. We would appreciate your feedback and comments so that we may continue to improve ourselves and the health of our patients.

## 2022-07-07 NOTE — Progress Notes (Signed)
Faxed letter to insurance 2nd request for pre determination showing BL reduction medically necessary. Faxed, PT, HWW, office notes, MMG and photos

## 2022-07-22 ENCOUNTER — Ambulatory Visit: Payer: BC Managed Care – PPO | Admitting: Neurology

## 2022-07-22 DIAGNOSIS — E1140 Type 2 diabetes mellitus with diabetic neuropathy, unspecified: Secondary | ICD-10-CM

## 2022-07-22 DIAGNOSIS — G4733 Obstructive sleep apnea (adult) (pediatric): Secondary | ICD-10-CM

## 2022-07-22 DIAGNOSIS — I152 Hypertension secondary to endocrine disorders: Secondary | ICD-10-CM

## 2022-07-22 DIAGNOSIS — I441 Atrioventricular block, second degree: Secondary | ICD-10-CM

## 2022-07-27 ENCOUNTER — Encounter: Payer: Self-pay | Admitting: Internal Medicine

## 2022-07-27 ENCOUNTER — Other Ambulatory Visit: Payer: Self-pay | Admitting: Family Medicine

## 2022-07-27 DIAGNOSIS — F341 Dysthymic disorder: Secondary | ICD-10-CM

## 2022-07-27 NOTE — Telephone Encounter (Signed)
Refill request last apt 03/09/22 next apt 03/22/23.

## 2022-07-31 ENCOUNTER — Encounter: Payer: Self-pay | Admitting: Adult Health

## 2022-08-03 ENCOUNTER — Encounter (INDEPENDENT_AMBULATORY_CARE_PROVIDER_SITE_OTHER): Payer: Self-pay | Admitting: Adult Health

## 2022-08-03 ENCOUNTER — Ambulatory Visit (INDEPENDENT_AMBULATORY_CARE_PROVIDER_SITE_OTHER): Payer: BC Managed Care – PPO | Admitting: Adult Health

## 2022-08-03 VITALS — BP 117/76 | HR 64 | Temp 98.1°F | Ht 71.0 in | Wt 208.0 lb

## 2022-08-03 DIAGNOSIS — E669 Obesity, unspecified: Secondary | ICD-10-CM | POA: Diagnosis not present

## 2022-08-03 DIAGNOSIS — Z7985 Long-term (current) use of injectable non-insulin antidiabetic drugs: Secondary | ICD-10-CM

## 2022-08-03 DIAGNOSIS — Z6829 Body mass index (BMI) 29.0-29.9, adult: Secondary | ICD-10-CM

## 2022-08-03 DIAGNOSIS — E1169 Type 2 diabetes mellitus with other specified complication: Secondary | ICD-10-CM | POA: Diagnosis not present

## 2022-08-04 ENCOUNTER — Encounter (INDEPENDENT_AMBULATORY_CARE_PROVIDER_SITE_OTHER): Payer: Self-pay | Admitting: Adult Health

## 2022-08-04 ENCOUNTER — Other Ambulatory Visit (INDEPENDENT_AMBULATORY_CARE_PROVIDER_SITE_OTHER): Payer: Self-pay | Admitting: Adult Health

## 2022-08-04 DIAGNOSIS — E1169 Type 2 diabetes mellitus with other specified complication: Secondary | ICD-10-CM

## 2022-08-04 LAB — HEMOGLOBIN A1C
Est. average glucose Bld gHb Est-mCnc: 123 mg/dL
Hgb A1c MFr Bld: 5.9 % — ABNORMAL HIGH (ref 4.8–5.6)

## 2022-08-04 MED ORDER — SEMAGLUTIDE (1 MG/DOSE) 4 MG/3ML ~~LOC~~ SOPN: 1.0000 mg | PEN_INJECTOR | SUBCUTANEOUS | 0 refills | Status: DC

## 2022-08-04 NOTE — Progress Notes (Signed)
Chief Complaint:   OBESITY Laurie Mckinney is here to discuss her progress with her obesity treatment plan along with follow-up of her obesity related diagnoses. Laurie Mckinney is on the Category 3 Plan and states she is following her eating plan approximately 35% of the time. Laurie Mckinney states she is walking 20-40 minutes 7 times per week.  Today's visit was #:  13 Starting weight: 268 lbs Starting date: 11/14/2020 Today's weight: 208 lbs Today's date: 08/03/2022 Total lbs lost to date: 60 lbs Total lbs lost since last in-office visit: 3 lbs  Interim History:  Bioimpedance results:   Muscle mass +1.6 lbs Adipose mass -4.2 lbs.    She can easily follow breakfast, and reports that lunch and dinner are bland.  Subjective:   1. Type 2 diabetes mellitus with other specified complication, without long-term current use of insulin (HCC) She is currently on Ozempic 1 mg only SEs are belching and constipation.   She endorses increased polyphagia.    Assessment/Plan:   1. Type 2 diabetes mellitus with other specified complication, without long-term current use of insulin (HCC) Check A1c if <6 =continue on Ozempic '1mg'$  If A1c >6 = increase to 2 Ozempic '2mg'$   After lab results, send MyChart message and refill Ozempic as appropriate.  - Hemoglobin A1c  2. Obesity with current BMI 29.1 Laurie Mckinney is currently in the action stage of change. As such, her goal is to continue with weight loss efforts. She has agreed to the Category 3 Plan.   Lunch 350-500 calories +35 protein Dinner 450-600 calories +40 protein  Exercise goals:  As is.   Behavioral modification strategies: increasing lean protein intake, decreasing simple carbohydrates, meal planning and cooking strategies, keeping healthy foods in the home, and planning for success.  Laurie Mckinney has agreed to follow-up with our clinic in 8 weeks. She was informed of the importance of frequent follow-up visits to maximize her success with intensive  lifestyle modifications for her multiple health conditions.   Laurie Mckinney was informed we would discuss her lab results at her next visit unless there is a critical issue that needs to be addressed sooner. Laurie Mckinney agreed to keep her next visit at the agreed upon time to discuss these results.  Objective:   Blood pressure 117/76, pulse 64, temperature 98.1 F (36.7 C), height '5\' 11"'$  (1.803 m), weight 208 lb (94.3 kg), last menstrual period 01/26/2011, SpO2 98 %. Body mass index is 29.01 kg/m.  General: Cooperative, alert, well developed, in no acute distress. HEENT: Conjunctivae and lids unremarkable. Cardiovascular: Regular rhythm.  Lungs: Normal work of breathing. Neurologic: No focal deficits.   Lab Results  Component Value Date   CREATININE 0.79 03/09/2022   BUN 13 03/09/2022   NA 141 03/09/2022   K 4.5 03/09/2022   CL 102 03/09/2022   CO2 25 03/09/2022   Lab Results  Component Value Date   ALT 18 03/09/2022   AST 22 03/09/2022   ALKPHOS 77 03/09/2022   BILITOT 0.5 03/09/2022   Lab Results  Component Value Date   HGBA1C 5.9 (H) 08/03/2022   HGBA1C 5.5 03/09/2022   HGBA1C 5.8 (A) 12/18/2021   HGBA1C 5.8 (A) 06/20/2021   HGBA1C 6.1 (H) 05/13/2021   Lab Results  Component Value Date   INSULIN 9.7 05/13/2021   INSULIN 4.0 11/14/2020   Lab Results  Component Value Date   TSH 0.724 11/14/2020   Lab Results  Component Value Date   CHOL 108 03/09/2022   HDL 62 03/09/2022  LDLCALC 37 03/09/2022   TRIG 27 03/09/2022   CHOLHDL 1.7 03/09/2022   Lab Results  Component Value Date   VD25OH 40.5 11/14/2020   VD25OH 35 08/14/2011   Lab Results  Component Value Date   WBC 8.0 03/09/2022   HGB 13.6 03/09/2022   HCT 40.1 03/09/2022   MCV 91 03/09/2022   PLT 271 03/09/2022   No results found for: "IRON", "TIBC", "FERRITIN"  Attestation Statements:   Reviewed by clinician on day of visit: allergies, medications, problem list, medical history, surgical history,  family history, social history, and previous encounter notes.  I, Davy Pique, RMA, am acting as Location manager for Mina Marble, NP.  I have reviewed the above documentation for accuracy and completeness, and I agree with the above. -  Santosh Petter d. Leiam Hopwood, NP-C

## 2022-08-04 NOTE — Progress Notes (Signed)
[facility] TEST REPORT ( by Watch PAT)   STUDY DATE:  08-04-2022 , mail- out device DOB:  [birth year]    ORDERING CLINICIAN:Megan Clabe Seal, NP at [facility] -  Dr Jaynee Eagles  REFERRING CLINICIAN: Feliz Beam, PA CLINICAL INFORMATION/HISTORY: MM10/02/23:   Ms. Laurie Mckinney is a 61 year old female with a history of obstructive sleep apnea on CPAP and peripheral neuropathy.  She returns today for follow-up.  She does use the CPAP nightly.  However she does feel that she is sleeping lighter and is more aware of the CPAP.  She does find it annoying.  She does state that she has lost weight since her original study.  She also feels that she is in better health since her original study.  Possibly interested in inspire.     Epworth sleepiness score: XX/24.   BMI: 29.6 kg/m   Neck Circumference: XX   FINDINGS:   Sleep Summary:   Total Recording Time (hours, min):8 h 27 m        Total Sleep Time (hours, min):     7 h 50 m            Percent REM (%):      31.2%                                  Respiratory Indices:   Calculated pAHI (per hour): 37.5/h                            REM pAHI:     45.7/h                                            NREM pAHI:     33.8/h                         Supine AHI:   38/h                                                Oxygen Saturation Statistics:    O2 Saturation Range (%):      between 86% and 98%                                  O2 Saturation (minutes) <89%:    1.2 m       Pulse Rate Statistics:   Pulse Mean (bpm):  59 bpm              Pulse Range:      33 through 82 bpm .            IMPRESSION:  This HST confirms the presence of severe obstructive sleep apnea (OSA),  associated with bradycardic events. REM sleep accentuated apnea but not REM sleep dependent. The apnea was not positional dependent. No clinically significant degree of  hypoxia was associated.    RECOMMENDATION:  This type of apnea is possibly  treatable by an inspire device.   Please advise the  patient that inspire reduces apnea on average by 70-80%    INTERPRETING PHYSICIAN:   Larey Seat, MD   Medical Director of [facility] at [facility].

## 2022-08-09 DIAGNOSIS — G4733 Obstructive sleep apnea (adult) (pediatric): Secondary | ICD-10-CM | POA: Insufficient documentation

## 2022-08-09 NOTE — Procedures (Signed)
[facility] TEST REPORT ( by Watch PAT)   STUDY DATE:  08-04-2022 , mail- out device DOB:  02-May-1961    ORDERING CLINICIAN:Megan Clabe Seal, NP at [facility] -  Dr Jaynee Eagles  REFERRING CLINICIAN: Feliz Beam, PA CLINICAL INFORMATION/HISTORY: MM10/02/23:   [name] is a 61 year old female with a history of obstructive sleep apnea on CPAP and peripheral neuropathy.  She returns today for follow-up.  She does use the CPAP nightly.  However she does feel that she is sleeping lighter and is more aware of the CPAP.  She does find it annoying.  She does state that she has lost weight since her original study.  She also feels that she is in better health since her original study.  Possibly interested in inspire.     Epworth sleepiness score: XX/24.   BMI: 29.6 kg/m   Neck Circumference: XX   FINDINGS:   Sleep Summary:   Total Recording Time (hours, min):8 h 27 m        Total Sleep Time (hours, min):     7 h 50 m            Percent REM (%):      31.2%                                  Respiratory Indices:   Calculated pAHI (per hour): 37.5/h                            REM pAHI:     45.7/h                                            NREM pAHI:     33.8/h                         Supine AHI:   38/h                                                Oxygen Saturation Statistics:    O2 Saturation Range (%):      between 86% and 98%                                  O2 Saturation (minutes) <89%:    1.2 m       Pulse Rate Statistics:   Pulse Mean (bpm):  59 bpm              Pulse Range:      33 through 82 bpm .            IMPRESSION:  This HST confirms the presence of severe obstructive sleep apnea (OSA),  associated with bradycardic events. REM sleep accentuated apnea but not REM sleep dependent. The apnea was not positional dependent. No clinically significant degree of  hypoxia was associated.    RECOMMENDATION:  This type of apnea is possibly treatable by an  inspire device.   Please advise the patient that inspire reduces  apnea on average by 70-80%    INTERPRETING PHYSICIAN:   Larey Seat, MD   Medical Director of [facility] at [facility].

## 2022-08-10 ENCOUNTER — Telehealth: Payer: Self-pay | Admitting: *Deleted

## 2022-08-10 DIAGNOSIS — G4733 Obstructive sleep apnea (adult) (pediatric): Secondary | ICD-10-CM

## 2022-08-10 NOTE — Telephone Encounter (Signed)
-----   Message from Ward Givens, NP sent at 08/10/2022  3:08 PM EST ----- Home sleep test that shows severe obstructive sleep apnea.  Dr. Brett Fairy feels that this apnea is possibly treated by inspire device.  Inspire reduce his apnea on average by 70 to 80%.  Please let me know if patient is interested

## 2022-08-10 NOTE — Telephone Encounter (Signed)
Spoke with patient Pt wants to move forward with inspire device will forward to megan,NP to place order

## 2022-08-11 ENCOUNTER — Telehealth: Payer: Self-pay | Admitting: Adult Health

## 2022-08-11 NOTE — Telephone Encounter (Signed)
Referral sent to [facility], phone # [phone].

## 2022-08-31 NOTE — Telephone Encounter (Signed)
Patient was notified of results already. See phone note.

## 2022-10-07 ENCOUNTER — Ambulatory Visit (INDEPENDENT_AMBULATORY_CARE_PROVIDER_SITE_OTHER): Payer: BC Managed Care – PPO | Admitting: Adult Health

## 2022-10-14 ENCOUNTER — Telehealth: Payer: Self-pay

## 2022-10-14 NOTE — Telephone Encounter (Signed)
Called patient, LMVM 2 appeal was approved. She be receiving a phone call from [facility], our surgery scheduler within the next couple of days.   Chart on Heather's desk.

## 2022-10-15 ENCOUNTER — Telehealth: Payer: Self-pay | Admitting: *Deleted

## 2022-10-15 NOTE — Telephone Encounter (Signed)
Spoke with patient to schedule surgery. She asked for 24 hours to look into costs and process.

## 2022-10-20 ENCOUNTER — Telehealth: Payer: Self-pay | Admitting: *Deleted

## 2022-10-20 NOTE — Telephone Encounter (Signed)
Spoke with patient to schedule sx and all related appts

## 2022-10-21 ENCOUNTER — Other Ambulatory Visit: Payer: Self-pay | Admitting: Family Medicine

## 2022-10-23 ENCOUNTER — Telehealth: Payer: Self-pay | Admitting: *Deleted

## 2022-10-23 ENCOUNTER — Telehealth: Payer: Self-pay

## 2022-10-23 ENCOUNTER — Ambulatory Visit (INDEPENDENT_AMBULATORY_CARE_PROVIDER_SITE_OTHER): Payer: BC Managed Care – PPO | Admitting: Student

## 2022-10-23 VITALS — BP 130/84 | HR 82 | Temp 98.4°F | Resp 18 | Ht 71.0 in | Wt 215.0 lb

## 2022-10-23 DIAGNOSIS — N62 Hypertrophy of breast: Secondary | ICD-10-CM

## 2022-10-23 MED ORDER — ONDANSETRON HCL 4 MG PO TABS: 4.0000 mg | ORAL_TABLET | Freq: Three times a day (TID) | ORAL | 0 refills | Status: DC | PRN

## 2022-10-23 MED ORDER — OXYCODONE HCL 5 MG PO TABS: 5.0000 mg | ORAL_TABLET | Freq: Three times a day (TID) | ORAL | 0 refills | Status: DC | PRN

## 2022-10-23 MED ORDER — CEPHALEXIN 500 MG PO CAPS: 500.0000 mg | ORAL_CAPSULE | Freq: Four times a day (QID) | ORAL | 0 refills | Status: AC

## 2022-10-23 NOTE — Telephone Encounter (Signed)
   Pre-operative Risk Assessment    Patient Name: Laurie Mckinney  DOB: 01/04/1961 MRN: [id number]      Request for Surgical Clearance    Procedure:   BL breast reduction with liposuction   Date of Surgery:  Clearance 11/19/22                                  Surgeon:  Dr. Audelia Hives  Surgeon's Group or Practice Name:  [facility] Phone number:  [phone] Fax number:  [phone]   Type of Clearance Requested:   - Medical  - Pharmacy:  Hold Rivaroxaban (Xarelto) 3 days prior to surgery   Type of Anesthesia:  General    Additional requests/questions:   Are there any medications the patient MUST take the morning of surgery?  Cathlean Cower   10/23/2022, 4:31 PM

## 2022-10-23 NOTE — Telephone Encounter (Signed)
Faxed SX clearance to Dr. Carlyle Dolly to hold Xarelto 3 days prior to surgery and PCP Jill Alexanders

## 2022-10-23 NOTE — H&P (View-Only) (Signed)
Patient ID: Laurie Mckinney, female    DOB: 1961/04/01, 62 y.o.   MRN: [id number]  Chief Complaint  Patient presents with   Pre-op Exam      ICD-10-CM   1. Symptomatic mammary hypertrophy  N62        History of Present Illness: Laurie Mckinney is a 62 y.o.  female  with a history of macromastia.  She presents for preoperative evaluation for upcoming procedure, Bilateral Breast Reduction with liposuction, scheduled for 11/19/2022 with Dr.  Marla Roe  The patient has not had problems with anesthesia.  Patient denies any personal or family history of breast cancer.  She reports she had a mammogram back in June which was negative.  Patient reports that she has A-fib and is on Xarelto for that.  She states that she sees Dr. Carlyle Dolly cardiologist for this.  Patient denies being a smoker.  Patient denies taking any birth control or hormone replacement.  She reports history of 1 miscarriage.  She denies any personal or family history of blood clots or clotting diseases.  She denies any recent surgeries, traumas, infections.  She denies any history of stroke or heart attack.  She denies any history of Crohn's disease or ulcerative colitis.  She denies any history of COPD or asthma.  Patient reports she has had uterine cancer and has had a hysterectomy.  She states she is now cancer free.  Patient reports she has varicose veins.  She denies any swelling in her legs.  She denies any recent fevers, chills or changes in her health.  Patient reports she is currently a DDD cup.  She states that she would just like to be smaller than a DDD cup.  I discussed the limitations of how much tissue can be removed during surgery with the patient.  Patient expressed understanding.  Summary of Previous Visit: Patient was seen by Dr. Marla Roe on 12/02/2021.  At this visit, patient reported back pain and neck pain.  She reported that her bra straps were causing grooving to her shoulders.  Patient also  reported that activities such as exercise and running were hindered by her enlarged breast.  On exam, her STN was found to be 38 cm on the right and 39 cm on the left.  Her BMI was found to be 31.2 kg/m.  Her preoperative bra size was a DDD cup.  Patient reported she would like to be a full C/D cup.  Bilateral breast reduction with liposuction was recommended to the patient.  Patient also reported she was on Xarelto for A-fib and was told that she would need PCP clearance given that she is on the Xarelto.  Estimated excess breast tissue to be removed at time of surgery: 750 grams bilaterally  Job: Retired  [place] Significant for: Hypertension, second-degree heart block, A-fib, diabetes, OSA wears CPAP at night  Patient reports her most recent A1c was back in October which was 5.9.   Past Medical History: Allergies: No Known Allergies  Current Medications:  Current Outpatient Medications:    acetaminophen (TYLENOL) 650 MG CR tablet, Take 650 mg by mouth every 8 (eight) hours as needed for pain., Disp: , Rfl:    atorvastatin (LIPITOR) 10 MG tablet, TAKE 1 TABLET DAILY, Disp: 90 tablet, Rfl: 2   buPROPion (WELLBUTRIN SR) 200 MG 12 hr tablet, TAKE 1 TABLET BY MOUTH EVERY DAY, Disp: 90 tablet, Rfl: 1   Lancets (ONETOUCH DELICA PLUS 123XX123) MISC, USE AND DISCARD 1 LANCET  DAILY, Disp: 100 each, Rfl: 3   lisinopril-hydrochlorothiazide (ZESTORETIC) 20-25 MG tablet, TAKE 1 TABLET DAILY (DOSE  INCREASE), Disp: 90 tablet, Rfl: 1   Misc Natural Products (IMMUNE FORMULA PO), Take 2 tablets by mouth daily. immuneti, Disp: , Rfl:    Multiple Vitamins-Minerals (CENTRUM SILVER 50+WOMEN) TABS, Take 1 tablet by mouth daily., Disp: , Rfl:    ONETOUCH VERIO test strip, USE AND DISCARD 1 TEST     STRIP AS NEEDED AS         INSTRUCTED, Disp: 100 each, Rfl: 3   oxybutynin (DITROPAN-XL) 10 MG 24 hr tablet, TAKE 1 TABLET AT BEDTIME, Disp: 90 tablet, Rfl: 1   PARoxetine (PAXIL) 20 MG tablet, TAKE 1 TABLET BY MOUTH  EVERY DAY, Disp: 90 tablet, Rfl: 1   rivaroxaban (XARELTO) 20 MG TABS tablet, TAKE 1 TABLET BY MOUTH DAILY WITH SUPPER, Disp: 90 tablet, Rfl: 1   Semaglutide, 1 MG/DOSE, 4 MG/3ML SOPN, Inject 1 mg as directed once a week., Disp: 9 mL, Rfl: 0   UNABLE TO FIND, Take 1 tablet by mouth Nightly. Med Name: Theotis Burrow, Disp: , Rfl:    mupirocin ointment (BACTROBAN) 2 %, Apply topically 2 (two) times daily. (Patient taking differently: Apply topically as needed.), Disp: 15 g, Rfl: 0   Vibegron (GEMTESA) 75 MG TABS, Take by mouth at bedtime., Disp: , Rfl:   Past Medical Problems: Past Medical History:  Diagnosis Date   Adjustment insomnia    SHIFT WORK   Allergy    RHINITIS   Asthma    Atrial flutter ([place]) 02/26/15   [facility]   Back pain    Bursitis    right hip   Cataract    see last eye eexam note    Constipation    Depression    Diabetes mellitus without complication (HCC)    History of atrial fibrillation    History of cancer    Hypertension    Incontinence    Joint pain    Migraine headache    Mobitz type 2 second degree heart block    Neuropathy    Obesity    Other fatigue    Shortness of breath on exertion    Sleep apnea     Past Surgical History: Past Surgical History:  Procedure Laterality Date   ABDOMINAL HYSTERECTOMY     ATRIAL FIBRILLATION ABLATION N/A 10/27/2019   Procedure: ATRIAL FIBRILLATION ABLATION;  Surgeon: Constance Haw, MD;  Location: [place] [facility];  Service: Cardiovascular;  Laterality: N/A;   CARDIOVERSION N/A 05/25/2019   Procedure: CARDIOVERSION;  Surgeon: Arnoldo Lenis, MD;  Location: [facility];  Service: Endoscopy;  Laterality: N/A;   COLONOSCOPY  2011   Dr.Brodie   ROBOTIC ASSISTED TOTAL HYSTERECTOMY WITH BILATERAL SALPINGO OOPHERECTOMY Bilateral 02/05/2015   Procedure: ROBOTIC ASSISTED TOTAL LAPAROSCOPIC HYSTERECTOMY WITH BILATERAL SALPINGO OOPHORECTOMY;  Surgeon: Everitt Amber, MD;  Location: [facility];  Service: Gynecology;   Laterality: Bilateral;    Social History: Social History   Socioeconomic History   Marital status: Married    Spouse name: Shavelle Tep   Number of children: 2   Years of education: Not on file   Highest education level: Not on file  Occupational History   Occupation: Conservation officer, historic buildings for parent  Tobacco Use   Smoking status: Never   Smokeless tobacco: Never  Vaping Use   Vaping Use: Never used  Substance and Sexual Activity   Alcohol use: Yes    Alcohol/week: 1.0  standard drink of alcohol    Types: 1 Standard drinks or equivalent per week   Drug use: No   Sexual activity: Yes    Partners: Male    Comment: Has had hysterectomy.  Other Topics Concern   Not on file  Social History Narrative   Lives at home with husband   Right handed   Caffeine: 1 cup of coffee in the morning and <20 oz of soda per day   Social Determinants of Health   Financial Resource Strain: Not on file  Food Insecurity: Not on file  Transportation Needs: Not on file  Physical Activity: Not on file  Stress: Not on file  Social Connections: Not on file  Intimate Partner Violence: Not on file    Family History: Family History  Problem Relation Age of Onset   Arthritis Mother    Heart disease Father    Hypertension Father    Hyperlipidemia Father    Sudden death Father    Neuropathy Brother        both legs    Diabetes Brother        no treatment that the pt knows of    Other Brother        drank a 6 pack of beer daily x 30 years    Sleep apnea Neg Hx     Review of Systems: Denies any recent fevers, chills or changes in her health  Physical Exam: Vital Signs BP 130/84 (BP Location: Left Arm, Patient Position: Sitting, Cuff Size: Large)   Pulse 82   Temp 98.4 F (36.9 C) (Oral)   Resp 18   Ht 5' 11"$  (1.803 m)   Wt 215 lb (97.5 kg)   LMP 01/26/2011   SpO2 97%   BMI 29.99 kg/m   Physical Exam  Constitutional:      General: Not in acute distress.    Appearance: Normal  appearance. Not ill-appearing.  HENT:     Head: Normocephalic and atraumatic.  Neck:     Musculoskeletal: Normal range of motion.  Cardiovascular:     Rate and Rhythm: Normal rate Pulmonary:     Effort: Pulmonary effort is normal. No respiratory distress.  Musculoskeletal: Normal range of motion.  Lower extremities: Varicose veins noted bilaterally, lower extremities nonswollen Skin:    General: Skin is warm and dry.  Neurological:      Mental Status: Alert and oriented to person, place, and time. Mental status is at baseline.  Psychiatric:        Mood and Affect: Mood normal.        Behavior: Behavior normal.    Assessment/Plan: The patient is scheduled for bilateral breast reduction with Dr. Marla Roe.  Risks, benefits, and alternatives of procedure discussed, questions answered and consent obtained.    Smoking Status: Non-smoker; Counseling Given?  N/A Last Mammogram: 03/13/22; Results: BIRADS Category 1: negative  Caprini Score: 6; Risk Factors include: Age, BMI > 25, varicose veins, history of cancer, and length of planned surgery. Recommendation for mechanical pharmacological prophylaxis. Encourage early ambulation.   Will send clearances to patient's PCP and patient's cardiologist.  Will request to hold Xarelto for 3 days prior to surgery from patient's cardiologist.  Pictures obtained: @consult$   Post-op Rx sent to pharmacy: Oxycodone, Zofran, Keflex  I instructed the patient to hold her lisinopril-hydrochlorothiazide the day of surgery, and I instructed her to hold her multivitamins and dietary supplements at least 1 week prior to surgery.  We will plan to hold Xarelto  3 days prior to surgery, but I discussed with the patient that I will be getting clearance to hold it from her cardiologist.  Patient expressed understanding.  Patient was provided with the breast reduction and General Surgical Risk consent document and Pain Medication Agreement prior to their appointment.   They had adequate time to read through the risk consent documents and Pain Medication Agreement. We also discussed them in person together during this preop appointment. All of their questions were answered to their satisfaction.  Recommended calling if they have any further questions.  Risk consent form and Pain Medication Agreement to be scanned into patient's chart.  The risk that can be encountered with breast reduction were discussed and include the following but not limited to these:  Breast asymmetry, fluid accumulation, firmness of the breast, inability to breast feed, loss of nipple or areola, skin loss, decrease or no nipple sensation, fat necrosis of the breast tissue, bleeding, infection, healing delay.  There are risks of anesthesia, changes to skin sensation and injury to nerves or blood vessels.  The muscle can be temporarily or permanently injured.  You may have an allergic reaction to tape, suture, glue, blood products which can result in skin discoloration, swelling, pain, skin lesions, poor healing.  Any of these can lead to the need for revisonal surgery or stage procedures.  A reduction has potential to interfere with diagnostic procedures.  Nipple or breast piercing can increase risks of infection.  This procedure is best done when the breast is fully developed.  Changes in the breast will continue to occur over time.  Pregnancy can alter the outcomes of previous breast reduction surgery, weight gain and weigh loss can also effect the long term appearance.     Electronically signed by: Clance Boll, PA-C 10/23/2022 10:50 AM

## 2022-10-23 NOTE — Progress Notes (Signed)
   Patient ID: Laurie Mckinney, female    DOB: 01/22/1961, 61 y.o.   MRN: [id number]  Chief Complaint  Patient presents with   Pre-op Exam      ICD-10-CM   1. Symptomatic mammary hypertrophy  N62        History of Present Illness: Laurie Mckinney is a 61 y.o.  female  with a history of macromastia.  She presents for preoperative evaluation for upcoming procedure, Bilateral Breast Reduction with liposuction, scheduled for 11/19/2022 with Dr.  Dillingham  The patient has not had problems with anesthesia.  Patient denies any personal or family history of breast cancer.  She reports she had a mammogram back in June which was negative.  Patient reports that she has A-fib and is on Xarelto for that.  She states that she sees Dr. Jonathan Branch cardiologist for this.  Patient denies being a smoker.  Patient denies taking any birth control or hormone replacement.  She reports history of 1 miscarriage.  She denies any personal or family history of blood clots or clotting diseases.  She denies any recent surgeries, traumas, infections.  She denies any history of stroke or heart attack.  She denies any history of Crohn's disease or ulcerative colitis.  She denies any history of COPD or asthma.  Patient reports she has had uterine cancer and has had a hysterectomy.  She states she is now cancer free.  Patient reports she has varicose veins.  She denies any swelling in her legs.  She denies any recent fevers, chills or changes in her health.  Patient reports she is currently a DDD cup.  She states that she would just like to be smaller than a DDD cup.  I discussed the limitations of how much tissue can be removed during surgery with the patient.  Patient expressed understanding.  Summary of Previous Visit: Patient was seen by Dr. Dillingham on 12/02/2021.  At this visit, patient reported back pain and neck pain.  She reported that her bra straps were causing grooving to her shoulders.  Patient also  reported that activities such as exercise and running were hindered by her enlarged breast.  On exam, her STN was found to be 38 cm on the right and 39 cm on the left.  Her BMI was found to be 31.2 kg/m.  Her preoperative bra size was a DDD cup.  Patient reported she would like to be a full C/D cup.  Bilateral breast reduction with liposuction was recommended to the patient.  Patient also reported she was on Xarelto for A-fib and was told that she would need PCP clearance given that she is on the Xarelto.  Estimated excess breast tissue to be removed at time of surgery: 750 grams bilaterally  Job: Retired  PMH Significant for: Hypertension, second-degree heart block, A-fib, diabetes, OSA wears CPAP at night  Patient reports her most recent A1c was back in October which was 5.9.   Past Medical History: Allergies: No Known Allergies  Current Medications:  Current Outpatient Medications:    acetaminophen (TYLENOL) 650 MG CR tablet, Take 650 mg by mouth every 8 (eight) hours as needed for pain., Disp: , Rfl:    atorvastatin (LIPITOR) 10 MG tablet, TAKE 1 TABLET DAILY, Disp: 90 tablet, Rfl: 2   buPROPion (WELLBUTRIN SR) 200 MG 12 hr tablet, TAKE 1 TABLET BY MOUTH EVERY DAY, Disp: 90 tablet, Rfl: 1   Lancets (ONETOUCH DELICA PLUS LANCET33G) MISC, USE AND DISCARD 1 LANCET     DAILY, Disp: 100 each, Rfl: 3   lisinopril-hydrochlorothiazide (ZESTORETIC) 20-25 MG tablet, TAKE 1 TABLET DAILY (DOSE  INCREASE), Disp: 90 tablet, Rfl: 1   Misc Natural Products (IMMUNE FORMULA PO), Take 2 tablets by mouth daily. immuneti, Disp: , Rfl:    Multiple Vitamins-Minerals (CENTRUM SILVER 50+WOMEN) TABS, Take 1 tablet by mouth daily., Disp: , Rfl:    ONETOUCH VERIO test strip, USE AND DISCARD 1 TEST     STRIP AS NEEDED AS         INSTRUCTED, Disp: 100 each, Rfl: 3   oxybutynin (DITROPAN-XL) 10 MG 24 hr tablet, TAKE 1 TABLET AT BEDTIME, Disp: 90 tablet, Rfl: 1   PARoxetine (PAXIL) 20 MG tablet, TAKE 1 TABLET BY MOUTH  EVERY DAY, Disp: 90 tablet, Rfl: 1   rivaroxaban (XARELTO) 20 MG TABS tablet, TAKE 1 TABLET BY MOUTH DAILY WITH SUPPER, Disp: 90 tablet, Rfl: 1   Semaglutide, 1 MG/DOSE, 4 MG/3ML SOPN, Inject 1 mg as directed once a week., Disp: 9 mL, Rfl: 0   UNABLE TO FIND, Take 1 tablet by mouth Nightly. Med Name: Nervive, Disp: , Rfl:    mupirocin ointment (BACTROBAN) 2 %, Apply topically 2 (two) times daily. (Patient taking differently: Apply topically as needed.), Disp: 15 g, Rfl: 0   Vibegron (GEMTESA) 75 MG TABS, Take by mouth at bedtime., Disp: , Rfl:   Past Medical Problems: Past Medical History:  Diagnosis Date   Adjustment insomnia    SHIFT WORK   Allergy    RHINITIS   Asthma    Atrial flutter (HCC) 02/26/15   [facility]   Back pain    Bursitis    right hip   Cataract    see last eye eexam note    Constipation    Depression    Diabetes mellitus without complication (HCC)    History of atrial fibrillation    History of cancer    Hypertension    Incontinence    Joint pain    Migraine headache    Mobitz type 2 second degree heart block    Neuropathy    Obesity    Other fatigue    Shortness of breath on exertion    Sleep apnea     Past Surgical History: Past Surgical History:  Procedure Laterality Date   ABDOMINAL HYSTERECTOMY     ATRIAL FIBRILLATION ABLATION N/A 10/27/2019   Procedure: ATRIAL FIBRILLATION ABLATION;  Surgeon: Camnitz, Will Martin, MD;  Location: [facility];  Service: Cardiovascular;  Laterality: N/A;   CARDIOVERSION N/A 05/25/2019   Procedure: CARDIOVERSION;  Surgeon: Branch, Jonathan F, MD;  Location: [facility];  Service: Endoscopy;  Laterality: N/A;   COLONOSCOPY  2011   Dr.Brodie   ROBOTIC ASSISTED TOTAL HYSTERECTOMY WITH BILATERAL SALPINGO OOPHERECTOMY Bilateral 02/05/2015   Procedure: ROBOTIC ASSISTED TOTAL LAPAROSCOPIC HYSTERECTOMY WITH BILATERAL SALPINGO OOPHORECTOMY;  Surgeon: Emma Rossi, MD;  Location: [facility];  Service: Gynecology;   Laterality: Bilateral;    Social History: Social History   Socioeconomic History   Marital status: Married    Spouse name: Mark Gallardo   Number of children: 2   Years of education: Not on file   Highest education level: Not on file  Occupational History   Occupation: Retired/ Caretaker for parent  Tobacco Use   Smoking status: Never   Smokeless tobacco: Never  Vaping Use   Vaping Use: Never used  Substance and Sexual Activity   Alcohol use: Yes    Alcohol/week: 1.0   standard drink of alcohol    Types: 1 Standard drinks or equivalent per week   Drug use: No   Sexual activity: Yes    Partners: Male    Comment: Has had hysterectomy.  Other Topics Concern   Not on file  Social History Narrative   Lives at home with husband   Right handed   Caffeine: 1 cup of coffee in the morning and <20 oz of soda per day   Social Determinants of Health   Financial Resource Strain: Not on file  Food Insecurity: Not on file  Transportation Needs: Not on file  Physical Activity: Not on file  Stress: Not on file  Social Connections: Not on file  Intimate Partner Violence: Not on file    Family History: Family History  Problem Relation Age of Onset   Arthritis Mother    Heart disease Father    Hypertension Father    Hyperlipidemia Father    Sudden death Father    Neuropathy Brother        both legs    Diabetes Brother        no treatment that the pt knows of    Other Brother        drank a 6 pack of beer daily x 30 years    Sleep apnea Neg Hx     Review of Systems: Denies any recent fevers, chills or changes in her health  Physical Exam: Vital Signs BP 130/84 (BP Location: Left Arm, Patient Position: Sitting, Cuff Size: Large)   Pulse 82   Temp 98.4 F (36.9 C) (Oral)   Resp 18   Ht 5' 11" (1.803 m)   Wt 215 lb (97.5 kg)   LMP 01/26/2011   SpO2 97%   BMI 29.99 kg/m   Physical Exam  Constitutional:      General: Not in acute distress.    Appearance: Normal  appearance. Not ill-appearing.  HENT:     Head: Normocephalic and atraumatic.  Neck:     Musculoskeletal: Normal range of motion.  Cardiovascular:     Rate and Rhythm: Normal rate Pulmonary:     Effort: Pulmonary effort is normal. No respiratory distress.  Musculoskeletal: Normal range of motion.  Lower extremities: Varicose veins noted bilaterally, lower extremities nonswollen Skin:    General: Skin is warm and dry.  Neurological:      Mental Status: Alert and oriented to person, place, and time. Mental status is at baseline.  Psychiatric:        Mood and Affect: Mood normal.        Behavior: Behavior normal.    Assessment/Plan: The patient is scheduled for bilateral breast reduction with Dr. Dillingham.  Risks, benefits, and alternatives of procedure discussed, questions answered and consent obtained.    Smoking Status: Non-smoker; Counseling Given?  N/A Last Mammogram: 03/13/22; Results: BIRADS Category 1: negative  Caprini Score: 6; Risk Factors include: Age, BMI > 25, varicose veins, history of cancer, and length of planned surgery. Recommendation for mechanical pharmacological prophylaxis. Encourage early ambulation.   Will send clearances to patient's PCP and patient's cardiologist.  Will request to hold Xarelto for 3 days prior to surgery from patient's cardiologist.  Pictures obtained: @consult  Post-op Rx sent to pharmacy: Oxycodone, Zofran, Keflex  I instructed the patient to hold her lisinopril-hydrochlorothiazide the day of surgery, and I instructed her to hold her multivitamins and dietary supplements at least 1 week prior to surgery.  We will plan to hold Xarelto   3 days prior to surgery, but I discussed with the patient that I will be getting clearance to hold it from her cardiologist.  Patient expressed understanding.  Patient was provided with the breast reduction and General Surgical Risk consent document and Pain Medication Agreement prior to their appointment.   They had adequate time to read through the risk consent documents and Pain Medication Agreement. We also discussed them in person together during this preop appointment. All of their questions were answered to their satisfaction.  Recommended calling if they have any further questions.  Risk consent form and Pain Medication Agreement to be scanned into patient's chart.  The risk that can be encountered with breast reduction were discussed and include the following but not limited to these:  Breast asymmetry, fluid accumulation, firmness of the breast, inability to breast feed, loss of nipple or areola, skin loss, decrease or no nipple sensation, fat necrosis of the breast tissue, bleeding, infection, healing delay.  There are risks of anesthesia, changes to skin sensation and injury to nerves or blood vessels.  The muscle can be temporarily or permanently injured.  You may have an allergic reaction to tape, suture, glue, blood products which can result in skin discoloration, swelling, pain, skin lesions, poor healing.  Any of these can lead to the need for revisonal surgery or stage procedures.  A reduction has potential to interfere with diagnostic procedures.  Nipple or breast piercing can increase risks of infection.  This procedure is best done when the breast is fully developed.  Changes in the breast will continue to occur over time.  Pregnancy can alter the outcomes of previous breast reduction surgery, weight gain and weigh loss can also effect the long term appearance.     Electronically signed by: Niah Heinle E Zanita Millman, PA-C 10/23/2022 10:50 AM  

## 2022-10-26 ENCOUNTER — Other Ambulatory Visit (INDEPENDENT_AMBULATORY_CARE_PROVIDER_SITE_OTHER): Payer: Self-pay | Admitting: Adult Health

## 2022-10-26 ENCOUNTER — Telehealth: Payer: Self-pay | Admitting: *Deleted

## 2022-10-26 ENCOUNTER — Encounter (INDEPENDENT_AMBULATORY_CARE_PROVIDER_SITE_OTHER): Payer: Self-pay | Admitting: Adult Health

## 2022-10-26 ENCOUNTER — Ambulatory Visit (INDEPENDENT_AMBULATORY_CARE_PROVIDER_SITE_OTHER): Payer: BC Managed Care – PPO | Admitting: Adult Health

## 2022-10-26 VITALS — BP 122/77 | HR 62 | Temp 97.7°F | Ht 71.0 in | Wt 210.0 lb

## 2022-10-26 DIAGNOSIS — E1169 Type 2 diabetes mellitus with other specified complication: Secondary | ICD-10-CM

## 2022-10-26 DIAGNOSIS — N6489 Other specified disorders of breast: Secondary | ICD-10-CM

## 2022-10-26 DIAGNOSIS — E669 Obesity, unspecified: Secondary | ICD-10-CM | POA: Diagnosis not present

## 2022-10-26 DIAGNOSIS — K5903 Drug induced constipation: Secondary | ICD-10-CM

## 2022-10-26 DIAGNOSIS — Z6829 Body mass index (BMI) 29.0-29.9, adult: Secondary | ICD-10-CM

## 2022-10-26 DIAGNOSIS — Z7985 Long-term (current) use of injectable non-insulin antidiabetic drugs: Secondary | ICD-10-CM

## 2022-10-26 MED ORDER — SEMAGLUTIDE (1 MG/DOSE) 4 MG/3ML ~~LOC~~ SOPN: 1.0000 mg | PEN_INJECTOR | SUBCUTANEOUS | 0 refills | Status: DC

## 2022-10-26 NOTE — Telephone Encounter (Signed)
Patient with diagnosis of afib on Xarelto for anticoagulation.    Procedure:  BL breast reduction with liposuction   Date of procedure: 11/19/22   CHA2DS2-VASc Score = 3   This indicates a 3.2% annual risk of stroke. The patient's score is based upon: CHF History: 0 HTN History: 1 Diabetes History: 1 Stroke History: 0 Vascular Disease History: 0 Age Score: 0 Gender Score: 1      CrCl 94 ml/min  Per office protocol, patient can hold Xarelto for 3 days prior to procedure.    **This guidance is not considered finalized until pre-operative APP has relayed final recommendations.**

## 2022-10-26 NOTE — Telephone Encounter (Signed)
Pt has been scheduled a tele visit, 10/28/22 9:20.  Consent on file / medications reconciled.    Patient Consent for Virtual Visit        Laurie Mckinney has provided verbal consent on 10/26/2022 for a virtual visit (video or telephone).   CONSENT FOR VIRTUAL VISIT FOR:  Laurie Mckinney  By participating in this virtual visit I agree to the following:  I hereby voluntarily request, consent and authorize [facility] and its employed or contracted physicians, physician assistants, nurse practitioners or other licensed health care professionals (the Practitioner), to provide me with telemedicine health care services (the "Services") as deemed necessary by the treating Practitioner. I acknowledge and consent to receive the Services by the Practitioner via telemedicine. I understand that the telemedicine visit will involve communicating with the Practitioner through live audiovisual communication technology and the disclosure of certain medical information by electronic transmission. I acknowledge that I have been given the opportunity to request an in-person assessment or other available alternative prior to the telemedicine visit and am voluntarily participating in the telemedicine visit.  I understand that I have the right to withhold or withdraw my consent to the use of telemedicine in the course of my care at any time, without affecting my right to future care or treatment, and that the Practitioner or I may terminate the telemedicine visit at any time. I understand that I have the right to inspect all information obtained and/or recorded in the course of the telemedicine visit and may receive copies of available information for a reasonable fee.  I understand that some of the potential risks of receiving the Services via telemedicine include:  Delay or interruption in medical evaluation due to technological equipment failure or disruption; Information transmitted may not be sufficient  (e.g. poor resolution of images) to allow for appropriate medical decision making by the Practitioner; and/or  In rare instances, security protocols could fail, causing a breach of personal health information.  Furthermore, I acknowledge that it is my responsibility to provide information about my medical history, conditions and care that is complete and accurate to the best of my ability. I acknowledge that Practitioner's advice, recommendations, and/or decision may be based on factors not within their control, such as incomplete or inaccurate data provided by me or distortions of diagnostic images or specimens that may result from electronic transmissions. I understand that the practice of medicine is not an exact science and that Practitioner makes no warranties or guarantees regarding treatment outcomes. I acknowledge that a copy of this consent can be made available to me via my patient portal ([facility]), or I can request a printed copy by calling the office of [facility].    I understand that my insurance will be billed for this visit.   I have read or had this consent read to me. I understand the contents of this consent, which adequately explains the benefits and risks of the Services being provided via telemedicine.  I have been provided ample opportunity to ask questions regarding this consent and the Services and have had my questions answered to my satisfaction. I give my informed consent for the services to be provided through the use of telemedicine in my medical care

## 2022-10-26 NOTE — Telephone Encounter (Signed)
   Name: Laurie Mckinney  DOB: Feb 24, 1961  MRN: [id number]  Primary Cardiologist: Carlyle Dolly, MD   Preoperative team, please contact this patient and set up a phone call appointment for further preoperative risk assessment. Please obtain consent and complete medication review. Thank you for your help.  I confirm that guidance regarding antiplatelet and oral anticoagulation therapy has been completed and, if necessary, noted below.  Per office protocol, patient can hold Xarelto for 3 days prior to procedure.     [name], NP 10/26/2022, 12:07 PM [place]

## 2022-10-26 NOTE — Telephone Encounter (Signed)
Pt has been scheduled a tele visit, 10/28/22 9:20.  Consent on file / medications reconciled

## 2022-10-28 ENCOUNTER — Other Ambulatory Visit: Payer: Self-pay | Admitting: Student

## 2022-10-28 ENCOUNTER — Telehealth: Payer: Self-pay | Admitting: *Deleted

## 2022-10-28 ENCOUNTER — Encounter: Payer: Self-pay | Admitting: Student

## 2022-10-28 ENCOUNTER — Ambulatory Visit: Payer: BC Managed Care – PPO | Attending: Cardiovascular Disease | Admitting: Nurse Practitioner

## 2022-10-28 ENCOUNTER — Telehealth: Payer: Self-pay

## 2022-10-28 DIAGNOSIS — Z01810 Encounter for preprocedural cardiovascular examination: Secondary | ICD-10-CM

## 2022-10-28 DIAGNOSIS — E1142 Type 2 diabetes mellitus with diabetic polyneuropathy: Secondary | ICD-10-CM

## 2022-10-28 DIAGNOSIS — N62 Hypertrophy of breast: Secondary | ICD-10-CM

## 2022-10-28 NOTE — Progress Notes (Signed)
Orders for updated A1C. Patient aware.

## 2022-10-28 NOTE — Telephone Encounter (Signed)
Fax received from [facility], Diona Browner, NP: "Per office protocol, patient can hold Xarelto for 3 days prior to procedure.   Please resume Xarelto as soon as possible postprocedure, at the discretion of the surgeon". Copy given to Donnamarie Rossetti, PA-C

## 2022-10-28 NOTE — H&P (View-Only) (Signed)
Received preoperative cardiovascular risk assessment and recommendations from patient's cardiology provider, Diona Browner, NP.  Per Diona Browner, NP, patient is acceptable risk for the planned procedure without any further cardiovascular testing.  Patient may hold Xarelto for 3 days prior to her procedure and resume the Xarelto as soon as possible after the procedure.  I called the patient and made sure she was aware that she can hold her Xarelto for 3 days prior to surgery.  Patient expressed understanding.

## 2022-10-28 NOTE — Progress Notes (Signed)
Virtual Visit via Telephone Note   Because of Laurie Mckinney's co-morbid illnesses, she is at least at moderate risk for complications without adequate follow up.  This format is felt to be most appropriate for this patient at this time.  The patient did not have access to video technology/had technical difficulties with video requiring transitioning to audio format only (telephone).  All issues noted in this document were discussed and addressed.  No physical exam could be performed with this format.  Please refer to the patient's chart for her consent to telehealth for [facility].  Evaluation Performed:  Preoperative cardiovascular risk assessment _____________   Date:  10/28/2022   Patient ID:  Laurie Mckinney, DOB May 23, 1961, MRN [id number] Patient Location:  Home Provider location:   Office  Primary Care Provider:  Denita Lung, MD Primary Cardiologist:  Carlyle Dolly, MD  Chief Complaint / Patient Profile   62 y.o. y/o female with a h/o persistent atrial fibrillation/atrial flutter s/p ablation in January 2021 on Xarelto, hypertension, type 2 diabetes, and OSA who is pending bilateral breast reduction with liposuction on 11/19/2022 with Dr. Audelia Hives of [facility] and presents today for telephonic preoperative cardiovascular risk assessment.  History of Present Illness    Laurie Mckinney is a 62 y.o. female who presents via audio/video conferencing for a telehealth visit today.  Pt was last seen in cardiology clinic on 01/14/2022 by Dr. Carlyle Dolly.  At that time Laurie Mckinney was doing well.  The patient is now pending procedure as outlined above. Since her last visit, she has done well from a cardiac standpoint.   She denies chest pain, palpitations, dyspnea, pnd, orthopnea, n, v, dizziness, syncope, edema, weight gain, or early satiety. All other systems reviewed and are otherwise negative except as noted above.   Past  Medical History    Past Medical History:  Diagnosis Date   Adjustment insomnia    SHIFT WORK   Allergy    RHINITIS   Asthma    Atrial flutter ([place]) 02/26/15   [facility]   Back pain    Bursitis    right hip   Cataract    see last eye eexam note    Constipation    Depression    Diabetes mellitus without complication (HCC)    History of atrial fibrillation    History of cancer    Hypertension    Incontinence    Joint pain    Migraine headache    Mobitz type 2 second degree heart block    Neuropathy    Obesity    Other fatigue    Shortness of breath on exertion    Sleep apnea    Past Surgical History:  Procedure Laterality Date   ABDOMINAL HYSTERECTOMY     ATRIAL FIBRILLATION ABLATION N/A 10/27/2019   Procedure: ATRIAL FIBRILLATION ABLATION;  Surgeon: Constance Haw, MD;  Location: [place];  Service: Cardiovascular;  Laterality: N/A;   CARDIOVERSION N/A 05/25/2019   Procedure: CARDIOVERSION;  Surgeon: Arnoldo Lenis, MD;  Location: [facility];  Service: Endoscopy;  Laterality: N/A;   COLONOSCOPY  2011   Dr.Brodie   ROBOTIC ASSISTED TOTAL HYSTERECTOMY WITH BILATERAL SALPINGO OOPHERECTOMY Bilateral 02/05/2015   Procedure: ROBOTIC ASSISTED TOTAL LAPAROSCOPIC HYSTERECTOMY WITH BILATERAL SALPINGO OOPHORECTOMY;  Surgeon: Everitt Amber, MD;  Location: [facility];  Service: Gynecology;  Laterality: Bilateral;    Allergies  No Known Allergies  Home Medications  Prior to Admission medications   Medication Sig Start Date End Date Taking? Authorizing Provider  acetaminophen (TYLENOL) 650 MG CR tablet Take 650 mg by mouth every 8 (eight) hours as needed for pain.    [provider]  atorvastatin (LIPITOR) 10 MG tablet TAKE 1 TABLET DAILY 05/21/22   Denita Lung, MD  buPROPion [facility] SR) 200 MG 12 hr tablet TAKE 1 TABLET BY MOUTH EVERY DAY 07/27/22   Denita Lung, MD  Lancets [facility] DELICA PLUS [id number]) MISC USE AND DISCARD 1  LANCET   DAILY 11/04/20   Denita Lung, MD  lisinopril-hydrochlorothiazide (ZESTORETIC) 20-25 MG tablet TAKE 1 TABLET DAILY (DOSE  INCREASE) 06/22/22   Denita Lung, MD  Misc Natural Products (IMMUNE FORMULA PO) Take 2 tablets by mouth daily. immuneti    [provider]  Multiple Vitamins-Minerals (CENTRUM SILVER 50+WOMEN) TABS Take 1 tablet by mouth daily.    [provider]  mupirocin ointment (BACTROBAN) 2 % Apply topically 2 (two) times daily. Patient taking differently: Apply topically as needed. 08/11/21   Danford, Valetta Fuller D, NP  ondansetron (ZOFRAN) 4 MG tablet Take 1 tablet (4 mg total) by mouth every 8 (eight) hours as needed for up to 20 doses for nausea or vomiting. Patient not taking: Reported on 10/26/2022 10/23/22   Clance Boll, PA-C  [facility] VERIO test strip USE AND DISCARD 1 TEST     STRIP AS NEEDED AS         INSTRUCTED 11/04/20   Denita Lung, MD  oxybutynin (DITROPAN-XL) 10 MG 24 hr tablet TAKE 1 TABLET AT BEDTIME 10/13/21   Denita Lung, MD  oxyCODONE (ROXICODONE) 5 MG immediate release tablet Take 1 tablet (5 mg total) by mouth every 8 (eight) hours as needed for up to 20 doses for severe pain. Patient not taking: Reported on 10/26/2022 10/23/22   Clance Boll, PA-C  PARoxetine (PAXIL) 20 MG tablet TAKE 1 TABLET BY MOUTH EVERY DAY 07/27/22   Denita Lung, MD  rivaroxaban (XARELTO) 20 MG TABS tablet TAKE 1 TABLET BY MOUTH DAILY WITH SUPPER 10/21/22   Denita Lung, MD  Semaglutide, 1 MG/DOSE, 4 MG/3ML SOPN Inject 1 mg as directed once a week. 10/26/22   Danford, Valetta Fuller D, NP  UNABLE TO FIND Take 1 tablet by mouth Nightly. Med Name: Theotis Burrow    [provider]  Vibegron (GEMTESA) 75 MG TABS Take by mouth at bedtime. Patient not taking: Reported on 10/26/2022    [provider]    Physical Exam    Vital Signs:  Laurie Mckinney does not have vital signs available for review today.  Given telephonic nature of communication,  physical exam is limited. AAOx3. NAD. Normal affect.  Speech and respirations are unlabored.  Accessory Clinical Findings    None  Assessment & Plan    1.  Preoperative Cardiovascular Risk Assessment:  According to the Revised Cardiac Risk Index (RCRI), her Perioperative Risk of Major Cardiac Event is (%): 0.4. Her Functional Capacity in METs is: 8.97 according to the [facility] Activity Status Index (DASI).Therefore, based on ACC/AHA guidelines, patient would be at acceptable risk for the planned procedure without further cardiovascular testing.   The patient was advised that if she develops new symptoms prior to surgery to contact our office to arrange for a follow-up visit, and she verbalized understanding.  Per office protocol, patient can hold Xarelto for 3 days prior to procedure.   Please resume Xarelto as  soon as possible postprocedure, at the discretion of the surgeon.   A copy of this note will be routed to requesting surgeon.  Time:   Today, I have spent 5 minutes with the patient with telehealth technology discussing medical history, symptoms, and management plan.     [name], NP  10/28/2022, 9:32 AM

## 2022-10-28 NOTE — Telephone Encounter (Signed)
Received fax confirmation from [facility].

## 2022-10-28 NOTE — Progress Notes (Signed)
Received preoperative cardiovascular risk assessment and recommendations from patient's cardiology provider, Emily Monge, NP.  Per Emily Monge, NP, patient is acceptable risk for the planned procedure without any further cardiovascular testing.  Patient may hold Xarelto for 3 days prior to her procedure and resume the Xarelto as soon as possible after the procedure.  I called the patient and made sure she was aware that she can hold her Xarelto for 3 days prior to surgery.  Patient expressed understanding. 

## 2022-10-28 NOTE — Telephone Encounter (Signed)
Called pt and adv that lab requisition was faxed to [facility] in [place] at the [facility] location. All she needed to do was to call them and make an appt and they would send the results once they received them. Pt conveyed understanding and agreed to make an appt.

## 2022-10-29 ENCOUNTER — Telehealth: Payer: Self-pay | Admitting: *Deleted

## 2022-10-29 DIAGNOSIS — E1142 Type 2 diabetes mellitus with diabetic polyneuropathy: Secondary | ICD-10-CM | POA: Diagnosis not present

## 2022-10-29 DIAGNOSIS — N62 Hypertrophy of breast: Secondary | ICD-10-CM | POA: Diagnosis not present

## 2022-10-29 NOTE — Telephone Encounter (Signed)
Fax receievd

## 2022-10-29 NOTE — Telephone Encounter (Signed)
Fax received from Dr. Lanice Shirts office. Pt is medically cleared for surgery on 11/19/2022. Emailed to [employer], PA-C and copy sent to batch scan

## 2022-10-30 LAB — HEMOGLOBIN A1C
Est. average glucose Bld gHb Est-mCnc: 120 mg/dL
Hgb A1c MFr Bld: 5.8 % — ABNORMAL HIGH (ref 4.8–5.6)

## 2022-11-04 ENCOUNTER — Encounter: Payer: Self-pay | Admitting: Student

## 2022-11-04 NOTE — Progress Notes (Signed)
Surgical Clearance has been received from patient's PCP, Dr. Lolande for patient's upcoming surgery with Dr. Taylor .    

## 2022-11-04 NOTE — H&P (View-Only) (Signed)
Surgical Clearance has been received from patient's PCP, Dr. Levin Bacon for patient's upcoming surgery with Dr. Lovena Le .

## 2022-11-09 NOTE — Progress Notes (Unsigned)
Chief Complaint:   OBESITY Laurie Mckinney is here to discuss her progress with her obesity treatment plan along with follow-up of her obesity related diagnoses. Laurie Mckinney is on the Category 3 Plan and states she is following her eating plan approximately 35% of the time. Laurie Mckinney states she is walking 30-40 minutes to times per week.  Today's visit was #: 53 Starting weight: 70 LBS Starting date: 11/14/2020 Today's weight: 210 LBS Today's date: 10/26/2022 Total lbs lost to date: 48 lbs Total lbs lost since last in-office visit: +2 lbs  Interim History: ***  Subjective:   1. Type 2 diabetes mellitus with other specified complication, without long-term current use of insulin (HCC) A1c *** Patient is currently on Ozempic 1 mg *** Fasting blood sugars <130.  She is taking OTC milk of magnesium several days per week.   2. Bilateral pendulous breasts Completed PT and accomplished mammogram 11/19/2022.  *** Patient bra size 44 DDD ? D or C.   3. Drug-induced constipation *** Last C scope ***  Assessment/Plan:   1. Type 2 diabetes mellitus with other specified complication, without long-term current use of insulin (HCC) Refill - Semaglutide, 1 MG/DOSE, 4 MG/3ML SOPN; Inject 1 mg as directed once a week.  Dispense: 9 mL; Refill: 0  2. Bilateral pendulous breasts Increase protein.   3. Drug-induced constipation Increase water, walking and OTC Miralax.   4. Obesity with current BMI 29.4 Laurie Mckinney is currently in the action stage of change. As such, her goal is to continue with weight loss efforts. She has agreed to the Category 3 Plan.   Exercise goals:  As is.   Behavioral modification strategies: increasing lean protein intake, decreasing simple carbohydrates, meal planning and cooking strategies, keeping healthy foods in the home, and planning for success.  Debar has agreed to follow-up with our clinic in 5 weeks. She was informed of the importance of frequent follow-up  visits to maximize her success with intensive lifestyle modifications for her multiple health conditions.   Objective:   Blood pressure 122/77, pulse 62, temperature 97.7 F (36.5 C), height '5\' 11"'$  (1.803 m), last menstrual period 01/26/2011, SpO2 95 %. Body mass index is 29.99 kg/m.  General: Cooperative, alert, well developed, in no acute distress. HEENT: Conjunctivae and lids unremarkable. Cardiovascular: Regular rhythm.  Lungs: Normal work of breathing. Neurologic: No focal deficits.   Lab Results  Component Value Date   CREATININE 0.79 03/09/2022   BUN 13 03/09/2022   NA 141 03/09/2022   K 4.5 03/09/2022   CL 102 03/09/2022   CO2 25 03/09/2022   Lab Results  Component Value Date   ALT 18 03/09/2022   AST 22 03/09/2022   ALKPHOS 77 03/09/2022   BILITOT 0.5 03/09/2022   Lab Results  Component Value Date   HGBA1C 5.8 (H) 10/29/2022   HGBA1C 5.9 (H) 08/03/2022   HGBA1C 5.5 03/09/2022   HGBA1C 5.8 (A) 12/18/2021   HGBA1C 5.8 (A) 06/20/2021   Lab Results  Component Value Date   INSULIN 9.7 05/13/2021   INSULIN 4.0 11/14/2020   Lab Results  Component Value Date   TSH 0.724 11/14/2020   Lab Results  Component Value Date   CHOL 108 03/09/2022   HDL 62 03/09/2022   LDLCALC 37 03/09/2022   TRIG 27 03/09/2022   CHOLHDL 1.7 03/09/2022   Lab Results  Component Value Date   VD25OH 40.5 11/14/2020   VD25OH 35 08/14/2011   Lab Results  Component Value Date  WBC 8.0 03/09/2022   HGB 13.6 03/09/2022   HCT 40.1 03/09/2022   MCV 91 03/09/2022   PLT 271 03/09/2022   No results found for: "IRON", "TIBC", "FERRITIN"  Attestation Statements:   Reviewed by clinician on day of visit: allergies, medications, problem list, medical history, surgical history, family history, social history, and previous encounter notes.  I, Davy Pique, RMA, am acting as Location manager for Mina Marble, NP.  I have reviewed the above documentation for accuracy and completeness,  and I agree with the above. -  ***

## 2022-11-13 ENCOUNTER — Encounter (HOSPITAL_BASED_OUTPATIENT_CLINIC_OR_DEPARTMENT_OTHER): Payer: Self-pay | Admitting: Plastic Surgery

## 2022-11-13 ENCOUNTER — Other Ambulatory Visit: Payer: Self-pay | Admitting: Family Medicine

## 2022-11-13 DIAGNOSIS — E1159 Type 2 diabetes mellitus with other circulatory complications: Secondary | ICD-10-CM

## 2022-11-16 ENCOUNTER — Encounter (HOSPITAL_BASED_OUTPATIENT_CLINIC_OR_DEPARTMENT_OTHER)
Admission: RE | Admit: 2022-11-16 | Discharge: 2022-11-16 | Disposition: A | Discharge status: Home / Self Care | Payer: BC Managed Care – PPO | Source: Ambulatory Visit | Attending: Plastic Surgery | Admitting: Plastic Surgery

## 2022-11-16 DIAGNOSIS — N6032 Fibrosclerosis of left breast: Secondary | ICD-10-CM | POA: Diagnosis not present

## 2022-11-16 DIAGNOSIS — G4733 Obstructive sleep apnea (adult) (pediatric): Secondary | ICD-10-CM | POA: Diagnosis not present

## 2022-11-16 DIAGNOSIS — Z7985 Long-term (current) use of injectable non-insulin antidiabetic drugs: Secondary | ICD-10-CM | POA: Diagnosis not present

## 2022-11-16 DIAGNOSIS — N6031 Fibrosclerosis of right breast: Secondary | ICD-10-CM | POA: Diagnosis not present

## 2022-11-16 DIAGNOSIS — Z01812 Encounter for preprocedural laboratory examination: Secondary | ICD-10-CM | POA: Insufficient documentation

## 2022-11-16 DIAGNOSIS — E119 Type 2 diabetes mellitus without complications: Secondary | ICD-10-CM | POA: Diagnosis not present

## 2022-11-16 DIAGNOSIS — F418 Other specified anxiety disorders: Secondary | ICD-10-CM | POA: Diagnosis not present

## 2022-11-16 DIAGNOSIS — I4891 Unspecified atrial fibrillation: Secondary | ICD-10-CM | POA: Diagnosis not present

## 2022-11-16 DIAGNOSIS — N62 Hypertrophy of breast: Secondary | ICD-10-CM | POA: Diagnosis not present

## 2022-11-16 DIAGNOSIS — M542 Cervicalgia: Secondary | ICD-10-CM | POA: Diagnosis not present

## 2022-11-16 DIAGNOSIS — G709 Myoneural disorder, unspecified: Secondary | ICD-10-CM | POA: Diagnosis not present

## 2022-11-16 DIAGNOSIS — Z7901 Long term (current) use of anticoagulants: Secondary | ICD-10-CM | POA: Diagnosis not present

## 2022-11-16 DIAGNOSIS — Z8542 Personal history of malignant neoplasm of other parts of uterus: Secondary | ICD-10-CM | POA: Diagnosis not present

## 2022-11-16 DIAGNOSIS — I10 Essential (primary) hypertension: Secondary | ICD-10-CM | POA: Diagnosis not present

## 2022-11-16 DIAGNOSIS — M549 Dorsalgia, unspecified: Secondary | ICD-10-CM | POA: Diagnosis not present

## 2022-11-16 LAB — BASIC METABOLIC PANEL
Anion gap: 8 (ref 5–15)
BUN: 17 mg/dL (ref 8–23)
CO2: 27 mmol/L (ref 22–32)
Calcium: 9.2 mg/dL (ref 8.9–10.3)
Chloride: 102 mmol/L (ref 98–111)
Creatinine, Ser: 0.72 mg/dL (ref 0.44–1.00)
GFR, Estimated: >60 mL/min (ref >60)
Glucose, Bld: 104 mg/dL — ABNORMAL HIGH (ref 70–99)
Potassium: 4.5 mmol/L (ref 3.5–5.1)
Sodium: 137 mmol/L (ref 135–145)

## 2022-11-16 MED ORDER — CHLORHEXIDINE GLUCONATE CLOTH 2 % EX PADS: 6.0000 | MEDICATED_PAD | Freq: Once | CUTANEOUS | Status: DC

## 2022-11-16 NOTE — Progress Notes (Signed)
Surgical soap given with instructions, pt verbalized understanding.  

## 2022-11-18 NOTE — Anesthesia Preprocedure Evaluation (Signed)
Anesthesia Evaluation  Patient identified by MRN, date of birth, ID band Patient awake    Reviewed: Allergy & Precautions, NPO status , Patient's Chart, lab work & pertinent test results  Airway Mallampati: III  TM Distance: >3 FB Neck ROM: Full    Dental no notable dental hx.    Pulmonary sleep apnea and Continuous Positive Airway Pressure Ventilation , PE   Pulmonary exam normal        Cardiovascular hypertension, Pt. on medications Normal cardiovascular exam+ dysrhythmias (s/p ablation) Atrial Fibrillation      Neuro/Psych  Headaches PSYCHIATRIC DISORDERS Anxiety Depression     Neuromuscular disease    GI/Hepatic negative GI ROS, Neg liver ROS,,,  Endo/Other  diabetes    Renal/GU negative Renal ROS     Musculoskeletal negative musculoskeletal ROS (+)    Abdominal  (+) + obese  Peds  Hematology negative hematology ROS (+)   Anesthesia Other Findings macromastia  Reproductive/Obstetrics                             Anesthesia Physical Anesthesia Plan  ASA: 3  Anesthesia Plan: General   Post-op Pain Management:    Induction: Intravenous  PONV Risk Score and Plan: 3 and Ondansetron, Dexamethasone, Midazolam and Treatment may vary due to age or medical condition  Airway Management Planned: Oral ETT  Additional Equipment:   Intra-op Plan:   Post-operative Plan: Extubation in OR  Informed Consent: I have reviewed the patients History and Physical, chart, labs and discussed the procedure including the risks, benefits and alternatives for the proposed anesthesia with the patient or authorized representative who has indicated his/her understanding and acceptance.     Dental advisory given  Plan Discussed with: CRNA  Anesthesia Plan Comments:        Anesthesia Quick Evaluation

## 2022-11-19 ENCOUNTER — Ambulatory Visit (HOSPITAL_BASED_OUTPATIENT_CLINIC_OR_DEPARTMENT_OTHER)
Admission: RE | Admit: 2022-11-19 | Discharge: 2022-11-19 | Disposition: A | Discharge status: Home / Self Care | Payer: BC Managed Care – PPO | Attending: Plastic Surgery | Admitting: Plastic Surgery

## 2022-11-19 ENCOUNTER — Encounter (HOSPITAL_BASED_OUTPATIENT_CLINIC_OR_DEPARTMENT_OTHER)
Admission: RE | Disposition: A | Discharge status: Home / Self Care | Payer: Self-pay | Source: Home / Self Care | Attending: Plastic Surgery

## 2022-11-19 ENCOUNTER — Ambulatory Visit (HOSPITAL_BASED_OUTPATIENT_CLINIC_OR_DEPARTMENT_OTHER): Payer: BC Managed Care – PPO | Admitting: Anesthesiology

## 2022-11-19 ENCOUNTER — Encounter (HOSPITAL_BASED_OUTPATIENT_CLINIC_OR_DEPARTMENT_OTHER): Payer: Self-pay | Admitting: Plastic Surgery

## 2022-11-19 ENCOUNTER — Other Ambulatory Visit: Payer: Self-pay

## 2022-11-19 DIAGNOSIS — G709 Myoneural disorder, unspecified: Secondary | ICD-10-CM | POA: Diagnosis not present

## 2022-11-19 DIAGNOSIS — M549 Dorsalgia, unspecified: Secondary | ICD-10-CM | POA: Diagnosis not present

## 2022-11-19 DIAGNOSIS — Z01818 Encounter for other preprocedural examination: Secondary | ICD-10-CM

## 2022-11-19 DIAGNOSIS — N6032 Fibrosclerosis of left breast: Secondary | ICD-10-CM | POA: Diagnosis not present

## 2022-11-19 DIAGNOSIS — E119 Type 2 diabetes mellitus without complications: Secondary | ICD-10-CM | POA: Diagnosis not present

## 2022-11-19 DIAGNOSIS — F418 Other specified anxiety disorders: Secondary | ICD-10-CM | POA: Diagnosis not present

## 2022-11-19 DIAGNOSIS — N6031 Fibrosclerosis of right breast: Secondary | ICD-10-CM | POA: Diagnosis not present

## 2022-11-19 DIAGNOSIS — N62 Hypertrophy of breast: Secondary | ICD-10-CM | POA: Diagnosis not present

## 2022-11-19 DIAGNOSIS — I4891 Unspecified atrial fibrillation: Secondary | ICD-10-CM | POA: Insufficient documentation

## 2022-11-19 DIAGNOSIS — G4733 Obstructive sleep apnea (adult) (pediatric): Secondary | ICD-10-CM | POA: Diagnosis not present

## 2022-11-19 DIAGNOSIS — Z7901 Long term (current) use of anticoagulants: Secondary | ICD-10-CM | POA: Insufficient documentation

## 2022-11-19 DIAGNOSIS — I10 Essential (primary) hypertension: Secondary | ICD-10-CM | POA: Diagnosis not present

## 2022-11-19 DIAGNOSIS — N6002 Solitary cyst of left breast: Secondary | ICD-10-CM | POA: Diagnosis not present

## 2022-11-19 DIAGNOSIS — Z8542 Personal history of malignant neoplasm of other parts of uterus: Secondary | ICD-10-CM | POA: Diagnosis not present

## 2022-11-19 DIAGNOSIS — Z7985 Long-term (current) use of injectable non-insulin antidiabetic drugs: Secondary | ICD-10-CM | POA: Diagnosis not present

## 2022-11-19 DIAGNOSIS — M542 Cervicalgia: Secondary | ICD-10-CM | POA: Diagnosis not present

## 2022-11-19 HISTORY — PX: BREAST REDUCTION SURGERY: SHX8

## 2022-11-19 LAB — GLUCOSE, CAPILLARY
Glucose-Capillary: 107 mg/dL — ABNORMAL HIGH (ref 70–99)
Glucose-Capillary: 137 mg/dL — ABNORMAL HIGH (ref 70–99)

## 2022-11-19 SURGERY — BREAST REDUCTION WITH LIPOSUCTION
Anesthesia: General | Site: Breast | Laterality: Bilateral

## 2022-11-19 MED ORDER — SODIUM CHLORIDE (PF) 0.9 % IJ SOLN
INTRAMUSCULAR | Status: DC | PRN
  Administered 2022-11-19: 20 mL

## 2022-11-19 MED ORDER — ACETAMINOPHEN 500 MG PO TABS
ORAL_TABLET | ORAL | Status: AC
  Filled 2022-11-19: qty 2

## 2022-11-19 MED ORDER — SUGAMMADEX SODIUM 200 MG/2ML IV SOLN
INTRAVENOUS | Status: DC | PRN
  Administered 2022-11-19: 200 mg via INTRAVENOUS

## 2022-11-19 MED ORDER — ONDANSETRON HCL 4 MG/2ML IJ SOLN
INTRAMUSCULAR | Status: AC
  Filled 2022-11-19: qty 2

## 2022-11-19 MED ORDER — SODIUM CHLORIDE 0.9 % IV SOLN
INTRAVENOUS | Status: DC | PRN
  Administered 2022-11-19: 40 mL

## 2022-11-19 MED ORDER — PHENYLEPHRINE HCL (PRESSORS) 10 MG/ML IV SOLN
INTRAVENOUS | Status: DC | PRN
  Administered 2022-11-19 (×5): 80 ug via INTRAVENOUS

## 2022-11-19 MED ORDER — DEXMEDETOMIDINE HCL IN NACL 80 MCG/20ML IV SOLN
INTRAVENOUS | Status: DC | PRN
  Administered 2022-11-19 (×6): 4 ug via BUCCAL

## 2022-11-19 MED ORDER — LACTATED RINGERS IV SOLN: INTRAVENOUS | Status: DC

## 2022-11-19 MED ORDER — LIDOCAINE 2% (20 MG/ML) 5 ML SYRINGE
INTRAMUSCULAR | Status: AC
  Filled 2022-11-19: qty 5

## 2022-11-19 MED ORDER — EPHEDRINE SULFATE (PRESSORS) 50 MG/ML IJ SOLN
INTRAMUSCULAR | Status: DC | PRN
  Administered 2022-11-19 (×2): 5 mg via INTRAVENOUS

## 2022-11-19 MED ORDER — SODIUM CHLORIDE 0.9% FLUSH: 3.0000 mL | Freq: Two times a day (BID) | INTRAVENOUS | Status: DC

## 2022-11-19 MED ORDER — GLYCOPYRROLATE 0.2 MG/ML IJ SOLN
INTRAMUSCULAR | Status: DC | PRN
  Administered 2022-11-19: .1 mg via INTRAVENOUS

## 2022-11-19 MED ORDER — FENTANYL CITRATE (PF) 100 MCG/2ML IJ SOLN: 25.0000 ug | INTRAMUSCULAR | Status: DC | PRN

## 2022-11-19 MED ORDER — ARTIFICIAL TEARS OPHTHALMIC OINT
TOPICAL_OINTMENT | OPHTHALMIC | Status: AC
  Filled 2022-11-19: qty 3.5

## 2022-11-19 MED ORDER — ARTIFICIAL TEARS OPHTHALMIC OINT
TOPICAL_OINTMENT | OPHTHALMIC | Status: DC | PRN
  Administered 2022-11-19: 1 via OPHTHALMIC

## 2022-11-19 MED ORDER — DEXAMETHASONE SODIUM PHOSPHATE 10 MG/ML IJ SOLN
INTRAMUSCULAR | Status: AC
  Filled 2022-11-19: qty 1

## 2022-11-19 MED ORDER — DEXAMETHASONE SODIUM PHOSPHATE 10 MG/ML IJ SOLN
INTRAMUSCULAR | Status: DC | PRN
  Administered 2022-11-19: 5 mg via INTRAVENOUS

## 2022-11-19 MED ORDER — SUGAMMADEX SODIUM 200 MG/2ML IV SOLN: INTRAVENOUS | Status: DC | PRN

## 2022-11-19 MED ORDER — LIDOCAINE-EPINEPHRINE 1 %-1:100000 IJ SOLN
INTRAMUSCULAR | Status: AC
  Filled 2022-11-19: qty 1

## 2022-11-19 MED ORDER — LIDOCAINE HCL (PF) 1 % IJ SOLN
INTRAMUSCULAR | Status: AC
  Filled 2022-11-19: qty 30

## 2022-11-19 MED ORDER — PHENYLEPHRINE 80 MCG/ML (10ML) SYRINGE FOR IV PUSH (FOR BLOOD PRESSURE SUPPORT)
PREFILLED_SYRINGE | INTRAVENOUS | Status: AC
  Filled 2022-11-19: qty 10

## 2022-11-19 MED ORDER — PROPOFOL 500 MG/50ML IV EMUL
INTRAVENOUS | Status: AC
  Filled 2022-11-19: qty 50

## 2022-11-19 MED ORDER — FENTANYL CITRATE (PF) 100 MCG/2ML IJ SOLN
INTRAMUSCULAR | Status: AC
  Filled 2022-11-19: qty 2

## 2022-11-19 MED ORDER — OXYCODONE HCL 5 MG/5ML PO SOLN: 5.0000 mg | Freq: Once | ORAL | Status: DC | PRN

## 2022-11-19 MED ORDER — AMISULPRIDE (ANTIEMETIC) 5 MG/2ML IV SOLN: 10.0000 mg | Freq: Once | INTRAVENOUS | Status: DC | PRN

## 2022-11-19 MED ORDER — 0.9 % SODIUM CHLORIDE (POUR BTL) OPTIME
TOPICAL | Status: DC | PRN
  Administered 2022-11-19: 1000 mL

## 2022-11-19 MED ORDER — OXYCODONE HCL 5 MG PO TABS: 5.0000 mg | ORAL_TABLET | Freq: Once | ORAL | Status: DC | PRN

## 2022-11-19 MED ORDER — ROCURONIUM BROMIDE 10 MG/ML (PF) SYRINGE
PREFILLED_SYRINGE | INTRAVENOUS | Status: AC
  Filled 2022-11-19: qty 10

## 2022-11-19 MED ORDER — EPHEDRINE 5 MG/ML INJ
INTRAVENOUS | Status: AC
  Filled 2022-11-19: qty 5

## 2022-11-19 MED ORDER — EPINEPHRINE PF 1 MG/ML IJ SOLN
INTRAMUSCULAR | Status: AC
  Filled 2022-11-19: qty 1

## 2022-11-19 MED ORDER — LIDOCAINE-EPINEPHRINE 1 %-1:100000 IJ SOLN
INTRAMUSCULAR | Status: DC | PRN
  Administered 2022-11-19: 40 mL

## 2022-11-19 MED ORDER — FENTANYL CITRATE (PF) 100 MCG/2ML IJ SOLN
INTRAMUSCULAR | Status: DC | PRN
  Administered 2022-11-19: 100 ug via INTRAVENOUS
  Administered 2022-11-19 (×2): 50 ug via INTRAVENOUS

## 2022-11-19 MED ORDER — MIDAZOLAM HCL 2 MG/2ML IJ SOLN
INTRAMUSCULAR | Status: AC
  Filled 2022-11-19: qty 2

## 2022-11-19 MED ORDER — BUPIVACAINE HCL (PF) 0.25 % IJ SOLN
INTRAMUSCULAR | Status: AC
  Filled 2022-11-19: qty 30

## 2022-11-19 MED ORDER — PROMETHAZINE HCL 25 MG/ML IJ SOLN: 6.2500 mg | INTRAMUSCULAR | Status: DC | PRN

## 2022-11-19 MED ORDER — ONDANSETRON HCL 4 MG/2ML IJ SOLN
INTRAMUSCULAR | Status: DC | PRN
  Administered 2022-11-19: 4 mg via INTRAVENOUS

## 2022-11-19 MED ORDER — ACETAMINOPHEN 325 MG RE SUPP: 650.0000 mg | RECTAL | Status: DC | PRN

## 2022-11-19 MED ORDER — SODIUM CHLORIDE (PF) 0.9 % IJ SOLN
INTRAMUSCULAR | Status: AC
  Filled 2022-11-19: qty 10

## 2022-11-19 MED ORDER — SODIUM CHLORIDE 0.9% FLUSH: 3.0000 mL | INTRAVENOUS | Status: DC | PRN

## 2022-11-19 MED ORDER — PROPOFOL 10 MG/ML IV BOLUS
INTRAVENOUS | Status: DC | PRN
  Administered 2022-11-19: 150 mg via INTRAVENOUS
  Administered 2022-11-19 (×2): 50 mg via INTRAVENOUS

## 2022-11-19 MED ORDER — CEFAZOLIN SODIUM-DEXTROSE 2-4 GM/100ML-% IV SOLN
INTRAVENOUS | Status: AC
  Filled 2022-11-19: qty 100

## 2022-11-19 MED ORDER — SODIUM CHLORIDE 0.9 % IV SOLN: 250.0000 mL | INTRAVENOUS | Status: DC | PRN

## 2022-11-19 MED ORDER — LIDOCAINE HCL (CARDIAC) PF 100 MG/5ML IV SOSY
PREFILLED_SYRINGE | INTRAVENOUS | Status: DC | PRN
  Administered 2022-11-19: 60 mg via INTRATRACHEAL

## 2022-11-19 MED ORDER — PROPOFOL 500 MG/50ML IV EMUL
INTRAVENOUS | Status: DC | PRN
  Administered 2022-11-19: 25 ug/kg/min via INTRAVENOUS

## 2022-11-19 MED ORDER — ROCURONIUM BROMIDE 100 MG/10ML IV SOLN
INTRAVENOUS | Status: DC | PRN
  Administered 2022-11-19: 40 mg via INTRAVENOUS

## 2022-11-19 MED ORDER — ACETAMINOPHEN 325 MG PO TABS: 650.0000 mg | ORAL_TABLET | ORAL | Status: DC | PRN

## 2022-11-19 MED ORDER — KETOROLAC TROMETHAMINE 30 MG/ML IJ SOLN: 30.0000 mg | Freq: Once | INTRAMUSCULAR | Status: DC | PRN

## 2022-11-19 MED ORDER — ACETAMINOPHEN 500 MG PO TABS
1000.0000 mg | ORAL_TABLET | Freq: Once | ORAL | Status: AC
  Administered 2022-11-19: 1000 mg via ORAL

## 2022-11-19 MED ORDER — BUPIVACAINE LIPOSOME 1.3 % IJ SUSP
INTRAMUSCULAR | Status: AC
  Filled 2022-11-19: qty 20

## 2022-11-19 MED ORDER — OXYCODONE HCL 5 MG PO TABS: 5.0000 mg | ORAL_TABLET | ORAL | Status: DC | PRN

## 2022-11-19 MED ORDER — GLYCOPYRROLATE 0.2 MG/ML IJ SOLN
INTRAMUSCULAR | Status: AC
  Filled 2022-11-19: qty 2

## 2022-11-19 MED ORDER — CEFAZOLIN SODIUM-DEXTROSE 2-4 GM/100ML-% IV SOLN
2.0000 g | INTRAVENOUS | Status: AC
  Administered 2022-11-19: 2 g via INTRAVENOUS

## 2022-11-19 MED ORDER — MIDAZOLAM HCL 2 MG/2ML IJ SOLN
INTRAMUSCULAR | Status: DC | PRN
  Administered 2022-11-19: 2 mg via INTRAVENOUS

## 2022-11-19 MED ORDER — LIDOCAINE HCL 1 % IJ SOLN
INTRAVENOUS | Status: DC | PRN
  Administered 2022-11-19: 1000 mL

## 2022-11-19 SURGICAL SUPPLY — 70 items
ADH SKN CLS APL DERMABOND .7 (GAUZE/BANDAGES/DRESSINGS) ×4
AGENT HMST PWDR BTL CLGN 5GM (Miscellaneous) ×1 IMPLANT
BAG DECANTER FOR FLEXI CONT (MISCELLANEOUS) ×1 IMPLANT
BINDER BREAST LRG (GAUZE/BANDAGES/DRESSINGS) IMPLANT
BINDER BREAST MEDIUM (GAUZE/BANDAGES/DRESSINGS) IMPLANT
BINDER BREAST XLRG (GAUZE/BANDAGES/DRESSINGS) IMPLANT
BINDER BREAST XXLRG (GAUZE/BANDAGES/DRESSINGS) IMPLANT
BIOPATCH RED 1 DISK 7.0 (GAUZE/BANDAGES/DRESSINGS) IMPLANT
BLADE HEX COATED 2.75 (ELECTRODE) IMPLANT
BLADE KNIFE PERSONA 10 (BLADE) ×2 IMPLANT
BLADE SURG 15 STRL LF DISP TIS (BLADE) ×1 IMPLANT
BLADE SURG 15 STRL SS (BLADE) ×1
CANISTER SUCT 1200ML W/VALVE (MISCELLANEOUS) ×1 IMPLANT
COLLAGEN CELLERATERX 5 GRAM (Miscellaneous) IMPLANT
COVER BACK TABLE 60X90IN (DRAPES) ×1 IMPLANT
COVER MAYO STAND STRL (DRAPES) ×1 IMPLANT
DERMABOND ADVANCED .7 DNX12 (GAUZE/BANDAGES/DRESSINGS) ×2 IMPLANT
DRAIN CHANNEL 19F RND (DRAIN) IMPLANT
DRAPE LAPAROSCOPIC ABDOMINAL (DRAPES) ×1 IMPLANT
DRAPE UTILITY XL STRL (DRAPES) IMPLANT
DRSG MEPILEX POST OP 4X8 (GAUZE/BANDAGES/DRESSINGS) ×2 IMPLANT
ELECT BLADE 4.0 EZ CLEAN MEGAD (MISCELLANEOUS) ×1
ELECT REM PT RETURN 9FT ADLT (ELECTROSURGICAL) ×1
ELECTRODE BLDE 4.0 EZ CLN MEGD (MISCELLANEOUS) ×1 IMPLANT
ELECTRODE REM PT RTRN 9FT ADLT (ELECTROSURGICAL) ×1 IMPLANT
EVACUATOR SILICONE 100CC (DRAIN) IMPLANT
GAUZE PAD ABD 8X10 STRL (GAUZE/BANDAGES/DRESSINGS) ×2 IMPLANT
GLOVE BIO SURGEON STRL SZ 6.5 (GLOVE) ×3 IMPLANT
GLOVE BIO SURGEON STRL SZ7.5 (GLOVE) ×1 IMPLANT
GLOVE BIOGEL PI IND STRL 7.0 (GLOVE) IMPLANT
GLOVE BIOGEL PI IND STRL 7.5 (GLOVE) IMPLANT
GLOVE SURG SYN 7.5  E (GLOVE) ×1
GLOVE SURG SYN 7.5 E (GLOVE) ×1 IMPLANT
GLOVE SURG SYN 7.5 PF PI (GLOVE) IMPLANT
GOWN STRL REUS W/ TWL LRG LVL3 (GOWN DISPOSABLE) ×2 IMPLANT
GOWN STRL REUS W/ TWL XL LVL3 (GOWN DISPOSABLE) ×1 IMPLANT
GOWN STRL REUS W/TWL LRG LVL3 (GOWN DISPOSABLE) ×3
GOWN STRL REUS W/TWL XL LVL3 (GOWN DISPOSABLE) ×2
NDL FILTER BLUNT 18X1 1/2 (NEEDLE) IMPLANT
NDL HYPO 25X1 1.5 SAFETY (NEEDLE) ×1 IMPLANT
NEEDLE FILTER BLUNT 18X1 1/2 (NEEDLE) ×1 IMPLANT
NEEDLE HYPO 25X1 1.5 SAFETY (NEEDLE) ×1 IMPLANT
NS IRRIG 1000ML POUR BTL (IV SOLUTION) ×1 IMPLANT
PACK BASIN DAY SURGERY FS (CUSTOM PROCEDURE TRAY) ×1 IMPLANT
PAD ALCOHOL SWAB (MISCELLANEOUS) IMPLANT
PAD FOAM SILICONE BACKED (GAUZE/BANDAGES/DRESSINGS) IMPLANT
PENCIL SMOKE EVACUATOR (MISCELLANEOUS) ×1 IMPLANT
PIN SAFETY STERILE (MISCELLANEOUS) IMPLANT
SLEEVE SCD COMPRESS KNEE MED (STOCKING) ×1 IMPLANT
SPIKE FLUID TRANSFER (MISCELLANEOUS) IMPLANT
SPONGE T-LAP 18X18 ~~LOC~~+RFID (SPONGE) ×2 IMPLANT
STRIP SUTURE WOUND CLOSURE 1/2 (MISCELLANEOUS) ×2 IMPLANT
SUT MNCRL AB 4-0 PS2 18 (SUTURE) ×4 IMPLANT
SUT MON AB 3-0 SH 27 (SUTURE) ×8
SUT MON AB 3-0 SH27 (SUTURE) ×4 IMPLANT
SUT MON AB 5-0 PS2 18 (SUTURE) IMPLANT
SUT PDS 3-0 CT2 (SUTURE) ×6
SUT PDS II 3-0 CT2 27 ABS (SUTURE) ×4 IMPLANT
SUT SILK 3 0 PS 1 (SUTURE) IMPLANT
SYR 50ML LL SCALE MARK (SYRINGE) IMPLANT
SYR BULB IRRIG 60ML STRL (SYRINGE) ×1 IMPLANT
SYR CONTROL 10ML LL (SYRINGE) ×1 IMPLANT
TAPE MEASURE VINYL STERILE (MISCELLANEOUS) IMPLANT
TOWEL GREEN STERILE FF (TOWEL DISPOSABLE) ×2 IMPLANT
TRAY DSU PREP LF (CUSTOM PROCEDURE TRAY) ×1 IMPLANT
TUBE CONNECTING 20X1/4 (TUBING) ×1 IMPLANT
TUBING INFILTRATION IT-10001 (TUBING) IMPLANT
TUBING SET GRADUATE ASPIR 12FT (MISCELLANEOUS) IMPLANT
UNDERPAD 30X36 HEAVY ABSORB (UNDERPADS AND DIAPERS) ×2 IMPLANT
YANKAUER SUCT BULB TIP NO VENT (SUCTIONS) ×1 IMPLANT

## 2022-11-19 NOTE — Interval H&P Note (Signed)
History and Physical Interval Note:  11/19/2022 6:59 AM  Laurie Mckinney  has presented today for surgery, with the diagnosis of macromastia.  The various methods of treatment have been discussed with the patient and family. After consideration of risks, benefits and other options for treatment, the patient has consented to  Procedure(s): BREAST REDUCTION WITH LIPOSUCTION (Bilateral) as a surgical intervention.  The patient's history has been reviewed, patient examined, no change in status, stable for surgery.  I have reviewed the patient's chart and labs.  Questions were answered to the patient's satisfaction.     [name]

## 2022-11-19 NOTE — Op Note (Signed)
Breast Reduction Op note:    DATE OF PROCEDURE: 11/19/2022  LOCATION: [facility]  SURGEON: Lyndee Leo Sanger Nanako Stopher, DO  ASSISTANT: Roetta Sessions, PA  PREOPERATIVE DIAGNOSIS 1. Macromastia 2. Neck Pain 3. Back Pain  POSTOPERATIVE DIAGNOSIS 1. Macromastia 2. Neck Pain 3. Back Pain  PROCEDURES 1. Bilateral breast reduction.  Right reduction 750 g, Left reduction 0000000 g  COMPLICATIONS: None.  DRAINS: none  INDICATIONS FOR PROCEDURE Laurie Mckinney is a 62 y.o. year-old female born on 12/07/1960,with a history of symptomatic macromastia with concominant back pain, neck pain, shoulder grooving from her bra.   MRN: [id number]  CONSENT Informed consent was obtained directly from the patient. The risks, benefits and alternatives were fully discussed. Specific risks including but not limited to bleeding, infection, hematoma, seroma, scarring, pain, nipple necrosis, asymmetry, poor cosmetic results, and need for further surgery were discussed. The patient's questions were answered.  DESCRIPTION OF PROCEDURE  Patient was brought into the operating room and rested on the operating room table in the supine position.  SCDs were placed and appropriate padding was performed.  Antibiotics were given. The patient underwent general anesthesia and the chest was prepped and draped in a sterile fashion.  A timeout was performed and all information was confirmed to be correct by those in the room. Tumescent was placed in the lateral breasts and liposuction done for better contouring (left 300, right 300 cc)  Right side: Preoperative markings were confirmed.  Incision lines were injected with local containing epinephrine.  After waiting for vasoconstriction, the marked lines were incised with a #15 blade.  A Wise-pattern superomedial breast reduction was performed by de-epithelializing the pedicle, using bovie to create the superomedial pedicle, and removing breast tissue from  the lateral and inferior portions of the breast.  Care was taken to not undermine the breast pedicle. Hemostasis was achieved.  Experel and Cellerate were placed. The nipple was gently rotated into position and the soft tissue closed with 4-0 Monocryl.   The pocket was irrigated and hemostasis confirmed.  The deep tissues were approximated with 3-0 PDS sutures.  The skin was closed with deep dermal 3-0 Monocryl and subcuticular 4-0 Monocryl sutures.  The nipple and skin flaps had good capillary refill at the end of the procedure.    Left side: Preoperative markings were confirmed.  Incision lines were injected with local containing epinephrine.  After waiting for vasoconstriction, the marked lines were incised with a #15 blade.  A Wise-pattern superomedial breast reduction was performed by de-epithelializing the pedicle, using bovie to create the superomedial pedicle, and removing breast tissue from the lateral and inferior portions of the breast.  Care was taken to not undermine the breast pedicle. Hemostasis was achieved.  The nipple was gently rotated into position and the soft tissue was closed with 4-0 Monocryl.  Experel and Cellerate were placed in the breast pocket.  The patient was sat upright and size and shape symmetry was confirmed.  The pocket was irrigated and hemostasis confirmed.  The deep tissues were approximated with 3-0 PDS sutures. The skin was closed with deep dermal 3-0 Monocryl and subcuticular 4-0 Monocryl sutures.  Dermabond was applied.  A breast binder and ABDs were placed.  The nipple and skin flaps had good capillary refill at the end of the procedure.  The patient tolerated the procedure well. The patient was allowed to wake from anesthesia and taken to the recovery room in satisfactory condition.  The advanced practice practitioner (APP) assisted throughout  the case.  The APP was essential in retraction and counter traction when needed to make the case progress smoothly.  This  retraction and assistance made it possible to see the tissue plans for the procedure.  The assistance was needed for blood control, tissue re-approximation and assisted with closure of the incision site.

## 2022-11-19 NOTE — Interval H&P Note (Signed)
History and Physical Interval Note:  11/19/2022 6:59 AM  Laurie Mckinney  has presented today for surgery, with the diagnosis of macromastia.  The various methods of treatment have been discussed with the patient and family. After consideration of risks, benefits and other options for treatment, the patient has consented to  Procedure(s): BREAST REDUCTION WITH LIPOSUCTION (Bilateral) as a surgical intervention.  The patient's history has been reviewed, patient examined, no change in status, stable for surgery.  I have reviewed the patient's chart and labs.  Questions were answered to the patient's satisfaction.     Loel Lofty Omarrion Carmer

## 2022-11-19 NOTE — Anesthesia Postprocedure Evaluation (Signed)
Anesthesia Post Note  Patient: Laurie Mckinney  Procedure(s) Performed: BREAST REDUCTION WITH LIPOSUCTION (Bilateral: Breast)     Patient location during evaluation: PACU Anesthesia Type: General Level of consciousness: awake Pain management: pain level controlled Vital Signs Assessment: post-procedure vital signs reviewed and stable Respiratory status: spontaneous breathing, nonlabored ventilation and respiratory function stable Cardiovascular status: blood pressure returned to baseline and stable Postop Assessment: no apparent nausea or vomiting Anesthetic complications: no   No notable events documented.  Last Vitals:  Vitals:   11/19/22 1100 11/19/22 1136  BP: (!) 121/104 129/69  Pulse: 62 63  Resp: 20 16  Temp:  36.6 C  SpO2: 94% 94%    Last Pain:  Vitals:   11/19/22 1115  TempSrc:   PainSc: 3                  Aerianna Losey P Slayde Brault

## 2022-11-19 NOTE — Transfer of Care (Signed)
Immediate Anesthesia Transfer of Care Note  Patient: ASTHA MUNSEN  Procedure(s) Performed: BREAST REDUCTION WITH LIPOSUCTION (Bilateral: Breast)  Patient Location: PACU  Anesthesia Type:General  Level of Consciousness: awake, alert , drowsy, and patient cooperative  Airway & Oxygen Therapy: Patient Spontanous Breathing and Patient connected to face mask oxygen  Post-op Assessment: Report given to RN  Post vital signs: Reviewed and stable  Last Vitals:  Vitals Value Taken Time  BP 134/53 11/19/22 1015  Temp    Pulse 72 11/19/22 1015  Resp 22 11/19/22 1015  SpO2 94 % 11/19/22 1015  Vitals shown include unvalidated device data.  Last Pain:  Vitals:   11/19/22 0643  TempSrc: Oral  PainSc: 0-No pain      Patients Stated Pain Goal: 4 ([id number])  Complications: No notable events documented.

## 2022-11-19 NOTE — Interval H&P Note (Signed)
History and Physical Interval Note:  11/19/2022 6:59 AM  Laurie Mckinney  has presented today for surgery, with the diagnosis of macromastia.  The various methods of treatment have been discussed with the patient and family. After consideration of risks, benefits and other options for treatment, the patient has consented to  Procedure(s): BREAST REDUCTION WITH LIPOSUCTION (Bilateral) as a surgical intervention.  The patient's history has been reviewed, patient examined, no change in status, stable for surgery.  I have reviewed the patient's chart and labs.  Questions were answered to the patient's satisfaction.     Loel Lofty Evan Osburn

## 2022-11-19 NOTE — Discharge Instructions (Addendum)
INSTRUCTIONS FOR AFTER BREAST SURGERY   You will likely have some questions about what to expect following your operation.  The following information will help you and your family understand what to expect when you are discharged from the hospital.  It is important to follow these guidelines to help ensure a smooth recovery and reduce complication.  Postoperative instructions include information on: diet, wound care, medications and physical activity.  AFTER SURGERY Expect to go home after the procedure.  In some cases, you may need to spend one night in the hospital for observation.  DIET Breast surgery does not require a specific diet.  However, the healthier you eat the better your body will heal. It is important to increasing your protein intake.  This means limiting the foods with sugar and carbohydrates.  Focus on vegetables and some meat.  If you have liposuction during your procedure be sure to drink water.  If your urine is bright yellow, then it is concentrated, and you need to drink more water.  As a general rule after surgery, you should have 8 ounces of water every hour while awake.  If you find you are persistently nauseated or unable to take in liquids let us know.  NO TOBACCO USE or EXPOSURE.  This will slow your healing process and lead to a wound.  WOUND CARE Leave the binder on for 3 days . Use fragrance free soap like Dial, Dove or [place].   After 3 days you can remove the binder to shower. Once dry apply binder or sports bra. If you have liposuction you will have a soft and spongy dressing (Lipofoam) that helps prevent creases in your skin.  Remove before you shower and then replace it.  It is also available on [employer]. If you have steri-strips / tape directly attached to your skin leave them in place. It is OK to get these wet.   No baths, pools or hot tubs for four weeks. We close your incision to leave the smallest and best-looking scar. No ointment or creams on your incisions  for four weeks.  No Neosporin (Too many skin reactions).  A few weeks after surgery you can use Mederma and start massaging the scar. We ask you to wear your binder or sports bra for the first 6 weeks around the clock, including while sleeping. This provides added comfort and helps reduce the fluid accumulation at the surgery site. NO Ice or heating pads to the operative site.  You have a very high risk of a BURN before you feel the temperature change.  ACTIVITY No heavy lifting until cleared by the doctor.  This usually means no more than a half-gallon of milk.  It is OK to walk and climb stairs. Moving your legs is very important to decrease your risk of a blood clot.  It will also help keep you from getting deconditioned.  Every 1 to 2 hours get up and walk for 5 minutes. This will help with a quicker recovery back to normal.  Let pain be your guide so you don't do too much.  This time is for you to recover.  You will be more comfortable if you sleep and rest with your head elevated either with a few pillows under you or in a recliner.  No stomach sleeping for a three months.  WORK Everyone returns to work at different times. As a rough guide, most people take at least 1 - 2 weeks off prior to returning to work. If  you need documentation for your job, give the forms to the front staff at the clinic.  DRIVING Arrange for someone to bring you home from the hospital after your surgery.  You may be able to drive a few days after surgery but not while taking any narcotics or valium.  BOWEL MOVEMENTS Constipation can occur after anesthesia and while taking pain medication.  It is important to stay ahead for your comfort.  We recommend taking Milk of Magnesia (2 tablespoons; twice a day) while taking the pain pills.  MEDICATIONS You may be prescribed should start after surgery At your preoperative visit for you history and physical you may have been given the following medications: An antibiotic: Start  this medication when you get home and take according to the instructions on the bottle. Zofran 4 mg:  This is to treat nausea and vomiting.  You can take this every 6 hours as needed and only if needed. Valium 2 mg for breast cancer patients: This is for muscle tightness if you have an implant or expander. This will help relax your muscle which also helps with pain control.  This can be taken every 12 hours as needed. Don't drive after taking this medication. Norco (hydrocodone/acetaminophen) 5/325 mg:  This is only to be used after you have taken the Motrin or the Tylenol. Every 8 hours as needed.   Over the counter Medication to take: Ibuprofen (Motrin) 600 mg:  Take this every 6 hours.  If you have additional pain then take 500 mg of the Tylenol every 8 hours.  Only take the Norco after you have tried these two. MiraLAX or Milk of Magnesia: Take this according to the bottle if you take the [place] Call your surgeon's office if any of the following occur: Fever 101 degrees F or greater Excessive bleeding or fluid from the incision site. Pain that increases over time without aid from the medications Redness, warmth, or pus draining from incision sites Persistent nausea or inability to take in liquids Severe misshapen area that underwent the operation.   Post Anesthesia Home Care Instructions  Activity: Get plenty of rest for the remainder of the day. A responsible individual must stay with you for 24 hours following the procedure.  For the next 24 hours, DO NOT: -Drive a car -Paediatric nurse -Drink alcoholic beverages -Take any medication unless instructed by your physician -Make any legal decisions or sign important papers.  Meals: Start with liquid foods such as gelatin or soup. Progress to regular foods as tolerated. Avoid greasy, spicy, heavy foods. If nausea and/or vomiting occur, drink only clear liquids until the nausea and/or vomiting subsides. Call your  physician if vomiting continues.  Special Instructions/Symptoms: Your throat may feel dry or sore from the anesthesia or the breathing tube placed in your throat during surgery. If this causes discomfort, gargle with warm salt water. The discomfort should disappear within 24 hours.  If you had a scopolamine patch placed behind your ear for the management of post- operative nausea and/or vomiting:  1. The medication in the patch is effective for 72 hours, after which it should be removed.  Wrap patch in a tissue and discard in the trash. Wash hands thoroughly with soap and water. 2. You may remove the patch earlier than 72 hours if you experience unpleasant side effects which may include dry mouth, dizziness or visual disturbances. 3. Avoid touching the patch. Wash your hands with soap and water after contact with the  patch.  Information for Discharge Teaching: EXPAREL (bupivacaine liposome injectable suspension)   Your surgeon or anesthesiologist gave you EXPAREL(bupivacaine) to help control your pain after surgery.  EXPAREL is a local anesthetic that provides pain relief by numbing the tissue around the surgical site. EXPAREL is designed to release pain medication over time and can control pain for up to 72 hours. Depending on how you respond to EXPAREL, you may require less pain medication during your recovery.  Possible side effects: Temporary loss of sensation or ability to move in the area where bupivacaine was injected. Nausea, vomiting, constipation Rarely, numbness and tingling in your mouth or lips, lightheadedness, or anxiety may occur. Call your doctor right away if you think you may be experiencing any of these sensations, or if you have other questions regarding possible side effects.  Follow all other discharge instructions given to you by your surgeon or nurse. Eat a healthy diet and drink plenty of water or other fluids.  If you return to the hospital for any reason within  96 hours following the administration of EXPAREL, it is important for health care providers to know that you have received this anesthetic. A teal colored band has been placed on your arm with the date, time and amount of EXPAREL you have received in order to alert and inform your health care providers. Please leave this armband in place for the full 96 hours following administration, and then you may remove the band.

## 2022-11-19 NOTE — Anesthesia Procedure Notes (Signed)
Procedure Name: Intubation Date/Time: 11/19/2022 7:37 AM  Performed by: Collier Bullock, CRNAPre-anesthesia Checklist: Patient identified, Emergency Drugs available, Suction available and Patient being monitored Patient Re-evaluated:Patient Re-evaluated prior to induction Oxygen Delivery Method: Circle system utilized Preoxygenation: Pre-oxygenation with 100% oxygen Induction Type: IV induction Ventilation: Mask ventilation without difficulty Laryngoscope Size: Mac and 4 Grade View: Grade I Tube type: Oral Tube size: 7.0 mm Number of attempts: 1 Airway Equipment and Method: Stylet Placement Confirmation: ETT inserted through vocal cords under direct vision, positive ETCO2 and breath sounds checked- equal and bilateral Secured at: 22 cm Tube secured with: Tape Dental Injury: Teeth and Oropharynx as per pre-operative assessment

## 2022-11-20 ENCOUNTER — Encounter (HOSPITAL_BASED_OUTPATIENT_CLINIC_OR_DEPARTMENT_OTHER): Payer: Self-pay | Admitting: Plastic Surgery

## 2022-11-20 LAB — SURGICAL PATHOLOGY

## 2022-11-21 ENCOUNTER — Other Ambulatory Visit: Payer: Self-pay | Admitting: Family Medicine

## 2022-11-27 ENCOUNTER — Encounter: Payer: BC Managed Care – PPO | Admitting: Plastic Surgery

## 2022-11-27 ENCOUNTER — Encounter: Payer: Self-pay | Admitting: Physician Assistant

## 2022-11-27 ENCOUNTER — Ambulatory Visit (INDEPENDENT_AMBULATORY_CARE_PROVIDER_SITE_OTHER): Payer: BC Managed Care – PPO | Admitting: Physician Assistant

## 2022-11-27 VITALS — BP 109/72 | HR 65

## 2022-11-27 DIAGNOSIS — N62 Hypertrophy of breast: Secondary | ICD-10-CM

## 2022-11-27 NOTE — Progress Notes (Signed)
  This is a 62 year old female seen in our office for postop follow-up status post bilateral breast reduction on 11/19/2022 by Dr. Marla Roe.  The patient notes that she has been doing well postoperatively.  She notes expected bruising around the bilateral breasts and ribs.  She notes initially she was quite tender along the lateral ribs but notes this has significantly improved.  She denies any fever.  Chaperone present.  On exam bilateral breasts are round, symmetric, ecchymosis noted bilaterally extending down into the ribs.  Bilateral NAC's are viable, breasts are soft with no palpable fluid collections, no areas of firmness, no warmth to touch.  Dressings are clean dry and intact.  He dressings were removed revealing Steri-Strips in place.    Overall the patient is doing well postoperatively no issues or concerns at today's visit.  The patient will follow-up next week as previously scheduled or sooner as needed.  She verbalized understanding and agreement to today's plan had no further questions or concerns.

## 2022-11-30 ENCOUNTER — Encounter (INDEPENDENT_AMBULATORY_CARE_PROVIDER_SITE_OTHER): Payer: Self-pay | Admitting: Adult Health

## 2022-11-30 ENCOUNTER — Ambulatory Visit (INDEPENDENT_AMBULATORY_CARE_PROVIDER_SITE_OTHER): Payer: BC Managed Care – PPO | Admitting: Adult Health

## 2022-11-30 VITALS — BP 110/68 | HR 56 | Temp 97.8°F | Ht 71.0 in | Wt 210.0 lb

## 2022-11-30 DIAGNOSIS — Z6829 Body mass index (BMI) 29.0-29.9, adult: Secondary | ICD-10-CM

## 2022-11-30 DIAGNOSIS — N6489 Other specified disorders of breast: Secondary | ICD-10-CM | POA: Diagnosis not present

## 2022-11-30 DIAGNOSIS — Z7985 Long-term (current) use of injectable non-insulin antidiabetic drugs: Secondary | ICD-10-CM

## 2022-11-30 DIAGNOSIS — E1169 Type 2 diabetes mellitus with other specified complication: Secondary | ICD-10-CM

## 2022-11-30 DIAGNOSIS — E669 Obesity, unspecified: Secondary | ICD-10-CM

## 2022-11-30 DIAGNOSIS — Z6837 Body mass index (BMI) 37.0-37.9, adult: Secondary | ICD-10-CM | POA: Diagnosis not present

## 2022-11-30 NOTE — Progress Notes (Signed)
Chief Complaint:   OBESITY Laurie Mckinney is here to discuss her progress with her obesity treatment plan along with follow-up of her obesity related diagnoses. Laurie Mckinney is on the Category 3 Plan and states she is following her eating plan approximately 33 % of the time. Laurie Mckinney states she is walking 15-20 minutes 7 times per week.  Today's visit was #: 28 Starting weight: 268 lbs Starting date: 11/14/2020 Today's weight: 210 lbs Today's date: 11/30/2022 Total lbs lost to date: 48 lbs Total lbs lost since last in-office visit: 0  Interim History:  11/19/22 Successful Bilateral Breast Reduction with Liposuction She has not required any narcotic pain medication- discomfort managed with rest and OTC Acetaminophen.  She has been increasing protein at each meal and at snacks.  Reviewed Bioimpedance results with pt: Muscle Mass -1.2 lbs Adipose Mass + 1.2 lbs  She has been unable to exercise or walk dog since breast reduction procedure.  Subjective:   1. Bilateral pendulous breasts 11/19/22 Surgery- BREAST REDUCTION WITH LIPOSUCTION (Bilateral: Breast)  PREOPERATIVE DIAGNOSIS 1. Macromastia 2. Neck Pain 3. Back Pain  POSTOPERATIVE DIAGNOSIS 1. Macromastia 2. Neck Pain 3. Back Pain  PROCEDURES 1. Bilateral breast reduction.  Right reduction 750 g, Left reduction 0000000 g  COMPLICATIONS: None.  DRAINS: none  DESCRIPTION OF PROCEDURE   Patient was brought into the operating room and rested on the operating room table in the supine position.  SCDs were placed and appropriate padding was performed.  Antibiotics were given. The patient underwent general anesthesia and the chest was prepped and draped in a sterile fashion.  A timeout was performed and all information was confirmed to be correct by those in the room.  Tumescent was placed in the lateral breasts and liposuction done for better contouring (left 300, right 300 cc)    11/2322 Plastics OV Notes: This is a 62 year old  female seen in our office for postop follow-up status post bilateral breast reduction on 11/19/2022 by Dr. Marla Roe.   The patient notes that she has been doing well postoperatively.  She notes expected bruising around the bilateral breasts and ribs.  She notes initially she was quite tender along the lateral ribs but notes this has significantly improved.  She denies any fever.   Chaperone present.  On exam bilateral breasts are round, symmetric, ecchymosis noted bilaterally extending down into the ribs.  Bilateral NAC's are viable, breasts are soft with no palpable fluid collections, no areas of firmness, no warmth to touch.  Dressings are clean dry and intact.  He dressings were removed revealing Steri-Strips in place.  2. Type 2 diabetes mellitus with other specified complication, without long-term current use of insulin (HCC) Lab Results  Component Value Date   HGBA1C 5.8 (H) 10/29/2022   HGBA1C 5.9 (H) 08/03/2022   HGBA1C 5.5 03/09/2022  Patient is currently on Ozempic 1 mg- only SE is continued constipation, denies abdominal pain or hematochezia. Fasting blood sugars <130.  She is taking OTC milk of magnesium several days per week.  She held Ozempic '1mg'$  week of bilateral breast reduction- she has since resumed injection therapy.   Assessment/Plan:   1. Bilateral pendulous breasts Continue post op care per Plastics  2. Type 2 diabetes mellitus with other specified complication, without long-term current use of insulin (HCC) Continue weekly Ozempic '1mg'$ - does not require refill today. Check lbas  3. Obesity with current BMI 29.4  Laurie Mckinney is currently in the action stage of change. As such, her goal is to  continue with weight loss efforts. She has agreed to the Category 3 Plan.   Check Labs  Exercise goals: All adults should avoid inactivity. Some physical activity is better than none, and adults who participate in any amount of physical activity gain some health  benefits.  Behavioral modification strategies: increasing lean protein intake, decreasing simple carbohydrates, increasing vegetables, increasing water intake, no skipping meals, meal planning and cooking strategies, and planning for success.  Laurie Mckinney has agreed to follow-up with our clinic in 4 weeks. She was informed of the importance of frequent follow-up visits to maximize her success with intensive lifestyle modifications for her multiple health conditions.   Teniya was informed we would discuss her lab results at her next visit unless there is a critical issue that needs to be addressed sooner. Laurie Mckinney agreed to keep her next visit at the agreed upon time to discuss these results.  Objective:   Blood pressure 110/68, pulse (!) 56, temperature 97.8 F (36.6 C), height '5\' 11"'$  (1.803 m), weight 210 lb (95.3 kg), last menstrual period 01/26/2011, SpO2 99 %. Body mass index is 29.29 kg/m.  General: Cooperative, alert, well developed, in no acute distress. HEENT: Conjunctivae and lids unremarkable. Cardiovascular: Regular rhythm.  Lungs: Normal work of breathing. Neurologic: No focal deficits.   Lab Results  Component Value Date   CREATININE 0.72 11/16/2022   BUN 17 11/16/2022   NA 137 11/16/2022   K 4.5 11/16/2022   CL 102 11/16/2022   CO2 27 11/16/2022   Lab Results  Component Value Date   ALT 18 03/09/2022   AST 22 03/09/2022   ALKPHOS 77 03/09/2022   BILITOT 0.5 03/09/2022   Lab Results  Component Value Date   HGBA1C 5.8 (H) 10/29/2022   HGBA1C 5.9 (H) 08/03/2022   HGBA1C 5.5 03/09/2022   HGBA1C 5.8 (A) 12/18/2021   HGBA1C 5.8 (A) 06/20/2021   Lab Results  Component Value Date   INSULIN 9.7 05/13/2021   INSULIN 4.0 11/14/2020   Lab Results  Component Value Date   TSH 0.724 11/14/2020   Lab Results  Component Value Date   CHOL 108 03/09/2022   HDL 62 03/09/2022   LDLCALC 37 03/09/2022   TRIG 27 03/09/2022   CHOLHDL 1.7 03/09/2022   Lab Results   Component Value Date   VD25OH 40.5 11/14/2020   VD25OH 35 08/14/2011   Lab Results  Component Value Date   WBC 8.0 03/09/2022   HGB 13.6 03/09/2022   HCT 40.1 03/09/2022   MCV 91 03/09/2022   PLT 271 03/09/2022   No results found for: "IRON", "TIBC", "FERRITIN"  Attestation Statements:   Reviewed by clinician on day of visit: allergies, medications, problem list, medical history, surgical history, family history, social history, and previous encounter notes.  I have reviewed the above documentation for accuracy and completeness, and I agree with the above. -  Fable Huisman d. Tiasha Helvie, NP-C

## 2022-12-01 LAB — VITAMIN D 25 HYDROXY (VIT D DEFICIENCY, FRACTURES): Vit D, 25-Hydroxy: 29.8 ng/mL — ABNORMAL LOW (ref 30.0–100.0)

## 2022-12-01 LAB — INSULIN, RANDOM: INSULIN: 12.6 u[IU]/mL (ref 2.6–24.9)

## 2022-12-02 NOTE — Progress Notes (Signed)
Patient is a 62 year old female who recently underwent bilateral breast reduction with Dr. Marla Roe on 11/19/2022.  Intraoperatively, she had 750 g removed bilaterally. She is approximately 2.5 weeks postop.  Patient presents to the clinic today for postop follow-up.  Patient was last seen in the clinic on 11/27/2022.  At this visit, patient reported she was doing well.  She reported she was having some bruising around the bilateral breast and ribs.  She also reported she was having some tenderness but stated it had been improving.  On exam, there is ecchymosis noted bilaterally extending down to the ribs.  Bilateral NAC's were viable.  There were no palpable fluid collections on exam.  Steri-Strips were in place over the incision.  Patient was found to be doing well postoperatively.  Plan was for patient to follow-up at her next scheduled visit.  Today, patient reports she is doing well.  She states that she sometimes has a little bit of soreness around the incision, but overall her pain is very controlled.  She denies any drainage from the incision.  She denies any fevers or chills.   Chaperone present on exam.  On exam, patient is sitting upright in no acute distress.  Breasts are soft and symmetric bilaterally.  There is no overlying erythema.  There are no palpable fluid collections on exam.  There is some mild firmness noted to the lateral aspects of the breast bilaterally and near the incisions consistent with scarring.  NAC's are viable bilaterally.  Steri-Strips are in place over the incisions.  Steri-Strips removed.  Incisions overall are intact and healing well.  There is a small 0.5 cm x 0.5 cm superficial wound at the left inferior T-zone.  There is no surrounding erythema or drainage.  There were several suture knots noted.  These were cut and removed.  Patient tolerated well.  I discussed with the patient that she should apply Vaseline daily to her incisions.  I discussed with her to also  gently massage her incisions where the firmness is and the lateral aspects of her breasts where the scarring is.  Patient expressed understanding.  I discussed with the patient to continue wearing compression at all times.  I recommended to the patient to not wear underwire.  I discussed with her that she should have a sports bra that sits or clips in the front.  I also discussed with the patient that she should avoid any strenuous activities.  Patient expressed understanding.  Plan is for patient to follow-up at her next scheduled appointment in a few weeks.  I instructed the patient to call in the meantime if she has any questions or concerns.

## 2022-12-07 ENCOUNTER — Ambulatory Visit (INDEPENDENT_AMBULATORY_CARE_PROVIDER_SITE_OTHER): Payer: BC Managed Care – PPO | Admitting: Student

## 2022-12-07 DIAGNOSIS — N62 Hypertrophy of breast: Secondary | ICD-10-CM

## 2022-12-17 ENCOUNTER — Encounter: Payer: BC Managed Care – PPO | Admitting: Student

## 2022-12-21 ENCOUNTER — Encounter: Payer: BC Managed Care – PPO | Admitting: Student

## 2022-12-25 ENCOUNTER — Ambulatory Visit (INDEPENDENT_AMBULATORY_CARE_PROVIDER_SITE_OTHER): Payer: BC Managed Care – PPO | Admitting: Student

## 2022-12-25 VITALS — BP 126/80 | HR 61

## 2022-12-25 DIAGNOSIS — N62 Hypertrophy of breast: Secondary | ICD-10-CM

## 2022-12-25 NOTE — Progress Notes (Signed)
Patient is a 62 year old female who underwent bilateral breast reduction with Dr. Marla Roe on 11/19/2022.  She is 5 weeks postop.  Patient presents to the clinic today for follow-up.  Patient was last seen in the clinic on 12/07/2022.  At this visit, patient reported she was doing well.  On exam, breasts are soft and symmetric bilaterally.  There is no overlying erythema.  There were no fluid collections palpated on exam.  There was some mild firmness noted to the lateral aspects of the breast bilaterally and near the incision site was consistent with scarring.  NAC's are viable bilaterally.  Incisions overall were intact and healing well.  There is a small 0.5 cm x 0.5 cm superficial wound at the left inferior T-zone.  Plan was for patient to apply Vaseline to her incisions daily and to gently massage where the firmness was.  Plan was for patient to follow-up at her scheduled appointment.  Today, patient reports she is doing well.  She denies any new complaints, issues or concerns.  She denies any fevers or chills.  Patient reports she has been applying Vaseline daily to her incisions.  Patient states overall she is very happy with her result.  Chaperone present on exam.  On exam, patient is sitting upright in no acute distress.  Breasts are soft and fairly symmetric. There is no overlying erythema or ecchymosis to either breast.  NAC's are viable bilaterally.  There is 1 small area of irritation noted to the lateral aspect of the right NAC, and 2 small areas of irritation noted to the medial aspect of the left NAC.  There is no drainage or swelling.  Incisions are otherwise intact and healing well.  There are no signs of infection on exam.  There is some firmness underneath the incisions that is consistent with scarring.  I discussed with the patient that given she is 5 weeks postop, she should continue wearing compression and avoid strenuous activities for about another week or so.  I discussed with her  that after that, she can transition to regular bras without underwire.  I discussed with her she can also begin gradually increasing her activities at that time.  Patient expressed understanding.  I discussed with the patient that she should apply Vaseline to her incisions for another 2 to 3 weeks or so.  I discussed with her once the areas of irritation have completely resolved, she may start using scar cream such as Mederma, Silagen or [place].  Patient expressed understanding.  Patient to follow-up as needed.  I instructed the patient to call if she has any questions or concerns about anything.  Pictures were obtained of the patient and placed in the chart with the patient's or guardian's permission.

## 2022-12-28 ENCOUNTER — Encounter (INDEPENDENT_AMBULATORY_CARE_PROVIDER_SITE_OTHER): Payer: Self-pay | Admitting: Adult Health

## 2022-12-28 ENCOUNTER — Ambulatory Visit (INDEPENDENT_AMBULATORY_CARE_PROVIDER_SITE_OTHER): Payer: BC Managed Care – PPO | Admitting: Adult Health

## 2022-12-28 VITALS — BP 123/76 | HR 77 | Temp 97.6°F | Ht 71.0 in | Wt 213.0 lb

## 2022-12-28 DIAGNOSIS — Z6829 Body mass index (BMI) 29.0-29.9, adult: Secondary | ICD-10-CM

## 2022-12-28 DIAGNOSIS — N6489 Other specified disorders of breast: Secondary | ICD-10-CM

## 2022-12-28 DIAGNOSIS — E1169 Type 2 diabetes mellitus with other specified complication: Secondary | ICD-10-CM | POA: Diagnosis not present

## 2022-12-28 DIAGNOSIS — Z7985 Long-term (current) use of injectable non-insulin antidiabetic drugs: Secondary | ICD-10-CM

## 2022-12-28 DIAGNOSIS — E669 Obesity, unspecified: Secondary | ICD-10-CM

## 2022-12-28 NOTE — Progress Notes (Signed)
WEIGHT SUMMARY AND BIOMETRICS  Vitals Temp: 97.6 F (36.4 C) BP: 123/76 Pulse Rate: 77 SpO2: 100 %   Anthropometric Measurements Height: 5\' 11"  (1.803 m) Weight: 213 lb (96.6 kg) BMI (Calculated): 29.72 Weight at Last Visit: 210lb Weight Lost Since Last Visit: 0 Weight Gained Since Last Visit: 3lb Starting Weight: 268lb Total Weight Loss (lbs): 45 lb (20.4 kg)   Body Composition  Body Fat %: 40.8 % Fat Mass (lbs): 87.2 lbs Muscle Mass (lbs): 120 lbs Total Body Water (lbs): 86.8 lbs Visceral Fat Rating : 11   Other Clinical Data Fasting: No Labs: No Today's Visit #: 29 Starting Date: 11/14/20    Chief Complaint:   OBESITY Laurie Mckinney is here to discuss her progress with her obesity treatment plan. She is on the the Category 3 Plan and states she is following her eating plan approximately 35 % of the time.  She states she is exercising walking 30 minutes 7 times per week.   Interim History:  11/19/2022 BILATERAL BREAST REDUCTION WITH LIPOSUCTION Last OV with Plastics was 12/25/2022- she has been released without restrictions.  Subjective:   1. Type 2 diabetes mellitus with other specified complication, without long-term current use of insulin (HCC) Discussed Labs Lab Results  Component Value Date   HGBA1C 5.8 (H) 10/29/2022   HGBA1C 5.9 (H) 08/03/2022   HGBA1C 5.5 03/09/2022    Latest Reference Range & Units 11/30/22 13:03  INSULIN 2.6 - 24.9 uIU/mL 12.6    Currently on Ozempic 1mg  once weekly injection. Denies mass in neck, dysphagia, dyspepsia, persistent hoarseness, abdominal pain, or N/V/C   2. Bilateral pendulous breasts 11/19/2022 BILATERAL BREAST REDUCTION WITH LIPOSUCTION Last OV with Plastics 12/25/2022 OV: Patient was last seen in the clinic on 12/07/2022. At this visit, patient reported she was doing well. On exam, breasts are soft and symmetric bilaterally. There is no overlying erythema. There were no fluid collections palpated on exam.  There was some mild firmness noted to the lateral aspects of the breast bilaterally and near the incision site was consistent with scarring. NAC's are viable bilaterally. Incisions overall were intact and healing well. There is a small 0.5 cm x 0.5 cm superficial wound at the left inferior T-zone. Plan was for patient to apply Vaseline to her incisions daily and to gently massage where the firmness was. Plan was for patient to follow-up at her scheduled appointment.  I discussed with the patient that given she is 5 weeks postop, she should continue wearing compression and avoid strenuous activities for about another week or so.  I discussed with her that after that, she can transition to regular bras without underwire.  I discussed with her she can also begin gradually increasing her activities at that time.  Patient expressed understanding.   I discussed with the patient that she should apply Vaseline to her incisions for another 2 to 3 weeks or so.  I discussed with her once the areas of irritation have completely resolved, she may start using scar cream such as Mederma, Silagen or [place].  Patient expressed understanding.   Patient to follow-up as needed.  3. Vit D Def Discussed Labs  Latest Reference Range & Units 11/30/22 13:03  Vitamin D, 25-Hydroxy 30.0 - 100.0 ng/mL 29.8 (L)  (L): Data is abnormally low Assessment/Plan:   1. Type 2 diabetes mellitus with other specified complication, without long-term current use of insulin (HCC) Increase compliance on Cat 3 meal plan to at least 80%  2. Bilateral  pendulous breasts Advance activity as tolerated.  3. Vit D Def Start weekly Ergocalciferol 50,000 IU once week Disp 4 RF 0  4. Obesity with current BMI 29.72  Laurie Mckinney is currently in the action stage of change. As such, her goal is to continue with weight loss efforts. She has agreed to the Category 3 Plan.   Exercise goals: All adults should avoid inactivity. Some physical activity is  better than none, and adults who participate in any amount of physical activity gain some health benefits. For additional and more extensive health benefits, adults should increase their aerobic physical activity to 300 minutes (5 hours) a week of moderate-intensity, or 150 minutes a week of vigorous-intensity aerobic physical activity, or an equivalent combination of moderate- and vigorous-intensity activity. Additional health benefits are gained by engaging in physical activity beyond this amount.  Adults should also include muscle-strengthening activities that involve all major muscle groups on 2 or more days a week.  Behavioral modification strategies: increasing lean protein intake, decreasing simple carbohydrates, increasing vegetables, increasing water intake, no skipping meals, and planning for success.  Laurie Mckinney has agreed to follow-up with our clinic in 4 weeks. She was informed of the importance of frequent follow-up visits to maximize her success with intensive lifestyle modifications for her multiple health conditions.   Objective:   Blood pressure 123/76, pulse 77, temperature 97.6 F (36.4 C), height 5\' 11"  (1.803 m), weight 213 lb (96.6 kg), last menstrual period 01/26/2011, SpO2 100 %. Body mass index is 29.71 kg/m.  General: Cooperative, alert, well developed, in no acute distress. HEENT: Conjunctivae and lids unremarkable. Cardiovascular: Regular rhythm.  Lungs: Normal work of breathing. Neurologic: No focal deficits.   Lab Results  Component Value Date   CREATININE 0.72 11/16/2022   BUN 17 11/16/2022   NA 137 11/16/2022   K 4.5 11/16/2022   CL 102 11/16/2022   CO2 27 11/16/2022   Lab Results  Component Value Date   ALT 18 03/09/2022   AST 22 03/09/2022   ALKPHOS 77 03/09/2022   BILITOT 0.5 03/09/2022   Lab Results  Component Value Date   HGBA1C 5.8 (H) 10/29/2022   HGBA1C 5.9 (H) 08/03/2022   HGBA1C 5.5 03/09/2022   HGBA1C 5.8 (A) 12/18/2021   HGBA1C 5.8  (A) 06/20/2021   Lab Results  Component Value Date   INSULIN 12.6 11/30/2022   INSULIN 9.7 05/13/2021   INSULIN 4.0 11/14/2020   Lab Results  Component Value Date   TSH 0.724 11/14/2020   Lab Results  Component Value Date   CHOL 108 03/09/2022   HDL 62 03/09/2022   LDLCALC 37 03/09/2022   TRIG 27 03/09/2022   CHOLHDL 1.7 03/09/2022   Lab Results  Component Value Date   VD25OH 29.8 (L) 11/30/2022   VD25OH 40.5 11/14/2020   VD25OH 35 08/14/2011   Lab Results  Component Value Date   WBC 8.0 03/09/2022   HGB 13.6 03/09/2022   HCT 40.1 03/09/2022   MCV 91 03/09/2022   PLT 271 03/09/2022   No results found for: "IRON", "TIBC", "FERRITIN"  Attestation Statements:   Reviewed by clinician on day of visit: allergies, medications, problem list, medical history, surgical history, family history, social history, and previous encounter notes.  I have reviewed the above documentation for accuracy and completeness, and I agree with the above. -  Mykia Holton d. Jenevie Casstevens, NP-C

## 2022-12-29 ENCOUNTER — Other Ambulatory Visit (INDEPENDENT_AMBULATORY_CARE_PROVIDER_SITE_OTHER): Payer: Self-pay | Admitting: Adult Health

## 2022-12-29 ENCOUNTER — Encounter (INDEPENDENT_AMBULATORY_CARE_PROVIDER_SITE_OTHER): Payer: Self-pay | Admitting: Adult Health

## 2022-12-29 MED ORDER — VITAMIN D (ERGOCALCIFEROL) 1.25 MG (50000 UNIT) PO CAPS: 50000.0000 [IU] | ORAL_CAPSULE | ORAL | 0 refills | Status: DC

## 2023-01-13 DIAGNOSIS — R350 Frequency of micturition: Secondary | ICD-10-CM | POA: Diagnosis not present

## 2023-01-13 DIAGNOSIS — N3946 Mixed incontinence: Secondary | ICD-10-CM | POA: Diagnosis not present

## 2023-01-16 ENCOUNTER — Other Ambulatory Visit (INDEPENDENT_AMBULATORY_CARE_PROVIDER_SITE_OTHER): Payer: Self-pay | Admitting: Adult Health

## 2023-01-19 DIAGNOSIS — E1142 Type 2 diabetes mellitus with diabetic polyneuropathy: Secondary | ICD-10-CM | POA: Diagnosis not present

## 2023-01-19 DIAGNOSIS — M216X1 Other acquired deformities of right foot: Secondary | ICD-10-CM | POA: Diagnosis not present

## 2023-01-19 DIAGNOSIS — L84 Corns and callosities: Secondary | ICD-10-CM | POA: Diagnosis not present

## 2023-01-19 DIAGNOSIS — M216X2 Other acquired deformities of left foot: Secondary | ICD-10-CM | POA: Diagnosis not present

## 2023-01-20 DIAGNOSIS — B351 Tinea unguium: Secondary | ICD-10-CM | POA: Diagnosis not present

## 2023-01-25 ENCOUNTER — Ambulatory Visit (INDEPENDENT_AMBULATORY_CARE_PROVIDER_SITE_OTHER): Payer: BC Managed Care – PPO | Admitting: Adult Health

## 2023-01-25 DIAGNOSIS — E1136 Type 2 diabetes mellitus with diabetic cataract: Secondary | ICD-10-CM | POA: Insufficient documentation

## 2023-01-25 DIAGNOSIS — H35033 Hypertensive retinopathy, bilateral: Secondary | ICD-10-CM | POA: Insufficient documentation

## 2023-01-25 DIAGNOSIS — H2513 Age-related nuclear cataract, bilateral: Secondary | ICD-10-CM | POA: Insufficient documentation

## 2023-02-01 ENCOUNTER — Ambulatory Visit (INDEPENDENT_AMBULATORY_CARE_PROVIDER_SITE_OTHER): Payer: BC Managed Care – PPO | Admitting: Adult Health

## 2023-02-01 ENCOUNTER — Encounter (INDEPENDENT_AMBULATORY_CARE_PROVIDER_SITE_OTHER): Payer: Self-pay | Admitting: Adult Health

## 2023-02-01 VITALS — BP 121/73 | HR 59 | Temp 98.2°F | Ht 71.0 in | Wt 211.0 lb

## 2023-02-01 DIAGNOSIS — E669 Obesity, unspecified: Secondary | ICD-10-CM | POA: Diagnosis not present

## 2023-02-01 DIAGNOSIS — E1169 Type 2 diabetes mellitus with other specified complication: Secondary | ICD-10-CM | POA: Diagnosis not present

## 2023-02-01 DIAGNOSIS — E559 Vitamin D deficiency, unspecified: Secondary | ICD-10-CM | POA: Diagnosis not present

## 2023-02-01 DIAGNOSIS — Z6829 Body mass index (BMI) 29.0-29.9, adult: Secondary | ICD-10-CM

## 2023-02-01 DIAGNOSIS — Z7985 Long-term (current) use of injectable non-insulin antidiabetic drugs: Secondary | ICD-10-CM

## 2023-02-01 MED ORDER — VITAMIN D (ERGOCALCIFEROL) 1.25 MG (50000 UNIT) PO CAPS: 50000.0000 [IU] | ORAL_CAPSULE | ORAL | 0 refills | Status: DC

## 2023-02-01 NOTE — Progress Notes (Signed)
WEIGHT SUMMARY AND BIOMETRICS  Vitals Temp: 98.2 F (36.8 C) BP: 121/73 Pulse Rate: (!) 59 SpO2: 100 %   Anthropometric Measurements Height: 5\' 11"  (1.803 m) Weight: 211 lb (95.7 kg) BMI (Calculated): 29.44 Weight at Last Visit: 213lb Weight Lost Since Last Visit: 2lb Weight Gained Since Last Visit: 0 Starting Weight: 268lb Total Weight Loss (lbs): 47 lb (21.3 kg)   Body Composition  Body Fat %: 40.9 % Fat Mass (lbs): 86.6 lbs Muscle Mass (lbs): 118.8 lbs Total Body Water (lbs): 88.6 lbs Visceral Fat Rating : 11   Other Clinical Data Fasting: no Labs: no Today's Visit #: 30 Starting Date: 11/14/20    Chief Complaint:   OBESITY Laurie Mckinney is here to discuss her progress with her obesity treatment plan. She is on the the Category 3 Plan and states she is following her eating plan approximately 40 % of the time.  She states she is exercising Walking 10-15 minutes 7 times per week.   Interim History:  Plastic Surgeon has fully released Laurie Mckinney- no restrictions! Current Cup Size-42 D, previous 46DDD She denies any current back or neck pain She states " I don't have that heaviness up top".  Interval Health Goals: 1) Increase activity level 2) Loss another 15 lbs, current weight 211 lbs (196 lbs)  Of Note- She has annual physical with Dr. Lemar Livings June 2024  Subjective:   1. Vitamin D deficiency  Latest Reference Range & Units 11/30/22 13:03  Vitamin D, 25-Hydroxy 30.0 - 100.0 ng/mL 29.8 (L)  (L): Data is abnormally low She started on Ergocalciferol- 12/28/2022 She denies N/V/Muscle Weakness She denies increase in energy levels yet.  2. Type 2 diabetes mellitus with other specified complication, without long-term current use of insulin (HCC) Lab Results  Component Value Date   HGBA1C 5.8 (H) 10/29/2022   HGBA1C 5.9 (H) 08/03/2022   HGBA1C 5.5 03/09/2022   She is currently on weekly Ozempic 1mg  Denies mass in neck, dysphagia, dyspepsia,  persistent hoarseness, abdominal pain, or N/V/C  She has 3 month at home.  Assessment/Plan:   1. Vitamin D deficiency Refill  Vitamin D, Ergocalciferol, (DRISDOL) 1.25 MG (50000 UNIT) CAPS capsule Take 1 capsule (50,000 Units total) by mouth every 7 (seven) days. Dispense: 4 capsule, Refills: 0 ordered   2. Type 2 diabetes mellitus with other specified complication, without long-term current use of insulin (HCC) Continue weekly Ozempic 1mg   3. Obesity with current BMI 29.44  Laurie Mckinney is currently in the action stage of change. As such, her goal is to continue with weight loss efforts. She has agreed to the Category 3 Plan.   Exercise goals: All adults should avoid inactivity. Some physical activity is better than none, and adults who participate in any amount of physical activity gain some health benefits. Adults should also include muscle-strengthening activities that involve all major muscle groups on 2 or more days a week.  Behavioral modification strategies: increasing lean protein intake, decreasing simple carbohydrates, increasing vegetables, increasing water intake, decreasing eating out, no skipping meals, meal planning and cooking strategies, better snacking choices, and planning for success.  Laurie Mckinney has agreed to follow-up with our clinic in 4 weeks. She was informed of the importance of frequent follow-up visits to maximize her success with intensive lifestyle modifications for her multiple health conditions.   Objective:   Blood pressure 121/73, pulse (!) 59, temperature 98.2 F (36.8 C), height 5\' 11"  (1.803 m), weight 211 lb (95.7 kg), last menstrual period 01/26/2011,  SpO2 100 %. Body mass index is 29.43 kg/m.  General: Cooperative, alert, well developed, in no acute distress. HEENT: Conjunctivae and lids unremarkable. Cardiovascular: Regular rhythm.  Lungs: Normal work of breathing. Neurologic: No focal deficits.   Lab Results  Component Value Date   CREATININE  0.72 11/16/2022   BUN 17 11/16/2022   NA 137 11/16/2022   K 4.5 11/16/2022   CL 102 11/16/2022   CO2 27 11/16/2022   Lab Results  Component Value Date   ALT 18 03/09/2022   AST 22 03/09/2022   ALKPHOS 77 03/09/2022   BILITOT 0.5 03/09/2022   Lab Results  Component Value Date   HGBA1C 5.8 (H) 10/29/2022   HGBA1C 5.9 (H) 08/03/2022   HGBA1C 5.5 03/09/2022   HGBA1C 5.8 (A) 12/18/2021   HGBA1C 5.8 (A) 06/20/2021   Lab Results  Component Value Date   INSULIN 12.6 11/30/2022   INSULIN 9.7 05/13/2021   INSULIN 4.0 11/14/2020   Lab Results  Component Value Date   TSH 0.724 11/14/2020   Lab Results  Component Value Date   CHOL 108 03/09/2022   HDL 62 03/09/2022   LDLCALC 37 03/09/2022   TRIG 27 03/09/2022   CHOLHDL 1.7 03/09/2022   Lab Results  Component Value Date   VD25OH 29.8 (L) 11/30/2022   VD25OH 40.5 11/14/2020   VD25OH 35 08/14/2011   Lab Results  Component Value Date   WBC 8.0 03/09/2022   HGB 13.6 03/09/2022   HCT 40.1 03/09/2022   MCV 91 03/09/2022   PLT 271 03/09/2022   No results found for: "IRON", "TIBC", "FERRITIN"  Attestation Statements:   Reviewed by clinician on day of visit: allergies, medications, problem list, medical history, surgical history, family history, social history, and previous encounter notes.  I have reviewed the above documentation for accuracy and completeness, and I agree with the above. -  Mathilde Mcwherter d. Soul Hackman, NP-C

## 2023-02-06 ENCOUNTER — Other Ambulatory Visit: Payer: Self-pay | Admitting: Family Medicine

## 2023-02-06 DIAGNOSIS — F341 Dysthymic disorder: Secondary | ICD-10-CM

## 2023-02-16 ENCOUNTER — Encounter: Payer: Self-pay | Admitting: Cardiology

## 2023-02-16 ENCOUNTER — Ambulatory Visit: Payer: BC Managed Care – PPO | Attending: Cardiology | Admitting: Cardiology

## 2023-02-16 VITALS — BP 138/78 | HR 63 | Ht 71.0 in | Wt 219.4 lb

## 2023-02-16 DIAGNOSIS — I4891 Unspecified atrial fibrillation: Secondary | ICD-10-CM

## 2023-02-16 DIAGNOSIS — I10 Essential (primary) hypertension: Secondary | ICD-10-CM | POA: Diagnosis not present

## 2023-02-16 DIAGNOSIS — D6869 Other thrombophilia: Secondary | ICD-10-CM | POA: Diagnosis not present

## 2023-02-16 DIAGNOSIS — E782 Mixed hyperlipidemia: Secondary | ICD-10-CM | POA: Diagnosis not present

## 2023-02-16 NOTE — Progress Notes (Signed)
Clinical Summary Ms. Deist is a 62 y.o.female seen today for follow up of the following medical problems.    1. AFib/Aflutter - prior issues with afib management, did not tolerate even rate controlled afib  - had afib ablation Jan 2021   - mild nonspecific palpitations. - no bleeding on xarelto.      2. SOB/DOE 06/2019 echo LVEF 55-60%, mild LAE - symptoms have resolved since her afib ablation -denies any symptoms   3. Chest pain   01/2015 GXT no ischemic changes.  - no recent chest pains         4. OSA - on cpap, followed by [facility]     5.HTN - she is compliant with meds -    6. DM2  -glycemic control per pcp - she is on statin given history of DM2 - 11/2020 TC 135 TG 45 HDL 62 LDL 62     SH: retired 2 years, worked for [employer] over 25 years.  [facility]. Has a puppy pit mix    Past Medical History:  Diagnosis Date   Adjustment insomnia    SHIFT WORK   Allergy    RHINITIS   Asthma    Atrial flutter (HCC) 02/26/2015   [facility]   Back pain    Bursitis    right hip   Cataract    see last eye eexam note    Constipation    Depression    Diabetes mellitus without complication (HCC)    History of atrial fibrillation    History of cancer    Hypertension    Incontinence    Joint pain    Migraine headache    Mobitz type 2 second degree heart block    Neuropathy    Obesity    Other fatigue    Shortness of breath on exertion    Sleep apnea      No Known Allergies   Current Outpatient Medications  Medication Sig Dispense Refill   acetaminophen (TYLENOL) 650 MG CR tablet Take 650 mg by mouth every 8 (eight) hours as needed for pain.     atorvastatin (LIPITOR) 10 MG tablet TAKE 1 TABLET DAILY 90 tablet 2   buPROPion (WELLBUTRIN SR) 200 MG 12 hr tablet TAKE 1 TABLET BY MOUTH EVERY DAY 90 tablet 0   GEMTESA 75 MG TABS Take 1 tablet by mouth daily.     Lancets (ONETOUCH DELICA PLUS LANCET33G) MISC  USE AND DISCARD 1 LANCET   DAILY 100 each 3   lisinopril-hydrochlorothiazide (ZESTORETIC) 20-25 MG tablet TAKE 1 TABLET DAILY (DOSE  INCREASE) 90 tablet 1   Misc Natural Products (IMMUNE FORMULA PO) Take 2 tablets by mouth daily. immuneti     Multiple Vitamins-Minerals (CENTRUM SILVER 50+WOMEN) TABS Take 1 tablet by mouth daily.     ONETOUCH VERIO test strip USE AND DISCARD 1 TEST     STRIP AS NEEDED AS         INSTRUCTED 100 each 3   oxybutynin (DITROPAN-XL) 10 MG 24 hr tablet TAKE 1 TABLET AT BEDTIME 90 tablet 1   PARoxetine (PAXIL) 20 MG tablet TAKE 1 TABLET BY MOUTH EVERY DAY 90 tablet 0   rivaroxaban (XARELTO) 20 MG TABS tablet TAKE 1 TABLET BY MOUTH DAILY WITH SUPPER 90 tablet 0   Semaglutide, 1 MG/DOSE, 4 MG/3ML SOPN Inject 1 mg as directed once a week. 9 mL 0   terbinafine (LAMISIL) 250 MG tablet Take 250  mg by mouth daily.     Vitamin D, Ergocalciferol, (DRISDOL) 1.25 MG (50000 UNIT) CAPS capsule Take 1 capsule (50,000 Units total) by mouth every 7 (seven) days. 4 capsule 0   UNABLE TO FIND Take 1 tablet by mouth Nightly. Med Name: Angus Seller (Patient not taking: Reported on 02/16/2023)     No current facility-administered medications for this visit.     Past Surgical History:  Procedure Laterality Date   ABDOMINAL HYSTERECTOMY     ATRIAL FIBRILLATION ABLATION N/A 10/27/2019   Procedure: ATRIAL FIBRILLATION ABLATION;  Surgeon: Regan Lemming, MD;  Location: [facility];  Service: Cardiovascular;  Laterality: N/A;   BREAST REDUCTION SURGERY Bilateral 11/19/2022   Procedure: BREAST REDUCTION WITH LIPOSUCTION;  Surgeon: Peggye Form, DO;  Location: [facility];  Service: Plastics;  Laterality: Bilateral;   CARDIOVERSION N/A 05/25/2019   Procedure: CARDIOVERSION;  Surgeon: Antoine Poche, MD;  Location: [facility];  Service: Endoscopy;  Laterality: N/A;   COLONOSCOPY  2011   Dr.Brodie   ROBOTIC ASSISTED TOTAL HYSTERECTOMY WITH BILATERAL SALPINGO  OOPHERECTOMY Bilateral 02/05/2015   Procedure: ROBOTIC ASSISTED TOTAL LAPAROSCOPIC HYSTERECTOMY WITH BILATERAL SALPINGO OOPHORECTOMY;  Surgeon: Adolphus Birchwood, MD;  Location: [facility];  Service: Gynecology;  Laterality: Bilateral;     No Known Allergies    Family History  Problem Relation Age of Onset   Arthritis Mother    Heart disease Father    Hypertension Father    Hyperlipidemia Father    Sudden death Father    Neuropathy Brother        both legs    Diabetes Brother        no treatment that the pt knows of    Other Brother        drank a 6 pack of beer daily x 30 years    Sleep apnea Neg Hx      Social History Ms. Desmith reports that she has never smoked. She has never used smokeless tobacco. Ms. Cooke reports that she does not currently use alcohol after a past usage of about 1.0 standard drink of alcohol per week.   Review of Systems CONSTITUTIONAL: No weight loss, fever, chills, weakness or fatigue.  HEENT: Eyes: No visual loss, blurred vision, double vision or yellow sclerae.No hearing loss, sneezing, congestion, runny nose or sore throat.  SKIN: No rash or itching.  CARDIOVASCULAR: per hpi RESPIRATORY: No shortness of breath, cough or sputum.  GASTROINTESTINAL: No anorexia, nausea, vomiting or diarrhea. No abdominal pain or blood.  GENITOURINARY: No burning on urination, no polyuria NEUROLOGICAL: No headache, dizziness, syncope, paralysis, ataxia, numbness or tingling in the extremities. No change in bowel or bladder control.  MUSCULOSKELETAL: No muscle, back pain, joint pain or stiffness.  LYMPHATICS: No enlarged nodes. No history of splenectomy.  PSYCHIATRIC: No history of depression or anxiety.  ENDOCRINOLOGIC: No reports of sweating, cold or heat intolerance. No polyuria or polydipsia.  Marland Kitchen   Physical Examination Today's Vitals   02/16/23 1557 02/16/23 1622  BP: (!) 146/70 138/78  Pulse: 63   SpO2: 95%   Weight: 219 lb 6.4 oz (99.5 kg)   Height: 5\' 11"   (1.803 m)    Body mass index is 30.6 kg/m.  Gen: resting comfortably, no acute distress HEENT: no scleral icterus, pupils equal round and reactive, no palptable cervical adenopathy,  CV: RRR, no mrg, no jvd Resp: Clear to auscultation bilaterally GI: abdomen is soft, non-tender, non-distended, normal bowel sounds, no hepatosplenomegaly MSK: extremities  are warm, no edema.  Skin: warm, no rash Neuro:  no focal deficits Psych: appropriate affect   Diagnostic Studies  06/2019 echo IMPRESSIONS      1. The left ventricle has normal systolic function, with an ejection fraction of 55-60%. The cavity size was normal. Left ventricular diastolic Doppler parameters are indeterminate.  2. The right ventricle has normal systolic function. The cavity was normal. There is no increase in right ventricular wall thickness.  3. Left atrial size was mildly dilated.  4. No evidence of mitral valve stenosis.  5. The aortic valve has an indeterminate number of cusps. No stenosis of the aortic valve.  6. The aorta is normal unless otherwise noted.  7. The aortic root is normal in size and structure.  8. Pulmonary hypertension is indeterminant, inadequate TR jet.  9. The interatrial septum was not well visualized.     Assessment and Plan   1. Afib/acquired thrombophilia - s/p ablation - no significant symptoms - EKG today shows SR first degree av block - continue current meds including eliquis for stroke prevention   2.HTN - mildly elevated here but 5 clinic appts since Feb bp well controlled - continue current meds  3. Hyperlipidemia - at goal, continue current meds     F/u  1 year   F/u 1 year  Antoine Poche, M.D.

## 2023-02-16 NOTE — Patient Instructions (Signed)

## 2023-02-22 ENCOUNTER — Other Ambulatory Visit (INDEPENDENT_AMBULATORY_CARE_PROVIDER_SITE_OTHER): Payer: Self-pay | Admitting: Adult Health

## 2023-02-23 DIAGNOSIS — G609 Hereditary and idiopathic neuropathy, unspecified: Secondary | ICD-10-CM | POA: Diagnosis not present

## 2023-02-23 DIAGNOSIS — B351 Tinea unguium: Secondary | ICD-10-CM | POA: Diagnosis not present

## 2023-02-23 DIAGNOSIS — E1142 Type 2 diabetes mellitus with diabetic polyneuropathy: Secondary | ICD-10-CM | POA: Diagnosis not present

## 2023-02-23 DIAGNOSIS — L84 Corns and callosities: Secondary | ICD-10-CM | POA: Diagnosis not present

## 2023-02-26 ENCOUNTER — Other Ambulatory Visit (INDEPENDENT_AMBULATORY_CARE_PROVIDER_SITE_OTHER): Payer: Self-pay | Admitting: Adult Health

## 2023-02-26 DIAGNOSIS — E1169 Type 2 diabetes mellitus with other specified complication: Secondary | ICD-10-CM

## 2023-03-02 ENCOUNTER — Other Ambulatory Visit: Payer: Self-pay | Admitting: Family Medicine

## 2023-03-02 ENCOUNTER — Ambulatory Visit (INDEPENDENT_AMBULATORY_CARE_PROVIDER_SITE_OTHER): Payer: BC Managed Care – PPO | Admitting: Adult Health

## 2023-03-02 ENCOUNTER — Encounter (INDEPENDENT_AMBULATORY_CARE_PROVIDER_SITE_OTHER): Payer: Self-pay | Admitting: Adult Health

## 2023-03-02 VITALS — BP 109/71 | HR 67 | Temp 98.2°F | Ht 71.0 in | Wt 210.0 lb

## 2023-03-02 DIAGNOSIS — E1169 Type 2 diabetes mellitus with other specified complication: Secondary | ICD-10-CM

## 2023-03-02 DIAGNOSIS — Z6829 Body mass index (BMI) 29.0-29.9, adult: Secondary | ICD-10-CM

## 2023-03-02 DIAGNOSIS — E785 Hyperlipidemia, unspecified: Secondary | ICD-10-CM

## 2023-03-02 DIAGNOSIS — E669 Obesity, unspecified: Secondary | ICD-10-CM | POA: Diagnosis not present

## 2023-03-02 DIAGNOSIS — E559 Vitamin D deficiency, unspecified: Secondary | ICD-10-CM | POA: Diagnosis not present

## 2023-03-02 DIAGNOSIS — Z7985 Long-term (current) use of injectable non-insulin antidiabetic drugs: Secondary | ICD-10-CM

## 2023-03-02 MED ORDER — VITAMIN D (ERGOCALCIFEROL) 1.25 MG (50000 UNIT) PO CAPS: 50000.0000 [IU] | ORAL_CAPSULE | ORAL | 0 refills | Status: DC

## 2023-03-02 MED ORDER — SEMAGLUTIDE (1 MG/DOSE) 4 MG/3ML ~~LOC~~ SOPN: 1.0000 mg | PEN_INJECTOR | SUBCUTANEOUS | 0 refills | Status: DC

## 2023-03-02 NOTE — Progress Notes (Signed)
WEIGHT SUMMARY AND BIOMETRICS  Vitals Temp: 98.2 F (36.8 C) BP: 109/71 Pulse Rate: 67 SpO2: 97 %   Anthropometric Measurements Height: 5\' 11"  (1.803 m) Weight: 210 lb (95.3 kg) BMI (Calculated): 29.3 Weight at Last Visit: 211lb Weight Lost Since Last Visit: 1lb Weight Gained Since Last Visit: 0 Starting Weight: 268lb Total Weight Loss (lbs): 48 lb (21.8 kg)   Body Composition  Body Fat %: 40.1 % Fat Mass (lbs): 84.6 lbs Muscle Mass (lbs): 119.8 lbs Total Body Water (lbs): 86.8 lbs Visceral Fat Rating : 10   Other Clinical Data Fasting: no Labs: no Today's Visit #: 31 Starting Date: 11/14/20    Chief Complaint:   OBESITY Laurie Mckinney is here to discuss her progress with her obesity treatment plan. She is on the the Category 3 Plan and states she is following her eating plan approximately 65 % of the time. She states she is being active daily.   Interim History:  11/19/22-  BREAST REDUCTION WITH LIPOSUCTION (Bilateral: Breast)   Since she had bilateral breast reduction, 46DDD/F reduced to 42D She reports resolution of cervical and thoracic back pain.  Hydration-she drinks several glasses of sweet tea, trying to increase plain water intake.  Reviewed Bioempedence results with pt: Muscle Mass: + 1 lb Adipose Mass: - 2 lbs  Subjective:   1. Type 2 diabetes mellitus with other specified complication, without long-term current use of insulin (HCC) Lab Results  Component Value Date   HGBA1C 5.8 (H) 10/29/2022   HGBA1C 5.9 (H) 08/03/2022   HGBA1C 5.5 03/09/2022   She reports fasting CBG 120s She denies sx's of hypoglycemia Denies mass in neck, dysphagia, dyspepsia, persistent hoarseness, abdominal pain, or N/V/C  She is on weekly Ozempic 1mg   Latest Reference Range & Units 11/30/22 13:03  INSULIN 2.6 - 24.9 uIU/mL 12.6   2. Vitamin D deficiency  Latest Reference Range & Units 11/30/22 13:03  Vitamin D, 25-Hydroxy 30.0 - 100.0 ng/mL 29.8 (L)  (L):  Data is abnormally low She is on weekly Ergocalciferol- denies N/V/Muscle Weaknes  Assessment/Plan:   1. Type 2 diabetes mellitus with other specified complication, without long-term current use of insulin (HCC) Refill - Semaglutide, 1 MG/DOSE, 4 MG/3ML SOPN; Inject 1 mg as directed once a week.  Dispense: 9 mL; Refill: 0 Check labs at next OV  2. Vitamin D deficiency Refill- requests 90 day supply   Vitamin D, Ergocalciferol, (DRISDOL) 1.25 MG (50000 UNIT) CAPS capsule Take 1 capsule (50,000 Units total) by mouth every 7 (seven) days. Dispense: 12 capsule, Refills: 0 ordered   Check labs at nexyt OV  3. Obesity, Starting BMI 37.38  [name] is currently in the action stage of change. As such, her goal is to continue with weight loss efforts. She has agreed to the Category 3 Plan.   Exercise goals: For substantial health benefits, adults should do at least 150 minutes (2 hours and 30 minutes) a week of moderate-intensity, or 75 minutes (1 hour and 15 minutes) a week of vigorous-intensity aerobic physical activity, or an equivalent combination of moderate- and vigorous-intensity aerobic activity. Aerobic activity should be performed in episodes of at least 10 minutes, and preferably, it should be spread throughout the week.  Resume regular walking.  Dinner goal- 450- 600 cal/40g+ protein  Behavioral modification strategies: increasing lean protein intake, decreasing simple carbohydrates, increasing vegetables, increasing water intake, no skipping meals, meal planning and cooking strategies, keeping healthy foods in the home, and planning for success.  Orba has agreed to follow-up with our clinic in 4 weeks. She was informed of the importance of frequent follow-up visits to maximize her success with intensive lifestyle modifications for her multiple health conditions.   Check Fasting Labs at next OV  Objective:   Blood pressure 109/71, pulse 67, temperature 98.2 F (36.8 C),  height 5\' 11"  (1.803 m), weight 210 lb (95.3 kg), last menstrual period 01/26/2011, SpO2 97 %. Body mass index is 29.29 kg/m.  General: Cooperative, alert, well developed, in no acute distress. HEENT: Conjunctivae and lids unremarkable. Cardiovascular: Regular rhythm.  Lungs: Normal work of breathing. Neurologic: No focal deficits.   Lab Results  Component Value Date   CREATININE 0.72 11/16/2022   BUN 17 11/16/2022   NA 137 11/16/2022   K 4.5 11/16/2022   CL 102 11/16/2022   CO2 27 11/16/2022   Lab Results  Component Value Date   ALT 18 03/09/2022   AST 22 03/09/2022   ALKPHOS 77 03/09/2022   BILITOT 0.5 03/09/2022   Lab Results  Component Value Date   HGBA1C 5.8 (H) 10/29/2022   HGBA1C 5.9 (H) 08/03/2022   HGBA1C 5.5 03/09/2022   HGBA1C 5.8 (A) 12/18/2021   HGBA1C 5.8 (A) 06/20/2021   Lab Results  Component Value Date   INSULIN 12.6 11/30/2022   INSULIN 9.7 05/13/2021   INSULIN 4.0 11/14/2020   Lab Results  Component Value Date   TSH 0.724 11/14/2020   Lab Results  Component Value Date   CHOL 108 03/09/2022   HDL 62 03/09/2022   LDLCALC 37 03/09/2022   TRIG 27 03/09/2022   CHOLHDL 1.7 03/09/2022   Lab Results  Component Value Date   VD25OH 29.8 (L) 11/30/2022   VD25OH 40.5 11/14/2020   VD25OH 35 08/14/2011   Lab Results  Component Value Date   WBC 8.0 03/09/2022   HGB 13.6 03/09/2022   HCT 40.1 03/09/2022   MCV 91 03/09/2022   PLT 271 03/09/2022   No results found for: "IRON", "TIBC", "FERRITIN"  Attestation Statements:   Reviewed by clinician on day of visit: allergies, medications, problem list, medical history, surgical history, family history, social history, and previous encounter notes.  I have reviewed the above documentation for accuracy and completeness, and I agree with the above. -  Maceo Hernan d. Giavanna Kang, NP-C

## 2023-03-22 ENCOUNTER — Encounter: Payer: BC Managed Care – PPO | Admitting: Family Medicine

## 2023-03-22 ENCOUNTER — Other Ambulatory Visit (INDEPENDENT_AMBULATORY_CARE_PROVIDER_SITE_OTHER): Payer: Self-pay | Admitting: Adult Health

## 2023-03-22 DIAGNOSIS — E559 Vitamin D deficiency, unspecified: Secondary | ICD-10-CM

## 2023-03-22 DIAGNOSIS — E1169 Type 2 diabetes mellitus with other specified complication: Secondary | ICD-10-CM

## 2023-03-22 NOTE — Telephone Encounter (Signed)
LAST APPOINTMENT DATE: 03/02/2023 NEXT APPOINTMENT DATE: 04/12/2023   [id number] - [place], [place] - [address] AT [facility] [address] [place] [address] Phone: [phone] Fax: [phone]  [facility] - [place], [place] - [facility] AT Portal to Registered [facility] [address] [place] [address] Phone: [phone] Fax: [phone]  Patient is requesting a refill of the following medications: Requested Prescriptions   Pending Prescriptions Disp Refills   Semaglutide, 1 MG/DOSE, 4 MG/3ML SOPN 9 mL 0    Sig: Inject 1 mg as directed once a week.   Vitamin D, Ergocalciferol, (DRISDOL) 1.25 MG (50000 UNIT) CAPS capsule 12 capsule 0    Sig: Take 1 capsule (50,000 Units total) by mouth every 7 (seven) days.    Date last filled: 03/02/2023 Previously prescribed by William Hamburger  Lab Results  Component Value Date   HGBA1C 5.8 (H) 10/29/2022   HGBA1C 5.9 (H) 08/03/2022   HGBA1C 5.5 03/09/2022   Lab Results  Component Value Date   MICROALBUR <5.0 03/09/2022   LDLCALC 37 03/09/2022   CREATININE 0.72 11/16/2022   Lab Results  Component Value Date   VD25OH 29.8 (L) 11/30/2022   VD25OH 40.5 11/14/2020   VD25OH 35 08/14/2011    BP Readings from Last 3 Encounters:  03/02/23 109/71  02/16/23 138/78  02/01/23 121/73

## 2023-03-23 ENCOUNTER — Ambulatory Visit: Payer: BC Managed Care – PPO | Admitting: Family Medicine

## 2023-03-23 ENCOUNTER — Encounter: Payer: Self-pay | Admitting: Family Medicine

## 2023-03-23 VITALS — BP 108/72 | HR 66 | Temp 97.9°F | Resp 14 | Ht 70.5 in | Wt 213.6 lb

## 2023-03-23 DIAGNOSIS — E559 Vitamin D deficiency, unspecified: Secondary | ICD-10-CM

## 2023-03-23 DIAGNOSIS — E1159 Type 2 diabetes mellitus with other circulatory complications: Secondary | ICD-10-CM | POA: Diagnosis not present

## 2023-03-23 DIAGNOSIS — I4819 Other persistent atrial fibrillation: Secondary | ICD-10-CM | POA: Diagnosis not present

## 2023-03-23 DIAGNOSIS — Z0000 Encounter for general adult medical examination without abnormal findings: Secondary | ICD-10-CM | POA: Diagnosis not present

## 2023-03-23 DIAGNOSIS — Z9889 Other specified postprocedural states: Secondary | ICD-10-CM

## 2023-03-23 DIAGNOSIS — I152 Hypertension secondary to endocrine disorders: Secondary | ICD-10-CM | POA: Diagnosis not present

## 2023-03-23 DIAGNOSIS — Z1211 Encounter for screening for malignant neoplasm of colon: Secondary | ICD-10-CM

## 2023-03-23 DIAGNOSIS — E1142 Type 2 diabetes mellitus with diabetic polyneuropathy: Secondary | ICD-10-CM

## 2023-03-23 DIAGNOSIS — G4733 Obstructive sleep apnea (adult) (pediatric): Secondary | ICD-10-CM | POA: Diagnosis not present

## 2023-03-23 DIAGNOSIS — J301 Allergic rhinitis due to pollen: Secondary | ICD-10-CM

## 2023-03-23 DIAGNOSIS — F341 Dysthymic disorder: Secondary | ICD-10-CM

## 2023-03-23 DIAGNOSIS — H2513 Age-related nuclear cataract, bilateral: Secondary | ICD-10-CM

## 2023-03-23 DIAGNOSIS — I441 Atrioventricular block, second degree: Secondary | ICD-10-CM | POA: Diagnosis not present

## 2023-03-23 DIAGNOSIS — E669 Obesity, unspecified: Secondary | ICD-10-CM

## 2023-03-23 DIAGNOSIS — E1140 Type 2 diabetes mellitus with diabetic neuropathy, unspecified: Secondary | ICD-10-CM | POA: Diagnosis not present

## 2023-03-23 DIAGNOSIS — D6869 Other thrombophilia: Secondary | ICD-10-CM

## 2023-03-23 DIAGNOSIS — N393 Stress incontinence (female) (male): Secondary | ICD-10-CM

## 2023-03-23 DIAGNOSIS — E1169 Type 2 diabetes mellitus with other specified complication: Secondary | ICD-10-CM | POA: Diagnosis not present

## 2023-03-23 DIAGNOSIS — E785 Hyperlipidemia, unspecified: Secondary | ICD-10-CM | POA: Diagnosis not present

## 2023-03-23 DIAGNOSIS — H35033 Hypertensive retinopathy, bilateral: Secondary | ICD-10-CM

## 2023-03-23 LAB — POCT URINALYSIS DIP (CLINITEK)
Bilirubin, UA: NEGATIVE
Blood, UA: NEGATIVE
Glucose, UA: NEGATIVE mg/dL
Ketones, POC UA: NEGATIVE mg/dL
Leukocytes, UA: NEGATIVE
Nitrite, UA: NEGATIVE
POC PROTEIN,UA: NEGATIVE
Spec Grav, UA: 1.01 (ref 1.010–1.025)
Urobilinogen, UA: 0.2 E.U./dL
pH, UA: 6 (ref 5.0–8.0)

## 2023-03-23 LAB — POCT GLYCOSYLATED HEMOGLOBIN (HGB A1C): Hemoglobin A1C: 5.8 % — AB (ref 4.0–5.6)

## 2023-03-23 MED ORDER — ATORVASTATIN CALCIUM 10 MG PO TABS: 10.0000 mg | ORAL_TABLET | Freq: Every day | ORAL | 0 refills | Status: DC

## 2023-03-23 MED ORDER — PAROXETINE HCL 20 MG PO TABS: 20.0000 mg | ORAL_TABLET | Freq: Every day | ORAL | 0 refills | Status: DC

## 2023-03-23 MED ORDER — BUPROPION HCL ER (SR) 200 MG PO TB12: 200.0000 mg | ORAL_TABLET | Freq: Every day | ORAL | 0 refills | Status: DC

## 2023-03-23 MED ORDER — LISINOPRIL-HYDROCHLOROTHIAZIDE 20-25 MG PO TABS: 1.0000 | ORAL_TABLET | Freq: Every day | ORAL | 3 refills | Status: DC

## 2023-03-23 NOTE — Progress Notes (Signed)
Complete physical exam  Patient: Laurie Mckinney   DOB: March 21, 1961   62 y.o. Female  MRN: [id number]  Subjective:    Chief Complaint  Patient presents with   Annual Exam    Fasting. Has additional concerns.    Laurie Mckinney is a 62 y.o. female who presents today for a complete physical exam. She reports consuming a general diet. The patient does not participate in regular exercise at present. She generally feels fairly well. She reports sleeping poorly. She  Continues on medical weight loss and wellness and has lost approximately 58 pounds over the entire time she has been on this.  She has made some diet and exercise changes.  She does have reflux disease and has been using OTC meds for this.  She continues on 2 medications for her OAB and this is going well.  She does see podiatry and they are given her Lamisil for her onychomycosis.  She has had recent breast reduction surgery.  She continues on Xarelto for her atrial for but does follow-up regularly with cardiology.  She also has side to be heart block.  She does have OSA and continues on her CPAP.  Her allergies seem to be under good control.  She also does have a history of diabetes and sees be handling this quite nicely.  She continues on Paxil and Wellbutrin and finds this quite helpful to maintain the status quo.  She has noted some slight memory loss over the last several months.  She follows up regularly with ophthalmology concerning her cataracts.  Most recent fall risk assessment:    02/10/2021    9:38 AM  Fall Risk   Falls in the past year? 0  Number falls in past yr: 0  Injury with Fall? 0  Risk for fall due to : No Fall Risks  Follow up Falls evaluation completed     Most recent depression screenings:    03/23/2023   11:10 AM 03/09/2022    9:25 AM  PHQ 2/9 Scores  PHQ - 2 Score 0 0  PHQ- 9 Score 1 3    Vision:Within last year and Dental: Receives regular dental care    Patient Care Team: Ronnald Nian,  MD as PCP - General (Family Medicine) Wyline Mood, Dorothe Pea, MD as PCP - Cardiology (Cardiology) Regan Lemming, MD as PCP - Electrophysiology (Cardiology)   Outpatient Medications Prior to Visit  Medication Sig   acetaminophen (TYLENOL) 650 MG CR tablet Take 650 mg by mouth every 8 (eight) hours as needed for pain.   GEMTESA 75 MG TABS Take 1 tablet by mouth daily.   Lancets (ONETOUCH DELICA PLUS LANCET33G) MISC USE AND DISCARD 1 LANCET   DAILY   Misc Natural Products (IMMUNE FORMULA PO) Take 2 tablets by mouth daily. immuneti   Multiple Vitamins-Minerals (CENTRUM SILVER 50+WOMEN) TABS Take 1 tablet by mouth daily.   ONETOUCH VERIO test strip USE AND DISCARD 1 TEST     STRIP AS NEEDED AS         INSTRUCTED   oxybutynin (DITROPAN-XL) 10 MG 24 hr tablet TAKE 1 TABLET AT BEDTIME   rivaroxaban (XARELTO) 20 MG TABS tablet TAKE 1 TABLET BY MOUTH DAILY WITH SUPPER   Semaglutide, 1 MG/DOSE, 4 MG/3ML SOPN Inject 1 mg as directed once a week.   terbinafine (LAMISIL) 250 MG tablet Take 250 mg by mouth daily.   UNABLE TO FIND Take 1 tablet by mouth Nightly. Med Name: Angus Seller  Vitamin D, Ergocalciferol, (DRISDOL) 1.25 MG (50000 UNIT) CAPS capsule Take 1 capsule (50,000 Units total) by mouth every 7 (seven) days.   [DISCONTINUED] atorvastatin (LIPITOR) 10 MG tablet TAKE 1 TABLET DAILY   [DISCONTINUED] buPROPion (WELLBUTRIN SR) 200 MG 12 hr tablet TAKE 1 TABLET BY MOUTH EVERY DAY   [DISCONTINUED] lisinopril-hydrochlorothiazide (ZESTORETIC) 20-25 MG tablet TAKE 1 TABLET DAILY (DOSE  INCREASE)   [DISCONTINUED] PARoxetine (PAXIL) 20 MG tablet TAKE 1 TABLET BY MOUTH EVERY DAY   No facility-administered medications prior to visit.    Review of Systems  All other systems reviewed and are negative.         Objective:  Alert and in no distress. Tympanic membranes and canals are normal. Pharyngeal area is normal. Neck is supple without adenopathy or thyromegaly. Cardiac exam shows a regular sinus  rhythm without murmurs or gallops. Lungs are clear to auscultation.  Exam of her toenails does show an improvement in that at approximately 50%. Hemoglobin A1c is 5.8   BP 108/72   Pulse 66   Temp 97.9 F (36.6 C) (Oral)   Resp 14   Ht 5' 10.5" (1.791 m)   Wt 213 lb 9.6 oz (96.9 kg)   LMP 01/26/2011   SpO2 99%   BMI 30.22 kg/m    Physical Exam   Results for orders placed or performed in visit on 03/23/23  POCT URINALYSIS DIP (CLINITEK)  Result Value Ref Range   Color, UA yellow yellow   Clarity, UA clear clear   Glucose, UA negative negative mg/dL   Bilirubin, UA negative negative   Ketones, POC UA negative negative mg/dL   Spec Grav, UA 8.295 6.213 - 1.025   Blood, UA negative negative   pH, UA 6.0 5.0 - 8.0   POC PROTEIN,UA negative negative, trace   Urobilinogen, UA 0.2 0.2 or 1.0 E.U./dL   Nitrite, UA Negative Negative   Leukocytes, UA Negative Negative  POCT glycosylated hemoglobin (Hb A1C)  Result Value Ref Range   Hemoglobin A1C 5.8 (A) 4.0 - 5.6 %       Assessment & Plan:    Routine general medical examination at a health care facility - Plan: POCT URINALYSIS DIP (CLINITEK)  Second degree heart block  Hypertension associated with diabetes (HCC) - Plan: lisinopril-hydrochlorothiazide (ZESTORETIC) 20-25 MG tablet, Comprehensive metabolic panel, CBC with Differential/Platelet  Persistent atrial fibrillation (HCC)  OSA on CPAP  Allergic rhinitis due to pollen, unspecified seasonality  Hyperlipidemia associated with type 2 diabetes mellitus (HCC) - Plan: atorvastatin (LIPITOR) 10 MG tablet, Lipid panel  Diabetic polyneuropathy associated with type 2 diabetes mellitus (HCC)  Type 2 diabetes mellitus with diabetic neuropathy, without long-term current use of insulin (HCC) - Plan: POCT glycosylated hemoglobin (Hb A1C)  Secondary hypercoagulable state (HCC)  Obesity (BMI 30-39.9)  Hypertensive retinopathy of both eyes  Dysthymia - Plan: buPROPion  (WELLBUTRIN SR) 200 MG 12 hr tablet, PARoxetine (PAXIL) 20 MG tablet  Age-related nuclear cataract of both eyes  Class 2 severe obesity with serious comorbidity and body mass index (BMI) of 37.0 to 37.9 in adult, unspecified obesity type (HCC)  Vitamin D insufficiency - Plan: VITAMIN D 25 Hydroxy (Vit-D Deficiency, Fractures)  Screening for colon cancer - Plan: Cologuard  H/O bilateral breast reduction surgery  Female stress incontinence  Immunization History  Administered Date(s) Administered   Influenza Inj Mdck Quad Pf 06/19/2018, 06/28/2022   Influenza Split 06/05/2012, 05/24/2013, 06/15/2019   Influenza Whole 07/16/2008, 06/05/2009, 07/05/2010   Influenza,inj,Quad PF,6+ Mos 06/07/2017,  06/15/2019, 06/20/2021   Influenza-Unspecified 06/05/2013, 06/19/2014, 06/21/2015, 05/15/2016, 06/07/2017, 06/19/2018, 06/15/2019, 08/14/2020   MMR 03/06/1969   Moderna SARS-COV2 Booster Vaccination 02/10/2021   Moderna Sars-Covid-2 Vaccination 08/14/2020, 02/10/2021   PFIZER(Purple Top)SARS-COV-2 Vaccination 12/28/2019, 01/20/2020   Pneumococcal Conjugate-13 06/15/2019   Pneumococcal Polysaccharide-23 02/10/2021   Td 03/04/2005   Tdap 08/24/2012, 09/04/2013   Unspecified SARS-COV-2 Vaccination 07/01/2022   Zoster Recombinat (Shingrix) 05/05/2018, 07/19/2018    Health Maintenance  Topic Date Due   COVID-19 Vaccine (6 - 2023-24 season) 08/26/2022   Fecal DNA (Cologuard)  11/06/2022   Diabetic kidney evaluation - Urine ACR  03/10/2023   FOOT EXAM  03/10/2023   INFLUENZA VACCINE  05/06/2023   DTaP/Tdap/Td (4 - Td or Tdap) 09/05/2023   HEMOGLOBIN A1C  09/22/2023   Diabetic kidney evaluation - eGFR measurement  11/17/2023   OPHTHALMOLOGY EXAM  01/25/2024   MAMMOGRAM  03/13/2024   Hepatitis C Screening  Completed   HIV Screening  Completed   Zoster Vaccines- Shingrix  Completed   HPV VACCINES  Aged Out   PAP SMEAR-Modifier  Discontinued   Colonoscopy  Discontinued    Discussed health  benefits of physical activity, and encouraged her to engage in regular exercise appropriate for her age and condition.  Encouraged her to continue on her present medications.  Recommend she does use Prilosec to help with her reflux symptoms.  Continue with medical weight loss and wellness.  Congratulated her on the good work that she is doing.  She will follow-up with podiatry concerning the onychomycosis.  Discussed memory loss with her and at this point we will continue to monitor this.  Problem List Items Addressed This Visit     Allergic rhinitis due to pollen (Chronic)   Obesity (BMI 30-39.9) (Chronic)   Age-related nuclear cataract of both eyes   Atrial fibrillation (HCC)   Relevant Medications   atorvastatin (LIPITOR) 10 MG tablet   lisinopril-hydrochlorothiazide (ZESTORETIC) 20-25 MG tablet   Class 2 severe obesity with serious comorbidity and body mass index (BMI) of 37.0 to 37.9 in adult (HCC)   Diabetes mellitus (HCC)   Relevant Medications   atorvastatin (LIPITOR) 10 MG tablet   lisinopril-hydrochlorothiazide (ZESTORETIC) 20-25 MG tablet   Other Relevant Orders   POCT glycosylated hemoglobin (Hb A1C) (Completed)   Diabetic neuropathy (HCC)   Relevant Medications   atorvastatin (LIPITOR) 10 MG tablet   lisinopril-hydrochlorothiazide (ZESTORETIC) 20-25 MG tablet   Dysthymia   Relevant Medications   buPROPion (WELLBUTRIN SR) 200 MG 12 hr tablet   PARoxetine (PAXIL) 20 MG tablet   Female stress incontinence   H/O bilateral breast reduction surgery   Hyperlipidemia associated with type 2 diabetes mellitus (HCC)   Relevant Medications   atorvastatin (LIPITOR) 10 MG tablet   lisinopril-hydrochlorothiazide (ZESTORETIC) 20-25 MG tablet   Other Relevant Orders   Lipid panel   Hypertension associated with diabetes (HCC)   Relevant Medications   atorvastatin (LIPITOR) 10 MG tablet   lisinopril-hydrochlorothiazide (ZESTORETIC) 20-25 MG tablet   Other Relevant Orders    Comprehensive metabolic panel   CBC with Differential/Platelet   Hypertensive retinopathy of both eyes   OSA on CPAP   Second degree heart block   Relevant Medications   atorvastatin (LIPITOR) 10 MG tablet   lisinopril-hydrochlorothiazide (ZESTORETIC) 20-25 MG tablet   Secondary hypercoagulable state (HCC)   Other Visit Diagnoses     Routine general medical examination at a health care facility    -  Primary   Relevant Orders  POCT URINALYSIS DIP (CLINITEK) (Completed)   Vitamin D insufficiency       Relevant Orders   VITAMIN D 25 Hydroxy (Vit-D Deficiency, Fractures)   Screening for colon cancer       Relevant Orders   Cologuard      No follow-ups on file.     Sharlot Gowda, MD

## 2023-03-24 LAB — LIPID PANEL
Chol/HDL Ratio: 1.6 ratio (ref 0.0–4.4)
Cholesterol, Total: 121 mg/dL (ref 100–199)
HDL: 75 mg/dL (ref >39)
LDL Chol Calc (NIH): 36 mg/dL (ref 0–99)
Triglycerides: 38 mg/dL (ref 0–149)
VLDL Cholesterol Cal: 10 mg/dL (ref 5–40)

## 2023-03-24 LAB — CBC WITH DIFFERENTIAL/PLATELET
Basophils Absolute: 0 10*3/uL (ref 0.0–0.2)
Basos: 1 %
EOS (ABSOLUTE): 0.1 10*3/uL (ref 0.0–0.4)
Eos: 1 %
Hematocrit: 41.7 % (ref 34.0–46.6)
Hemoglobin: 13.8 g/dL (ref 11.1–15.9)
Immature Grans (Abs): 0 10*3/uL (ref 0.0–0.1)
Immature Granulocytes: 0 %
Lymphocytes Absolute: 1.9 10*3/uL (ref 0.7–3.1)
Lymphs: 32 %
MCH: 30.2 pg (ref 26.6–33.0)
MCHC: 33.1 g/dL (ref 31.5–35.7)
MCV: 91 fL (ref 79–97)
Monocytes Absolute: 0.6 10*3/uL (ref 0.1–0.9)
Monocytes: 9 %
Neutrophils Absolute: 3.4 10*3/uL (ref 1.4–7.0)
Neutrophils: 57 %
Platelets: 249 10*3/uL (ref 150–450)
RBC: 4.57 x10E6/uL (ref 3.77–5.28)
RDW: 12.3 % (ref 11.7–15.4)
WBC: 5.9 10*3/uL (ref 3.4–10.8)

## 2023-03-24 LAB — COMPREHENSIVE METABOLIC PANEL
ALT: 18 IU/L (ref 0–32)
AST: 21 IU/L (ref 0–40)
Albumin: 4.3 g/dL (ref 3.9–4.9)
Alkaline Phosphatase: 87 IU/L (ref 44–121)
BUN/Creatinine Ratio: 18 (ref 12–28)
BUN: 13 mg/dL (ref 8–27)
Bilirubin Total: 0.5 mg/dL (ref 0.0–1.2)
CO2: 25 mmol/L (ref 20–29)
Calcium: 9.6 mg/dL (ref 8.7–10.3)
Chloride: 104 mmol/L (ref 96–106)
Creatinine, Ser: 0.73 mg/dL (ref 0.57–1.00)
Globulin, Total: 2.8 g/dL (ref 1.5–4.5)
Glucose: 87 mg/dL (ref 70–99)
Potassium: 4.3 mmol/L (ref 3.5–5.2)
Sodium: 141 mmol/L (ref 134–144)
Total Protein: 7.1 g/dL (ref 6.0–8.5)
eGFR: 92 mL/min/{1.73_m2} (ref >59)

## 2023-03-24 LAB — VITAMIN D 25 HYDROXY (VIT D DEFICIENCY, FRACTURES): Vit D, 25-Hydroxy: 68.8 ng/mL (ref 30.0–100.0)

## 2023-04-09 ENCOUNTER — Other Ambulatory Visit: Payer: Self-pay | Admitting: Family Medicine

## 2023-04-09 DIAGNOSIS — I152 Hypertension secondary to endocrine disorders: Secondary | ICD-10-CM

## 2023-04-11 ENCOUNTER — Other Ambulatory Visit (INDEPENDENT_AMBULATORY_CARE_PROVIDER_SITE_OTHER): Payer: Self-pay | Admitting: Adult Health

## 2023-04-11 DIAGNOSIS — E1169 Type 2 diabetes mellitus with other specified complication: Secondary | ICD-10-CM

## 2023-04-12 ENCOUNTER — Ambulatory Visit (INDEPENDENT_AMBULATORY_CARE_PROVIDER_SITE_OTHER): Payer: BC Managed Care – PPO | Admitting: Adult Health

## 2023-04-18 DIAGNOSIS — Z1211 Encounter for screening for malignant neoplasm of colon: Secondary | ICD-10-CM | POA: Diagnosis not present

## 2023-04-25 LAB — COLOGUARD: COLOGUARD: POSITIVE — AB

## 2023-04-26 ENCOUNTER — Encounter: Payer: Self-pay | Admitting: Family Medicine

## 2023-04-26 ENCOUNTER — Other Ambulatory Visit: Payer: Self-pay | Admitting: Family Medicine

## 2023-04-26 ENCOUNTER — Other Ambulatory Visit: Payer: Self-pay

## 2023-04-26 DIAGNOSIS — Z1211 Encounter for screening for malignant neoplasm of colon: Secondary | ICD-10-CM

## 2023-04-29 ENCOUNTER — Encounter (INDEPENDENT_AMBULATORY_CARE_PROVIDER_SITE_OTHER): Payer: Self-pay | Admitting: Adult Health

## 2023-04-29 ENCOUNTER — Ambulatory Visit (INDEPENDENT_AMBULATORY_CARE_PROVIDER_SITE_OTHER): Payer: BC Managed Care – PPO | Admitting: Adult Health

## 2023-04-29 VITALS — BP 99/66 | HR 63 | Temp 97.8°F | Ht 71.0 in | Wt 210.0 lb

## 2023-04-29 DIAGNOSIS — E1169 Type 2 diabetes mellitus with other specified complication: Secondary | ICD-10-CM | POA: Diagnosis not present

## 2023-04-29 DIAGNOSIS — E559 Vitamin D deficiency, unspecified: Secondary | ICD-10-CM | POA: Diagnosis not present

## 2023-04-29 DIAGNOSIS — Z7985 Long-term (current) use of injectable non-insulin antidiabetic drugs: Secondary | ICD-10-CM

## 2023-04-29 DIAGNOSIS — E1140 Type 2 diabetes mellitus with diabetic neuropathy, unspecified: Secondary | ICD-10-CM

## 2023-04-29 DIAGNOSIS — E669 Obesity, unspecified: Secondary | ICD-10-CM | POA: Diagnosis not present

## 2023-04-29 DIAGNOSIS — Z6829 Body mass index (BMI) 29.0-29.9, adult: Secondary | ICD-10-CM

## 2023-04-29 MED ORDER — SEMAGLUTIDE (1 MG/DOSE) 4 MG/3ML ~~LOC~~ SOPN
1.0000 mg | PEN_INJECTOR | SUBCUTANEOUS | 0 refills | Status: DC
Start: 2023-04-29 — End: 2023-06-14

## 2023-04-29 MED ORDER — VITAMIN D (ERGOCALCIFEROL) 1.25 MG (50000 UNIT) PO CAPS: 50000.0000 [IU] | ORAL_CAPSULE | ORAL | 0 refills | Status: DC

## 2023-04-29 NOTE — Progress Notes (Signed)
WEIGHT SUMMARY AND BIOMETRICS  Vitals Temp: 97.8 F (36.6 C) BP: 99/66 Pulse Rate: 63 SpO2: 98 %   Anthropometric Measurements Height: 5\' 11"  (1.803 m) Weight: 210 lb (95.3 kg) BMI (Calculated): 29.3 Weight at Last Visit: 210lb Weight Lost Since Last Visit: 0 Weight Gained Since Last Visit: 0 Starting Weight: 268lb Total Weight Loss (lbs): 48 lb (21.8 kg)   Body Composition  Body Fat %: 39.4 % Fat Mass (lbs): 83 lbs Muscle Mass (lbs): 121.4 lbs Total Body Water (lbs): 86.2 lbs Visceral Fat Rating : 10   Other Clinical Data Fasting: No Labs: Yes Today's Visit #: 32 Starting Date: 11/14/20    Chief Complaint:   OBESITY Laurie Mckinney is here to discuss her progress with her obesity treatment plan. She is on the the Category 3 Plan and states she is following her eating plan approximately 35 % of the time. She states she is exercising Walking 30 minutes 6-7 times per week.   Interim History:  03/23/2023 PCP OV-CPE Labs Colo Guard +  NEEDS REPEAT COLONOSCOPY No change to medications  She has significantly increased walking intensity and time.  Subjective:   1. Type 2 diabetes mellitus with diabetic neuropathy, without long-term current use of insulin (HCC) Lab Results  Component Value Date   HGBA1C 5.8 (A) 03/23/2023   HGBA1C 5.8 (H) 10/29/2022   HGBA1C 5.9 (H) 08/03/2022     Latest Reference Range & Units 11/30/22 13:03  INSULIN 2.6 - 24.9 uIU/mL 12.6    She reports Fasting CBG range from 118-121 She denies sx's of hypoglycemia She is currently on   2, Vit D Def  Latest Reference Range & Units 11/30/22 13:03 03/23/23 11:59  Vitamin D, 25-Hydroxy 30.0 - 100.0 ng/mL 29.8 (L) 68.8  (L): Data is abnormally low  She endorses stable energy levels  Assessment/Plan:   1. Type 2 diabetes mellitus with other specified complication, without long-term current use of insulin (HCC) Refill - Semaglutide, 1 MG/DOSE, 4 MG/3ML SOPN; Inject 1 mg as directed  once a week.  Dispense: 9 mL; Refill: 0  2. Vit D Def Refill Vitamin D, Ergocalciferol, (DRISDOL) 1.25 MG (50000 UNIT) CAPS capsule Take 1 capsule (50,000 Units total) by mouth every 7 (seven) days. Dispense: 12 capsule, Refills: 0 ordered   3. Obesity, Starting BMI 37.38  Laurie Mckinney is currently in the action stage of change. As such, her goal is to continue with weight loss efforts. She has agreed to the Category 3 Plan.   Exercise goals: For substantial health benefits, adults should do at least 150 minutes (2 hours and 30 minutes) a week of moderate-intensity, or 75 minutes (1 hour and 15 minutes) a week of vigorous-intensity aerobic physical activity, or an equivalent combination of moderate- and vigorous-intensity aerobic activity. Aerobic activity should be performed in episodes of at least 10 minutes, and preferably, it should be spread throughout the week.  Behavioral modification strategies: increasing lean protein intake, decreasing simple carbohydrates, increasing vegetables, increasing water intake, no skipping meals, meal planning and cooking strategies, and planning for success.  Laurie Mckinney has agreed to follow-up with our clinic in 8 weeks. She was informed of the importance of frequent follow-up visits to maximize her success with intensive lifestyle modifications for her multiple health conditions.   Objective:   Blood pressure 99/66, pulse 63, temperature 97.8 F (36.6 C), height 5\' 11"  (1.803 m), weight 210 lb (95.3 kg), last menstrual period 01/26/2011, SpO2 98%. Body mass index is 29.29 kg/m.  General: Cooperative, alert, well developed, in no acute distress. HEENT: Conjunctivae and lids unremarkable. Cardiovascular: Regular rhythm.  Lungs: Normal work of breathing. Neurologic: No focal deficits.   Lab Results  Component Value Date   CREATININE 0.73 03/23/2023   BUN 13 03/23/2023   NA 141 03/23/2023   K 4.3 03/23/2023   CL 104 03/23/2023   CO2 25 03/23/2023    Lab Results  Component Value Date   ALT 18 03/23/2023   AST 21 03/23/2023   ALKPHOS 87 03/23/2023   BILITOT 0.5 03/23/2023   Lab Results  Component Value Date   HGBA1C 5.8 (A) 03/23/2023   HGBA1C 5.8 (H) 10/29/2022   HGBA1C 5.9 (H) 08/03/2022   HGBA1C 5.5 03/09/2022   HGBA1C 5.8 (A) 12/18/2021   Lab Results  Component Value Date   INSULIN 12.6 11/30/2022   INSULIN 9.7 05/13/2021   INSULIN 4.0 11/14/2020   Lab Results  Component Value Date   TSH 0.724 11/14/2020   Lab Results  Component Value Date   CHOL 121 03/23/2023   HDL 75 03/23/2023   LDLCALC 36 03/23/2023   TRIG 38 03/23/2023   CHOLHDL 1.6 03/23/2023   Lab Results  Component Value Date   VD25OH 68.8 03/23/2023   VD25OH 29.8 (L) 11/30/2022   VD25OH 40.5 11/14/2020   Lab Results  Component Value Date   WBC 5.9 03/23/2023   HGB 13.8 03/23/2023   HCT 41.7 03/23/2023   MCV 91 03/23/2023   PLT 249 03/23/2023   No results found for: "IRON", "TIBC", "FERRITIN"  Attestation Statements:   Reviewed by clinician on day of visit: allergies, medications, problem list, medical history, surgical history, family history, social history, and previous encounter notes.  I have reviewed the above documentation for accuracy and completeness, and I agree with the above. -  Kearston Putman d. Prisha Hiley, NP-C

## 2023-04-30 ENCOUNTER — Encounter: Payer: Self-pay | Admitting: Gastroenterology

## 2023-05-04 DIAGNOSIS — M79676 Pain in unspecified toe(s): Secondary | ICD-10-CM | POA: Diagnosis not present

## 2023-05-04 DIAGNOSIS — B351 Tinea unguium: Secondary | ICD-10-CM | POA: Diagnosis not present

## 2023-05-06 ENCOUNTER — Encounter: Payer: Self-pay | Admitting: Family Medicine

## 2023-05-16 ENCOUNTER — Other Ambulatory Visit: Payer: Self-pay | Admitting: Family Medicine

## 2023-05-16 DIAGNOSIS — E1169 Type 2 diabetes mellitus with other specified complication: Secondary | ICD-10-CM

## 2023-05-20 DIAGNOSIS — G4733 Obstructive sleep apnea (adult) (pediatric): Secondary | ICD-10-CM | POA: Diagnosis not present

## 2023-05-27 ENCOUNTER — Other Ambulatory Visit (INDEPENDENT_AMBULATORY_CARE_PROVIDER_SITE_OTHER): Payer: Self-pay | Admitting: Adult Health

## 2023-05-27 DIAGNOSIS — E1169 Type 2 diabetes mellitus with other specified complication: Secondary | ICD-10-CM

## 2023-06-14 ENCOUNTER — Encounter (INDEPENDENT_AMBULATORY_CARE_PROVIDER_SITE_OTHER): Payer: Self-pay | Admitting: Adult Health

## 2023-06-14 ENCOUNTER — Ambulatory Visit (INDEPENDENT_AMBULATORY_CARE_PROVIDER_SITE_OTHER): Payer: BC Managed Care – PPO | Admitting: Adult Health

## 2023-06-14 VITALS — BP 108/68 | HR 54 | Temp 97.8°F | Ht 71.0 in | Wt 210.0 lb

## 2023-06-14 DIAGNOSIS — E1140 Type 2 diabetes mellitus with diabetic neuropathy, unspecified: Secondary | ICD-10-CM | POA: Diagnosis not present

## 2023-06-14 DIAGNOSIS — E669 Obesity, unspecified: Secondary | ICD-10-CM | POA: Diagnosis not present

## 2023-06-14 DIAGNOSIS — E559 Vitamin D deficiency, unspecified: Secondary | ICD-10-CM

## 2023-06-14 DIAGNOSIS — E1169 Type 2 diabetes mellitus with other specified complication: Secondary | ICD-10-CM

## 2023-06-14 DIAGNOSIS — N939 Abnormal uterine and vaginal bleeding, unspecified: Secondary | ICD-10-CM | POA: Diagnosis not present

## 2023-06-14 DIAGNOSIS — Z6829 Body mass index (BMI) 29.0-29.9, adult: Secondary | ICD-10-CM

## 2023-06-14 DIAGNOSIS — R300 Dysuria: Secondary | ICD-10-CM | POA: Diagnosis not present

## 2023-06-14 DIAGNOSIS — Z7985 Long-term (current) use of injectable non-insulin antidiabetic drugs: Secondary | ICD-10-CM

## 2023-06-14 MED ORDER — VITAMIN D (ERGOCALCIFEROL) 1.25 MG (50000 UNIT) PO CAPS: 50000.0000 [IU] | ORAL_CAPSULE | ORAL | 0 refills | Status: DC

## 2023-06-14 MED ORDER — SEMAGLUTIDE (1 MG/DOSE) 4 MG/3ML ~~LOC~~ SOPN
1.0000 mg | PEN_INJECTOR | SUBCUTANEOUS | 0 refills | Status: DC
Start: 2023-06-14 — End: 2023-08-12

## 2023-06-14 NOTE — Progress Notes (Signed)
WEIGHT SUMMARY AND BIOMETRICS  Vitals Temp: 97.8 F (36.6 C) BP: 108/68 Pulse Rate: (!) 54 SpO2: 98 %   Anthropometric Measurements Height: 5\' 11"  (1.803 m) Weight: 210 lb (95.3 kg) BMI (Calculated): 29.3 Weight at Last Visit: 210lb Weight Lost Since Last Visit: 0 Weight Gained Since Last Visit: 0 Starting Weight: 268lb Total Weight Loss (lbs): 48 lb (21.8 kg)   Body Composition  Body Fat %: 39.2 % Fat Mass (lbs): 82.6 lbs Muscle Mass (lbs): 121.6 lbs Total Body Water (lbs): 86 lbs Visceral Fat Rating : 10   Other Clinical Data Fasting: no Labs: no Today's Visit #: 52 Starting Date: 11/14/20    Chief Complaint:   OBESITY Laurie Mckinney is here to discuss her progress with her obesity treatment plan. She is on the the Category 3 Plan and states she is following her eating plan approximately 45 % of the time. She states she is exercising Brisk Walking 20-30 minutes 7 times per week.   Interim History:  She has had + ColoGuard, waiting to schedule Colonoscopy  She reports recent abnormal vaginal bleeding- instructed to IMMEDIATELY F/U with established GYN  Reviewed Bioempedence results with pt: Muscle Mass: +0.2 lb Adipose Mass: -0.4 lbs  Subjective:   1. Vaginal bleeding, abnormal She has experienced 3 isolated episodes of abnormal vaginal bleeding, ranges from scant to moderate amount. She reports this started 10-14 days ago. She denies flank pain or fever. She estimates to be post menopausal > 10 years  2. Type 2 diabetes mellitus with diabetic neuropathy, without long-term current use of insulin (HCC) Lab Results  Component Value Date   HGBA1C 5.8 (A) 03/23/2023   HGBA1C 5.8 (H) 10/29/2022   HGBA1C 5.9 (H) 08/03/2022   She infrequently check home CBG, this morning her fasting CBG was 116- at goal. She is on weekly Ozempioc 1mg  injection Denies mass in neck, dysphagia, dyspepsia, persistent hoarseness, abdominal pain, or N/V/C    3. Vit D  Def She is on weekly Ergocalciferol- denies N/V/Muscle Weakness  Latest Reference Range & Units 03/23/23 11:59  Vitamin D, 25-Hydroxy 30.0 - 100.0 ng/mL 68.8   Assessment/Plan:   1. Vaginal bleeding, abnormal IMMEDIATELY FOLLOW-UP WITH GYN-pet verbalized understanding and agreement  2. Type 2 diabetes mellitus with diabetic neuropathy, without long-term current use of insulin (HCC) Refill []   Semaglutide, 1 MG/DOSE, 4 MG/3ML SOPN Inject 1 mg as directed once a week. Dispense: 9 mL, Refills: 0 ordered   3. Vit D Def Refill and DECREASE  Vitamin D, Ergocalciferol, (DRISDOL) 1.25 MG (50000 UNIT) CAPS capsule Take 1 capsule (50,000 Units total) by mouth every 14 (fourteen) days. Dispense: 8 capsule, Refills: 0 ordered   4. Obesity (BMI 30-39.9), Current BMI 29.3  Laurie Mckinney is currently in the action stage of change. As such, her goal is to continue with weight loss efforts. She has agreed to the Category 3 Plan.   Exercise goals: For substantial health benefits, adults should do at least 150 minutes (2 hours and 30 minutes) a week of moderate-intensity, or 75 minutes (1 hour and 15 minutes) a week of vigorous-intensity aerobic physical activity, or an equivalent combination of moderate- and vigorous-intensity aerobic activity. Aerobic activity should be performed in episodes of at least 10 minutes, and preferably, it should be spread throughout the week.  Behavioral modification strategies: increasing lean protein intake, decreasing simple carbohydrates, increasing vegetables, increasing water intake, no skipping meals, meal planning and cooking strategies, and planning for success.  [name] has agreed  to follow-up with our clinic in 4 weeks. She was informed of the importance of frequent follow-up visits to maximize her success with intensive lifestyle modifications for her multiple health conditions.   Objective:   Blood pressure 108/68, pulse (!) 54, temperature 97.8 F (36.6 C),  height 5\' 11"  (1.803 m), weight 210 lb (95.3 kg), last menstrual period 01/26/2011, SpO2 98%. Body mass index is 29.29 kg/m.  General: Cooperative, alert, well developed, in no acute distress. HEENT: Conjunctivae and lids unremarkable. Cardiovascular: Regular rhythm.  Lungs: Normal work of breathing. Neurologic: No focal deficits.   Lab Results  Component Value Date   CREATININE 0.73 03/23/2023   BUN 13 03/23/2023   NA 141 03/23/2023   K 4.3 03/23/2023   CL 104 03/23/2023   CO2 25 03/23/2023   Lab Results  Component Value Date   ALT 18 03/23/2023   AST 21 03/23/2023   ALKPHOS 87 03/23/2023   BILITOT 0.5 03/23/2023   Lab Results  Component Value Date   HGBA1C 5.8 (A) 03/23/2023   HGBA1C 5.8 (H) 10/29/2022   HGBA1C 5.9 (H) 08/03/2022   HGBA1C 5.5 03/09/2022   HGBA1C 5.8 (A) 12/18/2021   Lab Results  Component Value Date   INSULIN 12.6 11/30/2022   INSULIN 9.7 05/13/2021   INSULIN 4.0 11/14/2020   Lab Results  Component Value Date   TSH 0.724 11/14/2020   Lab Results  Component Value Date   CHOL 121 03/23/2023   HDL 75 03/23/2023   LDLCALC 36 03/23/2023   TRIG 38 03/23/2023   CHOLHDL 1.6 03/23/2023   Lab Results  Component Value Date   VD25OH 68.8 03/23/2023   VD25OH 29.8 (L) 11/30/2022   VD25OH 40.5 11/14/2020   Lab Results  Component Value Date   WBC 5.9 03/23/2023   HGB 13.8 03/23/2023   HCT 41.7 03/23/2023   MCV 91 03/23/2023   PLT 249 03/23/2023   No results found for: "IRON", "TIBC", "FERRITIN"  Attestation Statements:   Reviewed by clinician on day of visit: allergies, medications, problem list, medical history, surgical history, family history, social history, and previous encounter notes.  I have reviewed the above documentation for accuracy and completeness, and I agree with the above. -  Moncerrat Burnstein d. Sadat Sliwa, NP-C

## 2023-06-20 DIAGNOSIS — G4733 Obstructive sleep apnea (adult) (pediatric): Secondary | ICD-10-CM | POA: Diagnosis not present

## 2023-06-21 DIAGNOSIS — N939 Abnormal uterine and vaginal bleeding, unspecified: Secondary | ICD-10-CM | POA: Diagnosis not present

## 2023-06-21 DIAGNOSIS — R102 Pelvic and perineal pain: Secondary | ICD-10-CM | POA: Diagnosis not present

## 2023-06-23 ENCOUNTER — Encounter: Payer: Self-pay | Admitting: Family Medicine

## 2023-07-06 DIAGNOSIS — Z01419 Encounter for gynecological examination (general) (routine) without abnormal findings: Secondary | ICD-10-CM | POA: Diagnosis not present

## 2023-07-06 DIAGNOSIS — R319 Hematuria, unspecified: Secondary | ICD-10-CM | POA: Diagnosis not present

## 2023-07-06 DIAGNOSIS — Z1231 Encounter for screening mammogram for malignant neoplasm of breast: Secondary | ICD-10-CM | POA: Diagnosis not present

## 2023-07-06 NOTE — Progress Notes (Unsigned)
PATIENT: Laurie Mckinney DOB: 1961/05/06  REASON FOR VISIT: follow up HISTORY FROM: patient  No chief complaint on file.    HISTORY OF PRESENT ILLNESS: Today 07/06/23:  Laurie Mckinney is a 62 y.o. female with a history of OSA on CPAP. Returns today for follow-up.      07/06/22: Laurie Mckinney is a 62 year old female with a history of obstructive sleep apnea on CPAP and peripheral neuropathy.  She returns today for follow-up.  She does use the CPAP nightly.  However she does feel that she is sleeping lighter and is more aware of the CPAP.  She does find it annoying.  She does state that she has lost weight since her original study.  She also feels that she is in better health since her original study.  Possibly interested in inspire.   Still has numbness and pain in the toes. Has the gabapentin but does use it. SHe is just tolerating it at this time. Taking OTC supplement nervive. Walking more and feels that that has helped.     07/01/21: Laurie Mckinney is a 62 year old female with a history of obstructive sleep apnea on CPAP and neuropathy.  She returns today for follow-up.  She reports that the CPAP works well for her.  She even tries to use it when she takes a nap.  Denies any issues.  Neuropathy: She feels that is gotten slightly worse.  Still only extends up to the ankles.  Primarily has numbness in the toes.  Notices most of her discomfort when she gets in the bed.  States that she occasionally gets sharp shooting pains but otherwise the numbness is what bothers her the most.  [facility] at [facility]  Reports that she is lost 16 pounds.  Her last hemoglobin A1c was 6.7.  Reports that she is trying to lower her sugar numbers.    HISTORY (copied from Dr. Trevor Mace note) 09/14/2019: Since I last saw patient she was diagnosed with sleep apnea, she underwent sleep study on July 09, 2019 which showed severe mixed sleep apnea and was recommended initiation of  CPAP, she is been extremely compliant.  Requested back today by Adam Phenix DPM for a new issue, she had a foot exam because she has numbness in the bottom of her feet, if she has slippers on she can't tell whether her shoes are on or off, numbness, when she shaves she has numbness up her legs, a numbing sensation to above the ankles. She saw a podiatrist, she saw Dr. Susann Givens.  She is obese, she has diabetes, last HgbA1c was 6.6(reviewed labs) but has been higher per patient. Brother drank a 6-pack for 30 years and had neuropathy but no other family history. Really numb, no significant pain, notices it more at night, nothing makes it better or worse, her feet get hot, ongoing for years, slowly progressive, symmetric. No other focal neurologic deficits, associated symptoms, inciting events or modifiable factors..No weakness. No lumbar radiculopathy or connection with the low back.     I reviewed patient's notes from Dr. Adam Phenix, patient has symptoms in both feet, she has noticed this as an example shaving her legs, the feet feels a sensitive up to her ankle, she does notice numbness more so at rest without her shoes on, noticeable more so this year, recently diagnosed as diabetic, unclear how long she was prediabetic for a while, she has been advised on trying to be diet controlled, lots of  healthcare issues, dizziness, slurring speech, worsening control of hands, following, she has been evaluated by neurology and cardiology and diagnosed with A. 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, hemoglobin 6.6 in August 18, also has hypertension, never smoker, no significant alcohol use, she is on Eliquis twice a day for her A. fib and Provigil for her sleeping disorder, palpable pedal pulses, pronounced fifth metatarsal head bilaterally with callus under the right loss of deep vibratory sensation at the left foot  metatarsal heads, intact but diminished positional testing of the hallux, x-rays (reviewed reports) mild residual metatarsus abductus foot type, with tailor's bunion appreciated bilaterally, spurring noted on the distal phalanx medially, mild deformity bilaterally, on lateral view relatively high arched foot type, no subluxation, dislocation of arch diagnosed with peripheral neuropathy and numbness of the foot.   TSH nml, B12 976, hepc negative.    I reviewed MRi cervical spine images and agree with the following: MRI cervical spine (without) demonstrating: - At C5-6: disc bulging with borderline mild spinal stenosis and moderate left foraminal stenosis; no cord signal abnormalities.   HPI:  Laurie Mckinney is a 62 y.o. female here as requested by Dr. Aleen Campi for aphasia.  Past medical history obesity, Mobitz type II second-degree heart block, migraines, hypertension, diabetes, a fib, asthma, adjustment insomnia.She is here with her husband. She "cannot control her body", she is wobbly, she is short of breath, she misjudges everything, she throws things and they don;t end up where she wants, her long term memory is doing well but can;t get her brain to function, poor eye-hand coordination, she feels very fatigued. Here with hr husband who provides much information. She feels her depression and anxiety, no focal weakness, no facial droop, no dysarthria. 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    Reviewed notes, labs and imaging from outside physicians, which showed: I reviewed Dr. Adelene Idler notes.  She was most recently seen earlier this month.  Patient reported dizziness and new onset atrial fibrillation ended up seeing A. fib clinic July 15 and was sent to the emergency room for possible stroke.  She continues to feel "dizzy and loopy".  She has trouble with  coordination between her brain or arms and not feeling herself.  She only has a note writing her out of work through the end of this week and that she is not safe to drive or go back to work next week.  She continues to feel dizzy and loopy.  Trouble with concentration between her brain and arms.  Trouble finding words at times are making clear thoughts at times.  Processing speed seems off.  She is concerned about sleep apnea given history of snoring, she does have pauses in her sleep according to her husband, she is gained 30 pounds in the last 4 years   Personally reviewed MRI of the brain April 19, 2019 and agree with the following:No acute or significant brain finding. Normal study with exception of a few punctate foci of T2 and FLAIR signal within the white matter consistent with minimal small vessel change. There is a significant amount of stress, she works overnight, she doesn't sleep well.   TSH normal hgba1c 6.8 Cbc/cmp unremarkable B12 976    REVIEW OF SYSTEMS: Out of  a complete 14 system review of symptoms, the patient complains only of the following symptoms, and all other reviewed systems are negative.    Fatigue severity score 16 Epworth sleepiness score 8  ALLERGIES: No Known Allergies  HOME MEDICATIONS: Outpatient Medications Prior to Visit  Medication Sig Dispense Refill   acetaminophen (TYLENOL) 650 MG CR tablet Take 650 mg by mouth every 8 (eight) hours as needed for pain.     atorvastatin (LIPITOR) 10 MG tablet TAKE 1 TABLET DAILY 90 tablet 0   buPROPion (WELLBUTRIN SR) 200 MG 12 hr tablet Take 1 tablet (200 mg total) by mouth daily. 90 tablet 0   GEMTESA 75 MG TABS Take 1 tablet by mouth daily.     Lancets (ONETOUCH DELICA PLUS LANCET33G) MISC USE AND DISCARD 1 LANCET   DAILY 100 each 3   lisinopril-hydrochlorothiazide (ZESTORETIC) 20-25 MG tablet Take 1 tablet by mouth daily. 90 tablet 3   Multiple Vitamins-Minerals (CENTRUM SILVER 50+WOMEN) TABS Take 1 tablet by  mouth daily.     ONETOUCH VERIO test strip USE AND DISCARD 1 TEST     STRIP AS NEEDED AS         INSTRUCTED 100 each 3   oxybutynin (DITROPAN-XL) 10 MG 24 hr tablet TAKE 1 TABLET AT BEDTIME 90 tablet 1   PARoxetine (PAXIL) 20 MG tablet Take 1 tablet (20 mg total) by mouth daily. 90 tablet 0   Semaglutide, 1 MG/DOSE, 4 MG/3ML SOPN Inject 1 mg as directed once a week. 9 mL 0   Vitamin D, Ergocalciferol, (DRISDOL) 1.25 MG (50000 UNIT) CAPS capsule Take 1 capsule (50,000 Units total) by mouth every 14 (fourteen) days. 8 capsule 0   XARELTO 20 MG TABS tablet TAKE 1 TABLET BY MOUTH DAILY WITH SUPPER 90 tablet 0   No facility-administered medications prior to visit.    PAST MEDICAL HISTORY: Past Medical History:  Diagnosis Date   Adjustment insomnia    SHIFT WORK   Allergy    RHINITIS   Asthma    Atrial flutter (HCC) 02/26/2015   [facility]   Back pain    Bursitis    right hip   Cataract    see last eye eexam note    Constipation    Depression    Diabetes mellitus without complication (HCC)    History of atrial fibrillation    History of cancer    Hypertension    Incontinence    Joint pain    Migraine headache    Mobitz type 2 second degree heart block    Neuropathy    Obesity    Other fatigue    Shortness of breath on exertion    Sleep apnea     PAST SURGICAL HISTORY: Past Surgical History:  Procedure Laterality Date   ABDOMINAL HYSTERECTOMY     ATRIAL FIBRILLATION ABLATION N/A 10/27/2019   Procedure: ATRIAL FIBRILLATION ABLATION;  Surgeon: Regan Lemming, MD;  Location: [facility];  Service: Cardiovascular;  Laterality: N/A;   BREAST REDUCTION SURGERY Bilateral 11/19/2022   Procedure: BREAST REDUCTION WITH LIPOSUCTION;  Surgeon: Peggye Form, DO;  Location: [facility];  Service: Plastics;  Laterality: Bilateral;   CARDIOVERSION N/A 05/25/2019   Procedure: CARDIOVERSION;  Surgeon: Antoine Poche, MD;  Location: [facility];  Service: Endoscopy;  Laterality: N/A;   COLONOSCOPY  2011   Dr.Brodie   ROBOTIC ASSISTED TOTAL HYSTERECTOMY WITH BILATERAL SALPINGO OOPHERECTOMY Bilateral 02/05/2015   Procedure: ROBOTIC ASSISTED  TOTAL LAPAROSCOPIC HYSTERECTOMY WITH BILATERAL SALPINGO OOPHORECTOMY;  Surgeon: Adolphus Birchwood, MD;  Location: [facility];  Service: Gynecology;  Laterality: Bilateral;    FAMILY HISTORY: Family History  Problem Relation Age of Onset   Arthritis Mother    Heart disease Father    Hypertension Father    Hyperlipidemia Father    Sudden death Father    Neuropathy Brother        both legs    Diabetes Brother        no treatment that the pt knows of    Other Brother        drank a 6 pack of beer daily x 30 years    Sleep apnea Neg Hx     SOCIAL HISTORY: Social History   Socioeconomic History   Marital status: Married    Spouse name: Rayshonda [place]   Number of children: 2   Years of education: Not on file   Highest education level: Not on file  Occupational History   Occupation: Education administrator for parent  Tobacco Use   Smoking status: Never   Smokeless tobacco: Never  Vaping Use   Vaping status: Never Used  Substance and Sexual Activity   Alcohol use: Not Currently    Alcohol/week: 1.0 standard drink of alcohol    Types: 1 Standard drinks or equivalent per week   Drug use: No   Sexual activity: Yes    Partners: Male    Comment: Has had hysterectomy.  Other Topics Concern   Not on file  Social History Narrative   Lives at home with husband   Right handed   Caffeine: 1 cup of coffee in the morning and <20 oz of soda per day   Social Determinants of Health   Financial Resource Strain: Not on file  Food Insecurity: No Food Insecurity (03/23/2023)   Hunger Vital Sign    Worried About Running Out of Food in the Last Year: Never true    Ran Out of Food in the Last Year: Never true  Transportation Needs: No Transportation Needs (03/23/2023)   PRAPARE - Scientist, research (physical sciences) (Medical): No    Lack of Transportation (Non-Medical): No  Physical Activity: Not on file  Stress: Not on file  Social Connections: Not on file  Intimate Partner Violence: Not At Risk (03/23/2023)   Humiliation, Afraid, Rape, and Kick questionnaire    Fear of Current or Ex-Partner: No    Emotionally Abused: No    Physically Abused: No    Sexually Abused: No      PHYSICAL EXAM  There were no vitals filed for this visit.  There is no height or weight on file to calculate BMI.  Generalized: Well developed, in no acute distress   Neurological examination  Mentation: Alert oriented to time, place, history taking. Follows all commands speech and language fluent Cranial nerve II-XII: Pupils were equal round reactive to light. Extraocular movements were full, visual field were full on confrontational test. Facial sensation and strength were normal. Uvula tongue midline. Head turning and shoulder shrug  were normal and symmetric. Motor: The motor testing reveals 5 over 5 strength of all 4 extremities. Good symmetric motor tone is noted throughout.  Sensory: Pinprick and vibration sensation decreased in the lower extremities in a stocking-like pattern.  No evidence of extinction is noted.  Coordination: Cerebellar testing reveals good finger-nose-finger and heel-to-shin bilaterally.  Gait and station: Gait is normal.  Reflexes: Deep tendon reflexes are symmetric  and normal bilaterally.   DIAGNOSTIC DATA (LABS, IMAGING, TESTING) - I reviewed patient records, labs, notes, testing and imaging myself where available.  Lab Results  Component Value Date   WBC 5.9 03/23/2023   HGB 13.8 03/23/2023   HCT 41.7 03/23/2023   MCV 91 03/23/2023   PLT 249 03/23/2023      Component Value Date/Time   NA 141 03/23/2023 1159   K 4.3 03/23/2023 1159   CL 104 03/23/2023 1159   CO2 25 03/23/2023 1159   GLUCOSE 87 03/23/2023 1159   GLUCOSE 104 (H) 11/16/2022 1222   BUN 13 03/23/2023  1159   CREATININE 0.73 03/23/2023 1159   CREATININE 0.74 10/11/2019 0945   CALCIUM 9.6 03/23/2023 1159   PROT 7.1 03/23/2023 1159   ALBUMIN 4.3 03/23/2023 1159   AST 21 03/23/2023 1159   ALT 18 03/23/2023 1159   ALKPHOS 87 03/23/2023 1159   BILITOT 0.5 03/23/2023 1159   GFRNONAA >60 11/16/2022 1222   GFRAA 95 11/14/2020 0944   Lab Results  Component Value Date   CHOL 121 03/23/2023   HDL 75 03/23/2023   LDLCALC 36 03/23/2023   TRIG 38 03/23/2023   CHOLHDL 1.6 03/23/2023   Lab Results  Component Value Date   HGBA1C 5.8 (A) 03/23/2023   Lab Results  Component Value Date   VITAMINB12 1,545 (H) 11/14/2020   Lab Results  Component Value Date   TSH 0.724 11/14/2020      ASSESSMENT AND PLAN 62 y.o. year old female  has a past medical history of Adjustment insomnia, Allergy, Asthma, Atrial flutter (HCC) (02/26/2015), Back pain, Bursitis, Cataract, Constipation, Depression, Diabetes mellitus without complication (HCC), History of atrial fibrillation, History of cancer, Hypertension, Incontinence, Joint pain, Migraine headache, Mobitz type 2 second degree heart block, Neuropathy, Obesity, Other fatigue, Shortness of breath on exertion, and Sleep apnea. here with:   1.  Obstructive sleep apnea on CPAP   --Good treatment of apnea --Encouraged her to continue using CPAP nightly and greater than 4 hours each night -- We will repeat home sleep test pending results may consider other treatment options  2.  Peripheral neuropathy  -- Does not want to be on any medication at this time. --Encouraged patient to continue to monitor her diet and encouraged exercise    Advised if symptoms worsen or she develops new symptoms she should let us know.  Follow-up in 1 year or sooner if needed   Butch Penny, MSN, NP-C 07/06/2023, 5:09 PM [facility] [address] [place], [place] [address] [phone]

## 2023-07-07 ENCOUNTER — Telehealth: Payer: Self-pay | Admitting: *Deleted

## 2023-07-07 ENCOUNTER — Encounter: Payer: Self-pay | Admitting: Adult Health

## 2023-07-07 ENCOUNTER — Other Ambulatory Visit: Payer: Self-pay | Admitting: Family Medicine

## 2023-07-07 ENCOUNTER — Ambulatory Visit (INDEPENDENT_AMBULATORY_CARE_PROVIDER_SITE_OTHER): Payer: BC Managed Care – PPO | Admitting: Adult Health

## 2023-07-07 VITALS — BP 117/72 | HR 67 | Ht 71.0 in | Wt 213.2 lb

## 2023-07-07 DIAGNOSIS — G4733 Obstructive sleep apnea (adult) (pediatric): Secondary | ICD-10-CM

## 2023-07-07 DIAGNOSIS — E1169 Type 2 diabetes mellitus with other specified complication: Secondary | ICD-10-CM

## 2023-07-07 DIAGNOSIS — E1142 Type 2 diabetes mellitus with diabetic polyneuropathy: Secondary | ICD-10-CM | POA: Diagnosis not present

## 2023-07-07 DIAGNOSIS — Z794 Long term (current) use of insulin: Secondary | ICD-10-CM | POA: Diagnosis not present

## 2023-07-07 DIAGNOSIS — F341 Dysthymic disorder: Secondary | ICD-10-CM

## 2023-07-07 NOTE — Patient Instructions (Signed)
Continue using CPAP nightly and greater than 4 hours each night Order sent to [facility] for cream that she can use on your feet at bedtime and as needed. If your symptoms worsen or you develop new symptoms please let us know.

## 2023-07-07 NOTE — Telephone Encounter (Signed)
Order for neuropathy cream faxed to [facility]. Megan NP is allowing for substitution by pharmacist if needed for insurance.   Product selected on order:  Amantadine 8%, Baclofen 2%, Gabapentin 6%, Amitriptyline 4%, Bupivacaine 2%, Clonidine 0.2%.   Apply 1-2 grams to the affected area 3-4 times daily  Refills PRN, dispense 240 gram   This has been added to [facility].

## 2023-07-13 ENCOUNTER — Ambulatory Visit (INDEPENDENT_AMBULATORY_CARE_PROVIDER_SITE_OTHER): Payer: BC Managed Care – PPO | Admitting: Adult Health

## 2023-07-14 ENCOUNTER — Ambulatory Visit (INDEPENDENT_AMBULATORY_CARE_PROVIDER_SITE_OTHER): Payer: BC Managed Care – PPO | Admitting: Nurse Practitioner

## 2023-07-14 ENCOUNTER — Encounter: Payer: Self-pay | Admitting: Nurse Practitioner

## 2023-07-14 ENCOUNTER — Telehealth: Payer: Self-pay

## 2023-07-14 VITALS — BP 90/64 | HR 81 | Ht 71.0 in | Wt 215.4 lb

## 2023-07-14 DIAGNOSIS — R195 Other fecal abnormalities: Secondary | ICD-10-CM

## 2023-07-14 DIAGNOSIS — K5909 Other constipation: Secondary | ICD-10-CM | POA: Diagnosis not present

## 2023-07-14 MED ORDER — NA SULFATE-K SULFATE-MG SULF 17.5-3.13-1.6 GM/177ML PO SOLN: 1.0000 | Freq: Once | ORAL | 0 refills | Status: AC

## 2023-07-14 NOTE — Progress Notes (Signed)
I agree with the assessment and plan as outlined by Ms. Guenther. 

## 2023-07-14 NOTE — Patient Instructions (Addendum)
_______________________________________________________  If your blood pressure at your visit was 140/90 or greater, please contact your primary care physician to follow up on this.  _______________________________________________________  If you are age 64 or older, your body mass index should be between 23-30. Your Body mass index is 30.04 kg/m. If this is out of the aforementioned range listed, please consider follow up with your Primary Care Provider.  If you are age 85 or younger, your body mass index should be between 19-25. Your Body mass index is 30.04 kg/m. If this is out of the aformentioned range listed, please consider follow up with your Primary Care Provider.   ________________________________________________________  The [facility] providers would like to encourage you to use [facility] to communicate with providers for non-urgent requests or questions.  Due to long hold times on the telephone, sending your provider a message by [facility] may be a faster and more efficient way to get a response.  Please allow 48 business hours for a response.  Please remember that this is for non-urgent requests.  _______________________________________________________  We have sent the following medications to your pharmacy for you to pick up at your convenience: Suprep  You will be contacted by our office prior to your procedure for directions on holding your blood thinner.  If you do not hear from our office 1 week prior to your scheduled procedure, please call [phone] to discuss.   You have been scheduled for a colonoscopy. Please follow written instructions given to you at your visit today.   Please pick up your prep supplies at the pharmacy within the next 1-3 days.  If you use inhalers (even only as needed), please bring them with you on the day of your procedure.  DO NOT TAKE 7 DAYS PRIOR TO TEST- Trulicity (dulaglutide) Ozempic, Wegovy (semaglutide) Mounjaro  (tirzepatide) Bydureon Bcise (exanatide extended release)  DO NOT TAKE 1 DAY PRIOR TO YOUR TEST Rybelsus (semaglutide) Adlyxin (lixisenatide) Victoza (liraglutide) Byetta (exanatide) ___________________________________________________________________________  It was a pleasure to see you today!  Thank you for trusting me with your gastrointestinal care!

## 2023-07-14 NOTE — Progress Notes (Signed)
ASSESSMENT    62 y.o. yo female previously known to Dr. Juanda Chance ( 2011). She has a with a history of OSA on CPAP, peripheral neuropathy, HTN, AFIB on Xarelto / Aflutter, DM. Referred by PCP for a positive Cologuard  Positive Cologuard July 2024.  No overt GI bleeding. No alarm symptoms  Probable GERD with occasional sharp chest pain usually following meals.  Relieved with Tums.   Chronic constipation.  She attributes constipation to Ozempic. Manages with metamucil and smooth smooth tea as needed  Diabetes. On Ozempic.   PLAN   -Schedule for colonoscopy. The risks and benefits of colonoscopy with possible polypectomy / biopsies were discussed and the patient agrees to proceed.  --Hold Xarelto for 2 days before procedure - will instruct when and how to resume after procedure. Patient understands that there is a low but real risk of cardiovascular event such as heart attack, stroke, or embolism /  thrombosis while off blood thinner. The patient consents to proceed. Will communicate by phone or EMR with patient's prescribing provider to confirm that holding Xarelto is reasonable in this case. --Needs a two days bowel --Hold Ozempic for 7 days prior to procedure    HPI   Chief complaint :  positive Cologuard  No blood in stool. Hgb 13.4 in June 2024   She struggles with constipation since being on Ozempic for 2 years. Mushroom coffee helps. Also drinks smooth move tea and takes Metamucil as needed.   Mother had colon polyps. No known colon cancer.    Previous GI Studies   Colonoscopy in 2010 - normal.    Labs      Latest Ref Rng & Units 03/23/2023   11:59 AM 03/09/2022   10:09 AM 11/14/2020    9:44 AM  CBC  WBC 3.4 - 10.8 x10E3/uL 5.9  8.0  7.8   Hemoglobin 11.1 - 15.9 g/dL 81.1  91.4  78.2   Hematocrit 34.0 - 46.6 % 41.7  40.1  43.2   Platelets 150 - 450 x10E3/uL 249  271  289     No results found for: "LIPASE"    Latest Ref Rng & Units 03/23/2023   11:59 AM  11/16/2022   12:22 PM 03/09/2022   10:09 AM  CMP  Glucose 70 - 99 mg/dL 87  956  213   BUN 8 - 27 mg/dL 13  17  13    Creatinine 0.57 - 1.00 mg/dL 0.86  5.78  4.69   Sodium 134 - 144 mmol/L 141  137  141   Potassium 3.5 - 5.2 mmol/L 4.3  4.5  4.5   Chloride 96 - 106 mmol/L 104  102  102   CO2 20 - 29 mmol/L 25  27  25    Calcium 8.7 - 10.3 mg/dL 9.6  9.2  9.7   Total Protein 6.0 - 8.5 g/dL 7.1   7.2   Total Bilirubin 0.0 - 1.2 mg/dL 0.5   0.5   Alkaline Phos 44 - 121 IU/L 87   77   AST 0 - 40 IU/L 21   22   ALT 0 - 32 IU/L 18   18     Past Medical History:  Diagnosis Date   Adjustment insomnia    SHIFT WORK   Allergy    RHINITIS   Asthma    Atrial flutter (HCC) 02/26/2015   [facility]   Back pain    Bursitis    right hip   Cataract  see last eye eexam note    Constipation    Depression    Diabetes mellitus without complication (HCC)    History of atrial fibrillation    History of cancer    Hypertension    Incontinence    Joint pain    Migraine headache    Mobitz type 2 second degree heart block    Neuropathy    Obesity    Other fatigue    Shortness of breath on exertion    Sleep apnea    Past Surgical History:  Procedure Laterality Date   ABDOMINAL HYSTERECTOMY     ATRIAL FIBRILLATION ABLATION N/A 10/27/2019   Procedure: ATRIAL FIBRILLATION ABLATION;  Surgeon: Regan Lemming, MD;  Location: [facility];  Service: Cardiovascular;  Laterality: N/A;   BREAST REDUCTION SURGERY Bilateral 11/19/2022   Procedure: BREAST REDUCTION WITH LIPOSUCTION;  Surgeon: Peggye Form, DO;  Location: [facility];  Service: Plastics;  Laterality: Bilateral;   CARDIOVERSION N/A 05/25/2019   Procedure: CARDIOVERSION;  Surgeon: Antoine Poche, MD;  Location: [facility];  Service: Endoscopy;  Laterality: N/A;   COLONOSCOPY  2011   Dr.Brodie   ROBOTIC ASSISTED TOTAL HYSTERECTOMY WITH BILATERAL SALPINGO OOPHERECTOMY Bilateral 02/05/2015    Procedure: ROBOTIC ASSISTED TOTAL LAPAROSCOPIC HYSTERECTOMY WITH BILATERAL SALPINGO OOPHORECTOMY;  Surgeon: Adolphus Birchwood, MD;  Location: [facility];  Service: Gynecology;  Laterality: Bilateral;   Family History  Problem Relation Age of Onset   Arthritis Mother    Heart disease Father    Hypertension Father    Hyperlipidemia Father    Sudden death Father    Neuropathy Brother        both legs    Diabetes Brother        no treatment that the pt knows of    Other Brother        drank a 6 pack of beer daily x 30 years    Sleep apnea Neg Hx    Colon cancer Neg Hx    Pancreatic cancer Neg Hx    Stomach cancer Neg Hx    Esophageal cancer Neg Hx    Social History   Tobacco Use   Smoking status: Never   Smokeless tobacco: Never  Vaping Use   Vaping status: Never Used  Substance Use Topics   Alcohol use: Not Currently    Alcohol/week: 1.0 standard drink of alcohol    Types: 1 Standard drinks or equivalent per week   Drug use: No   Current Outpatient Medications  Medication Sig Dispense Refill   acetaminophen (TYLENOL) 650 MG CR tablet Take 650 mg by mouth every 8 (eight) hours as needed for pain.     atorvastatin (LIPITOR) 10 MG tablet TAKE 1 TABLET DAILY 90 tablet 0   buPROPion (WELLBUTRIN SR) 200 MG 12 hr tablet TAKE 1 TABLET BY MOUTH EVERY DAY 90 tablet 0   ergocalciferol (VITAMIN D2) 1.25 MG (50000 UT) capsule Take 1 capsule by mouth every 14 (fourteen) days.     GEMTESA 75 MG TABS Take 1 tablet by mouth daily.     Lancets (ONETOUCH DELICA PLUS LANCET33G) MISC USE AND DISCARD 1 LANCET   DAILY 100 each 3   lisinopril-hydrochlorothiazide (ZESTORETIC) 20-25 MG tablet Take 1 tablet by mouth daily. 90 tablet 3   Multiple Vitamins-Minerals (CENTRUM SILVER 50+WOMEN) TABS Take 1 tablet by mouth daily.     NONFORMULARY OR COMPOUNDED ITEM Amantadine 8%, Baclofen 2%, Gabapentin 6%, Amitriptyline 4%, Bupivacaine 2%, Clonidine 0.2%.  Apply 1-2 grams to the affected area 3-4 times daily      ONETOUCH VERIO test strip USE AND DISCARD 1 TEST     STRIP AS NEEDED AS         INSTRUCTED 100 each 3   oxybutynin (DITROPAN-XL) 10 MG 24 hr tablet TAKE 1 TABLET AT BEDTIME 90 tablet 1   PARoxetine (PAXIL) 20 MG tablet TAKE 1 TABLET BY MOUTH EVERY DAY 90 tablet 0   Semaglutide, 1 MG/DOSE, 4 MG/3ML SOPN Inject 1 mg as directed once a week. 9 mL 0   Vitamin D, Ergocalciferol, (DRISDOL) 1.25 MG (50000 UNIT) CAPS capsule Take 1 capsule (50,000 Units total) by mouth every 14 (fourteen) days. 8 capsule 0   XARELTO 20 MG TABS tablet TAKE 1 TABLET BY MOUTH DAILY WITH SUPPER 90 tablet 0   No current facility-administered medications for this visit.   No Known Allergies   Review of Systems: Positive for blood in urine, painful urination.  All other systems reviewed and negative except where noted in HPI.   Wt Readings from Last 3 Encounters:  07/14/23 215 lb 6 oz (97.7 kg)  07/07/23 213 lb 3.2 oz (96.7 kg)  06/14/23 210 lb (95.3 kg)    Physical Exam:  BP 90/64 (BP Location: Left Arm, Patient Position: Sitting, Cuff Size: Large)   Pulse 81   Ht 5\' 11"  (1.803 m)   Wt 215 lb 6 oz (97.7 kg)   LMP 01/26/2011   BMI 30.04 kg/m  Constitutional:  Pleasant, generally well appearing female in no acute distress. Psychiatric:  Normal mood and affect. Behavior is normal. EENT: Pupils normal.  Conjunctivae are normal. No scleral icterus. Neck supple.  Cardiovascular: Normal rate,.  Pulmonary/chest: Effort normal and breath sounds normal. No wheezing, rales or rhonchi. Abdominal: Soft, nondistended, nontender. Bowel sounds active throughout. There are no masses palpable. No hepatomegaly. Neurological: Alert and oriented to person place and time.  Willette Cluster, NP  07/14/2023, 8:58 AM  Cc:  Referring Provider Ronnald Nian, MD

## 2023-07-14 NOTE — Telephone Encounter (Signed)
[facility] Pre-operative Risk Assessment     Request for surgical clearance:     Endoscopy Procedure  What type of surgery is being performed?     Colonoscopy  When is this surgery scheduled?     08-03-2023  What type of clearance is required ?   Pharmacy  Are there any medications that need to be held prior to surgery and how long? 2 Day hold prior  Practice name and name of physician performing surgery?      [facility]  What is your office phone and fax number?      Phone- [phone]  Fax- [phone]  Anesthesia type (None, local, MAC, general) ?       [facility]

## 2023-07-15 ENCOUNTER — Encounter (INDEPENDENT_AMBULATORY_CARE_PROVIDER_SITE_OTHER): Payer: Self-pay | Admitting: Adult Health

## 2023-07-15 ENCOUNTER — Ambulatory Visit (INDEPENDENT_AMBULATORY_CARE_PROVIDER_SITE_OTHER): Payer: BC Managed Care – PPO | Admitting: Adult Health

## 2023-07-15 VITALS — BP 112/71 | HR 45 | Temp 97.9°F | Ht 71.0 in | Wt 209.0 lb

## 2023-07-15 DIAGNOSIS — N939 Abnormal uterine and vaginal bleeding, unspecified: Secondary | ICD-10-CM | POA: Diagnosis not present

## 2023-07-15 DIAGNOSIS — Z6829 Body mass index (BMI) 29.0-29.9, adult: Secondary | ICD-10-CM

## 2023-07-15 DIAGNOSIS — E559 Vitamin D deficiency, unspecified: Secondary | ICD-10-CM

## 2023-07-15 DIAGNOSIS — E669 Obesity, unspecified: Secondary | ICD-10-CM

## 2023-07-15 DIAGNOSIS — E1140 Type 2 diabetes mellitus with diabetic neuropathy, unspecified: Secondary | ICD-10-CM

## 2023-07-15 DIAGNOSIS — Z7985 Long-term (current) use of injectable non-insulin antidiabetic drugs: Secondary | ICD-10-CM

## 2023-07-15 NOTE — Progress Notes (Signed)
WEIGHT SUMMARY AND BIOMETRICS  Vitals Temp: 97.9 F (36.6 C) BP: 112/71 Pulse Rate: (!) 45 SpO2: 92 %   Anthropometric Measurements Height: 5\' 11"  (1.803 m) Weight: 209 lb (94.8 kg) BMI (Calculated): 29.16 Weight at Last Visit: 210lb Weight Lost Since Last Visit: 1lb Weight Gained Since Last Visit: 0 Starting Weight: 268lb Total Weight Loss (lbs): 49 lb (22.2 kg)   Body Composition  Body Fat %: 39.7 % Fat Mass (lbs): 83.2 lbs Muscle Mass (lbs): 119.8 lbs Total Body Water (lbs): 86.6 lbs Visceral Fat Rating : 10   Other Clinical Data Fasting: no Labs: no Today's Visit #: 34 Starting Date: 11/14/20    Chief Complaint:   OBESITY Laurie Mckinney is here to discuss her progress with her obesity treatment plan. She is on the the Category 3 Plan and states she is following her eating plan approximately 35 % of the time. She states she is exercising Brisk walking 30 minutes 7 times per week.   Interim History:  Laurie Mckinney is keeping her nephew's dog, "Nessy" It has been a challenge to walk her "Sugar" and "Nessy" at the same time. She reports getting less steps daily.  She has started drinking and iced, mushroom coffee each morning for the last 2 months. She reports improved elimination, increased BMs (stool is softer and several movements per week).  Laurie Mckinney reports consuming more simple CHO, ie: bread and pasta  Subjective:   1. Type 2 diabetes mellitus with diabetic neuropathy, without long-term current use of insulin (HCC) Lab Results  Component Value Date   HGBA1C 5.8 (A) 03/23/2023   HGBA1C 5.8 (H) 10/29/2022   HGBA1C 5.9 (H) 08/03/2022    She is on weekly Ozempic 1mg  Denies mass in neck, dysphagia, dyspepsia, persistent hoarseness, abdominal pain, or N/V/worsening Constipation   2. Vitamin D deficiency  Latest Reference Range & Units 03/23/23 11:59  Vitamin D, 25-Hydroxy 30.0 - 100.0 ng/mL 68.8   She is on bi-weekly Ergocalciferol- denies  N/V/Muscle Weakness  3. Vaginal bleeding, abnormal 07/06/2023 text/html HPI Notes: 62yo, P2012, here for annual gyn exam. Had RA hyst/BSO in 2016 for IA endometrial cancer. Overall doing well, no pain. Has still had some bleeding off and on with up to dime size clots. Has finished abx for UTI. Not sexually active. Getting mammogram today   She completed course of ABX from GYN She reports bleeding has since stoppedon/about 07/12/2023  Assessment/Plan:   1. Type 2 diabetes mellitus with diabetic neuropathy, without long-term current use of insulin (HCC) Continue weekly Ozempic-does not require RF today  2. Vitamin D deficiency Continue bi-weekly Ergocalciferol- does not require RF today  3. Vaginal bleeding, abnormal Monitor sx's and f/u with GYM PRN  4. Obesity (BMI 30-39.9), Current BMI 29.16  Laurie Mckinney is currently in the action stage of change. As such, her goal is to continue with weight loss efforts. She has agreed to the Category 3 Plan.   Exercise goals: For substantial health benefits, adults should do at least 150 minutes (2 hours and 30 minutes) a week of moderate-intensity, or 75 minutes (1 hour and 15 minutes) a week of vigorous-intensity aerobic physical activity, or an equivalent combination of moderate- and vigorous-intensity aerobic activity. Aerobic activity should be performed in episodes of at least 10 minutes, and preferably, it should be spread throughout the week.  Behavioral modification strategies: increasing lean protein intake, decreasing simple carbohydrates, increasing vegetables, increasing water intake, decreasing liquid calories, no skipping meals, meal planning and cooking  strategies, keeping healthy foods in the home, and planning for success.  [name] has agreed to follow-up with our clinic in 4 weeks. She was informed of the importance of frequent follow-up visits to maximize her success with intensive lifestyle modifications for her multiple health  conditions.   Objective:   Blood pressure 112/71, pulse (!) 45, temperature 97.9 F (36.6 C), height 5\' 11"  (1.803 m), weight 209 lb (94.8 kg), last menstrual period 01/26/2011, SpO2 92%. Body mass index is 29.15 kg/m.  General: Cooperative, alert, well developed, in no acute distress. HEENT: Conjunctivae and lids unremarkable. Cardiovascular: Regular rhythm.  Lungs: Normal work of breathing. Neurologic: No focal deficits.   Lab Results  Component Value Date   CREATININE 0.73 03/23/2023   BUN 13 03/23/2023   NA 141 03/23/2023   K 4.3 03/23/2023   CL 104 03/23/2023   CO2 25 03/23/2023   Lab Results  Component Value Date   ALT 18 03/23/2023   AST 21 03/23/2023   ALKPHOS 87 03/23/2023   BILITOT 0.5 03/23/2023   Lab Results  Component Value Date   HGBA1C 5.8 (A) 03/23/2023   HGBA1C 5.8 (H) 10/29/2022   HGBA1C 5.9 (H) 08/03/2022   HGBA1C 5.5 03/09/2022   HGBA1C 5.8 (A) 12/18/2021   Lab Results  Component Value Date   INSULIN 12.6 11/30/2022   INSULIN 9.7 05/13/2021   INSULIN 4.0 11/14/2020   Lab Results  Component Value Date   TSH 0.724 11/14/2020   Lab Results  Component Value Date   CHOL 121 03/23/2023   HDL 75 03/23/2023   LDLCALC 36 03/23/2023   TRIG 38 03/23/2023   CHOLHDL 1.6 03/23/2023   Lab Results  Component Value Date   VD25OH 68.8 03/23/2023   VD25OH 29.8 (L) 11/30/2022   VD25OH 40.5 11/14/2020   Lab Results  Component Value Date   WBC 5.9 03/23/2023   HGB 13.8 03/23/2023   HCT 41.7 03/23/2023   MCV 91 03/23/2023   PLT 249 03/23/2023   No results found for: "IRON", "TIBC", "FERRITIN"  Attestation Statements:   Reviewed by clinician on day of visit: allergies, medications, problem list, medical history, surgical history, family history, social history, and previous encounter notes.  Time spent on visit including pre-visit chart review and post-visit care and charting was 29 minutes.   I have reviewed the above documentation for  accuracy and completeness, and I agree with the above. -  Jatara Huettner d. Minaal Struckman, NP-C

## 2023-07-15 NOTE — Telephone Encounter (Signed)
Patient made aware not to take xarelto 10-27- and 10-28 and after the procedure they will tell her when to start it back and she voiced understanding

## 2023-07-15 NOTE — Telephone Encounter (Signed)
Patient with diagnosis of atrial fibrillation on Xarelto for anticoagulation.    Procedure: colonoscopy Date of procedure: 08/03/23   CHA2DS2-VASc Score = 3   This indicates a 3.2% annual risk of stroke. The patient's score is based upon: CHF History: 0 HTN History: 1 Diabetes History: 1 Stroke History: 0 Vascular Disease History: 0 Age Score: 0 Gender Score: 1    CrCl > 100 Platelet count 249  Per office protocol, patient can hold Xarelto for 2 days prior to procedure.   Patient will not need bridging with Lovenox (enoxaparin) around procedure.  **This guidance is not considered finalized until pre-operative APP has relayed final recommendations.**

## 2023-07-15 NOTE — Telephone Encounter (Signed)
Patient Name: Laurie Mckinney  DOB: [birth year] MRN: [id number]  Primary Cardiologist: Dina Rich, MD  Clinical pharmacists have reviewed the patient's past medical history, labs, and current medications as part of preoperative protocol coverage. The following recommendations have been made:  Patient with diagnosis of atrial fibrillation on Xarelto for anticoagulation.     Procedure: colonoscopy Date of procedure: 08/03/23     CHA2DS2-VASc Score = 3   This indicates a 3.2% annual risk of stroke. The patient's score is based upon: CHF History: 0 HTN History: 1 Diabetes History: 1 Stroke History: 0 Vascular Disease History: 0 Age Score: 0 Gender Score: 1     CrCl > 100 Platelet count 249   Per office protocol, patient can hold Xarelto for 2 days prior to procedure.   Patient will not need bridging with Lovenox (enoxaparin) around procedure. Please resume Xarelto as soon as possible postprocedure, at the discretion of the surgeon.    I will route this recommendation to the requesting party via Epic fax function and remove from pre-op pool.  Please call with questions.  Joylene Grapes, NP 07/15/2023, 7:28 AM

## 2023-07-20 ENCOUNTER — Encounter: Payer: Self-pay | Admitting: Internal Medicine

## 2023-07-20 DIAGNOSIS — G4733 Obstructive sleep apnea (adult) (pediatric): Secondary | ICD-10-CM | POA: Diagnosis not present

## 2023-08-03 ENCOUNTER — Encounter: Payer: Self-pay | Admitting: Internal Medicine

## 2023-08-03 ENCOUNTER — Ambulatory Visit: Payer: BC Managed Care – PPO | Admitting: Internal Medicine

## 2023-08-03 VITALS — BP 120/70 | HR 67 | Temp 98.0°F | Resp 18 | Ht 71.0 in | Wt 215.0 lb

## 2023-08-03 DIAGNOSIS — D123 Benign neoplasm of transverse colon: Secondary | ICD-10-CM | POA: Diagnosis not present

## 2023-08-03 DIAGNOSIS — D120 Benign neoplasm of cecum: Secondary | ICD-10-CM | POA: Diagnosis not present

## 2023-08-03 DIAGNOSIS — K635 Polyp of colon: Secondary | ICD-10-CM | POA: Diagnosis not present

## 2023-08-03 DIAGNOSIS — R195 Other fecal abnormalities: Secondary | ICD-10-CM | POA: Diagnosis not present

## 2023-08-03 DIAGNOSIS — Z1211 Encounter for screening for malignant neoplasm of colon: Secondary | ICD-10-CM

## 2023-08-03 MED ORDER — SODIUM CHLORIDE 0.9 % IV SOLN: 500.0000 mL | Freq: Once | INTRAVENOUS | Status: DC

## 2023-08-03 NOTE — Op Note (Signed)
[facility] Patient Name: Laurie Mckinney Procedure Date: 08/03/2023 9:36 AM MRN: [id number] Endoscopist: Madelyn Brunner [place] , , [id number] Age: 62 Referring MD:  Date of Birth: August 15, 1961 Gender: Female Account #: [id number] Procedure:                Colonoscopy Indications:              Positive Cologuard test Medicines:                Monitored Anesthesia Care Procedure:                Pre-Anesthesia Assessment:                           - Prior to the procedure, a History and Physical                            was performed, and patient medications and                            allergies were reviewed. The patient's tolerance of                            previous anesthesia was also reviewed. The risks                            and benefits of the procedure and the sedation                            options and risks were discussed with the patient.                            All questions were answered, and informed consent                            was obtained. Prior Anticoagulants: The patient has                            taken Xarelto (rivaroxaban), last dose was 2 days                            prior to procedure. ASA Grade Assessment: II - A                            patient with mild systemic disease. After reviewing                            the risks and benefits, the patient was deemed in                            satisfactory condition to undergo the procedure.                           After obtaining informed consent, the colonoscope  was passed under direct vision. Throughout the                            procedure, the patient's blood pressure, pulse, and                            oxygen saturations were monitored continuously. The                            Olympus CF-HQ190L ([id number]) Colonoscope was                            introduced through the anus and advanced to the the                             terminal ileum. The colonoscopy was performed                            without difficulty. The patient tolerated the                            procedure well. The quality of the bowel                            preparation was good. The terminal ileum, ileocecal                            valve, appendiceal orifice, and rectum were                            photographed. Scope In: 9:43:19 AM Scope Out: 10:06:21 AM Scope Withdrawal Time: 0 hours 17 minutes 13 seconds  Total Procedure Duration: 0 hours 23 minutes 2 seconds  Findings:                 The terminal ileum appeared normal.                           Three sessile polyps were found in the transverse                            colon and cecum. The polyps were 3 to 6 mm in size.                            These polyps were removed with a cold snare.                            Resection and retrieval were complete.                           Non-bleeding internal hemorrhoids were found during                            retroflexion. Complications:  No immediate complications. Estimated Blood Loss:     Estimated blood loss was minimal. Impression:               - The examined portion of the ileum was normal.                           - Three 3 to 6 mm polyps in the transverse colon                            and in the cecum, removed with a cold snare.                            Resected and retrieved.                           - Non-bleeding internal hemorrhoids. Recommendation:           - Discharge patient to home (with escort).                           - Await pathology results.                           - Okay to restart Xarelto tomorrow.                           - The findings and recommendations were discussed                            with the patient. Dr Particia Lather "[place]" Leonides Schanz,  08/03/2023 10:11:09 AM

## 2023-08-03 NOTE — Progress Notes (Signed)
Report to PACU, RN, vss, BBS= Clear.  

## 2023-08-03 NOTE — Patient Instructions (Signed)
Resume your xarelto tomorrow at previous dose.  Resume all of your previous medications today as ordered.  Read your discharge instructions.  YOU HAD AN ENDOSCOPIC PROCEDURE TODAY AT THE [facility]:   Refer to the procedure report that was given to you for any specific questions about what was found during the examination.  If the procedure report does not answer your questions, please call your gastroenterologist to clarify.  If you requested that your care partner not be given the details of your procedure findings, then the procedure report has been included in a sealed envelope for you to review at your convenience later.  YOU SHOULD EXPECT: Some feelings of bloating in the abdomen. Passage of more gas than usual.  Walking can help get rid of the air that was put into your GI tract during the procedure and reduce the bloating. If you had a lower endoscopy (such as a colonoscopy or flexible sigmoidoscopy) you may notice spotting of blood in your stool or on the toilet paper. If you underwent a bowel prep for your procedure, you may not have a normal bowel movement for a few days.  Please Note:  You might notice some irritation and congestion in your nose or some drainage.  This is from the oxygen used during your procedure.  There is no need for concern and it should clear up in a day or so.  SYMPTOMS TO REPORT IMMEDIATELY:  Following lower endoscopy (colonoscopy or flexible sigmoidoscopy):  Excessive amounts of blood in the stool  Significant tenderness or worsening of abdominal pains  Swelling of the abdomen that is new, acute  Fever of 100F or higher  For urgent or emergent issues, a gastroenterologist can be reached at any hour by calling [phone]. Do not use MyChart messaging for urgent concerns.    DIET:  We do recommend a small meal at first, but then you may proceed to your regular diet.  Drink plenty of fluids but you should avoid alcoholic beverages for 24  hours. Try to increase the fiber in your diet, and drink plenty of water.  ACTIVITY:  You should plan to take it easy for the rest of today and you should NOT DRIVE or use heavy machinery until tomorrow (because of the sedation medicines used during the test).    FOLLOW UP: Our staff will call the number listed on your records the next business day following your procedure.  We will call around 7:15- 8:00 am to check on you and address any questions or concerns that you may have regarding the information given to you following your procedure. If we do not reach you, we will leave a message.     If any biopsies were taken you will be contacted by phone or by letter within the next 1-3 weeks.  Please call us at [phone] if you have not heard about the biopsies in 3 weeks.    SIGNATURES/CONFIDENTIALITY: You and/or your care partner have signed paperwork which will be entered into your electronic medical record.  These signatures attest to the fact that that the information above on your After Visit Summary has been reviewed and is understood.  Full responsibility of the confidentiality of this discharge information lies with you and/or your care-partner.

## 2023-08-03 NOTE — Progress Notes (Signed)
Called to room to assist during endoscopic procedure.  Patient ID and intended procedure confirmed with present staff. Received instructions for my participation in the procedure from the performing physician.  

## 2023-08-03 NOTE — Progress Notes (Signed)
GASTROENTEROLOGY PROCEDURE H&P NOTE   Primary Care Physician: Ronnald Nian, MD    Reason for Procedure:   Positive Cologuard  Plan:    Colonoscopy  Patient is appropriate for endoscopic procedure(s) in the ambulatory ([facility]) setting.  The nature of the procedure, as well as the risks, benefits, and alternatives were carefully and thoroughly reviewed with the patient. Ample time for discussion and questions allowed. The patient understood, was satisfied, and agreed to proceed.     HPI: Laurie Mckinney is a 62 y.o. female who presents for colonoscopy for evaluation of positive Cologuard .  Patient was most recently seen in the Gastroenterology Clinic on 07/14/23.  No interval change in medical history since that appointment. Please refer to that note for full details regarding GI history and clinical presentation.   Past Medical History:  Diagnosis Date   Adjustment insomnia    SHIFT WORK   Allergy    RHINITIS   Asthma    Atrial flutter (HCC) 02/26/2015   [facility]   Back pain    Bursitis    right hip   Cataract    see last eye eexam note    Constipation    Depression    Diabetes mellitus without complication (HCC)    History of atrial fibrillation    History of cancer    Hypertension    Incontinence    Joint pain    Migraine headache    Mobitz type 2 second degree heart block    Neuropathy    Obesity    Other fatigue    Shortness of breath on exertion    Sleep apnea     Past Surgical History:  Procedure Laterality Date   ABDOMINAL HYSTERECTOMY     ATRIAL FIBRILLATION ABLATION N/A 10/27/2019   Procedure: ATRIAL FIBRILLATION ABLATION;  Surgeon: Regan Lemming, MD;  Location: [facility];  Service: Cardiovascular;  Laterality: N/A;   BREAST REDUCTION SURGERY Bilateral 11/19/2022   Procedure: BREAST REDUCTION WITH LIPOSUCTION;  Surgeon: Peggye Form, DO;  Location: [facility];  Service: Plastics;   Laterality: Bilateral;   CARDIOVERSION N/A 05/25/2019   Procedure: CARDIOVERSION;  Surgeon: Antoine Poche, MD;  Location: [facility];  Service: Endoscopy;  Laterality: N/A;   COLONOSCOPY  2011   Dr.Brodie   ROBOTIC ASSISTED TOTAL HYSTERECTOMY WITH BILATERAL SALPINGO OOPHERECTOMY Bilateral 02/05/2015   Procedure: ROBOTIC ASSISTED TOTAL LAPAROSCOPIC HYSTERECTOMY WITH BILATERAL SALPINGO OOPHORECTOMY;  Surgeon: Adolphus Birchwood, MD;  Location: [facility];  Service: Gynecology;  Laterality: Bilateral;    Prior to Admission medications   Medication Sig Start Date End Date Taking? Authorizing Provider  atorvastatin (LIPITOR) 10 MG tablet TAKE 1 TABLET DAILY 05/17/23  Yes Ronnald Nian, MD  buPROPion [facility] SR) 200 MG 12 hr tablet TAKE 1 TABLET BY MOUTH EVERY DAY 07/07/23  Yes Ronnald Nian, MD  GEMTESA 75 MG TABS Take 1 tablet by mouth daily. 01/13/23  Yes [provider]  lisinopril-hydrochlorothiazide (ZESTORETIC) 20-25 MG tablet Take 1 tablet by mouth daily. 03/23/23  Yes Ronnald Nian, MD  Multiple Vitamins-Minerals (CENTRUM SILVER 50+WOMEN) TABS Take 1 tablet by mouth daily.   Yes [provider]  oxybutynin (DITROPAN-XL) 10 MG 24 hr tablet TAKE 1 TABLET AT BEDTIME 10/13/21  Yes Ronnald Nian, MD  PARoxetine (PAXIL) 20 MG tablet TAKE 1 TABLET BY MOUTH EVERY DAY 07/07/23  Yes Ronnald Nian, MD  Vitamin D, Ergocalciferol, (DRISDOL) 1.25 MG (50000  UNIT) CAPS capsule Take 1 capsule (50,000 Units total) by mouth every 14 (fourteen) days. 06/14/23  Yes Danford, Orpha Bur D, NP  acetaminophen (TYLENOL) 650 MG CR tablet Take 650 mg by mouth every 8 (eight) hours as needed for pain.    [provider]  Lancets [facility] DELICA PLUS [place]) MISC USE AND DISCARD 1 LANCET   DAILY 11/04/20   Ronnald Nian, MD  NONFORMULARY OR COMPOUNDED ITEM Amantadine 8%, Baclofen 2%, Gabapentin 6%, Amitriptyline 4%, Bupivacaine 2%, Clonidine 0.2%.   Apply 1-2 grams to the affected area 3-4 times daily     Butch Penny, NP  ONETOUCH VERIO test strip USE AND DISCARD 1 TEST     STRIP AS NEEDED AS         INSTRUCTED 11/04/20   Ronnald Nian, MD  Semaglutide, 1 MG/DOSE, 4 MG/3ML SOPN Inject 1 mg as directed once a week. 06/14/23   Danford, Jinny Blossom, NP  XARELTO 20 MG TABS tablet TAKE 1 TABLET BY MOUTH DAILY WITH SUPPER 04/26/23   Ronnald Nian, MD    Current Outpatient Medications  Medication Sig Dispense Refill   atorvastatin (LIPITOR) 10 MG tablet TAKE 1 TABLET DAILY 90 tablet 0   buPROPion (WELLBUTRIN SR) 200 MG 12 hr tablet TAKE 1 TABLET BY MOUTH EVERY DAY 90 tablet 0   GEMTESA 75 MG TABS Take 1 tablet by mouth daily.     lisinopril-hydrochlorothiazide (ZESTORETIC) 20-25 MG tablet Take 1 tablet by mouth daily. 90 tablet 3   Multiple Vitamins-Minerals (CENTRUM SILVER 50+WOMEN) TABS Take 1 tablet by mouth daily.     oxybutynin (DITROPAN-XL) 10 MG 24 hr tablet TAKE 1 TABLET AT BEDTIME 90 tablet 1   PARoxetine (PAXIL) 20 MG tablet TAKE 1 TABLET BY MOUTH EVERY DAY 90 tablet 0   Vitamin D, Ergocalciferol, (DRISDOL) 1.25 MG (50000 UNIT) CAPS capsule Take 1 capsule (50,000 Units total) by mouth every 14 (fourteen) days. 8 capsule 0   acetaminophen (TYLENOL) 650 MG CR tablet Take 650 mg by mouth every 8 (eight) hours as needed for pain.     Lancets (ONETOUCH DELICA PLUS LANCET33G) MISC USE AND DISCARD 1 LANCET   DAILY 100 each 3   NONFORMULARY OR COMPOUNDED ITEM Amantadine 8%, Baclofen 2%, Gabapentin 6%, Amitriptyline 4%, Bupivacaine 2%, Clonidine 0.2%.   Apply 1-2 grams to the affected area 3-4 times daily     ONETOUCH VERIO test strip USE AND DISCARD 1 TEST     STRIP AS NEEDED AS         INSTRUCTED 100 each 3   Semaglutide, 1 MG/DOSE, 4 MG/3ML SOPN Inject 1 mg as directed once a week. 9 mL 0   XARELTO 20 MG TABS tablet TAKE 1 TABLET BY MOUTH DAILY WITH SUPPER 90 tablet 0   Current Facility-Administered Medications  Medication Dose Route Frequency Provider Last Rate Last Admin   0.9 %  sodium  chloride infusion  500 mL Intravenous Once Imogene Burn, MD        Allergies as of 08/03/2023   (No Known Allergies)    Family History  Problem Relation Age of Onset   Arthritis Mother    Heart disease Father    Hypertension Father    Hyperlipidemia Father    Sudden death Father    Neuropathy Brother        both legs    Diabetes Brother        no treatment that the pt knows of    Other  Brother        drank a 6 pack of beer daily x 30 years    Sleep apnea Neg Hx    Colon cancer Neg Hx    Pancreatic cancer Neg Hx    Stomach cancer Neg Hx    Esophageal cancer Neg Hx    Rectal cancer Neg Hx     Social History   Socioeconomic History   Marital status: Married    Spouse name: Takeshia Murr   Number of children: 2   Years of education: Not on file   Highest education level: Not on file  Occupational History   Occupation: Education administrator for parent  Tobacco Use   Smoking status: Never   Smokeless tobacco: Never  Vaping Use   Vaping status: Never Used  Substance and Sexual Activity   Alcohol use: Not Currently    Alcohol/week: 1.0 standard drink of alcohol    Types: 1 Standard drinks or equivalent per week   Drug use: No   Sexual activity: Yes    Partners: Male    Comment: Has had hysterectomy.  Other Topics Concern   Not on file  Social History Narrative   Lives at home with husband   Right handed   Caffeine: 1 cup of coffee in the morning and <20 oz of soda per day   Social Determinants of Health   Financial Resource Strain: Not on file  Food Insecurity: No Food Insecurity (03/23/2023)   Hunger Vital Sign    Worried About Running Out of Food in the Last Year: Never true    Ran Out of Food in the Last Year: Never true  Transportation Needs: No Transportation Needs (03/23/2023)   PRAPARE - Administrator, Civil Service (Medical): No    Lack of Transportation (Non-Medical): No  Physical Activity: Not on file  Stress: Not on file  Social  Connections: Not on file  Intimate Partner Violence: Not At Risk (03/23/2023)   Humiliation, Afraid, Rape, and Kick questionnaire    Fear of Current or Ex-Partner: No    Emotionally Abused: No    Physically Abused: No    Sexually Abused: No    Physical Exam: Vital signs in last 24 hours: BP 128/79   Pulse 60   Temp 98 F (36.7 C) (Temporal)   Ht 5\' 11"  (1.803 m)   Wt 215 lb (97.5 kg)   LMP 01/26/2011   SpO2 97%   BMI 29.99 kg/m  GEN: NAD EYE: Sclerae anicteric ENT: MMM CV: Non-tachycardic Pulm: No increased WOB GI: Soft NEURO:  Alert & Oriented   Eulah Pont, MD [facility]   08/03/2023 9:39 AM

## 2023-08-03 NOTE — Progress Notes (Signed)
Pt's states no medical or surgical changes since previsit or office visit. 

## 2023-08-04 ENCOUNTER — Telehealth: Payer: Self-pay

## 2023-08-04 NOTE — Telephone Encounter (Signed)
No answer, unable to leave a message, B.Furqan Gosselin RN. 

## 2023-08-05 ENCOUNTER — Encounter: Payer: Self-pay | Admitting: Internal Medicine

## 2023-08-05 LAB — SURGICAL PATHOLOGY

## 2023-08-12 ENCOUNTER — Encounter (INDEPENDENT_AMBULATORY_CARE_PROVIDER_SITE_OTHER): Payer: Self-pay | Admitting: Adult Health

## 2023-08-12 ENCOUNTER — Ambulatory Visit (INDEPENDENT_AMBULATORY_CARE_PROVIDER_SITE_OTHER): Payer: BC Managed Care – PPO | Admitting: Adult Health

## 2023-08-12 VITALS — BP 111/57 | HR 63 | Temp 98.2°F | Ht 71.0 in | Wt 210.0 lb

## 2023-08-12 DIAGNOSIS — E1140 Type 2 diabetes mellitus with diabetic neuropathy, unspecified: Secondary | ICD-10-CM

## 2023-08-12 DIAGNOSIS — Z6829 Body mass index (BMI) 29.0-29.9, adult: Secondary | ICD-10-CM

## 2023-08-12 DIAGNOSIS — Z7985 Long-term (current) use of injectable non-insulin antidiabetic drugs: Secondary | ICD-10-CM | POA: Diagnosis not present

## 2023-08-12 DIAGNOSIS — E1169 Type 2 diabetes mellitus with other specified complication: Secondary | ICD-10-CM

## 2023-08-12 DIAGNOSIS — E669 Obesity, unspecified: Secondary | ICD-10-CM

## 2023-08-12 DIAGNOSIS — Z1211 Encounter for screening for malignant neoplasm of colon: Secondary | ICD-10-CM

## 2023-08-12 MED ORDER — SEMAGLUTIDE (1 MG/DOSE) 4 MG/3ML ~~LOC~~ SOPN: 1.0000 mg | PEN_INJECTOR | SUBCUTANEOUS | 0 refills | Status: DC

## 2023-08-12 NOTE — Progress Notes (Signed)
WEIGHT SUMMARY AND BIOMETRICS  Vitals Temp: 98.2 F (36.8 C) BP: (!) 111/57 Pulse Rate: 63 SpO2: 97 %   Anthropometric Measurements Height: 5\' 11"  (1.803 m) Weight: 210 lb (95.3 kg) BMI (Calculated): 29.3 Weight at Last Visit: 0 Weight Lost Since Last Visit: 0 Weight Gained Since Last Visit: 1 lb Starting Weight: 268 lb   Body Composition  Body Fat %: 39.5 % Fat Mass (lbs): 83 lbs Muscle Mass (lbs): 120.6 lbs Total Body Water (lbs): 86.8 lbs Visceral Fat Rating : 10   Other Clinical Data Fasting: no Labs: no Today's Visit #: 35 Starting Date: 11/14/20    Chief Complaint:   OBESITY Laurie Mckinney is here to discuss her progress with her obesity treatment plan. She is on the the Category 3 Plan and states she is following her eating plan approximately 40 % of the time.  She states she is exercising NEAT Activities.   Interim History:  She held 10/25 Ozempic 1mg  injection for Colonscopy  Restarted Ozempic 1mg  on 08/07/2023  Exercise-NEAT activities   Hydration-she estimates to drink 4 cups water/day Also drinks Mushroom coffee, unsweetened tea, sugar free lemonade  Subjective:   1. Screening for colon cancer + Colo guard  08/03/2023 Colonoscopy  SPECIMEN COMMENTS: 1. Positive colorectal cancer screening using cologuard test; benign neoplasm of cecum; benign neoplasm of transverse colon SPECIMEN CLINICAL INFORMATION: 1. R/O adenoma Gross Description 1. Received in formalin are tan, soft tissue fragments that are submitted in toto. 3 Number: mp, Size: 0.3 cm smallest to 0.5 cm largest, (1B) ( TA )  FINAL DIAGNOSIS        1. Surgical [P], colon, cecum, transverse, polyp (3) :       TUBULAR ADENOMA.       SESSILE SERRATED POLYP WITHOUT CYTOLOGIC DYSPLASIA.       NEGATIVE FOR HIGH-GRADE DYSPLASIA.   2. Type 2 diabetes mellitus with diabetic neuropathy, without long-term current use of insulin (HCC) Lab Results  Component Value Date   HGBA1C 5.8  (A) 03/23/2023   HGBA1C 5.8 (H) 10/29/2022   HGBA1C 5.9 (H) 08/03/2022    She is not fasting CBG She is on weekly Ozempic 1mg  injection Denies mass in neck, dysphagia, dyspepsia, persistent hoarseness, abdominal pain, or N/V/C   Assessment/Plan:   1. Screening for colon cancer F/u with GI as directed   2. Type 2 diabetes mellitus with diabetic neuropathy, without long-term current use of insulin (HCC) Refill  Semaglutide, 1 MG/DOSE, 4 MG/3ML SOPN Inject 1 mg as directed once a week. Dispense: 9 mL, Refills: 0 ordered    3. Obesity (BMI 30-39.9), Current BMI 29.3  Laurie Mckinney is currently in the action stage of change. As such, her goal is to continue with weight loss efforts. She has agreed to the Category 3 Plan.   Handouts: Holiday Eating Strategies  Exercise goals: All adults should avoid inactivity. Some physical activity is better than none, and adults who participate in any amount of physical activity gain some health benefits. Adults should also include muscle-strengthening activities that involve all major muscle groups on 2 or more days a week.  Behavioral modification strategies: increasing lean protein intake, decreasing simple carbohydrates, increasing vegetables, increasing water intake, decreasing liquid calories, no skipping meals, meal planning and cooking strategies, keeping healthy foods in the home, and planning for success.  [name] has agreed to follow-up with our clinic in 4 weeks. She was informed of the importance of frequent follow-up visits to maximize her success with  intensive lifestyle modifications for her multiple health conditions.   Objective:   Blood pressure (!) 111/57, pulse 63, temperature 98.2 F (36.8 C), height 5\' 11"  (1.803 m), weight 210 lb (95.3 kg), last menstrual period 01/26/2011, SpO2 97%. Body mass index is 29.29 kg/m.  General: Cooperative, alert, well developed, in no acute distress. HEENT: Conjunctivae and lids  unremarkable. Cardiovascular: Regular rhythm.  Lungs: Normal work of breathing. Neurologic: No focal deficits.   Lab Results  Component Value Date   CREATININE 0.73 03/23/2023   BUN 13 03/23/2023   NA 141 03/23/2023   K 4.3 03/23/2023   CL 104 03/23/2023   CO2 25 03/23/2023   Lab Results  Component Value Date   ALT 18 03/23/2023   AST 21 03/23/2023   ALKPHOS 87 03/23/2023   BILITOT 0.5 03/23/2023   Lab Results  Component Value Date   HGBA1C 5.8 (A) 03/23/2023   HGBA1C 5.8 (H) 10/29/2022   HGBA1C 5.9 (H) 08/03/2022   HGBA1C 5.5 03/09/2022   HGBA1C 5.8 (A) 12/18/2021   Lab Results  Component Value Date   INSULIN 12.6 11/30/2022   INSULIN 9.7 05/13/2021   INSULIN 4.0 11/14/2020   Lab Results  Component Value Date   TSH 0.724 11/14/2020   Lab Results  Component Value Date   CHOL 121 03/23/2023   HDL 75 03/23/2023   LDLCALC 36 03/23/2023   TRIG 38 03/23/2023   CHOLHDL 1.6 03/23/2023   Lab Results  Component Value Date   VD25OH 68.8 03/23/2023   VD25OH 29.8 (L) 11/30/2022   VD25OH 40.5 11/14/2020   Lab Results  Component Value Date   WBC 5.9 03/23/2023   HGB 13.8 03/23/2023   HCT 41.7 03/23/2023   MCV 91 03/23/2023   PLT 249 03/23/2023   No results found for: "IRON", "TIBC", "FERRITIN"  Attestation Statements:   Reviewed by clinician on day of visit: allergies, medications, problem list, medical history, surgical history, family history, social history, and previous encounter notes.  I have reviewed the above documentation for accuracy and completeness, and I agree with the above. -  Lynze Reddy d. Magdalyn Arenivas, NP-C

## 2023-08-23 ENCOUNTER — Other Ambulatory Visit: Payer: Self-pay | Admitting: Family Medicine

## 2023-08-23 DIAGNOSIS — E785 Hyperlipidemia, unspecified: Secondary | ICD-10-CM

## 2023-08-31 DIAGNOSIS — B351 Tinea unguium: Secondary | ICD-10-CM | POA: Diagnosis not present

## 2023-08-31 DIAGNOSIS — E1142 Type 2 diabetes mellitus with diabetic polyneuropathy: Secondary | ICD-10-CM | POA: Diagnosis not present

## 2023-08-31 DIAGNOSIS — L84 Corns and callosities: Secondary | ICD-10-CM | POA: Diagnosis not present

## 2023-08-31 DIAGNOSIS — M79676 Pain in unspecified toe(s): Secondary | ICD-10-CM | POA: Diagnosis not present

## 2023-09-02 ENCOUNTER — Other Ambulatory Visit (INDEPENDENT_AMBULATORY_CARE_PROVIDER_SITE_OTHER): Payer: Self-pay | Admitting: Adult Health

## 2023-09-16 ENCOUNTER — Ambulatory Visit (INDEPENDENT_AMBULATORY_CARE_PROVIDER_SITE_OTHER): Payer: BC Managed Care – PPO | Admitting: Adult Health

## 2023-09-16 ENCOUNTER — Encounter (INDEPENDENT_AMBULATORY_CARE_PROVIDER_SITE_OTHER): Payer: Self-pay | Admitting: Adult Health

## 2023-09-16 VITALS — BP 127/75 | HR 53 | Temp 97.7°F | Ht 71.0 in | Wt 211.0 lb

## 2023-09-16 DIAGNOSIS — Z1211 Encounter for screening for malignant neoplasm of colon: Secondary | ICD-10-CM

## 2023-09-16 DIAGNOSIS — E1169 Type 2 diabetes mellitus with other specified complication: Secondary | ICD-10-CM

## 2023-09-16 DIAGNOSIS — Z7985 Long-term (current) use of injectable non-insulin antidiabetic drugs: Secondary | ICD-10-CM | POA: Diagnosis not present

## 2023-09-16 DIAGNOSIS — E669 Obesity, unspecified: Secondary | ICD-10-CM

## 2023-09-16 DIAGNOSIS — Z6829 Body mass index (BMI) 29.0-29.9, adult: Secondary | ICD-10-CM | POA: Diagnosis not present

## 2023-09-16 MED ORDER — SEMAGLUTIDE (1 MG/DOSE) 4 MG/3ML ~~LOC~~ SOPN: 1.0000 mg | PEN_INJECTOR | SUBCUTANEOUS | 0 refills | Status: DC

## 2023-09-16 MED ORDER — VITAMIN D (ERGOCALCIFEROL) 1.25 MG (50000 UNIT) PO CAPS: 50000.0000 [IU] | ORAL_CAPSULE | ORAL | 0 refills | Status: DC

## 2023-09-16 NOTE — Progress Notes (Signed)
WEIGHT SUMMARY AND BIOMETRICS  Vitals Temp: 97.7 F (36.5 C) BP: 127/75 Pulse Rate: (!) 53 SpO2: 98 %   Anthropometric Measurements Height: 5\' 11"  (1.803 m) Weight: 211 lb (95.7 kg) BMI (Calculated): 29.44 Weight at Last Visit: 210 lb Weight Lost Since Last Visit: 0 Weight Gained Since Last Visit: 1 lb Starting Weight: 268 lb Total Weight Loss (lbs): 47 lb (21.3 kg) Peak Weight: 286 lb   Body Composition  Body Fat %: 40.2 % Fat Mass (lbs): 84.8 lbs Muscle Mass (lbs): 119.8 lbs Total Body Water (lbs): 86.4 lbs Visceral Fat Rating : 10   Other Clinical Data Fasting: no Labs: no Today's Visit #: 36 Starting Date: 11/14/20    Chief Complaint:   OBESITY Laurie Mckinney is here to discuss her progress with her obesity treatment plan. She is on the the Category 3 Plan and states she is following her eating plan approximately 40 % of the time.  She states she is exercising Walking and Chair Exercises 30-40 minutes 1-2 times per week.   Interim History:  2025 Health Goals: 1) To increase muscle mass and "tone up" more 2) To become more active 3) Eat healthier  Hunger/appetite-stable appetite- weekly Ozempic 1mg   Exercise-Walking and Chair Exercises at least 1-2 times per week  Hydration-she estimates to drink at least 60-64 oz/day  Reviewed Bioimpedance results with pt: Muscle Mass: -0.8 lbs Adipose Mass: +1.8 lbs  Subjective:   1. Type 2 diabetes mellitus with other specified complication, without long-term current use of insulin (HCC) She has not been checking CBG at home  She will very infrequently experience "shakiness"- she will not check CBG, simply consumes peanut butter crackers or spoonful of peanut - sx's will resolve quickly She is weekly Ozempic 1mg  Denies mass in neck, dysphagia, dyspepsia, persistent hoarseness, abdominal pain, or N/V/worsening constipation  2. Screening for colon cancer She denies known family hx of colon ca 08/03/2023  Procedure Note HPI: Laurie Mckinney is a 62 y.o. female who presents for colonoscopy for evaluation of positive Cologuard .  Patient was most recently seen in the Gastroenterology Clinic on 07/14/23.  No interval change in medical history since that appointment. Please refer to that note for full details regarding GI history and clinical presentation.   08/03/2023 C-Scope Impression:               - The examined portion of the ileum was normal.                           - Three 3 to 6 mm polyps in the transverse colon                            and in the cecum, removed with a cold snare.                            Resected and retrieved.                           - Non-bleeding internal hemorrhoids. Recommendation:           - Discharge patient to home (with escort).                           - Await pathology results.                           -  Okay to restart Xarelto tomorrow.                           - The findings and recommendations were discussed                            with the patient.  She believes that she will need to repeat study 5 years  Assessment/Plan:   1. Type 2 diabetes mellitus with other specified complication, without long-term current use of insulin (HCC) Refill - Semaglutide, 1 MG/DOSE, 4 MG/3ML SOPN; Inject 1 mg as directed once a week.  Dispense: 9 mL; Refill: 0  2. Screening for colon cancer Repeat Study per GI Monitor for sx's  3. Obesity (BMI 30-39.9), Current BMI 29.4  Laurie Mckinney is currently in the action stage of change. As such, her goal is to continue with weight loss efforts. She has agreed to the Category 3 Plan.   Exercise goals: For substantial health benefits, adults should do at least 150 minutes (2 hours and 30 minutes) a week of moderate-intensity, or 75 minutes (1 hour and 15 minutes) a week of vigorous-intensity aerobic physical activity, or an equivalent combination of moderate- and vigorous-intensity aerobic activity. Aerobic activity  should be performed in episodes of at least 10 minutes, and preferably, it should be spread throughout the week.  Behavioral modification strategies: increasing lean protein intake, decreasing simple carbohydrates, increasing vegetables, increasing water intake, no skipping meals, meal planning and cooking strategies, keeping healthy foods in the home, ways to avoid boredom eating, travel eating strategies, holiday eating strategies , and planning for success.  Suzen has agreed to follow-up with our clinic in 4 weeks. She was informed of the importance of frequent follow-up visits to maximize her success with intensive lifestyle modifications for her multiple health conditions.   Check Fasting Labs at next OV  Objective:   Blood pressure 127/75, pulse (!) 53, temperature 97.7 F (36.5 C), height 5\' 11"  (1.803 m), weight 211 lb (95.7 kg), last menstrual period 01/26/2011, SpO2 98%. Body mass index is 29.43 kg/m.  General: Cooperative, alert, well developed, in no acute distress. HEENT: Conjunctivae and lids unremarkable. Cardiovascular: Regular rhythm.  Lungs: Normal work of breathing. Neurologic: No focal deficits.   Lab Results  Component Value Date   CREATININE 0.73 03/23/2023   BUN 13 03/23/2023   NA 141 03/23/2023   K 4.3 03/23/2023   CL 104 03/23/2023   CO2 25 03/23/2023   Lab Results  Component Value Date   ALT 18 03/23/2023   AST 21 03/23/2023   ALKPHOS 87 03/23/2023   BILITOT 0.5 03/23/2023   Lab Results  Component Value Date   HGBA1C 5.8 (A) 03/23/2023   HGBA1C 5.8 (H) 10/29/2022   HGBA1C 5.9 (H) 08/03/2022   HGBA1C 5.5 03/09/2022   HGBA1C 5.8 (A) 12/18/2021   Lab Results  Component Value Date   INSULIN 12.6 11/30/2022   INSULIN 9.7 05/13/2021   INSULIN 4.0 11/14/2020   Lab Results  Component Value Date   TSH 0.724 11/14/2020   Lab Results  Component Value Date   CHOL 121 03/23/2023   HDL 75 03/23/2023   LDLCALC 36 03/23/2023   TRIG 38  03/23/2023   CHOLHDL 1.6 03/23/2023   Lab Results  Component Value Date   VD25OH 68.8 03/23/2023   VD25OH 29.8 (L) 11/30/2022   VD25OH 40.5 11/14/2020   Lab Results  Component  Value Date   WBC 5.9 03/23/2023   HGB 13.8 03/23/2023   HCT 41.7 03/23/2023   MCV 91 03/23/2023   PLT 249 03/23/2023   No results found for: "IRON", "TIBC", "FERRITIN"  Attestation Statements:   Reviewed by clinician on day of visit: allergies, medications, problem list, medical history, surgical history, family history, social history, and previous encounter notes.  I have reviewed the above documentation for accuracy and completeness, and I agree with the above. -  Jacquetta Polhamus d. Jawana Reagor, NP-C

## 2023-10-19 ENCOUNTER — Ambulatory Visit (INDEPENDENT_AMBULATORY_CARE_PROVIDER_SITE_OTHER): Payer: BC Managed Care – PPO | Admitting: Adult Health

## 2023-11-04 ENCOUNTER — Ambulatory Visit (INDEPENDENT_AMBULATORY_CARE_PROVIDER_SITE_OTHER): Payer: BC Managed Care – PPO | Admitting: Adult Health

## 2023-11-04 ENCOUNTER — Encounter (INDEPENDENT_AMBULATORY_CARE_PROVIDER_SITE_OTHER): Payer: Self-pay | Admitting: Adult Health

## 2023-11-04 VITALS — BP 130/76 | HR 47 | Temp 97.7°F | Ht 71.0 in | Wt 213.0 lb

## 2023-11-04 DIAGNOSIS — E669 Obesity, unspecified: Secondary | ICD-10-CM

## 2023-11-04 DIAGNOSIS — Z6829 Body mass index (BMI) 29.0-29.9, adult: Secondary | ICD-10-CM

## 2023-11-04 DIAGNOSIS — E1169 Type 2 diabetes mellitus with other specified complication: Secondary | ICD-10-CM

## 2023-11-04 DIAGNOSIS — Z7985 Long-term (current) use of injectable non-insulin antidiabetic drugs: Secondary | ICD-10-CM

## 2023-11-04 DIAGNOSIS — I152 Hypertension secondary to endocrine disorders: Secondary | ICD-10-CM

## 2023-11-04 DIAGNOSIS — E1159 Type 2 diabetes mellitus with other circulatory complications: Secondary | ICD-10-CM

## 2023-11-04 DIAGNOSIS — Z0000 Encounter for general adult medical examination without abnormal findings: Secondary | ICD-10-CM

## 2023-11-04 DIAGNOSIS — E559 Vitamin D deficiency, unspecified: Secondary | ICD-10-CM | POA: Diagnosis not present

## 2023-11-04 MED ORDER — VITAMIN D (ERGOCALCIFEROL) 1.25 MG (50000 UNIT) PO CAPS: 50000.0000 [IU] | ORAL_CAPSULE | ORAL | 0 refills | Status: DC

## 2023-11-04 MED ORDER — SEMAGLUTIDE (1 MG/DOSE) 4 MG/3ML ~~LOC~~ SOPN: 1.0000 mg | PEN_INJECTOR | SUBCUTANEOUS | 0 refills | Status: DC

## 2023-11-04 NOTE — Progress Notes (Signed)
WEIGHT SUMMARY AND BIOMETRICS  Vitals Temp: 97.7 F (36.5 C) BP: 130/76 Pulse Rate: (!) 47 SpO2: 99 %   Anthropometric Measurements Height: 5\' 11"  (1.803 m) Weight: 213 lb (96.6 kg) BMI (Calculated): 29.72 Weight at Last Visit: 211lb Weight Lost Since Last Visit: 0 Weight Gained Since Last Visit: 2lb Starting Weight: 268lb Total Weight Loss (lbs): 45 lb (20.4 kg) Peak Weight: 286lb   Body Composition  Body Fat %: 41.2 % Fat Mass (lbs): 88 lbs Muscle Mass (lbs): 119.2 lbs Total Body Water (lbs): 88 lbs Visceral Fat Rating : 11   Other Clinical Data Fasting: yes Labs: yes Today's Visit #: 37 Starting Date: 11/14/20    Chief Complaint:   OBESITY Laurie Mckinney is here to discuss her progress with her obesity treatment plan. She is on the the Category 3 Plan and states she is following her eating plan approximately 35 % of the time.  She states she is exercising Chair Yoga/Walking 30 minutes 5-7 times per week.   Interim History:  Her husband is semi-retired, he works PT at [employer] as a Dentist. 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  EPIC review:  11/14/20 08:00  Height 5\' 11"  (1.803 m)  Weight 268 lb (121.6 kg)  BMI (Calculated) 37.39    11/04/23 10:00  Height 5\' 11"  (1.803 m)  Weight 213 lb (96.6 kg)  BMI (Calculated) 29.72   TOTAL WEIGHT LOSS 55 lbs  Subjective:   1. Hypertension associated with diabetes (HCC) BP stable at OV  2. Type 2 diabetes mellitus with other specified complication, without long-term current use of insulin (HCC) Lab Results  Component Value Date   HGBA1C 5.8 (A) 03/23/2023   HGBA1C 5.8 (H) 10/29/2022   HGBA1C 5.9 (H) 08/03/2022    She is not checking home CBG. She reports one episode of hypoglycemia, she had not eaten for an extended period.  She was able to treat sx's with immediate ingestion of cheese and crackers. She is on weekly Ozempic 1mg  Denies  mass in neck, dysphagia, dyspepsia, persistent hoarseness, abdominal pain, or N/V/C   4. Vitamin D deficiency She is taking bi-weekly Ergocalciferol- denies N/V/Muscle Weakness  Assessment/Plan:   1. Hypertension associated with diabetes (HCC) Limit Na+ in diet Increase plain water intake Increase regular activity  2. Type 2 diabetes mellitus with other specified complication, without long-term current use of insulin (HCC) Check Labs and Refill - Hemoglobin A1c - Semaglutide, 1 MG/DOSE, 4 MG/3ML SOPN; Inject 1 mg as directed once a week.  Dispense: 9 mL; Refill: 0  Ensure to eat every few hours  4. Vitamin D deficiency Check Labs and Refill - VITAMIN D 25 Hydroxy (Vit-D Deficiency, Fractures) Vitamin D, Ergocalciferol, (DRISDOL) 1.25 MG (50000 UNIT) CAPS capsule Take 1 capsule (50,000 Units total) by mouth every 14 (fourteen) days. Dispense: 8 capsule, Refills: 0 ordered   5. Obesity (BMI 30-39.9), Current BMI 29.72  Bostyn is currently in the action stage of change. As such, her goal is to continue with weight loss efforts. She has agreed to the Category 3 Plan.   Exercise goals: All adults should avoid inactivity. Some physical activity is better than none, and adults who participate in any amount of physical activity gain some health benefits. Adults should also include muscle-strengthening activities that involve all major muscle groups on 2 or more days a week.  Behavioral modification strategies: increasing lean protein intake, decreasing simple carbohydrates, increasing vegetables,  increasing water intake, no skipping meals, meal planning and cooking strategies, keeping healthy foods in the home, ways to avoid boredom eating, and planning for success.  Laurie Mckinney has agreed to follow-up with our clinic in 4 weeks. She was informed of the importance of frequent follow-up visits to maximize her success with intensive lifestyle modifications for her multiple health conditions.    Tandra was informed we would discuss her lab results at her next visit unless there is a critical issue that needs to be addressed sooner. Anzal agreed to keep her next visit at the agreed upon time to discuss these results.  Objective:   Blood pressure 130/76, pulse (!) 47, temperature 97.7 F (36.5 C), height 5\' 11"  (1.803 m), weight 213 lb (96.6 kg), last menstrual period 01/26/2011, SpO2 99%. Body mass index is 29.71 kg/m.  General: Cooperative, alert, well developed, in no acute distress. HEENT: Conjunctivae and lids unremarkable. Cardiovascular: Regular rhythm.  Lungs: Normal work of breathing. Neurologic: No focal deficits.   Lab Results  Component Value Date   CREATININE 0.73 03/23/2023   BUN 13 03/23/2023   NA 141 03/23/2023   K 4.3 03/23/2023   CL 104 03/23/2023   CO2 25 03/23/2023   Lab Results  Component Value Date   ALT 18 03/23/2023   AST 21 03/23/2023   ALKPHOS 87 03/23/2023   BILITOT 0.5 03/23/2023   Lab Results  Component Value Date   HGBA1C 5.8 (A) 03/23/2023   HGBA1C 5.8 (H) 10/29/2022   HGBA1C 5.9 (H) 08/03/2022   HGBA1C 5.5 03/09/2022   HGBA1C 5.8 (A) 12/18/2021   Lab Results  Component Value Date   INSULIN 12.6 11/30/2022   INSULIN 9.7 05/13/2021   INSULIN 4.0 11/14/2020   Lab Results  Component Value Date   TSH 0.724 11/14/2020   Lab Results  Component Value Date   CHOL 121 03/23/2023   HDL 75 03/23/2023   LDLCALC 36 03/23/2023   TRIG 38 03/23/2023   CHOLHDL 1.6 03/23/2023   Lab Results  Component Value Date   VD25OH 68.8 03/23/2023   VD25OH 29.8 (L) 11/30/2022   VD25OH 40.5 11/14/2020   Lab Results  Component Value Date   WBC 5.9 03/23/2023   HGB 13.8 03/23/2023   HCT 41.7 03/23/2023   MCV 91 03/23/2023   PLT 249 03/23/2023   No results found for: "IRON", "TIBC", "FERRITIN"  Attestation Statements:   Reviewed by clinician on day of visit: allergies, medications, problem list, medical history, surgical history,  family history, social history, and previous encounter notes.  I have reviewed the above documentation for accuracy and completeness, and I agree with the above. -  Felicia Both d. Ahava Kissoon, NP-C

## 2023-11-05 LAB — HEMOGLOBIN A1C
Est. average glucose Bld gHb Est-mCnc: 126 mg/dL
Hgb A1c MFr Bld: 6 % — ABNORMAL HIGH (ref 4.8–5.6)

## 2023-11-05 LAB — VITAMIN D 25 HYDROXY (VIT D DEFICIENCY, FRACTURES): Vit D, 25-Hydroxy: 50.9 ng/mL (ref 30.0–100.0)

## 2023-11-30 ENCOUNTER — Encounter: Payer: Self-pay | Admitting: Internal Medicine

## 2023-12-07 ENCOUNTER — Ambulatory Visit (INDEPENDENT_AMBULATORY_CARE_PROVIDER_SITE_OTHER): Payer: BC Managed Care – PPO | Admitting: Adult Health

## 2023-12-07 DIAGNOSIS — M216X1 Other acquired deformities of right foot: Secondary | ICD-10-CM | POA: Diagnosis not present

## 2023-12-07 DIAGNOSIS — M79673 Pain in unspecified foot: Secondary | ICD-10-CM | POA: Diagnosis not present

## 2023-12-07 DIAGNOSIS — M216X2 Other acquired deformities of left foot: Secondary | ICD-10-CM | POA: Diagnosis not present

## 2023-12-13 ENCOUNTER — Encounter (INDEPENDENT_AMBULATORY_CARE_PROVIDER_SITE_OTHER): Payer: Self-pay

## 2023-12-14 DIAGNOSIS — M216X2 Other acquired deformities of left foot: Secondary | ICD-10-CM | POA: Diagnosis not present

## 2023-12-14 DIAGNOSIS — M216X1 Other acquired deformities of right foot: Secondary | ICD-10-CM | POA: Diagnosis not present

## 2023-12-14 DIAGNOSIS — M79673 Pain in unspecified foot: Secondary | ICD-10-CM | POA: Diagnosis not present

## 2023-12-15 ENCOUNTER — Other Ambulatory Visit: Payer: Self-pay | Admitting: Family Medicine

## 2023-12-15 DIAGNOSIS — F341 Dysthymic disorder: Secondary | ICD-10-CM

## 2023-12-15 NOTE — Telephone Encounter (Signed)
Last apt 03/23/23.

## 2023-12-15 NOTE — Telephone Encounter (Signed)
 Last apt 03/23/23 next apt 04/04/24.

## 2023-12-21 ENCOUNTER — Encounter (INDEPENDENT_AMBULATORY_CARE_PROVIDER_SITE_OTHER): Payer: Self-pay | Admitting: Internal Medicine

## 2023-12-21 ENCOUNTER — Ambulatory Visit (INDEPENDENT_AMBULATORY_CARE_PROVIDER_SITE_OTHER): Admitting: Internal Medicine

## 2023-12-21 VITALS — BP 100/64 | HR 64 | Temp 98.2°F | Ht 71.0 in | Wt 206.0 lb

## 2023-12-21 DIAGNOSIS — Z7985 Long-term (current) use of injectable non-insulin antidiabetic drugs: Secondary | ICD-10-CM

## 2023-12-21 DIAGNOSIS — E66812 Obesity, class 2: Secondary | ICD-10-CM

## 2023-12-21 DIAGNOSIS — Z6828 Body mass index (BMI) 28.0-28.9, adult: Secondary | ICD-10-CM

## 2023-12-21 DIAGNOSIS — G4733 Obstructive sleep apnea (adult) (pediatric): Secondary | ICD-10-CM | POA: Diagnosis not present

## 2023-12-21 DIAGNOSIS — E1159 Type 2 diabetes mellitus with other circulatory complications: Secondary | ICD-10-CM

## 2023-12-21 DIAGNOSIS — E1169 Type 2 diabetes mellitus with other specified complication: Secondary | ICD-10-CM | POA: Diagnosis not present

## 2023-12-21 DIAGNOSIS — I152 Hypertension secondary to endocrine disorders: Secondary | ICD-10-CM

## 2023-12-21 DIAGNOSIS — E559 Vitamin D deficiency, unspecified: Secondary | ICD-10-CM

## 2023-12-21 MED ORDER — SEMAGLUTIDE (1 MG/DOSE) 4 MG/3ML ~~LOC~~ SOPN: 1.0000 mg | PEN_INJECTOR | SUBCUTANEOUS | 0 refills | Status: DC

## 2023-12-21 MED ORDER — VITAMIN D (ERGOCALCIFEROL) 1.25 MG (50000 UNIT) PO CAPS: 50000.0000 [IU] | ORAL_CAPSULE | ORAL | 0 refills | Status: DC

## 2023-12-21 NOTE — Assessment & Plan Note (Signed)
 Diabetes well-controlled with a recent A1c of 6.0. Currently on semaglutide for diabetes management, which also aids in weight loss.  No signs or symptoms of hypoglycemia.  Concerns about long-term use and cost implications with upcoming insurance changes.  - Continue semaglutide for diabetes management.

## 2023-12-21 NOTE — Progress Notes (Signed)
 Office: [phone]  /  Fax: [phone]  Weight Summary And Biometrics  Vitals Temp: 98.2 F (36.8 C) BP: 100/64 Pulse Rate: 64 SpO2: 98 %   Anthropometric Measurements Height: 5\' 11"  (1.803 m) Weight: 206 lb (93.4 kg) BMI (Calculated): 28.74 Weight at Last Visit: 213 lb Weight Lost Since Last Visit: 7 lb Weight Gained Since Last Visit: 0 Starting Weight: 268 lb Total Weight Loss (lbs): 62 lb (28.1 kg) Peak Weight: 286 lb   Body Composition  Body Fat %: 39 % Fat Mass (lbs): 80.6 lbs Muscle Mass (lbs): 119.8 lbs Total Body Water (lbs): 86.2 lbs Visceral Fat Rating : 10    No data recorded Today's Visit #: 38  Starting Date: 11/14/20   Subjective   Chief Complaint: Obesity  Interval History Discussed the use of AI scribe software for clinical note transcription with the patient, who gave verbal consent to proceed.  History of Present Illness   Laurie WIKE "Laurie Mckinney" is a 63 year old female with hypertension, type two diabetes, and sleep apnea who presents for medical weight management.  She has lost seven pounds since her last office visit, following a category three meal plan approximately ninety percent of the time without tracking calories. She consumes more whole foods but feels her protein intake is inadequate. She maintains hydration but sometimes skips meals. Her exercise routine includes strengthening, cardio, and yoga five to seven days a week for about thirty minutes. She sleeps four to six hours per night and denies high levels of stress. Her highest weight was 286 pounds, initially losing weight through a keto diet before starting medical weight management. Her current weight is 206 pounds, down from 213 pounds at her last visit, with a goal weight of 200 pounds.  For diabetes management and secondary weight management, she is on semaglutide, one milligram once a week. She is also taking paroxetine, which could cause weight gain, and has been on  it for at least ten years since perimenopause. She attempted to taper off paroxetine herself but experienced anxiety and weakness, indicating discontinuation syndrome. She also takes Wellbutrin.  Her blood pressure was recorded at 100/64, which she describes as 'low normal.' She is on blood pressure medications, including lisinopril and hydrochlorothiazide, and has experienced lightheadedness and dizziness over the past month or six weeks. She attributes these symptoms to low blood sugar but notes her blood sugar was 107 before coffee this morning.  She uses a CPAP machine every night for sleep apnea and had a sleep study about a year ago.  She has a history of emotional eating and enjoys cooking comfort foods.        Challenges affecting patient progress: presence of obesogenic drugs.    Pharmacotherapy for weight management: She is currently taking Ozempic with diabetes as the primary indication with adequate clinical response  and without side effects..   Assessment and Plan   Treatment Plan For Obesity:  Recommended Dietary Goals  Laurie Mckinney is currently in the action stage of change. As such, her goal is to continue weight management plan. She has agreed to: continue current plan  Behavioral Health and Counseling  We discussed the following behavioral modification strategies today: continue to work on maintaining a reduced calorie state, getting the recommended amount of protein, incorporating whole foods, making healthy choices, staying well hydrated and practicing mindfulness when eating..  Additional education and resources provided today: None  Recommended Physical Activity Goals  Laurie Mckinney has been advised to work up to  150 minutes of moderate intensity aerobic activity a week and strengthening exercises 2-3 times per week for cardiovascular health, weight loss maintenance and preservation of muscle mass.   She has agreed to :  continue to gradually increase the amount and  intensity of exercise routine  Pharmacotherapy  We discussed various medication options to help Laurie Mckinney with her weight loss efforts and we both agreed to : adequate clinical response to current dose, continue current regimen  Associated Conditions Impacted by Obesity Treatment  Hypertension associated with diabetes (HCC) Assessment & Plan: Her blood pressure is low normal.  She is currently on lisinopril hydrochlorothiazide.  She notes some orthostatic dizziness particularly when gardening.  This may be related to her blood pressure.  She has lost a significant amount of weight and her blood pressure medications may need to be adjusted.  I also counseled her on maintaining adequate hydration.  She will also monitor her blood pressure more often and communicate with primary care team if blood pressures are consistently in the 100s for medication adjustments.   Type 2 diabetes mellitus with other specified complication, without long-term current use of insulin (HCC) Assessment & Plan: Diabetes well-controlled with a recent A1c of 6.0. Currently on semaglutide for diabetes management, which also aids in weight loss.  No signs or symptoms of hypoglycemia.  Concerns about long-term use and cost implications with upcoming insurance changes.  - Continue semaglutide for diabetes management.   Orders: -     Semaglutide (1 MG/DOSE); Inject 1 mg as directed once a week.  Dispense: 9 mL; Refill: 0  OSA on CPAP Assessment & Plan: On CPAP with reported good compliance. Continue PAP therapy. Losing 15% or more of body weight may improve AHI.      Class 2 severe obesity with serious comorbidity and body mass index (BMI) of 37.0 to 37.9 in adult, unspecified obesity type Laurie Mckinney) Assessment & Plan: Actively engaged in a weight management program with a 7-pound loss since the last visit. Follows a category three meal plan 90% of the time and exercises 5-7 days a week. Concerns about long-term use of  semaglutide (Ozempic) and potential weight regain after discontinuation. Explained fat mass set point and importance of maintaining weight loss to reset this set point. On paroxetine, which may contribute to weight gain; discussed switching to a weight-neutral antidepressant. Emphasized the need for adequate protein intake and hydration to support metabolism and prevent muscle loss. - Discuss with PCP about switching paroxetine to a weight-neutral antidepressant. - Continue semaglutide 1 mg once a week for weight management. - Ensure adequate protein intake of 30-40 grams per meal. - Maintain exercise routine of 220 minutes per week. - Monitor hydration with a goal of 90 ounces of water per day.   Vitamin D deficiency -     Vitamin D (Ergocalciferol); Take 1 capsule (50,000 Units total) by mouth every 14 (fourteen) days.  Dispense: 8 capsule; Refill: 0     Follow-up Needs to follow up with PCP regarding medication adjustments and potential changes in treatment plan. Ongoing support required for weight management. - Schedule follow-up appointment with PCP to discuss medication adjustments. - Ensure follow-up with Katie for ongoing weight management support.       Objective   Physical Exam:  Blood pressure 100/64, pulse 64, temperature 98.2 F (36.8 C), height 5\' 11"  (1.803 m), weight 206 lb (93.4 kg), last menstrual period 01/26/2011, SpO2 98%. Body mass index is 28.73 kg/m.  General: She is overweight, cooperative, alert,  well developed, and in no acute distress. PSYCH: Has normal mood, affect and thought process.   HEENT: EOMI, sclerae are anicteric. Lungs: Normal breathing effort, no conversational dyspnea. Extremities: No edema.  Neurologic: No gross sensory or motor deficits. No tremors or fasciculations noted.    Diagnostic Data Reviewed:  BMET    Component Value Date/Time   NA 141 03/23/2023 1159   K 4.3 03/23/2023 1159   CL 104 03/23/2023 1159   CO2 25 03/23/2023  1159   GLUCOSE 87 03/23/2023 1159   GLUCOSE 104 (H) 11/16/2022 1222   BUN 13 03/23/2023 1159   CREATININE 0.73 03/23/2023 1159   CREATININE 0.74 10/11/2019 0945   CALCIUM 9.6 03/23/2023 1159   GFRNONAA >60 11/16/2022 1222   GFRAA 95 11/14/2020 0944   Lab Results  Component Value Date   HGBA1C 6.0 (H) 11/04/2023   HGBA1C 6.2 (H) 01/21/2017   Lab Results  Component Value Date   INSULIN 12.6 11/30/2022   INSULIN 4.0 11/14/2020   Lab Results  Component Value Date   TSH 0.724 11/14/2020   CBC    Component Value Date/Time   WBC 5.9 03/23/2023 1159   WBC 6.4 10/11/2019 0945   RBC 4.57 03/23/2023 1159   RBC 4.92 10/11/2019 0945   HGB 13.8 03/23/2023 1159   HCT 41.7 03/23/2023 1159   PLT 249 03/23/2023 1159   MCV 91 03/23/2023 1159   MCH 30.2 03/23/2023 1159   MCH 30.3 10/11/2019 0945   MCHC 33.1 03/23/2023 1159   MCHC 33.5 10/11/2019 0945   RDW 12.3 03/23/2023 1159   Iron Studies No results found for: "IRON", "TIBC", "FERRITIN", "IRONPCTSAT" Lipid Panel     Component Value Date/Time   CHOL 121 03/23/2023 1159   TRIG 38 03/23/2023 1159   HDL 75 03/23/2023 1159   CHOLHDL 1.6 03/23/2023 1159   CHOLHDL 2.5 01/21/2017 0921   VLDL 11 01/21/2017 0921   LDLCALC 36 03/23/2023 1159   Hepatic Function Panel     Component Value Date/Time   PROT 7.1 03/23/2023 1159   ALBUMIN 4.3 03/23/2023 1159   AST 21 03/23/2023 1159   ALT 18 03/23/2023 1159   ALKPHOS 87 03/23/2023 1159   BILITOT 0.5 03/23/2023 1159      Component Value Date/Time   TSH 0.724 11/14/2020 0944   Nutritional Lab Results  Component Value Date   VD25OH 50.9 11/04/2023   VD25OH 68.8 03/23/2023   VD25OH 29.8 (L) 11/30/2022    Medications: Outpatient Encounter Medications as of 12/21/2023  Medication Sig   acetaminophen (TYLENOL) 650 MG CR tablet Take 650 mg by mouth every 8 (eight) hours as needed for pain.   atorvastatin (LIPITOR) 10 MG tablet TAKE 1 TABLET BY MOUTH EVERY DAY   buPROPion  (WELLBUTRIN SR) 200 MG 12 hr tablet TAKE 1 TABLET BY MOUTH EVERY DAY   GEMTESA 75 MG TABS Take 1 tablet by mouth daily.   Lancets (ONETOUCH DELICA PLUS LANCET33G) MISC USE AND DISCARD 1 LANCET   DAILY   lisinopril-hydrochlorothiazide (ZESTORETIC) 20-25 MG tablet Take 1 tablet by mouth daily.   Multiple Vitamins-Minerals (CENTRUM SILVER 50+WOMEN) TABS Take 1 tablet by mouth daily.   NONFORMULARY OR COMPOUNDED ITEM Amantadine 8%, Baclofen 2%, Gabapentin 6%, Amitriptyline 4%, Bupivacaine 2%, Clonidine 0.2%.   Apply 1-2 grams to the affected area 3-4 times daily   ONETOUCH VERIO test strip USE AND DISCARD 1 TEST     STRIP AS NEEDED AS         INSTRUCTED  oxybutynin (DITROPAN-XL) 10 MG 24 hr tablet TAKE 1 TABLET AT BEDTIME   PARoxetine (PAXIL) 20 MG tablet TAKE 1 TABLET BY MOUTH EVERY DAY   rivaroxaban (XARELTO) 20 MG TABS tablet TAKE 1 TABLET BY MOUTH DAILY WITH SUPPER   [DISCONTINUED] Semaglutide, 1 MG/DOSE, 4 MG/3ML SOPN Inject 1 mg as directed once a week.   [DISCONTINUED] Vitamin D, Ergocalciferol, (DRISDOL) 1.25 MG (50000 UNIT) CAPS capsule Take 1 capsule (50,000 Units total) by mouth every 14 (fourteen) days.   Semaglutide, 1 MG/DOSE, 4 MG/3ML SOPN Inject 1 mg as directed once a week.   Vitamin D, Ergocalciferol, (DRISDOL) 1.25 MG (50000 UNIT) CAPS capsule Take 1 capsule (50,000 Units total) by mouth every 14 (fourteen) days.   No facility-administered encounter medications on file as of 12/21/2023.     Follow-Up   Return in about 4 weeks (around 01/18/2024) for [facility].. She was informed of the importance of frequent follow up visits to maximize her success with intensive lifestyle modifications for her multiple health conditions.  Attestation Statement   Reviewed by clinician on day of visit: allergies, medications, problem list, medical history, surgical history, family history, social history, and previous encounter notes.     Worthy Rancher, MD

## 2023-12-21 NOTE — Assessment & Plan Note (Signed)
 Her blood pressure is low normal.  She is currently on lisinopril hydrochlorothiazide.  She notes some orthostatic dizziness particularly when gardening.  This may be related to her blood pressure.  She has lost a significant amount of weight and her blood pressure medications may need to be adjusted.  I also counseled her on maintaining adequate hydration.  She will also monitor her blood pressure more often and communicate with primary care team if blood pressures are consistently in the 100s for medication adjustments.

## 2023-12-21 NOTE — Assessment & Plan Note (Signed)
 Actively engaged in a weight management program with a 7-pound loss since the last visit. Follows a category three meal plan 90% of the time and exercises 5-7 days a week. Concerns about long-term use of semaglutide (Ozempic) and potential weight regain after discontinuation. Explained fat mass set point and importance of maintaining weight loss to reset this set point. On paroxetine, which may contribute to weight gain; discussed switching to a weight-neutral antidepressant. Emphasized the need for adequate protein intake and hydration to support metabolism and prevent muscle loss. - Discuss with PCP about switching paroxetine to a weight-neutral antidepressant. - Continue semaglutide 1 mg once a week for weight management. - Ensure adequate protein intake of 30-40 grams per meal. - Maintain exercise routine of 220 minutes per week. - Monitor hydration with a goal of 90 ounces of water per day.

## 2023-12-21 NOTE — Assessment & Plan Note (Signed)
 On CPAP with reported good compliance. Continue PAP therapy. Losing 15% or more of body weight may improve AHI.

## 2023-12-30 DIAGNOSIS — E119 Type 2 diabetes mellitus without complications: Secondary | ICD-10-CM | POA: Diagnosis not present

## 2023-12-30 DIAGNOSIS — I10 Essential (primary) hypertension: Secondary | ICD-10-CM | POA: Diagnosis not present

## 2023-12-30 DIAGNOSIS — C541 Malignant neoplasm of endometrium: Secondary | ICD-10-CM | POA: Diagnosis not present

## 2023-12-30 DIAGNOSIS — E785 Hyperlipidemia, unspecified: Secondary | ICD-10-CM | POA: Diagnosis not present

## 2023-12-30 DIAGNOSIS — Z79899 Other long term (current) drug therapy: Secondary | ICD-10-CM | POA: Diagnosis not present

## 2023-12-30 DIAGNOSIS — M21622 Bunionette of left foot: Secondary | ICD-10-CM | POA: Diagnosis not present

## 2023-12-30 DIAGNOSIS — M21621 Bunionette of right foot: Secondary | ICD-10-CM | POA: Diagnosis not present

## 2023-12-30 DIAGNOSIS — I4891 Unspecified atrial fibrillation: Secondary | ICD-10-CM | POA: Diagnosis not present

## 2023-12-30 DIAGNOSIS — M51369 Other intervertebral disc degeneration, lumbar region without mention of lumbar back pain or lower extremity pain: Secondary | ICD-10-CM | POA: Diagnosis not present

## 2023-12-30 DIAGNOSIS — G4733 Obstructive sleep apnea (adult) (pediatric): Secondary | ICD-10-CM | POA: Diagnosis not present

## 2024-01-20 DIAGNOSIS — H903 Sensorineural hearing loss, bilateral: Secondary | ICD-10-CM | POA: Diagnosis not present

## 2024-02-03 ENCOUNTER — Ambulatory Visit (INDEPENDENT_AMBULATORY_CARE_PROVIDER_SITE_OTHER): Admitting: Adult Health

## 2024-02-03 ENCOUNTER — Encounter (INDEPENDENT_AMBULATORY_CARE_PROVIDER_SITE_OTHER): Payer: Self-pay | Admitting: Adult Health

## 2024-02-03 VITALS — BP 104/54 | HR 60 | Temp 98.4°F | Ht 71.0 in | Wt 213.0 lb

## 2024-02-03 DIAGNOSIS — R001 Bradycardia, unspecified: Secondary | ICD-10-CM

## 2024-02-03 DIAGNOSIS — Z6829 Body mass index (BMI) 29.0-29.9, adult: Secondary | ICD-10-CM

## 2024-02-03 DIAGNOSIS — I152 Hypertension secondary to endocrine disorders: Secondary | ICD-10-CM | POA: Diagnosis not present

## 2024-02-03 DIAGNOSIS — Z7985 Long-term (current) use of injectable non-insulin antidiabetic drugs: Secondary | ICD-10-CM

## 2024-02-03 DIAGNOSIS — E1169 Type 2 diabetes mellitus with other specified complication: Secondary | ICD-10-CM

## 2024-02-03 DIAGNOSIS — Z6837 Body mass index (BMI) 37.0-37.9, adult: Secondary | ICD-10-CM

## 2024-02-03 DIAGNOSIS — E1159 Type 2 diabetes mellitus with other circulatory complications: Secondary | ICD-10-CM | POA: Diagnosis not present

## 2024-02-03 DIAGNOSIS — E669 Obesity, unspecified: Secondary | ICD-10-CM

## 2024-02-03 NOTE — Progress Notes (Signed)
 WEIGHT SUMMARY AND BIOMETRICS  Vitals Temp: 98.4 F (36.9 C) BP: (!) 104/54 Pulse Rate: 60 SpO2: 98 %   Anthropometric Measurements Height: 5\' 11"  (1.803 m) Weight: 213 lb (96.6 kg) BMI (Calculated): 29.72 Weight at Last Visit: 206 lb Weight Lost Since Last Visit: 0 Weight Gained Since Last Visit: 7 lb Starting Weight: 268 lb Total Weight Loss (lbs): 55 lb (24.9 kg) Peak Weight: 286 lb   Body Composition  Body Fat %: 40.7 % Fat Mass (lbs): 87 lbs Muscle Mass (lbs): 120.2 lbs Total Body Water  (lbs): 90.6 lbs Visceral Fat Rating : 11   Other Clinical Data Fasting: no Labs: no Today's Visit #: 39 Starting Date: 11/14/20    Chief Complaint:   OBESITY Laurie Mckinney is here to discuss her progress with her obesity treatment plan.  She is on the the Category 3 Plan and states she is following her eating plan approximately 25 % of the time.  She states she is exercising: NONE, s/p Tailor's bunion of both feet (Primary Dx)   Interim History:  Laurie Mckinney underwent successful Tailor's bunion of both feet (Primary Dx) on 12/30/2023 with Dr. Abbey Abbe She has been slowly increasing activity/walking She has now out of bilateral post op shoes- she is wearing flip flops at today's OV  Laurie Mckinney reports recent increased fatigue and dizziness with position change the last month or so.  Subjective:   1. Type 2 diabetes mellitus with other specified complication, without long-term current use of insulin  (HCC) Lab Results  Component Value Date   HGBA1C 6.0 (H) 11/04/2023   HGBA1C 5.8 (A) 03/23/2023   HGBA1C 5.8 (H) 10/29/2022    She is not checking home CBG. She denies any recent episodes of hypoglycemia. She is on weekly Ozempic  1mg  Denies mass in neck, dysphagia, dyspepsia, persistent hoarseness, abdominal pain, or N/V/C   2. Hypertension associated with diabetes (HCC) EPIC review BP has been trending down She endorses fatigue and dizziness with position change. She  is on  lisinopril -hydrochlorothiazide  (ZESTORETIC ) 20-25 MG tablet  atorvastatin  (LIPITOR) 10 MG tablet  rivaroxaban  (XARELTO ) 20 MG TABS tablet  Semaglutide , 1 MG/DOSE, 4 MG/3ML SOPN   PCP manages Zestoretic  therapy  3. Heart rate slow EPIC review HR has been trending down She endorses fatigue and dizziness with position change. She is on  lisinopril -hydrochlorothiazide  (ZESTORETIC ) 20-25 MG tablet  atorvastatin  (LIPITOR) 10 MG tablet  rivaroxaban  (XARELTO ) 20 MG TABS tablet  Semaglutide , 1 MG/DOSE, 4 MG/3ML SOPN   Cards manages A fib  Assessment/Plan:   1. Type 2 diabetes mellitus with other specified complication, without long-term current use of insulin  (HCC) Continue prescribed meal plan, increase activity as tolerated, and weekly Ozempic - she does not require refill at this time  2. Hypertension associated with diabetes (HCC) F/u with PCP, re: BP trending down with sx's of hypotension  3. Heart rate slow F/u with established Cardiologist, re: HR trending down. Family hx of PPM- her father  19. Obesity (BMI 30-39.9), Current BMI 29.8  Laurie Mckinney is currently in the action stage of change. As such, her goal is to continue with weight loss efforts. She has agreed to the Category 3 Plan.   Exercise goals: All adults should avoid inactivity. Some physical activity is better than none, and adults who participate in any amount of physical activity gain some health benefits. Adults should also include muscle-strengthening activities that involve all major muscle groups on 2 or more days a week.  Behavioral modification strategies: increasing  lean protein intake, decreasing simple carbohydrates, increasing vegetables, increasing water  intake, decreasing eating out, no skipping meals, meal planning and cooking strategies, keeping healthy foods in the home, and planning for success.  Laurie Mckinney has agreed to follow-up with our clinic in 4 weeks. She was informed of the importance of  frequent follow-up visits to maximize her success with intensive lifestyle modifications for her multiple health conditions.   F/u with PCP and Cards, re: Hypotension/Low HR with sx's  Objective:   Blood pressure (!) 104/54, pulse 60, temperature 98.4 F (36.9 C), height 5\' 11"  (1.803 m), weight 213 lb (96.6 kg), last menstrual period 01/26/2011, SpO2 98%. Body mass index is 29.71 kg/m.  General: Cooperative, alert, well developed, in no acute distress. HEENT: Conjunctivae and lids unremarkable. Cardiovascular: Regular rhythm.  Lungs: Normal work of breathing. Neurologic: No focal deficits.   Lab Results  Component Value Date   CREATININE 0.73 03/23/2023   BUN 13 03/23/2023   NA 141 03/23/2023   K 4.3 03/23/2023   CL 104 03/23/2023   CO2 25 03/23/2023   Lab Results  Component Value Date   ALT 18 03/23/2023   AST 21 03/23/2023   ALKPHOS 87 03/23/2023   BILITOT 0.5 03/23/2023   Lab Results  Component Value Date   HGBA1C 6.0 (H) 11/04/2023   HGBA1C 5.8 (A) 03/23/2023   HGBA1C 5.8 (H) 10/29/2022   HGBA1C 5.9 (H) 08/03/2022   HGBA1C 5.5 03/09/2022   Lab Results  Component Value Date   INSULIN  12.6 11/30/2022   INSULIN  9.7 05/13/2021   INSULIN  4.0 11/14/2020   Lab Results  Component Value Date   TSH 0.724 11/14/2020   Lab Results  Component Value Date   CHOL 121 03/23/2023   HDL 75 03/23/2023   LDLCALC 36 03/23/2023   TRIG 38 03/23/2023   CHOLHDL 1.6 03/23/2023   Lab Results  Component Value Date   VD25OH 50.9 11/04/2023   VD25OH 68.8 03/23/2023   VD25OH 29.8 (L) 11/30/2022   Lab Results  Component Value Date   WBC 5.9 03/23/2023   HGB 13.8 03/23/2023   HCT 41.7 03/23/2023   MCV 91 03/23/2023   PLT 249 03/23/2023   No results found for: "IRON", "TIBC", "FERRITIN"  Attestation Statements:   Reviewed by clinician on day of visit: allergies, medications, problem list, medical history, surgical history, family history, social history, and previous  encounter notes.  I have reviewed the above documentation for accuracy and completeness, and I agree with the above. -  Jomarie Gellis d. Yue Flanigan, NP-C

## 2024-02-04 DIAGNOSIS — H43393 Other vitreous opacities, bilateral: Secondary | ICD-10-CM | POA: Diagnosis not present

## 2024-02-16 ENCOUNTER — Other Ambulatory Visit: Payer: Self-pay | Admitting: Family Medicine

## 2024-02-16 DIAGNOSIS — E1159 Type 2 diabetes mellitus with other circulatory complications: Secondary | ICD-10-CM

## 2024-02-17 DIAGNOSIS — R350 Frequency of micturition: Secondary | ICD-10-CM | POA: Diagnosis not present

## 2024-02-17 DIAGNOSIS — N3946 Mixed incontinence: Secondary | ICD-10-CM | POA: Diagnosis not present

## 2024-03-11 ENCOUNTER — Other Ambulatory Visit: Payer: Self-pay | Admitting: Family Medicine

## 2024-03-11 DIAGNOSIS — F341 Dysthymic disorder: Secondary | ICD-10-CM

## 2024-03-15 ENCOUNTER — Ambulatory Visit (INDEPENDENT_AMBULATORY_CARE_PROVIDER_SITE_OTHER): Admitting: Adult Health

## 2024-03-16 ENCOUNTER — Encounter: Payer: Self-pay | Admitting: Cardiology

## 2024-03-20 ENCOUNTER — Ambulatory Visit (INDEPENDENT_AMBULATORY_CARE_PROVIDER_SITE_OTHER): Admitting: Adult Health

## 2024-03-21 ENCOUNTER — Other Ambulatory Visit: Payer: Self-pay | Admitting: Family Medicine

## 2024-03-21 DIAGNOSIS — F341 Dysthymic disorder: Secondary | ICD-10-CM

## 2024-03-30 ENCOUNTER — Ambulatory Visit (INDEPENDENT_AMBULATORY_CARE_PROVIDER_SITE_OTHER): Admitting: Family Medicine

## 2024-03-30 ENCOUNTER — Encounter (INDEPENDENT_AMBULATORY_CARE_PROVIDER_SITE_OTHER): Payer: Self-pay | Admitting: Family Medicine

## 2024-03-30 VITALS — BP 125/74 | HR 65 | Temp 98.4°F | Ht 71.0 in | Wt 213.0 lb

## 2024-03-30 DIAGNOSIS — E669 Obesity, unspecified: Secondary | ICD-10-CM | POA: Diagnosis not present

## 2024-03-30 DIAGNOSIS — E559 Vitamin D deficiency, unspecified: Secondary | ICD-10-CM

## 2024-03-30 DIAGNOSIS — Z7985 Long-term (current) use of injectable non-insulin antidiabetic drugs: Secondary | ICD-10-CM

## 2024-03-30 DIAGNOSIS — E1169 Type 2 diabetes mellitus with other specified complication: Secondary | ICD-10-CM | POA: Diagnosis not present

## 2024-03-30 DIAGNOSIS — Z6829 Body mass index (BMI) 29.0-29.9, adult: Secondary | ICD-10-CM | POA: Diagnosis not present

## 2024-03-30 MED ORDER — SEMAGLUTIDE (1 MG/DOSE) 4 MG/3ML ~~LOC~~ SOPN
1.0000 mg | PEN_INJECTOR | SUBCUTANEOUS | 0 refills | Status: DC
Start: 2024-03-30 — End: 2024-05-04

## 2024-03-30 NOTE — Progress Notes (Signed)
 Laurie Mckinney, D.O.  ABFM, ABOM Specializing in Clinical Bariatric Medicine  Office located at: [address] [place], [place]  [address]   Assessment and Plan:  No orders of the defined types were placed in this encounter.  Medications Discontinued During This Encounter  Medication Reason   Semaglutide , 1 MG/DOSE, 4 MG/3ML SOPN Reorder    Meds ordered this encounter  Medications   Semaglutide , 1 MG/DOSE, 4 MG/3ML SOPN    Sig: Inject 1 mg as directed once a week.    Dispense:  9 mL    Refill:  0     Labs not obtained today given pt will be having they drawn by PCP in about 3 days for her routine physical exam.    FOR THE DISEASE OF OBESITY: Type 2 diabetes mellitus with other specified complication, without long-term current use of insulin  (HCC) Obesity, Starting BMI 37.38 BMI 29.0-29.9,adult current 29.71 Assessment & Plan: Since last office visit with [name] on 02/03/24 patient's muscle mass has increased by 0.4 lb. Fat mass has decreased by 1 lb. Total body water  has decreased by 2.2 lb.  Counseling done on how various foods will affect these numbers and how to maximize success  Total lbs lost to date: 55 lbs Total weight loss percentage to date: -20.52%    Recommended Dietary Goals Laurie Mckinney is currently in the action stage of change. As such, her goal is to continue weight management plan.  She has agreed to: continue current plan and  Additionally encouraged pt to consider journaling and adhering to recommended goal of 1500-1600 cal and 90+ g of protein per day.    Behavioral Intervention We discussed the following today: increasing lean protein intake to established goals, decreasing simple carbohydrates , avoiding skipping meals, work on tracking and journaling calories using tracking application, and focusing on food with a 10:1 ratio of calories: grams of protein  Additional resources provided today: Handout on Daily Food Journaling Log and  Handout on Common Characteristics of Successful Weight Losers,  Evidence-based interventions for health behavior change were utilized today including the discussion of self monitoring techniques, problem-solving barriers and SMART goal setting techniques.   Regarding patient's less desirable eating habits and patterns, we employed the technique of small changes.   Pt will specifically work on: consider journaling (parameters above) for next visit.    Recommended Physical Activity Goals Laurie Mckinney has been advised to work up to 300-450 minutes of moderate intensity aerobic activity a week and strengthening exercises 2-3 times per week for cardiovascular health, weight loss maintenance and preservation of muscle mass.   She has agreed to: Continue current level of physical activity    Pharmacotherapy We both agreed to: Continue with current nutritional and behavioral strategies and continue with current med regimen.    ASSOCIATED CONDITIONS ADDRESSED TODAY:  Type 2 diabetes mellitus with other specified complication, without long-term current use of insulin  [facility]) Assessment & Plan: Lab Results  Component Value Date   HGBA1C 6.0 (H) 11/04/2023   HGBA1C 5.8 (A) 03/23/2023   HGBA1C 5.8 (H) 10/29/2022   INSULIN  12.6 11/30/2022   INSULIN  9.7 05/13/2021   INSULIN  4.0 11/14/2020    Compliant with Semaglutide  1 mg weekly. SE include : constipation, belching, and heartburn. A1c was 6.0 as of 11/04/23; increased since 1 year ago. Reports decreased clinical benefits, which she attributes to being on this dose for a while.  She does not  seem to have effect with hunger/cravings. Reviewed the  importance of eating on plan to any GI upset or other GI symptoms. Continue with current med regimen; will refill Semaglutide  1 mg once weekly. Work on following her meal plan in an effort to avoid potential side effects and GI upset. Pt verbalized understanding/agreement to this plan.     Vitamin D   deficiency Assessment & Plan: Lab Results  Component Value Date   VD25OH 50.9 11/04/2023   VD25OH 68.8 03/23/2023   VD25OH 29.8 (L) 11/30/2022   Compliant with ERGO 50 K units every 14 days. Tolerating well, no adverse side effects reported. Her loss obtained, vitamin D  is at goal at 50.9 about 5 months ago. Pt declines need for refill today. PCP does not need to obtain vitamin D  labs, we will obtain it at her next OV.  Reviewed ideal vitamin D  goal of 50-70 with patient. Continue with current supplementation, as prescribed. Refill not required.  No changes made today.     Follow up:   Return in about 8 weeks (around 05/25/2024) for for 4-7 week f/u with [name]. She was informed of the importance of frequent follow up visits to maximize her success with intensive lifestyle modifications for her multiple health conditions.  Subjective:   Chief complaint: Obesity Laurie Mckinney is here to discuss her progress with her obesity treatment plan. She is on the Category 3 Plan and states she is following her eating plan approximately 10% of the time. She states she is walking 30 minutes 5-7 days per week   Interval History:  Laurie Mckinney is here for a follow up office visit. She is established with [name]. Since last OV with Laurie Mckinney on 02/03/24, She has had a decreased desire to follow her meal plan, however, she is still interested in improving her overall health. She recently put her mother in assisted living for cognitive decline, which has been tough on her. She also reports having to clean her mother's appt. Her mother was previously living independently with hired help. For dinner she typically eats either grilled chicken or fish. She  Pharmacotherapy for weight loss: She is currently taking Wellbutrin  SR 200 mg daily and Semaglutide  1 mg once weekly  Review of Systems:  Pertinent positives were addressed with patient today.  Reviewed by clinician on day of visit: allergies,  medications, problem list, medical history, surgical history, family history, social history, and previous encounter notes.  Weight Summary and Biometrics   Weight Lost Since Last Visit: 0lb  Weight Gained Since Last Visit: 0lb    Vitals Temp: 98.4 F (36.9 C) BP: 125/74 Pulse Rate: 65 SpO2: 98 %   Anthropometric Measurements Height: 5' 11 (1.803 m) Weight: 213 lb (96.6 kg) BMI (Calculated): 29.72 Weight at Last Visit: 213lb Weight Lost Since Last Visit: 0lb Weight Gained Since Last Visit: 0lb Starting Weight: 268lb Total Weight Loss (lbs): 55 lb (24.9 kg) Peak Weight: 286lb   Body Composition  Body Fat %: 40.4 % Fat Mass (lbs): 86 lbs Muscle Mass (lbs): 120.6 lbs Total Body Water  (lbs): 88.4 lbs Visceral Fat Rating : 11   Other Clinical Data Fasting: No Labs: No Today's Visit #: 40 Starting Date: 11/14/20    Objective:   PHYSICAL EXAM: Blood pressure 125/74, pulse 65, temperature 98.4 F (36.9 C), height 5' 11 (1.803 m), weight 213 lb (96.6 kg), last menstrual period 01/26/2011, SpO2 98%. Body mass index is 29.71 kg/m.  General: she is overweight, cooperative and in no acute distress. PSYCH: Has normal mood, affect  and thought process.   HEENT: EOMI, sclerae are anicteric. Lungs: Normal breathing effort, no conversational dyspnea. Extremities: Moves * 4 Neurologic: A and O * 3, good insight  DIAGNOSTIC DATA REVIEWED: BMET    Component Value Date/Time   NA 141 03/23/2023 1159   K 4.3 03/23/2023 1159   CL 104 03/23/2023 1159   CO2 25 03/23/2023 1159   GLUCOSE 87 03/23/2023 1159   GLUCOSE 104 (H) 11/16/2022 1222   BUN 13 03/23/2023 1159   CREATININE 0.73 03/23/2023 1159   CREATININE 0.74 10/11/2019 0945   CALCIUM  9.6 03/23/2023 1159   GFRNONAA >60 11/16/2022 1222   GFRAA 95 11/14/2020 0944   Lab Results  Component Value Date   HGBA1C 6.0 (H) 11/04/2023   HGBA1C 6.2 (H) 01/21/2017   Lab Results  Component Value Date   INSULIN  12.6  11/30/2022   INSULIN  4.0 11/14/2020   Lab Results  Component Value Date   TSH 0.724 11/14/2020   CBC    Component Value Date/Time   WBC 5.9 03/23/2023 1159   WBC 6.4 10/11/2019 0945   RBC 4.57 03/23/2023 1159   RBC 4.92 10/11/2019 0945   HGB 13.8 03/23/2023 1159   HCT 41.7 03/23/2023 1159   PLT 249 03/23/2023 1159   MCV 91 03/23/2023 1159   MCH 30.2 03/23/2023 1159   MCH 30.3 10/11/2019 0945   MCHC 33.1 03/23/2023 1159   MCHC 33.5 10/11/2019 0945   RDW 12.3 03/23/2023 1159   Iron Studies No results found for: IRON, TIBC, FERRITIN, IRONPCTSAT Lipid Panel     Component Value Date/Time   CHOL 121 03/23/2023 1159   TRIG 38 03/23/2023 1159   HDL 75 03/23/2023 1159   CHOLHDL 1.6 03/23/2023 1159   CHOLHDL 2.5 01/21/2017 0921   VLDL 11 01/21/2017 0921   LDLCALC 36 03/23/2023 1159   Hepatic Function Panel     Component Value Date/Time   PROT 7.1 03/23/2023 1159   ALBUMIN 4.3 03/23/2023 1159   AST 21 03/23/2023 1159   ALT 18 03/23/2023 1159   ALKPHOS 87 03/23/2023 1159   BILITOT 0.5 03/23/2023 1159      Component Value Date/Time   TSH 0.724 11/14/2020 0944   Nutritional Lab Results  Component Value Date   VD25OH 50.9 11/04/2023   VD25OH 68.8 03/23/2023   VD25OH 29.8 (L) 11/30/2022    Attestations:   LILLETTE Vernell Forest, acting as a medical scribe for Laurie Jenkins, DO., have compiled all relevant documentation for today's office visit on behalf of Laurie Jenkins, DO, while in the presence of [employer], DO.  I have reviewed the above documentation for accuracy and completeness, and I agree with the above. Laurie JINNY Mckinney, D.O.  The 21st Century Cures Act was signed into law in 2016 which includes the topic of electronic health records.  This provides immediate access to information in MyChart.  This includes consultation notes, operative notes, office notes, lab results and pathology reports.  If you have any questions about what you read please  let us  know at your next visit so we can discuss your concerns and take corrective action if need be.  We are right here with you.

## 2024-04-02 DIAGNOSIS — Z860101 Personal history of adenomatous and serrated colon polyps: Secondary | ICD-10-CM | POA: Insufficient documentation

## 2024-04-04 ENCOUNTER — Ambulatory Visit (INDEPENDENT_AMBULATORY_CARE_PROVIDER_SITE_OTHER): Payer: BC Managed Care – PPO | Admitting: Family Medicine

## 2024-04-04 ENCOUNTER — Encounter: Payer: Self-pay | Admitting: Family Medicine

## 2024-04-04 VITALS — BP 124/86 | HR 69 | Ht 70.0 in | Wt 215.0 lb

## 2024-04-04 DIAGNOSIS — E1140 Type 2 diabetes mellitus with diabetic neuropathy, unspecified: Secondary | ICD-10-CM | POA: Diagnosis not present

## 2024-04-04 DIAGNOSIS — M545 Low back pain, unspecified: Secondary | ICD-10-CM | POA: Diagnosis not present

## 2024-04-04 DIAGNOSIS — E1169 Type 2 diabetes mellitus with other specified complication: Secondary | ICD-10-CM | POA: Diagnosis not present

## 2024-04-04 DIAGNOSIS — D6869 Other thrombophilia: Secondary | ICD-10-CM

## 2024-04-04 DIAGNOSIS — E1159 Type 2 diabetes mellitus with other circulatory complications: Secondary | ICD-10-CM | POA: Diagnosis not present

## 2024-04-04 DIAGNOSIS — H35033 Hypertensive retinopathy, bilateral: Secondary | ICD-10-CM

## 2024-04-04 DIAGNOSIS — I482 Chronic atrial fibrillation, unspecified: Secondary | ICD-10-CM | POA: Diagnosis not present

## 2024-04-04 DIAGNOSIS — I152 Hypertension secondary to endocrine disorders: Secondary | ICD-10-CM

## 2024-04-04 DIAGNOSIS — G4733 Obstructive sleep apnea (adult) (pediatric): Secondary | ICD-10-CM

## 2024-04-04 DIAGNOSIS — Z9889 Other specified postprocedural states: Secondary | ICD-10-CM

## 2024-04-04 DIAGNOSIS — Z860101 Personal history of adenomatous and serrated colon polyps: Secondary | ICD-10-CM

## 2024-04-04 DIAGNOSIS — E2839 Other primary ovarian failure: Secondary | ICD-10-CM

## 2024-04-04 DIAGNOSIS — I441 Atrioventricular block, second degree: Secondary | ICD-10-CM | POA: Diagnosis not present

## 2024-04-04 DIAGNOSIS — E785 Hyperlipidemia, unspecified: Secondary | ICD-10-CM

## 2024-04-04 DIAGNOSIS — Z0000 Encounter for general adult medical examination without abnormal findings: Secondary | ICD-10-CM | POA: Diagnosis not present

## 2024-04-04 DIAGNOSIS — E669 Obesity, unspecified: Secondary | ICD-10-CM | POA: Diagnosis not present

## 2024-04-04 DIAGNOSIS — Z8542 Personal history of malignant neoplasm of other parts of uterus: Secondary | ICD-10-CM

## 2024-04-04 DIAGNOSIS — E0841 Diabetes mellitus due to underlying condition with diabetic mononeuropathy: Secondary | ICD-10-CM

## 2024-04-04 DIAGNOSIS — J301 Allergic rhinitis due to pollen: Secondary | ICD-10-CM

## 2024-04-04 DIAGNOSIS — F341 Dysthymic disorder: Secondary | ICD-10-CM

## 2024-04-04 DIAGNOSIS — Z8669 Personal history of other diseases of the nervous system and sense organs: Secondary | ICD-10-CM

## 2024-04-04 DIAGNOSIS — Z90710 Acquired absence of both cervix and uterus: Secondary | ICD-10-CM

## 2024-04-04 DIAGNOSIS — N393 Stress incontinence (female) (male): Secondary | ICD-10-CM

## 2024-04-04 DIAGNOSIS — H900 Conductive hearing loss, bilateral: Secondary | ICD-10-CM

## 2024-04-04 LAB — POCT GLYCOSYLATED HEMOGLOBIN (HGB A1C): Hemoglobin A1C: 6.2 % — AB (ref 4.0–5.6)

## 2024-04-04 LAB — POCT UA - MICROALBUMIN
Albumin/Creatinine Ratio, Urine, POC: 5
Creatinine, POC: 67.2 mg/dL
Microalbumin Ur, POC: 7.4 mg/L

## 2024-04-04 LAB — LIPID PANEL

## 2024-04-04 MED ORDER — PAROXETINE HCL 20 MG PO TABS: 20.0000 mg | ORAL_TABLET | Freq: Every day | ORAL | 0 refills | Status: DC

## 2024-04-04 MED ORDER — BUPROPION HCL ER (SR) 200 MG PO TB12: 200.0000 mg | ORAL_TABLET | Freq: Every day | ORAL | 0 refills | Status: DC

## 2024-04-04 MED ORDER — ATORVASTATIN CALCIUM 10 MG PO TABS: 10.0000 mg | ORAL_TABLET | Freq: Every day | ORAL | 1 refills | Status: DC

## 2024-04-04 MED ORDER — RIVAROXABAN 20 MG PO TABS: 20.0000 mg | ORAL_TABLET | Freq: Every day | ORAL | 3 refills | Status: AC

## 2024-04-04 MED ORDER — GEMTESA 75 MG PO TABS: 1.0000 | ORAL_TABLET | Freq: Every day | ORAL | 3 refills | Status: AC

## 2024-04-04 NOTE — Progress Notes (Signed)
 Laurie Mckinney is a 63 y.o. female who presents for CPE and follow-up on chronic medical conditions.  She continues to be followed by medical weight loss and wellness.  Her weight has been stable and have been working with her on increasing her physical activity as well as making some dietary changes.  She has had a recent eye exam.  She continues doing quite nicely on her present psychotropic medications and is not interested in stopping them.  She does have a history of atrial fibs and has had an ablation.  She continues to do well on her CPAP.  Apparently she did have a sleep study done after she had lost weight and still shows evidence of OSA.  She has not had any difficulty with her migraines.  She does have hearing aids but does not use them regularly.  Her allergies seem to be under good control.  She does have a history of stress incontinence and is using Gemtesa with good results.  She has had a breast reduction which she is happy that she has done.  She does have history of cataracts and does get follow-up with ophthalmology.  Presently she is not having any back pain.  She does have a history of adenomatous colonic polyp and will be scheduled for follow-up in about 5 years.  Immunizations and Health Maintenance Immunization History  Administered Date(s) Administered   Influenza Inj Mdck Quad Pf 06/19/2018, 06/28/2022   Influenza Split 06/05/2012, 05/24/2013, 06/15/2019   Influenza Whole 07/16/2008, 06/05/2009, 07/05/2010   Influenza, Seasonal, Injecte, Preservative Fre 07/18/2023   Influenza,inj,Quad PF,6+ Mos 06/07/2017, 06/15/2019, 06/20/2021   Influenza-Unspecified 06/05/2013, 06/19/2014, 06/21/2015, 05/15/2016, 06/07/2017, 06/19/2018, 06/15/2019, 08/14/2020   MMR 03/06/1969   Moderna SARS-COV2 Booster Vaccination 02/10/2021   Moderna Sars-Covid-2 Vaccination 08/14/2020, 02/10/2021   PFIZER(Purple Top)SARS-COV-2 Vaccination 12/28/2019, 01/20/2020   Pfizer(Comirnaty)Fall Seasonal  Vaccine 12 years and older 07/18/2023, 03/28/2024   Pneumococcal Conjugate-13 06/15/2019   Pneumococcal Polysaccharide-23 02/10/2021   Rsv, Bivalent, Protein Subunit Rsvpref,pf (Abrysvo) 07/18/2023   Td 03/04/2005   Tdap 08/24/2012, 09/04/2013   Unspecified SARS-COV-2 Vaccination 07/01/2022   Zoster Recombinant(Shingrix ) 05/05/2018, 07/19/2018   Health Maintenance Due  Topic Date Due   Medicare Annual Wellness (AWV)  Never done   FOOT EXAM  03/10/2023   DTaP/Tdap/Td (4 - Td or Tdap) 09/05/2023   OPHTHALMOLOGY EXAM  01/25/2024   MAMMOGRAM  03/13/2024   DEXA SCAN  Never done   Diabetic kidney evaluation - eGFR measurement  03/22/2024    Last Pap smear: within the last year June 2024 Last mammogram: June 2024 Last colonoscopy: 08/03/2023 Last DEXA:ordered Dentist: within the past 2 months  Ophtho: within the last year Exercise: walking dogs   Other doctors caring for patient include: [facility]  [facility].  Will Camnitz cardiologist  Velma Boehringer   Advanced directives:info given  Depression screen:  See questionnaire below.     04/04/2024    8:17 AM 03/23/2023   11:10 AM 03/09/2022    9:25 AM 12/18/2021    8:29 AM 02/10/2021    9:38 AM  Depression screen PHQ 2/9  Decreased Interest 0 0 0 0 0  Down, Depressed, Hopeless 0 0 0 0 0  PHQ - 2 Score 0 0 0 0 0  Altered sleeping  1 3    Tired, decreased energy  0 0    Change in appetite  0 0    Feeling bad or failure about yourself   0 0  Trouble concentrating  0 0    Moving slowly or fidgety/restless  0 0    Suicidal thoughts  0 0    PHQ-9 Score  1 3    Difficult doing work/chores   Not difficult at all      Fall Risk Screen: see questionnaire below.    04/04/2024    8:17 AM 02/10/2021    9:38 AM 05/02/2019    7:45 AM 07/27/2018   10:22 AM 01/21/2017    9:14 AM  Fall Risk   Falls in the past year? 0 0 1  No  No   Number falls in past yr: 0 0 1     Injury with Fall? 0 0 1    Risk  for fall due to : No Fall Risks No Fall Risks     Follow up Falls evaluation completed Falls evaluation completed         Data saved with a previous flowsheet row definition    ADL screen:  See questionnaire below Functional Status Survey: Is the patient deaf or have difficulty hearing?: Yes Does the patient have difficulty seeing, even when wearing glasses/contacts?: Yes Does the patient have difficulty concentrating, remembering, or making decisions?: Yes Does the patient have difficulty walking or climbing stairs?: No Does the patient have difficulty dressing or bathing?: No Does the patient have difficulty doing errands alone such as visiting a doctor's office or shopping?: No   Review of Systems Constitutional: -, -unexpected weight change, -anorexia, -fatigue Allergy: -sneezing, -itching, -congestion Dermatology: denies changing moles, rash, lumps ENT: -runny nose, -ear pain, -sore throat,  Cardiology:  -chest pain, -palpitations, -orthopnea, Respiratory: -cough, -shortness of breath, -dyspnea on exertion, -wheezing,  Gastroenterology: -abdominal pain, -nausea, -vomiting, -diarrhea, -constipation, -dysphagia Hematology: -bleeding or bruising problems Musculoskeletal: -arthralgias, -myalgias, -joint swelling, -back pain, - Ophthalmology: -vision changes,  Urology: -dysuria, -difficulty urinating,  -urinary frequency, -urgency, incontinence Neurology: -, -numbness, , -memory loss, -falls, -dizziness    PHYSICAL EXAM:  BP 124/86   Pulse 69   Ht 5' 10 (1.778 m)   Wt 215 lb (97.5 kg)   LMP 01/26/2011   SpO2 98%   BMI 30.85 kg/m   General Appearance: Alert, cooperative, no distress, appears stated age Head: Normocephalic, without obvious abnormality, atraumatic Eyes: PERRL, conjunctiva/corneas clear, EOM's intact,  Ears: Normal TM's and external ear canals Nose: Nares normal, mucosa normal, no drainage or sinus tenderness Throat: Lips, mucosa, and tongue normal; teeth  and gums normal Neck: Supple, no lymphadenopathy;  thyroid:  no enlargement/tenderness/nodules; no carotid bruit or JVD Lungs: Clear to auscultation bilaterally without wheezes, rales or ronchi; respirations unlabored Heart: Regular rate and rhythm, S1 and S2 normal, no murmur, rubor gallop Abdomen: Soft, non-tender, nondistended, normoactive bowel sounds,  no masses, no hepatosplenomegaly Extremities: No clubbing, cyanosis or edema Pulses: 2+ and symmetric all extremities Skin:  Skin color, texture, turgor normal, no rashes or lesions Lymph nodes: Cervical, supraclavicular, and axillary nodes normal Neurologic:  CNII-XII intact, normal strength, sensation and gait; reflexes 2+ and symmetric throughout Psych: Normal mood, affect, hygiene and grooming.  ASSESSMENT/PLAN: Routine general medical examination at a health care facility  Secondary hypercoagulable state [facility]) - Plan: rivaroxaban  (XARELTO ) 20 MG TABS tablet  Second degree heart block  Hypertensive retinopathy of both eyes - Plan: CBC with Differential/Platelet, Comprehensive metabolic panel with GFR  Obesity (BMI 30-39.9)  OSA on CPAP  Hypertension associated with diabetes (HCC) - Plan: CBC with Differential/Platelet, Comprehensive metabolic panel with GFR  Hyperlipidemia associated  with type 2 diabetes mellitus (HCC) - Plan: Lipid panel, atorvastatin  (LIPITOR) 10 MG tablet  History of migraine headaches  Conductive hearing loss, bilateral  Dysthymia - Plan: buPROPion  (WELLBUTRIN  SR) 200 MG 12 hr tablet, PARoxetine  (PAXIL ) 20 MG tablet  Chronic midline low back pain without sciatica  Chronic atrial fibrillation (HCC) - Plan: rivaroxaban  (XARELTO ) 20 MG TABS tablet  Seasonal allergic rhinitis due to pollen  History of endometrial cancer  Diabetic mononeuropathy associated with diabetes mellitus due to underlying condition (HCC)  Type 2 diabetes mellitus with diabetic neuropathy, without long-term current use of  insulin  (HCC) - Plan: CBC with Differential/Platelet, Comprehensive metabolic panel with GFR, Lipid panel, POCT glycosylated hemoglobin (Hb A1C), POCT UA - Microalbumin  Female stress incontinence - Plan: GEMTESA 75 MG TABS  H/O bilateral breast reduction surgery  History of hysterectomy for cancer  Hx of adenomatous colonic polyps  Estrogen deficiency - Plan: DG Bone Density     Immunization recommendations discussed.  Colonoscopy recommendations reviewed recommend that she discuss with cardiology the need to stay on Xarelto  since she had the ablation that seems to be working.  Otherwise continue on present medication regimen.   Medicare Attestation I have personally reviewed: The patient's medical and social history Their use of alcohol, tobacco or illicit drugs Their current medications and supplements The patient's functional ability including ADLs,fall risks, home safety risks, cognitive, and hearing and visual impairment Diet and physical activities Evidence for depression or mood disorders  The patient's weight, height, and BMI have been recorded in the chart.  I have made referrals, counseling, and provided education to the patient based on review of the above and I have provided the patient with a written personalized care plan for preventive services.     Norleen Jobs, MD   04/04/2024

## 2024-04-05 ENCOUNTER — Ambulatory Visit: Payer: Self-pay | Admitting: Family Medicine

## 2024-04-05 LAB — COMPREHENSIVE METABOLIC PANEL WITH GFR
ALT: 21 IU/L (ref 0–32)
AST: 28 IU/L (ref 0–40)
Albumin: 4.3 g/dL (ref 3.9–4.9)
Alkaline Phosphatase: 97 IU/L (ref 44–121)
BUN/Creatinine Ratio: 30 — ABNORMAL HIGH (ref 12–28)
BUN: 21 mg/dL (ref 8–27)
Bilirubin Total: 0.5 mg/dL (ref 0.0–1.2)
CO2: 22 mmol/L (ref 20–29)
Calcium: 9.6 mg/dL (ref 8.7–10.3)
Chloride: 101 mmol/L (ref 96–106)
Creatinine, Ser: 0.71 mg/dL (ref 0.57–1.00)
Globulin, Total: 2.8 g/dL (ref 1.5–4.5)
Glucose: 99 mg/dL (ref 70–99)
Potassium: 4.2 mmol/L (ref 3.5–5.2)
Sodium: 137 mmol/L (ref 134–144)
Total Protein: 7.1 g/dL (ref 6.0–8.5)
eGFR: 94 mL/min/1.73 (ref >59)

## 2024-04-05 LAB — CBC WITH DIFFERENTIAL/PLATELET
Basophils Absolute: 0 x10E3/uL (ref 0.0–0.2)
Basos: 0 %
EOS (ABSOLUTE): 0.1 10*3/uL (ref 0.0–0.4)
Eos: 2 %
Hematocrit: 43.4 % (ref 34.0–46.6)
Hemoglobin: 14.3 g/dL (ref 11.1–15.9)
Immature Grans (Abs): 0 x10E3/uL (ref 0.0–0.1)
Immature Granulocytes: 0 %
Lymphocytes Absolute: 1.5 10*3/uL (ref 0.7–3.1)
Lymphs: 24 %
MCH: 30.4 pg (ref 26.6–33.0)
MCHC: 32.9 g/dL (ref 31.5–35.7)
MCV: 92 fL (ref 79–97)
Monocytes Absolute: 0.6 10*3/uL (ref 0.1–0.9)
Monocytes: 9 %
Neutrophils Absolute: 4 x10E3/uL (ref 1.4–7.0)
Neutrophils: 65 %
Platelets: 254 10*3/uL (ref 150–450)
RBC: 4.71 x10E6/uL (ref 3.77–5.28)
RDW: 12 % (ref 11.7–15.4)
WBC: 6.2 x10E3/uL (ref 3.4–10.8)

## 2024-04-05 LAB — LIPID PANEL
Chol/HDL Ratio: 1.6 ratio (ref 0.0–4.4)
Cholesterol, Total: 114 mg/dL (ref 100–199)
HDL: 73 mg/dL (ref >39)
LDL Chol Calc (NIH): 31 mg/dL (ref 0–99)
Triglycerides: 35 mg/dL (ref 0–149)
VLDL Cholesterol Cal: 10 mg/dL (ref 5–40)

## 2024-05-04 ENCOUNTER — Ambulatory Visit (INDEPENDENT_AMBULATORY_CARE_PROVIDER_SITE_OTHER): Admitting: Adult Health

## 2024-05-04 ENCOUNTER — Encounter (INDEPENDENT_AMBULATORY_CARE_PROVIDER_SITE_OTHER): Payer: Self-pay | Admitting: Adult Health

## 2024-05-04 VITALS — BP 100/63 | HR 58 | Temp 98.1°F | Ht 71.0 in | Wt 209.0 lb

## 2024-05-04 DIAGNOSIS — F419 Anxiety disorder, unspecified: Secondary | ICD-10-CM

## 2024-05-04 DIAGNOSIS — E669 Obesity, unspecified: Secondary | ICD-10-CM

## 2024-05-04 DIAGNOSIS — Z6829 Body mass index (BMI) 29.0-29.9, adult: Secondary | ICD-10-CM

## 2024-05-04 DIAGNOSIS — E1159 Type 2 diabetes mellitus with other circulatory complications: Secondary | ICD-10-CM | POA: Diagnosis not present

## 2024-05-04 DIAGNOSIS — E1169 Type 2 diabetes mellitus with other specified complication: Secondary | ICD-10-CM

## 2024-05-04 DIAGNOSIS — I152 Hypertension secondary to endocrine disorders: Secondary | ICD-10-CM | POA: Diagnosis not present

## 2024-05-04 DIAGNOSIS — F32A Depression, unspecified: Secondary | ICD-10-CM

## 2024-05-04 DIAGNOSIS — Z7985 Long-term (current) use of injectable non-insulin antidiabetic drugs: Secondary | ICD-10-CM

## 2024-05-04 MED ORDER — SEMAGLUTIDE (1 MG/DOSE) 4 MG/3ML ~~LOC~~ SOPN: 1.0000 mg | PEN_INJECTOR | SUBCUTANEOUS | 0 refills | Status: DC

## 2024-05-04 NOTE — Progress Notes (Signed)
 WEIGHT SUMMARY AND BIOMETRICS  Vitals Temp: 98.1 F (36.7 C) BP: 100/63 Pulse Rate: (!) 58 SpO2: 96 %   Anthropometric Measurements Height: 5' 11 (1.803 m) Weight: 200 lb (90.7 kg) BMI (Calculated): 27.91 Weight at Last Visit: 213 lb Weight Lost Since Last Visit: 13 lb Weight Gained Since Last Visit: 0 Starting Weight: 268 lb Total Weight Loss (lbs): 68 lb (30.8 kg) Peak Weight: 286 lb   Body Composition  Body Fat %: 38.6 % Fat Mass (lbs): 80.6 lbs Muscle Mass (lbs): 122 lbs Total Body Water  (lbs): 86.4 lbs Visceral Fat Rating : 10   Other Clinical Data Fasting: yes Labs: no Today's Visit #: 41 Starting Date: 11/14/20 Comments: re' kd height tody    Chief Complaint:   OBESITY Laurie Mckinney is here to discuss her progress with her obesity treatment plan.  She is on the the Category 3 Plan and states she is following her eating plan approximately 50 % of the time.  She states she is exercising Walking 30 minutes 7 times per week.  Interim History:  04/04/2024 PCP OV with Labs Reviewed results with pt at length  Reviewed Bioimpedance results with pt: Muscle Mass: +1.4 lbs Adipose Mass: -5.4 lbs  Current weight 209 lbs, goal weight <200 lbs   Stress-  Husband retired at end of May 2025  Husband suffered MI that required PCI with two stents  Subjective:   1.  Type 2 diabetes mellitus with other specified complication, without long-term current use of insulin  (HCC) Lab Results  Component Value Date   HGBA1C 6.2 (A) 04/04/2024   HGBA1C 6.0 (H) 11/04/2023   HGBA1C 5.8 (A) 03/23/2023    HWW manages weekly Ozempic  1mg  Denies mass in neck, dysphagia, dyspepsia, persistent hoarseness, abdominal pain, or N/V/C  She denies sx's of hypoglycemia She reports increased hunger and cravings even when full after a meal Discussed risks/benefit of increasing GLP-1 therapy  2. Hypertension associated with diabetes (HCC) BP Soft at OV She reports dizziness the  last several days She doesn't feel that she is drinking enough water  She denies CP or palpitations PCP manages lisinopril -hydrochlorothiazide  (ZESTORETIC ) 20-25 MG tablet  HWW manages weekly Ozempic  1mg   3. Anxiety and Depression PCP manages daily Wellbutrin  SR 200mg  and Paxil  20mg  She endorses increased stress and anxiety r/t to her relationship with her husband. He has recently retired and tends to hover around her when she is trying to accomplish house work. He also suffered MI that required PCI with two stents.    She vehemently denies SI/HI  She is agreeable to referral to psychology  Assessment/Plan:   1. Type 2 diabetes mellitus with other specified complication, without long-term current use of insulin  (HCC) Refill - Semaglutide , 1 MG/DOSE, 4 MG/3ML SOPN; Inject 1 mg as directed once a week.  Dispense: 9 mL; Refill: 0 INCREASE PROTEIN-30 g per meal INCREASE WATER  INTAKE  2. Hypertension associated with diabetes (HCC) INCREASE WATER  INTAKE F/u with PCP, re: dizziness  3. Anxiety and Depression Refer Ambulatory referral to Psychology         Internal Referral, Routine, [facility], Behavioral Health, Specialty Services Required   4. BMI 29.0-29.9,adult current 29.1  Laurie Mckinney is currently in the action stage of change. As such, her goal is to continue with weight loss efforts. She has agreed to the Category 3 Plan.   Exercise goals: Older adults should follow the adult guidelines. When older adults cannot meet the adult guidelines, they should  be as physically active as their abilities and conditions will allow.  Older adults should do exercises that maintain or improve balance if they are at risk of falling.  Older adults should determine their level of effort for physical activity relative to their level of fitness.  Older adults with chronic conditions should understand whether and how their conditions affect their ability to do regular physical activity  safely.  Behavioral modification strategies: increasing lean protein intake, decreasing simple carbohydrates, increasing vegetables, increasing water  intake, meal planning and cooking strategies, keeping healthy foods in the home, ways to avoid boredom eating, ways to avoid night time snacking, and planning for success.  Laurie Mckinney has agreed to follow-up with our clinic in 4 weeks. She was informed of the importance of frequent follow-up visits to maximize her success with intensive lifestyle modifications for her multiple health conditions.   Objective:   Blood pressure 100/63, pulse (!) 58, temperature 98.1 F (36.7 C), height 5' 11 (1.803 m), weight 200 lb (90.7 kg), last menstrual period 01/26/2011, SpO2 96%. Body mass index is 27.89 kg/m.  General: Cooperative, alert, well developed, in no acute distress. HEENT: Conjunctivae and lids unremarkable. Cardiovascular: Regular rhythm.  Lungs: Normal work of breathing. Neurologic: No focal deficits.   Lab Results  Component Value Date   CREATININE 0.71 04/04/2024   BUN 21 04/04/2024   NA 137 04/04/2024   K 4.2 04/04/2024   CL 101 04/04/2024   CO2 22 04/04/2024   Lab Results  Component Value Date   ALT 21 04/04/2024   AST 28 04/04/2024   ALKPHOS 97 04/04/2024   BILITOT 0.5 04/04/2024   Lab Results  Component Value Date   HGBA1C 6.2 (A) 04/04/2024   HGBA1C 6.0 (H) 11/04/2023   HGBA1C 5.8 (A) 03/23/2023   HGBA1C 5.8 (H) 10/29/2022   HGBA1C 5.9 (H) 08/03/2022   Lab Results  Component Value Date   INSULIN  12.6 11/30/2022   INSULIN  9.7 05/13/2021   INSULIN  4.0 11/14/2020   Lab Results  Component Value Date   TSH 0.724 11/14/2020   Lab Results  Component Value Date   CHOL 114 04/04/2024   HDL 73 04/04/2024   LDLCALC 31 04/04/2024   TRIG 35 04/04/2024   CHOLHDL 1.6 04/04/2024   Lab Results  Component Value Date   VD25OH 50.9 11/04/2023   VD25OH 68.8 03/23/2023   VD25OH 29.8 (L) 11/30/2022   Lab Results   Component Value Date   WBC 6.2 04/04/2024   HGB 14.3 04/04/2024   HCT 43.4 04/04/2024   MCV 92 04/04/2024   PLT 254 04/04/2024   No results found for: IRON, TIBC, FERRITIN  Attestation Statements:   Reviewed by clinician on day of visit: allergies, medications, problem list, medical history, surgical history, family history, social history, and previous encounter notes.  I have reviewed the above documentation for accuracy and completeness, and I agree with the above. -  Annelise Mccoy d. Quetzali Heinle, NP-C

## 2024-05-08 ENCOUNTER — Other Ambulatory Visit: Payer: Self-pay | Admitting: Medical Genetics

## 2024-05-23 ENCOUNTER — Ambulatory Visit: Admitting: Clinical

## 2024-05-23 ENCOUNTER — Other Ambulatory Visit (INDEPENDENT_AMBULATORY_CARE_PROVIDER_SITE_OTHER): Payer: Self-pay | Admitting: Internal Medicine

## 2024-05-23 DIAGNOSIS — E559 Vitamin D deficiency, unspecified: Secondary | ICD-10-CM

## 2024-06-06 DIAGNOSIS — E1142 Type 2 diabetes mellitus with diabetic polyneuropathy: Secondary | ICD-10-CM | POA: Diagnosis not present

## 2024-06-06 DIAGNOSIS — B351 Tinea unguium: Secondary | ICD-10-CM | POA: Diagnosis not present

## 2024-06-08 ENCOUNTER — Ambulatory Visit: Admitting: Clinical

## 2024-06-08 DIAGNOSIS — F4323 Adjustment disorder with mixed anxiety and depressed mood: Secondary | ICD-10-CM

## 2024-06-08 NOTE — Progress Notes (Addendum)
 [facility] Counselor Initial Adult Exam IN PERSON VISIT  Name: Laurie Mckinney Date: 06/08/2024 MRN: [id number] DOB: [birth year] PCP: Laurie Norleen BROCKS, MD  Time spent: 60 min  Guardian/Payee:  Self     Paperwork requested: No   Reason for Visit /Presenting Problem: Conflicts with husband (married 43 years) - Patient retired 5 years ago and husband retired a couple months ago and she's adjusting to the change in their situation  Mental Status Exam: Appearance:   Casual     Behavior:  Appropriate  Motor:  Restless  Speech/Language:   Normal Rate  Affect:  Depressed and Tearful  Mood:  anxious and depressed  Thought process:  tangential  Thought content:    WNL  Sensory/Perceptual disturbances:    WNL  Orientation:  oriented to person, place, time/date, and situation  Attention:  Fair  Concentration:  Fair  Memory:  WNL  Fund of knowledge:   Good  Insight:    Fair  Judgment:   Good  Impulse Control:  Good    Reported Symptoms:  Increased stress and anxiety  Risk Assessment: Danger to Self:  No Self-injurious Behavior: No Danger to Others: No Duty to Warn:no Physical Aggression / Violence:No  Access to Firearms a concern: No  Patient / guardian was educated about steps to take if suicide or homicide risk level increases between visits: no While future psychiatric events cannot be accurately predicted, the patient does not currently require acute inpatient psychiatric care and does not currently meet [facility]  involuntary commitment criteria.  About 2 years ago - she felt she had no purpose, had a fleeting thought of being better off dead since she didn't feel needed after she retired. No current SI or attempts to harm/kill herself.  Substance Abuse History: Current substance abuse: None reported    Past Psychiatric History:   Previous psychological history is significant for depression Outpatient Providers:About 2-3 years ago, she tried psycho  therapy,  didn't connect well and it was via telephone History of Psych Hospitalization: No  Psychological Testing: None reported   Psychiatric medication: Perimenopause in her 40's - weepy a lot, paxil  helped - Takes wellbutrin  (200) & paxil  (20 mg)  Traumatic Events/Abuse History:  When patient was 63 yo, her oldest sister committed suicide (pt's sister was 66 yo Pt's sister was pregnant and had to give the baby for adoption. In 1997 - pt's niece hired Development worker, community to find bio family and now has a relationship with patient.  Parents divorced when she was 41 yo- always been a mother's girl and youngest of 4 siblings Patient's mother grew up in foster care and mother shared stories with patient when she was growing up and remembers the difficulties her mother had. Patient's mother left the family and patient stayed with father, grew up in traditional family so father was working a lot. When mother left,patient developed a relationsip with father Bullying in elementary/middle schoool due to her size - bigger than everyone else  Family History:  Family History  Problem Relation Age of Onset   Arthritis Mother    Heart disease Father    Hypertension Father    Hyperlipidemia Father    Sudden death Father    Neuropathy Brother        both legs    Diabetes Brother        no treatment that the pt knows of    Other Brother        drank a 6 pack  of beer daily x 30 years    Sleep apnea Neg Hx    Colon cancer Neg Hx    Pancreatic cancer Neg Hx    Stomach cancer Neg Hx    Esophageal cancer Neg Hx    Rectal cancer Neg Hx     Living situation: the patient lives with their spouse - Lives with husband, Oneil and a dog (8 years old-Sugar) Always the leader in the relationship, Chief Executive Officer Husband has had health concerns in past 10 years Attiudes are so different Her cup isoverflowing and he wants her to fill him up  Relationship Status: married  Name  of spouse / other:Laurie Mckinney If a parent, number of children - 2 sons Has 2 sons (40 & 78), expecting twin boys from oldest son (son & daughter-in-law live in [place])  Support Systems: - Sons, especially oldest son  Surveyor, quantity Stress:  Yes   Income/Employment/Disability: - 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 [employer] - 63 yo Learned to do things on her own - worked, school - school in [place], had to be responsible when she went away to school ([place]  Religion/Sprituality/World View: Being a Curator - Grew up [employer] (likes message about healthcare and taking care of herself)  Any cultural differences that may affect / interfere with treatment:  Not reported  Recreation/Hobbies: kitchen work is like therapy to me, listens to Acupuncturist, music, audio books while she's cooking (multi tasking)  Stressors:  -- husband watches reels that are loud when she is doing things in the kitchen and overall adjustment to having husband at home all day after he retired - 10 yo mother recently moved into assisted facility about 2 months ago  Strengths: Able to Production manager - At 63 yo she learned to do things on her own - work, school, and had to be responsible for her daily activities   Legal History: None reported  Medical History/Surgical History: not reviewed Past Medical History:  Diagnosis Date   Adjustment insomnia    SHIFT WORK   Allergy    RHINITIS   Asthma    Atrial flutter (HCC) 02/26/2015   [facility]   Back pain    Bursitis    right hip   Cataract    see last eye eexam note    Constipation    Depression    Diabetes mellitus without complication (HCC)    History of atrial fibrillation    History of cancer    Hypertension     Incontinence    Joint pain    Migraine headache    Mobitz type 2 second degree heart block    Neuropathy    Obesity    Other fatigue    Shortness of breath on exertion    Sleep apnea     Past Surgical History:  Procedure Laterality Date   ABDOMINAL HYSTERECTOMY     ATRIAL FIBRILLATION ABLATION N/A 10/27/2019   Procedure: ATRIAL FIBRILLATION ABLATION;  Surgeon: Inocencio Soyla Lunger, MD;  [facility];  Service: Cardiovascular;  Laterality: N/A;   BREAST REDUCTION SURGERY Bilateral 11/19/2022   Procedure: BREAST REDUCTION WITH LIPOSUCTION;  Surgeon: Lowery Estefana RAMAN, DO;  Location: [facility];  Service: Plastics;  Laterality: Bilateral;  CARDIOVERSION N/A 05/25/2019   Procedure: CARDIOVERSION;  Surgeon: Alvan Dorn FALCON, MD;  Location: [facility];  Service: Endoscopy;  Laterality: N/A;   COLONOSCOPY  2011   Dr.Brodie   ROBOTIC ASSISTED TOTAL HYSTERECTOMY WITH BILATERAL SALPINGO OOPHERECTOMY Bilateral 02/05/2015   Procedure: ROBOTIC ASSISTED TOTAL LAPAROSCOPIC HYSTERECTOMY WITH BILATERAL SALPINGO OOPHORECTOMY;  Surgeon: Maurilio Ship, MD;  Location: [facility];  Service: Gynecology;  Laterality: Bilateral;    Medications: Current Outpatient Medications  Medication Sig Dispense Refill   acetaminophen  (TYLENOL ) 650 MG CR tablet Take 650 mg by mouth every 8 (eight) hours as needed for pain.     atorvastatin  (LIPITOR) 10 MG tablet Take 1 tablet (10 mg total) by mouth daily. 90 tablet 1   buPROPion  (WELLBUTRIN  SR) 200 MG 12 hr tablet Take 1 tablet (200 mg total) by mouth daily. 90 tablet 0   GEMTESA  75 MG TABS Take 1 tablet (75 mg total) by mouth daily. 90 tablet 3   Lancets (ONETOUCH DELICA PLUS LANCET33G) MISC USE AND DISCARD 1 LANCET   DAILY 100 each 3   lisinopril -hydrochlorothiazide  (ZESTORETIC ) 20-25 MG tablet TAKE 1 TABLET BY MOUTH EVERY DAY 90 tablet 3   Multiple Vitamins-Minerals (CENTRUM SILVER 50+WOMEN) TABS Take 1 tablet by mouth daily.     NONFORMULARY  OR COMPOUNDED ITEM Amantadine 8%, Baclofen 2%, Gabapentin  6%, Amitriptyline 4%, Bupivacaine  2%, Clonidine 0.2%.   Apply 1-2 grams to the affected area 3-4 times daily (Patient not taking: Reported on 03/30/2024)     ONETOUCH VERIO test strip USE AND DISCARD 1 TEST     STRIP AS NEEDED AS         INSTRUCTED (Patient not taking: Reported on 05/04/2024) 100 each 3   oxybutynin  (DITROPAN -XL) 10 MG 24 hr tablet TAKE 1 TABLET AT BEDTIME 90 tablet 1   PARoxetine  (PAXIL ) 20 MG tablet Take 1 tablet (20 mg total) by mouth daily. 90 tablet 0   rivaroxaban  (XARELTO ) 20 MG TABS tablet Take 1 tablet (20 mg total) by mouth daily with supper. 90 tablet 3   Semaglutide , 1 MG/DOSE, 4 MG/3ML SOPN Inject 1 mg as directed once a week. 9 mL 0   UNABLE TO FIND Take 1,250 mcg by mouth.     UNABLE TO FIND Take 1 tablet by mouth daily.     Vitamin D , Ergocalciferol , (DRISDOL ) 1.25 MG (50000 UNIT) CAPS capsule Take 1 capsule (50,000 Units total) by mouth every 14 (fourteen) days. 8 capsule 0   No current facility-administered medications for this visit.    No Known Allergies  Diagnoses:  Adjustment disorder with mixed anxiety and depressed mood  Plan of Care:  Scheduled follow up appointment and develop more specific treatment plan at next appointment. Ms. Filippone would like to have ADHD evaluation Homework - Identify boundaries that she wants to set with her husband  Rolin SHAUNNA Pouch, LCSW

## 2024-06-12 ENCOUNTER — Ambulatory Visit (INDEPENDENT_AMBULATORY_CARE_PROVIDER_SITE_OTHER): Admitting: Adult Health

## 2024-06-20 ENCOUNTER — Telehealth: Admitting: Physician Assistant

## 2024-06-20 DIAGNOSIS — R3989 Other symptoms and signs involving the genitourinary system: Secondary | ICD-10-CM

## 2024-06-20 NOTE — Progress Notes (Signed)
  Because of increased risk for complicated and antibiotic-resistant UTI in those 65 and older, the standard of care is for an examination to be performed and a urine culture obtained. As such, I feel your condition warrants further evaluation and I recommend that you be seen in a face-to-face visit.   NOTE: There will be NO CHARGE for this E-Visit   If you are having a true medical emergency, please call 911.     For an urgent face to face visit, [facility] has multiple urgent care centers for your convenience.  Click the link below for the full list of locations and hours, walk-in wait times, appointment scheduling options and driving directions:  Urgent Care - [place], [place], [place], [place], [place], [place]  [place]     Your MyChart E-visit questionnaire answers were reviewed by a board certified advanced clinical practitioner to complete your personal care plan based on your specific symptoms.    Thank you for using e-Visits.

## 2024-06-21 ENCOUNTER — Ambulatory Visit
Admission: RE | Admit: 2024-06-21 | Discharge: 2024-06-21 | Disposition: A | Discharge status: Home / Self Care | Source: Ambulatory Visit | Attending: Nurse Practitioner | Admitting: Nurse Practitioner

## 2024-06-21 VITALS — BP 104/65 | HR 68 | Temp 98.7°F | Resp 20

## 2024-06-21 DIAGNOSIS — R319 Hematuria, unspecified: Secondary | ICD-10-CM | POA: Diagnosis not present

## 2024-06-21 DIAGNOSIS — Z87440 Personal history of urinary (tract) infections: Secondary | ICD-10-CM | POA: Insufficient documentation

## 2024-06-21 DIAGNOSIS — R399 Unspecified symptoms and signs involving the genitourinary system: Secondary | ICD-10-CM | POA: Diagnosis not present

## 2024-06-21 LAB — POCT URINE DIPSTICK
Bilirubin, UA: NEGATIVE
Glucose, UA: NEGATIVE mg/dL
Ketones, POC UA: NEGATIVE mg/dL
Nitrite, UA: NEGATIVE
POC PROTEIN,UA: NEGATIVE
Spec Grav, UA: 1.015 (ref 1.010–1.025)
Urobilinogen, UA: 0.2 U/dL
pH, UA: 5.5 (ref 5.0–8.0)

## 2024-06-21 MED ORDER — CEPHALEXIN 500 MG PO CAPS: 500.0000 mg | ORAL_CAPSULE | Freq: Four times a day (QID) | ORAL | 0 refills | Status: AC

## 2024-06-21 NOTE — ED Provider Notes (Signed)
 [facility]    CSN: [id number] Arrival date & time: 06/21/24  1043      History   Chief Complaint Chief Complaint  Patient presents with   Urinary Frequency    Burning ChillsFrequency Spotting - Entered by patient    HPI LATRELL REITAN is a 63 y.o. female.   The history is provided by the patient.   Patient presents with a 2-day history of burning with urination, urinary frequency, hematuria, chills, and urgency.  Patient denies fever, flank pain, low back pain, decreased urine stream, or vaginal symptoms.  Patient reports prior history of recurrent urinary tract infections.  States her last UTI was approximately 1 year ago.  So far, states that she has taken Azo for her symptoms.    Past Medical History:  Diagnosis Date   Adjustment insomnia    SHIFT WORK   Allergy    RHINITIS   Asthma    Atrial flutter (HCC) 02/26/2015   [facility]   Back pain    Bursitis    right hip   Cataract    see last eye eexam note    Constipation    Depression    Diabetes mellitus without complication (HCC)    History of atrial fibrillation    History of cancer    Hypertension    Incontinence    Joint pain    Migraine headache    Mobitz type 2 second degree heart block    Neuropathy    Obesity    Other fatigue    Shortness of breath on exertion    Sleep apnea     Patient Active Problem List   Diagnosis Date Noted   Hx of adenomatous colonic polyps 04/02/2024   H/O bilateral breast reduction surgery 03/23/2023   Cataract associated with type 2 diabetes mellitus (HCC) 01/25/2023   Hypertensive retinopathy of both eyes 01/25/2023   OSA on CPAP 08/09/2022   Recurrent UTI 09/02/2021   Class 2 severe obesity with serious comorbidity and body mass index (BMI) of 37.0 to 37.9 in adult (HCC) 12/12/2020   Hyperlipidemia associated with type 2 diabetes mellitus (HCC) 11/14/2020   History of hysterectomy for cancer 08/19/2020   Secondary hypercoagulable  state (HCC) 11/22/2019   Female stress incontinence 10/04/2019   Diabetic neuropathy (HCC) 09/14/2019   Varicose veins of lower extremity 04/14/2019   Atrial fibrillation (HCC) 04/14/2019   Hearing loss 04/13/2019   Diabetes mellitus (HCC) 09/29/2018   Second degree heart block 05/05/2018   Chronic low back pain without sciatica 05/05/2018   Hypertension associated with diabetes (HCC) 09/20/2017   Actinic keratosis 02/24/2017   Dysthymia 01/10/2016   History of endometrial cancer 02/05/2015    Class: Stage 1   History of migraine headaches 03/18/2011   Allergic rhinitis due to pollen 03/18/2011   Obesity (BMI 30-39.9) 03/18/2011    Past Surgical History:  Procedure Laterality Date   ABDOMINAL HYSTERECTOMY     ATRIAL FIBRILLATION ABLATION N/A 10/27/2019   Procedure: ATRIAL FIBRILLATION ABLATION;  Surgeon: Inocencio Soyla Lunger, MD;  [facility];  Service: Cardiovascular;  Laterality: N/A;   BREAST REDUCTION SURGERY Bilateral 11/19/2022   Procedure: BREAST REDUCTION WITH LIPOSUCTION;  Surgeon: Lowery Estefana RAMAN, DO;  Location: [facility];  Service: Plastics;  Laterality: Bilateral;   CARDIOVERSION N/A 05/25/2019   Procedure: CARDIOVERSION;  Surgeon: Alvan Dorn FALCON, MD;  Location: [facility];  Service: Endoscopy;  Laterality: N/A;   COLONOSCOPY  2011  Dr.Brodie   ROBOTIC ASSISTED TOTAL HYSTERECTOMY WITH BILATERAL SALPINGO OOPHERECTOMY Bilateral 02/05/2015   Procedure: ROBOTIC ASSISTED TOTAL LAPAROSCOPIC HYSTERECTOMY WITH BILATERAL SALPINGO OOPHORECTOMY;  Surgeon: Maurilio Ship, MD;  Location: [facility];  Service: Gynecology;  Laterality: Bilateral;    OB History     Gravida  2   Para  2   Term      Preterm      AB      Living         SAB      IAB      Ectopic      Multiple      Live Births               Home Medications    Prior to Admission medications   Medication Sig Start Date End Date Taking? Authorizing Provider   acetaminophen  (TYLENOL ) 650 MG CR tablet Take 650 mg by mouth every 8 (eight) hours as needed for pain.    [provider]  atorvastatin  (LIPITOR) 10 MG tablet Take 1 tablet (10 mg total) by mouth daily. 04/04/24   Joyce Norleen BROCKS, MD  buPROPion  (WELLBUTRIN  SR) 200 MG 12 hr tablet Take 1 tablet (200 mg total) by mouth daily. 04/04/24   Joyce Norleen BROCKS, MD  GEMTESA  75 MG TABS Take 1 tablet (75 mg total) by mouth daily. 04/04/24   Joyce Norleen BROCKS, MD  Lancets [facility] DELICA PLUS [place]) MISC USE AND DISCARD 1 LANCET   DAILY 11/04/20   Joyce Norleen BROCKS, MD  lisinopril -hydrochlorothiazide  (ZESTORETIC ) 20-25 MG tablet TAKE 1 TABLET BY MOUTH EVERY DAY 02/16/24   Lalonde, John C, MD  Multiple Vitamins-Minerals (CENTRUM SILVER 50+WOMEN) TABS Take 1 tablet by mouth daily.    [provider]  NONFORMULARY OR COMPOUNDED ITEM Amantadine 8%, Baclofen 2%, Gabapentin  6%, Amitriptyline 4%, Bupivacaine  2%, Clonidine 0.2%.   Apply 1-2 grams to the affected area 3-4 times daily Patient not taking: Reported on 03/30/2024    Sherryl Bouchard, NP  [facility] VERIO test strip USE AND DISCARD 1 TEST     STRIP AS NEEDED AS         INSTRUCTED Patient not taking: Reported on 05/04/2024 11/04/20   Joyce Norleen BROCKS, MD  oxybutynin  (DITROPAN -XL) 10 MG 24 hr tablet TAKE 1 TABLET AT BEDTIME 10/13/21   Joyce Norleen BROCKS, MD  PARoxetine  (PAXIL ) 20 MG tablet Take 1 tablet (20 mg total) by mouth daily. 04/04/24   Joyce Norleen BROCKS, MD  rivaroxaban  (XARELTO ) 20 MG TABS tablet Take 1 tablet (20 mg total) by mouth daily with supper. 04/04/24   Joyce Norleen BROCKS, MD  Semaglutide , 1 MG/DOSE, 4 MG/3ML SOPN Inject 1 mg as directed once a week. 05/04/24   Danford, Rockie D, NP  UNABLE TO FIND Take 1,250 mcg by mouth. 12/21/23   [provider]  UNABLE TO FIND Take 1 tablet by mouth daily.    [provider]  Vitamin D , Ergocalciferol , (DRISDOL ) 1.25 MG (50000 UNIT) CAPS capsule Take 1 capsule (50,000 Units total) by mouth every  14 (fourteen) days. 12/21/23   Francyne Romano, MD    Family History Family History  Problem Relation Age of Onset   Arthritis Mother    Heart disease Father    Hypertension Father    Hyperlipidemia Father    Sudden death Father    Neuropathy Brother        both legs    Diabetes Brother        no treatment  that the pt knows of    Other Brother        drank a 6 pack of beer daily x 30 years    Sleep apnea Neg Hx    Colon cancer Neg Hx    Pancreatic cancer Neg Hx    Stomach cancer Neg Hx    Esophageal cancer Neg Hx    Rectal cancer Neg Hx     Social History Social History   Tobacco Use   Smoking status: Never   Smokeless tobacco: Never  Vaping Use   Vaping status: Never Used  Substance Use Topics   Alcohol use: Not Currently    Alcohol/week: 1.0 standard drink of alcohol    Types: 1 Standard drinks or equivalent per week   Drug use: No     Allergies   Patient has no known allergies.   Review of Systems Review of Systems Per HPI  Physical Exam Triage Vital Signs ED Triage Vitals [06/21/24 1135]  Encounter Vitals Group     BP 104/65     Girls Systolic BP Percentile      Girls Diastolic BP Percentile      Boys Systolic BP Percentile      Boys Diastolic BP Percentile      Pulse Rate 68     Resp 20     Temp 98.7 F (37.1 C)     Temp Source Oral     SpO2 95 %     Weight      Height      Head Circumference      Peak Flow      Pain Score      Pain Loc      Pain Education      Exclude from Growth Chart    No data found.  Updated Vital Signs BP 104/65 (BP Location: Right Arm)   Pulse 68   Temp 98.7 F (37.1 C) (Oral)   Resp 20   LMP 01/26/2011   SpO2 95%   Visual Acuity Right Eye Distance:   Left Eye Distance:   Bilateral Distance:    Right Eye Near:   Left Eye Near:    Bilateral Near:     Physical Exam Vitals and nursing note reviewed.  Constitutional:      General: She is not in acute distress.    Appearance: Normal appearance.   HENT:     Head: Normocephalic.  Eyes:     Extraocular Movements: Extraocular movements intact.     Pupils: Pupils are equal, round, and reactive to light.  Cardiovascular:     Rate and Rhythm: Regular rhythm.     Pulses: Normal pulses.     Heart sounds: Normal heart sounds.  Pulmonary:     Effort: Pulmonary effort is normal. No respiratory distress.     Breath sounds: Normal breath sounds. No stridor. No wheezing, rhonchi or rales.  Abdominal:     General: Bowel sounds are normal.     Palpations: Abdomen is soft.     Tenderness: There is no abdominal tenderness. There is no right CVA tenderness or left CVA tenderness.  Musculoskeletal:     Cervical back: Normal range of motion.  Skin:    General: Skin is warm and dry.  Neurological:     General: No focal deficit present.     Mental Status: She is alert and oriented to person, place, and time.  Psychiatric:        Mood and Affect: Mood  normal.        Behavior: Behavior normal.      UC Treatments / Results  Labs (all labs ordered are listed, but only abnormal results are displayed) Labs Reviewed  POCT URINE DIPSTICK - Abnormal; Notable for the following components:      Result Value   Blood, UA moderate (*)    Leukocytes, UA Small (1+) (*)    All other components within normal limits  URINE CULTURE    EKG   Radiology No results found.  Procedures Procedures (including critical care time)  Medications Ordered in UC Medications - No data to display  Initial Impression / Assessment and Plan / UC Course  I have reviewed the triage vital signs and the nursing notes.  Pertinent labs & imaging results that were available during my care of the patient were reviewed by me and considered in my medical decision making (see chart for details).  Urinalysis positive for leukocytes and blood, suggestive of a UTI.  Urine culture is pending.  In the interim, we will treat patient empirically with Keflex  500 mg 4 times daily  for the next 7 days.  Supportive care recommendations were provided and discussed with the patient to include fluids, rest, developing a toileting schedule, and avoiding caffeine.  Patient was advised to follow-up with her PCP if the results of the culture are negative and she continues to experience symptoms.  Patient was in agreement with this plan of care and verbalizes understanding.  All questions were answered.  Patient stable for discharge.  Final Clinical Impressions(s) / UC Diagnoses   Final diagnoses:  UTI symptoms   Discharge Instructions   None    ED Prescriptions   None    PDMP not reviewed this encounter.   Gilmer Etta PARAS, NP 06/21/24 1222

## 2024-06-21 NOTE — ED Triage Notes (Signed)
 Pt reports Burning with urination, urinary frequency, urinary urgency, chills when urinating x 3 days. Has used OTC AZO, last taken this morning.

## 2024-06-21 NOTE — Discharge Instructions (Signed)
 A urine culture has been ordered.  You will be contacted when the results of the culture are received.  If the results of the culture are negative and you are continuing to experience symptoms, please follow-up with your primary care physician for further evaluation.  You will also have access to your results via MyChart. Take medication as prescribed. Increase fluids and allow for plenty of rest. You may take over-the-counter Tylenol  as needed for pain, fever, or general discomfort. Make sure you are drinking at least 8-10 8 ounce glasses of water  daily while symptoms persist. Avoid caffeine such as tea, soda, or coffee while symptoms persist. Develop a toileting schedule that will allow you to urinate at least every 2 hours. Follow-up as needed.

## 2024-06-23 LAB — URINE CULTURE: Culture: 80000 — AB

## 2024-06-30 ENCOUNTER — Other Ambulatory Visit: Payer: Self-pay

## 2024-06-30 DIAGNOSIS — Z0000 Encounter for general adult medical examination without abnormal findings: Secondary | ICD-10-CM

## 2024-07-04 ENCOUNTER — Ambulatory Visit: Admitting: Clinical

## 2024-07-04 ENCOUNTER — Ambulatory Visit (INDEPENDENT_AMBULATORY_CARE_PROVIDER_SITE_OTHER): Admitting: Adult Health

## 2024-07-04 VITALS — BP 124/71 | HR 67 | Temp 98.3°F | Ht 71.0 in | Wt 211.0 lb

## 2024-07-04 DIAGNOSIS — F4323 Adjustment disorder with mixed anxiety and depressed mood: Secondary | ICD-10-CM

## 2024-07-04 DIAGNOSIS — Z7985 Long-term (current) use of injectable non-insulin antidiabetic drugs: Secondary | ICD-10-CM

## 2024-07-04 DIAGNOSIS — E1169 Type 2 diabetes mellitus with other specified complication: Secondary | ICD-10-CM

## 2024-07-04 DIAGNOSIS — Z6829 Body mass index (BMI) 29.0-29.9, adult: Secondary | ICD-10-CM

## 2024-07-04 DIAGNOSIS — E1159 Type 2 diabetes mellitus with other circulatory complications: Secondary | ICD-10-CM | POA: Diagnosis not present

## 2024-07-04 DIAGNOSIS — I152 Hypertension secondary to endocrine disorders: Secondary | ICD-10-CM

## 2024-07-04 DIAGNOSIS — E559 Vitamin D deficiency, unspecified: Secondary | ICD-10-CM | POA: Diagnosis not present

## 2024-07-04 MED ORDER — VITAMIN D (ERGOCALCIFEROL) 1.25 MG (50000 UNIT) PO CAPS: 50000.0000 [IU] | ORAL_CAPSULE | ORAL | 0 refills | Status: AC

## 2024-07-04 NOTE — Progress Notes (Signed)
 WEIGHT SUMMARY AND BIOMETRICS  Vitals Temp: 98.3 F (36.8 C) BP: 124/71 Pulse Rate: 67 SpO2: 97 %   Anthropometric Measurements Height: 5' 11 (1.803 m) Weight: 211 lb (95.7 kg) BMI (Calculated): 29.44 Weight at Last Visit: 209 lb Weight Lost Since Last Visit: 0 Weight Gained Since Last Visit: 2 Starting Weight: 268 lb Total Weight Loss (lbs): 57 lb (25.9 kg)   Body Composition  Body Fat %: 40.4 % Fat Mass (lbs): 85.4 lbs Muscle Mass (lbs): 119.6 lbs Total Body Water  (lbs): 85.6 lbs Visceral Fat Rating : 11   Other Clinical Data Fasting: no Labs: no Today's Visit #: 42 Starting Date: 11/14/20    Chief Complaint:   OBESITY Laurie Mckinney is here to discuss her progress with her obesity treatment plan.  She is on the the Category 3 Plan and states she is following her eating plan approximately 45 % of the time.  She states she is exercising Walking 30-40 minutes 7 times per week.  Interim History:  March 05, 2024- Insurance Change [employer] to [employer]  Last refill of Ozempic  1mg  for 3 month supply out of pocket cost >$400- she declined refill  She estimates to have 3 months of Ozempic  1mg  at home.  Discussed replacing Ozempic  was daily Metformin therapy in future if Ozempic  becomes cost prohibitive    Her son and his wife are expecting twin boys Jan 2026.  Subjective:   1. Hypertension associated with diabetes (HCC) BP at goal She denies CP with exertion. She denies tobacco/vape use She is on  lisinopril -hydrochlorothiazide  (ZESTORETIC ) 20-25 MG tablet  atorvastatin  (LIPITOR) 10 MG tablet  rivaroxaban  (XARELTO ) 20 MG TABS tablet  Semaglutide , 1 MG/DOSE, 4 MG/3ML SOPN   She is checking home BP, left log at home. Her recall, numbers are pretty  2. Vitamin D  deficiency  Latest Reference Range & Units 11/30/22 13:03 03/23/23 11:59 11/04/23 11:08  Vitamin D , 25-Hydroxy 30.0 - 100.0 ng/mL 29.8 (L) 68.8 50.9  (L): Data  is abnormally low  She is on bi-weekly Ergocalciferol - denies N/V/Muscle Weakness  3. Type 2 diabetes mellitus with other specified complication, without long-term current use of insulin  (HCC) Lab Results  Component Value Date   HGBA1C 6.2 (A) 04/04/2024   HGBA1C 6.0 (H) 11/04/2023   HGBA1C 5.8 (A) 03/23/2023    Last fasting CBG was checked yesterday, reading: 111 She is on weekly Ozempic  1mg - administers on Fridays Denies mass in neck, dysphagia, dyspepsia, persistent hoarseness, abdominal pain, or N/V/worsening constipation.  Discussed replacing Ozempic  was daily Metformin therapy in future if Ozempic  becomes cost prohibitive    Assessment/Plan:   1. Hypertension associated with diabetes (HCC) Continue healthy eating and increase daily activity  2. Vitamin D  deficiency (Primary) Refill  Vitamin D , Ergocalciferol , (DRISDOL ) 1.25 MG (50000 UNIT) CAPS capsule Take 1 capsule (50,000 Units total) by mouth every 14 (fourteen) days. Dispense: 8 capsule, Refills: 0 ordered   3. Type 2 diabetes mellitus with other specified complication, without long-term current use of insulin  (HCC) Continue healthy eating and increase daily activity  4. BMI 29.0-29.9,adult current 29.5  Laurie Mckinney is currently in the action stage of change. As such, her goal is to continue with weight loss efforts. She has agreed to the Category 3 Plan.   Exercise goals: Older adults should follow the adult guidelines. When older adults cannot meet the adult guidelines, they should be as physically active as their abilities and conditions will allow.  Older adults should  do exercises that maintain or improve balance if they are at risk of falling.  Older adults should determine their level of effort for physical activity relative to their level of fitness.  Older adults with chronic conditions should understand whether and how their conditions affect their ability to do regular physical activity safely.  Behavioral  modification strategies: increasing lean protein intake, decreasing simple carbohydrates, increasing vegetables, increasing water  intake, meal planning and cooking strategies, keeping healthy foods in the home, ways to avoid boredom eating, and planning for success.  Laurie Mckinney has agreed to follow-up with our clinic in 4 weeks. She was informed of the importance of frequent follow-up visits to maximize her success with intensive lifestyle modifications for her multiple health conditions.   Objective:   Blood pressure 124/71, pulse 67, temperature 98.3 F (36.8 C), height 5' 11 (1.803 m), weight 211 lb (95.7 kg), last menstrual period 01/26/2011, SpO2 97%. Body mass index is 29.43 kg/m.  General: Cooperative, alert, well developed, in no acute distress. HEENT: Conjunctivae and lids unremarkable. Cardiovascular: Regular rhythm.  Lungs: Normal work of breathing. Neurologic: No focal deficits.   Lab Results  Component Value Date   CREATININE 0.71 04/04/2024   BUN 21 04/04/2024   NA 137 04/04/2024   K 4.2 04/04/2024   CL 101 04/04/2024   CO2 22 04/04/2024   Lab Results  Component Value Date   ALT 21 04/04/2024   AST 28 04/04/2024   ALKPHOS 97 04/04/2024   BILITOT 0.5 04/04/2024   Lab Results  Component Value Date   HGBA1C 6.2 (A) 04/04/2024   HGBA1C 6.0 (H) 11/04/2023   HGBA1C 5.8 (A) 03/23/2023   HGBA1C 5.8 (H) 10/29/2022   HGBA1C 5.9 (H) 08/03/2022   Lab Results  Component Value Date   INSULIN  12.6 11/30/2022   INSULIN  9.7 05/13/2021   INSULIN  4.0 11/14/2020   Lab Results  Component Value Date   TSH 0.724 11/14/2020   Lab Results  Component Value Date   CHOL 114 04/04/2024   HDL 73 04/04/2024   LDLCALC 31 04/04/2024   TRIG 35 04/04/2024   CHOLHDL 1.6 04/04/2024   Lab Results  Component Value Date   VD25OH 50.9 11/04/2023   VD25OH 68.8 03/23/2023   VD25OH 29.8 (L) 11/30/2022   Lab Results  Component Value Date   WBC 6.2 04/04/2024   HGB 14.3 04/04/2024    HCT 43.4 04/04/2024   MCV 92 04/04/2024   PLT 254 04/04/2024   No results found for: IRON, TIBC, FERRITIN  Attestation Statements:   Reviewed by clinician on day of visit: allergies, medications, problem list, medical history, surgical history, family history, social history, and previous encounter notes.  I have reviewed the above documentation for accuracy and completeness, and I agree with the above. -  Mcguire Gasparyan d. Lathaniel Legate, NP-C

## 2024-07-04 NOTE — Progress Notes (Signed)
 [facility] Counselor/Therapist Progress Note - IN-PERSON  Patient ID: BOSTYN KUNKLER, MRN: [id number]    Date: 07/04/2024  Time Spent: 12:45-1:45 60 min Minutes  Types of Service: Individual psychotherapy  Presenting Concerns: Increased stress with conflicts between her & spouse  Mental Status Exam: Appearance:  Casual     Behavior: Appropriate  Motor: Normal  Speech/Language:  Normal Rate  Affect: Appropriate  Mood: anxious and irritable  Thought process: tangential  Thought content:   WNL  Sensory/Perceptual disturbances:   WNL  Orientation: oriented to person, place, time/date, and situation  Attention: Fair  Concentration: Fair  Memory: WNL  Fund of knowledge:  Good  Insight:   Good  Judgment:  Good  Impulse Control: Good   Risk Assessment: Danger to Self:  No Self-injurious Behavior: No Danger to Others: No Duty to Warn:no   Subjective:   Patti presented to be alert and was able to verbalize ongoing stress with relationship with her spouse.  They are adjusting to being home together most of the time since Patti's husband retired earlier this year.   Interventions: Assertiveness/Communication and Psycho-education/Bibliotherapy - Love languages, Reviewed information on different types of boundaries and her current expectations of her spouse. Identified each of their strengths patterns for many years.  Client Response: Willy reported she spoke to her husband about setting boundaries and he seemed upset with it since this was the first time Willy has set boundaries in their relationship.  Willy shared that she's always made decisions in their relationship and she would like him to make more decisions, specifically with activities that he can participate in by himself. Willy acknowledged that her husband has always worked for years since he was young and wasn't involved in other activities, so he may not be interested or know what to do.  Willy was  open asking her husband directly about what he wants to do and his expectations about their relationship.   Diagnosis:  Adjustment disorder with mixed anxiety and depressed mood    Goals, Assessment & Plan:   Continue with individual psycho therapy   Frequency: 1-2 times/month  Modality: In-person    Goal: Increase knowledge of strategies & communication skills to enhance relationship with her husband as evidenced by self-report. - Willy was open to communicating with her husband about starting to make more decisions about various things in their relationship. She will start by talking to him about making a decision with dinner one night a week.  Target Date: 12/03/2024  Progress: Ongoing    Khai Torbert P. Trudy, MSW, LCSW [employer] Therapist Main Office: [phone]

## 2024-07-06 ENCOUNTER — Ambulatory Visit: Attending: Nurse Practitioner | Admitting: Nurse Practitioner

## 2024-07-06 ENCOUNTER — Encounter: Payer: Self-pay | Admitting: Nurse Practitioner

## 2024-07-06 ENCOUNTER — Ambulatory Visit: Admitting: Adult Health

## 2024-07-06 VITALS — BP 124/74 | HR 56 | Ht 71.0 in | Wt 218.8 lb

## 2024-07-06 DIAGNOSIS — I10 Essential (primary) hypertension: Secondary | ICD-10-CM

## 2024-07-06 DIAGNOSIS — G4733 Obstructive sleep apnea (adult) (pediatric): Secondary | ICD-10-CM | POA: Diagnosis not present

## 2024-07-06 DIAGNOSIS — I4892 Unspecified atrial flutter: Secondary | ICD-10-CM

## 2024-07-06 DIAGNOSIS — I4891 Unspecified atrial fibrillation: Secondary | ICD-10-CM

## 2024-07-06 NOTE — Patient Instructions (Addendum)

## 2024-07-06 NOTE — Progress Notes (Signed)
 Cardiology Office Note   Date:  07/06/2024 ID:  Daleyssa, Loiselle [birth year], MRN [id number] PCP: Joyce Norleen BROCKS, MD  [facility] Providers Cardiologist:  Alvan Carrier, MD Electrophysiologist:  Soyla Gladis Norton, MD     History of Present Illness Laurie Mckinney is a 63 y.o. female with a PMH of chest pain, A-fib/A-flutter, hypertension, type 2 diabetes, hx of past stroke-like symptoms, OSA on CPAP, who presents today for 1 year follow-up.  History of previous ablation for A-fib.  Last seen by Dr. Alvan on Feb 16, 2023.  Blood pressure was mildly elevated, but last clinic appointments showed BP that was well-controlled.  Today she is here for 1 year follow-up.  She states she is doing well. Denies any acute cardiac complaints or issues. Did discuss cost of Xarelto  and cost of Coumadin. Denies any chest pain, shortness of breath, palpitations, syncope, presyncope, dizziness, orthopnea, PND, swelling or significant weight changes, acute bleeding, or claudication.  ROS: Negative. See HPI.   Studies Reviewed  EKG:  EKG Interpretation Date/Time:  Thursday July 06 2024 13:14:32 EDT Ventricular Rate:  53 PR Interval:    QRS Duration:  104 QT Interval:  438 QTC Calculation: 410 R Axis:   -45  Text Interpretation: Atrial fibrillation with slow ventricular response Left anterior fascicular block Abnormal QRS-T angle, consider primary T wave abnormality When compared with ECG of 22-Nov-2019 11:39, Atrial fibrillation has replaced Sinus rhythm Left anterior fascicular block is now Present Criteria for Anterior infarct are no longer Present Confirmed by Miriam Norris [phone]) on 07/06/2024 1:26:12 PM   CT Cardiac 10/2019:  IMPRESSION: 1. No LAA thrombus mild LAE moderate RAE   2.  Dilated aortic root 4.0 cm   3.  Normal PV anatomy with right middle PV   4.  No pericardial effusion   5.  Calcium  score 0  IMPRESSION: 1. Ectasia of ascending thoracic aorta (4.2  cm in diameter). Recommend annual imaging followup by CTA or MRA. This recommendation follows 2010 ACCF/AHA/AATS/ACR/ASA/SCA/SCAI/SIR/STS/SVM Guidelines for the Diagnosis and Management of Patients with Thoracic Aortic Disease. Circulation. 2010; 121: Z733-z630. Aortic aneurysm NOS (ICD10-I71.9).  Echo 06/2019:  1. The left ventricle has normal systolic function, with an ejection  fraction of 55-60%. The cavity size was normal. Left ventricular diastolic  Doppler parameters are indeterminate.   2. The right ventricle has normal systolic function. The cavity was  normal. There is no increase in right ventricular wall thickness.   3. Left atrial size was mildly dilated.   4. No evidence of mitral valve stenosis.   5. The aortic valve has an indeterminate number of cusps. No stenosis of  the aortic valve.   6. The aorta is normal unless otherwise noted.   7. The aortic root is normal in size and structure.   8. Pulmonary hypertension is indeterminant, inadequate TR jet.   9. The interatrial septum was not well visualized.   FINDINGS   Left Ventricle: The left ventricle has normal systolic function, with an  ejection fraction of 55-60%. The cavity size was normal. There is no  increase in left ventricular wall thickness. Left ventricular diastolic  Doppler parameters are indeterminate  Physical Exam VS:  BP 124/74 (BP Location: Left Arm, Patient Position: Sitting, Cuff Size: Large)   Pulse (!) 56   Ht 5' 11 (1.803 m)   Wt 218 lb 12.8 oz (99.2 kg)   LMP 01/26/2011   SpO2 97%   BMI 30.52 kg/m  Wt Readings from Last 3 Encounters:  07/10/24 216 lb (98 kg)  07/06/24 218 lb 12.8 oz (99.2 kg)  07/04/24 211 lb (95.7 kg)    GEN: Well nourished, well developed in no acute distress NECK: No JVD; No carotid bruits CARDIAC: S1/S2, irregularly irregular rhythm, no murmurs, rubs, gallops RESPIRATORY:  Clear to auscultation without rales, wheezing or rhonchi  ABDOMEN: Soft,  non-tender, non-distended EXTREMITIES:  No edema; No deformity   ASSESSMENT AND PLAN  A-fib/A-flutter, s/p ablation in 2021 Denies any tachycardia or palpitations.  Heart rate 56 today and EKG confirms A-fib with slow ventricular response, she is asymptomatic with this.  Does not require AV nodal agents at this time.  She is on Xarelto  20 mg daily and is on appropriate dosage, denies any bleeding issues.  She did discuss cost issue of Xarelto  we did discuss alternative of Coumadin and what that involves with INR checks.  She wants to wait to decide with her decision.  I let her know to contact our office if she decides to switch to Coumadin.  Will provide samples of Xarelto  if this is available.  No medication changes at this time.  HTN Blood pressure is stable. Discussed to monitor BP at home at least 2 hours after medications and sitting for 5-10 minutes.  Continue current medication regimen. Heart healthy diet and regular cardiovascular exercise encouraged.   OSA on CPAP  Encouraged continued compliance.  I spent a total duration of 20 minutes reviewing prior notes, reviewing outside records including labs, EKG today, face-to-face counseling of medical condition, pathophysiology, evaluation, management, and documenting the findings in the note.    Dispo: Follow-up with MD/APP in 1 year or sooner if anything changes.  Signed, Almarie Crate, NP

## 2024-07-09 NOTE — Progress Notes (Unsigned)
 PATIENT: Laurie Mckinney DOB: 1961/09/01  REASON FOR VISIT: follow up HISTORY FROM: patient  No chief complaint on file.    HISTORY OF PRESENT ILLNESS: Today 07/09/24:  Laurie Mckinney is a 62 y.o. female with a history of ***. Returns today for follow-up.      07/07/23: Laurie Mckinney is a 63 y.o. female with a history of OSA on CPAP. Returns today for follow-up.  Reports that the CPAP is working well.  Last year she was interested in the inspire however she states her insurance will not cover the cost and now that she has learned more about this she will stick with the CPAP for now.  Her download is below  PN: not on any medication. Reports symptoms at night. Reports numbness and feels a creepy crawling sensation.  Once she gets to sleep she doesn't wake up with symptoms. No problems during the day. No change in gait or balance. Prefers to not try oral medication.      07/06/22: Laurie Mckinney is a 63 year old female with a history of obstructive sleep apnea on CPAP and peripheral neuropathy.  She returns today for follow-up.  She does use the CPAP nightly.  However she does feel that she is sleeping lighter and is more aware of the CPAP.  She does find it annoying.  She does state that she has lost weight since her original study.  She also feels that she is in better health since her original study.  Possibly interested in inspire.   Still has numbness and pain in the toes. Has the gabapentin  but does use it. SHe is just tolerating it at this time. Taking OTC supplement nervive. Walking more and feels that that has helped.     07/01/21: Laurie Mckinney is a 63 year old female with a history of obstructive sleep apnea on CPAP and neuropathy.  She returns today for follow-up.  She reports that the CPAP works well for her.  She even tries to use it when she takes a nap.  Denies any issues.  Neuropathy: She feels that is gotten slightly worse.  Still only extends up to the ankles.   Primarily has numbness in the toes.  Notices most of her discomfort when she gets in the bed.  States that she occasionally gets sharp shooting pains but otherwise the numbness is what bothers her the most.  [facility] at [facility]  Reports that she is lost 16 pounds.  Her last hemoglobin A1c was 6.7.  Reports that she is trying to lower her sugar numbers.    HISTORY (copied from Dr. Sharion note) 09/14/2019: Since I last saw patient she was diagnosed with sleep apnea, she underwent sleep study on July 09, 2019 which showed severe mixed sleep apnea and was recommended initiation of CPAP, she is been extremely compliant.  Requested back today by Roddie Bring DPM for a new issue, she had a foot exam because she has numbness in the bottom of her feet, if she has slippers on she can't tell whether her shoes are on or off, numbness, when she shaves she has numbness up her legs, a numbing sensation to above the ankles. She saw a podiatrist, she saw Dr. Joyce.  She is obese, she has diabetes, last HgbA1c was 6.6(reviewed labs) but has been higher per patient. Brother drank a 6-pack for 30 years and had neuropathy but no other family history. Really numb, no significant pain, notices it more  at night, nothing makes it better or worse, her feet get hot, ongoing for years, slowly progressive, symmetric. No other focal neurologic deficits, associated symptoms, inciting events or modifiable factors..No weakness. No lumbar radiculopathy or connection with the low back.     I reviewed patient's notes from Dr. Velma Boehringer, patient has symptoms in both feet, she has noticed this as an example shaving her legs, the feet feels a sensitive up to her ankle, she does notice numbness more so at rest without her shoes on, noticeable more so this year, recently diagnosed as diabetic, unclear how long she was prediabetic for a while, she has been advised on trying to be diet controlled, lots of healthcare  issues, dizziness, slurring speech, worsening control of hands, following, she has been evaluated by neurology and cardiology and diagnosed with A. 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 fib and Provigil  for her sleeping disorder, palpable pedal pulses, pronounced fifth metatarsal head bilaterally with callus under the right loss of deep vibratory sensation at the left foot metatarsal heads, intact but diminished positional testing of the hallux, x-rays (reviewed reports) mild residual metatarsus abductus foot type, with tailor's bunion appreciated bilaterally, spurring noted on the distal phalanx medially, mild deformity bilaterally, on lateral view relatively high arched foot type, no subluxation, dislocation of arch diagnosed with peripheral neuropathy and numbness of the foot.   TSH nml, B12 976, hepc negative.    I reviewed MRi cervical spine images and agree with the following: MRI cervical spine (without) demonstrating: - At C5-6: disc bulging with borderline mild spinal stenosis and moderate left foraminal stenosis; no cord signal abnormalities.   HPI:  Laurie Mckinney is a 63 y.o. female here as requested by Dr. Bulah for aphasia.  Past medical history obesity, Mobitz type II second-degree heart block, migraines, hypertension, diabetes, a fib, asthma, adjustment insomnia.She is here with her husband. She cannot control her body, she is wobbly, she is short of breath, she misjudges everything, she throws things and they don;t end up where she wants, her long term memory is doing well but can;t get her brain to function, poor eye-hand coordination, she feels very fatigued. Here with hr husband who provides much information. She feels her depression and  anxiety, no focal weakness, no facial droop, no dysarthria. It all started July 6th, she tends to her mother all month, she is burned out, since then she felt she couldn't focus, she wasn;t paying attention, symptoms occurred overnight.    Reviewed notes, labs and imaging from outside physicians, which showed: I reviewed Dr. Trine notes.  She was most recently seen earlier this month.  Patient reported dizziness and new onset atrial fibrillation ended up seeing A. fib clinic July 15 and was sent to the emergency room for possible stroke.  She continues to feel dizzy and loopy.  She has trouble with coordination between her brain or arms and not feeling herself.  She only has a note writing her out of work through the end of this week and that she is not safe to drive or go back to work next week.  She continues to feel dizzy and loopy.  Trouble with concentration between her brain and arms.  Trouble finding words at times are  making clear thoughts at times.  Processing speed seems off.  She is concerned about sleep apnea given history of snoring, she does have pauses in her sleep according to her husband, she is gained 30 pounds in the last 4 years   Personally reviewed MRI of the brain April 19, 2019 and agree with the following:No acute or significant brain finding. Normal study with exception of a few punctate foci of T2 and FLAIR signal within the white matter consistent with minimal small vessel change. There is a significant amount of stress, she works overnight, she doesn't sleep well.   TSH normal hgba1c 6.8 Cbc/cmp unremarkable B12 976    REVIEW OF SYSTEMS: Out of a complete 14 system review of symptoms, the patient complains only of the following symptoms, and all other reviewed systems are negative.    Fatigue severity score 16 Epworth sleepiness score 8  ALLERGIES: No Known Allergies  HOME MEDICATIONS: Outpatient Medications Prior to Visit  Medication Sig Dispense Refill    acetaminophen  (TYLENOL ) 650 MG CR tablet Take 650 mg by mouth every 8 (eight) hours as needed for pain.     atorvastatin  (LIPITOR) 10 MG tablet Take 1 tablet (10 mg total) by mouth daily. 90 tablet 1   buPROPion  (WELLBUTRIN  SR) 200 MG 12 hr tablet Take 1 tablet (200 mg total) by mouth daily. 90 tablet 0   cephALEXin  (KEFLEX ) 500 MG capsule Take 1 capsule (500 mg total) by mouth 4 (four) times daily. 28 capsule 0   GEMTESA  75 MG TABS Take 1 tablet (75 mg total) by mouth daily. 90 tablet 3   Lancets (ONETOUCH DELICA PLUS LANCET33G) MISC USE AND DISCARD 1 LANCET   DAILY 100 each 3   lisinopril -hydrochlorothiazide  (ZESTORETIC ) 20-25 MG tablet TAKE 1 TABLET BY MOUTH EVERY DAY 90 tablet 3   Multiple Vitamins-Minerals (CENTRUM SILVER 50+WOMEN) TABS Take 1 tablet by mouth daily.     ONETOUCH VERIO test strip USE AND DISCARD 1 TEST     STRIP AS NEEDED AS         INSTRUCTED 100 each 3   oxybutynin  (DITROPAN  XL) 15 MG 24 hr tablet Take 15 mg by mouth daily.     oxybutynin  (DITROPAN -XL) 10 MG 24 hr tablet TAKE 1 TABLET AT BEDTIME 90 tablet 1   PARoxetine  (PAXIL ) 20 MG tablet Take 1 tablet (20 mg total) by mouth daily. 90 tablet 0   rivaroxaban  (XARELTO ) 20 MG TABS tablet Take 1 tablet (20 mg total) by mouth daily with supper. 90 tablet 3   Semaglutide , 1 MG/DOSE, 4 MG/3ML SOPN Inject 1 mg as directed once a week. 9 mL 0   UNABLE TO FIND Take 1,250 mcg by mouth.     UNABLE TO FIND Take 1 tablet by mouth daily.     Vitamin D , Ergocalciferol , (DRISDOL ) 1.25 MG (50000 UNIT) CAPS capsule Take 1 capsule (50,000 Units total) by mouth every 14 (fourteen) days. 8 capsule 0   No facility-administered medications prior to visit.    PAST MEDICAL HISTORY: Past Medical History:  Diagnosis Date   Adjustment insomnia    SHIFT WORK   Allergy    RHINITIS   Asthma    Atrial flutter (HCC) 02/26/2015   [facility]   Back pain    Bursitis    right hip   Cataract    see last eye eexam note     Constipation    Depression    Diabetes mellitus without complication (HCC)  History of atrial fibrillation    History of cancer    Hypertension    Incontinence    Joint pain    Migraine headache    Mobitz type 2 second degree heart block    Neuropathy    Obesity    Other fatigue    Shortness of breath on exertion    Sleep apnea     PAST SURGICAL HISTORY: Past Surgical History:  Procedure Laterality Date   ABDOMINAL HYSTERECTOMY     ATRIAL FIBRILLATION ABLATION N/A 10/27/2019   Procedure: ATRIAL FIBRILLATION ABLATION;  Surgeon: Inocencio Soyla Lunger, MD;  Location: [facility];  Service: Cardiovascular;  Laterality: N/A;   BREAST REDUCTION SURGERY Bilateral 11/19/2022   Procedure: BREAST REDUCTION WITH LIPOSUCTION;  Surgeon: Lowery Estefana RAMAN, DO;  Location:  SURGERY CENTER;  Service: Plastics;  Laterality: Bilateral;   CARDIOVERSION N/A 05/25/2019   Procedure: CARDIOVERSION;  Surgeon: Alvan Dorn FALCON, MD;  Location: [facility];  Service: Endoscopy;  Laterality: N/A;   COLONOSCOPY  2011   Dr.Brodie   ROBOTIC ASSISTED TOTAL HYSTERECTOMY WITH BILATERAL SALPINGO OOPHERECTOMY Bilateral 02/05/2015   Procedure: ROBOTIC ASSISTED TOTAL LAPAROSCOPIC HYSTERECTOMY WITH BILATERAL SALPINGO OOPHORECTOMY;  Surgeon: Maurilio Ship, MD;  Location: [facility];  Service: Gynecology;  Laterality: Bilateral;    FAMILY HISTORY: Family History  Problem Relation Age of Onset   Arthritis Mother    Heart disease Father    Hypertension Father    Hyperlipidemia Father    Sudden death Father    Neuropathy Brother        both legs    Diabetes Brother        no treatment that the pt knows of    Other Brother        drank a 6 pack of beer daily x 30 years    Sleep apnea Neg Hx    Colon cancer Neg Hx    Pancreatic cancer Neg Hx    Stomach cancer Neg Hx    Esophageal cancer Neg Hx    Rectal cancer Neg Hx     SOCIAL HISTORY: Social History   Socioeconomic History   Marital status:  Married    Spouse name: Arlesia Kiel   Number of children: 2   Years of education: Not on file   Highest education level: Not on file  Occupational History   Occupation: Education administrator for parent  Tobacco Use   Smoking status: Never   Smokeless tobacco: Never  Vaping Use   Vaping status: Never Used  Substance and Sexual Activity   Alcohol use: Not Currently    Alcohol/week: 1.0 standard drink of alcohol    Types: 1 Standard drinks or equivalent per week   Drug use: No   Sexual activity: Yes    Partners: Male    Comment: Has had hysterectomy.  Other Topics Concern   Not on file  Social History Narrative   Lives at home with husband   Right handed   Caffeine: 1 cup of coffee in the morning and <20 oz of soda per day   Social Drivers of Health   Financial Resource Strain: Not on file  Food Insecurity: No Food Insecurity (03/23/2023)   Hunger Vital Sign    Worried About Running Out of Food in the Last Year: Never true    Ran Out of Food in the Last Year: Never true  Transportation Needs: No Transportation Needs (03/23/2023)   PRAPARE - Transportation    Lack of Transportation (  Medical): No    Lack of Transportation (Non-Medical): No  Physical Activity: Not on file  Stress: Not on file  Social Connections: Not on file  Intimate Partner Violence: Not At Risk (12/23/2023)   Received from [facility]   Humiliation, Afraid, Rape, and Kick questionnaire    Within the last year, have you been afraid of your partner or ex-partner?: No    Within the last year, have you been humiliated or emotionally abused in other ways by your partner or ex-partner?: No    Within the last year, have you been kicked, hit, slapped, or otherwise physically hurt by your partner or ex-partner?: No    Within the last year, have you been raped or forced to have any kind of sexual activity by your partner or ex-partner?: No      PHYSICAL EXAM  There were no vitals filed for this  visit.   There is no height or weight on file to calculate BMI.  Generalized: Well developed, in no acute distress  Chest: Lungs clear to auscultation bilaterally  Neurological examination  Mentation: Alert oriented to time, place, history taking. Follows all commands speech and language fluent Cranial nerve II-XII: Facial symmetry noted Motor: The motor testing reveals 5 over 5 strength of all 4 extremities. Good symmetric motor tone is noted throughout. .  Gait and station: Gait is normal.    DIAGNOSTIC DATA (LABS, IMAGING, TESTING) - I reviewed patient records, labs, notes, testing and imaging myself where available.  Lab Results  Component Value Date   WBC 6.2 04/04/2024   HGB 14.3 04/04/2024   HCT 43.4 04/04/2024   MCV 92 04/04/2024   PLT 254 04/04/2024      Component Value Date/Time   NA 137 04/04/2024 0930   K 4.2 04/04/2024 0930   CL 101 04/04/2024 0930   CO2 22 04/04/2024 0930   GLUCOSE 99 04/04/2024 0930   GLUCOSE 104 (H) 11/16/2022 1222   BUN 21 04/04/2024 0930   CREATININE 0.71 04/04/2024 0930   CREATININE 0.74 10/11/2019 0945   CALCIUM  9.6 04/04/2024 0930   PROT 7.1 04/04/2024 0930   ALBUMIN 4.3 04/04/2024 0930   AST 28 04/04/2024 0930   ALT 21 04/04/2024 0930   ALKPHOS 97 04/04/2024 0930   BILITOT 0.5 04/04/2024 0930   GFRNONAA >60 11/16/2022 1222   GFRAA 95 11/14/2020 0944   Lab Results  Component Value Date   CHOL 114 04/04/2024   HDL 73 04/04/2024   LDLCALC 31 04/04/2024   TRIG 35 04/04/2024   CHOLHDL 1.6 04/04/2024   Lab Results  Component Value Date   HGBA1C 6.2 (A) 04/04/2024   Lab Results  Component Value Date   VITAMINB12 1,545 (H) 11/14/2020   Lab Results  Component Value Date   TSH 0.724 11/14/2020      ASSESSMENT AND PLAN 63 y.o. year old female  has a past medical history of Adjustment insomnia, Allergy, Asthma, Atrial flutter (HCC) (02/26/2015), Back pain, Bursitis, Cataract, Constipation, Depression, Diabetes mellitus  without complication (HCC), History of atrial fibrillation, History of cancer, Hypertension, Incontinence, Joint pain, Migraine headache, Mobitz type 2 second degree heart block, Neuropathy, Obesity, Other fatigue, Shortness of breath on exertion, and Sleep apnea. here with:   1.  Obstructive sleep apnea on CPAP   --Good treatment of apnea --Encouraged her to continue using CPAP nightly and greater than 4 hours each night -- We will repeat home sleep test pending results may consider other treatment options  2.  Peripheral neuropathy  -- Does not want to be on any oral medication at this time. -- Will try transdermal therapeutics. She can use this cream at night    Advised if symptoms worsen or she develops new symptoms she should let us  know.  Follow-up in 1 year or sooner if needed   Duwaine Russell, MSN, NP-C 07/09/2024, 4:01 PM [facility] [address] [place], [place] [address] [phone]

## 2024-07-10 ENCOUNTER — Encounter: Payer: Self-pay | Admitting: Adult Health

## 2024-07-10 ENCOUNTER — Ambulatory Visit: Admitting: Adult Health

## 2024-07-10 VITALS — BP 129/68 | HR 59 | Ht 71.0 in | Wt 216.0 lb

## 2024-07-10 DIAGNOSIS — E1142 Type 2 diabetes mellitus with diabetic polyneuropathy: Secondary | ICD-10-CM

## 2024-07-10 DIAGNOSIS — G4733 Obstructive sleep apnea (adult) (pediatric): Secondary | ICD-10-CM

## 2024-07-10 DIAGNOSIS — Z7985 Long-term (current) use of injectable non-insulin antidiabetic drugs: Secondary | ICD-10-CM

## 2024-07-10 NOTE — Patient Instructions (Addendum)
 Your Plan:  Continue to monitor symptoms  Call Transdermal therapeutics [phone]  Thank you for coming to see us  at [facility]. I hope we have been able to provide you high quality care today.  You may receive a patient satisfaction survey over the next few weeks. We would appreciate your feedback and comments so that we may continue to improve ourselves and the health of our patients.

## 2024-07-12 ENCOUNTER — Ambulatory Visit: Payer: BC Managed Care – PPO | Admitting: Adult Health

## 2024-07-28 ENCOUNTER — Other Ambulatory Visit: Payer: Self-pay | Admitting: Medical Genetics

## 2024-07-28 DIAGNOSIS — Z006 Encounter for examination for normal comparison and control in clinical research program: Secondary | ICD-10-CM

## 2024-08-01 ENCOUNTER — Ambulatory Visit (INDEPENDENT_AMBULATORY_CARE_PROVIDER_SITE_OTHER): Admitting: Adult Health

## 2024-08-11 ENCOUNTER — Ambulatory Visit: Admitting: Clinical

## 2024-08-14 ENCOUNTER — Encounter (INDEPENDENT_AMBULATORY_CARE_PROVIDER_SITE_OTHER): Payer: Self-pay | Admitting: Adult Health

## 2024-08-14 ENCOUNTER — Ambulatory Visit (INDEPENDENT_AMBULATORY_CARE_PROVIDER_SITE_OTHER): Payer: Self-pay | Admitting: Adult Health

## 2024-08-14 VITALS — BP 117/70 | HR 55 | Temp 98.3°F | Ht 71.0 in | Wt 213.0 lb

## 2024-08-14 DIAGNOSIS — I152 Hypertension secondary to endocrine disorders: Secondary | ICD-10-CM | POA: Diagnosis not present

## 2024-08-14 DIAGNOSIS — L659 Nonscarring hair loss, unspecified: Secondary | ICD-10-CM | POA: Diagnosis not present

## 2024-08-14 DIAGNOSIS — Z6829 Body mass index (BMI) 29.0-29.9, adult: Secondary | ICD-10-CM

## 2024-08-14 DIAGNOSIS — E559 Vitamin D deficiency, unspecified: Secondary | ICD-10-CM

## 2024-08-14 DIAGNOSIS — E1159 Type 2 diabetes mellitus with other circulatory complications: Secondary | ICD-10-CM | POA: Diagnosis not present

## 2024-08-14 DIAGNOSIS — Z7985 Long-term (current) use of injectable non-insulin antidiabetic drugs: Secondary | ICD-10-CM

## 2024-08-14 DIAGNOSIS — E1169 Type 2 diabetes mellitus with other specified complication: Secondary | ICD-10-CM

## 2024-08-14 NOTE — Addendum Note (Signed)
 Addended by: JONEL PEE D on: 08/14/2024 11:16 AM   Modules accepted: Level of Service

## 2024-08-14 NOTE — Progress Notes (Signed)
 WEIGHT SUMMARY AND BIOMETRICS  Vitals Temp: 98.3 F (36.8 C) BP: 117/70 Pulse Rate: (!) 55 SpO2: 97 %   Anthropometric Measurements Height: 5' 11 (1.803 m) Weight: 213 lb (96.6 kg) BMI (Calculated): 29.72 Weight at Last Visit: 211 lb Weight Lost Since Last Visit: 0 Weight Gained Since Last Visit: 2 lb Starting Weight: 268 lb Total Weight Loss (lbs): 55 lb (24.9 kg)   Body Composition  Body Fat %: 40.5 % Fat Mass (lbs): 86.4 lbs Muscle Mass (lbs): 120.4 lbs Total Body Water  (lbs): 86.4 lbs Visceral Fat Rating : 11   Other Clinical Data Fasting: no Labs: no Today's Visit #: 26 Starting Date: 11/14/20    Chief Complaint:   OBESITY Laurie Mckinney is here to discuss her progress with her obesity treatment plan.  She is on the the Category 3 Plan and states she is following her eating plan approximately 40-45 % of the time.  She states she is exercising Walking 30 minutes 7 times per week.  Interim History:  Last check of CBG was over weekend Level was 137 in late afternoon. She has continued on weekly Ozempic  1mg  Denies mass in neck, dysphagia, dyspepsia, persistent hoarseness, abdominal pain, or N/V/C   Reviewed Bioimpedance Results with pt: Muscle Mass:+ 0.8 lb Adipose Mass: + 1 lb  Subjective:   1. Thinning hair She reports thinning hair for years Discussed at length:  Ozempic  and Hair Loss  Ozempic  (semaglutide ), a medication used to treat type 2 diabetes and obesity, has been linked to hair loss in some individuals.  Mechanism of Hair Loss  The exact mechanism by which Ozempic  causes hair loss is not fully understood. However, it is believed that the rapid weight loss induced by the medication may contribute to hair thinning.  Rapid weight loss can lead to:  Nutrient deficiencies, as the body may not be getting enough essential nutrients for hair growth.  Stress on the body, which can disrupt the hair growth cycle.  Changes in hormone levels,  which can also affect hair growth.  Prevalence and Severity  The prevalence of hair loss with Ozempic  is not well-established. However, anecdotal reports suggest that it can occur in a significant number of users. The severity of hair loss can vary widely, from mild thinning to noticeable baldness.  Risk Factors  Individuals who are more likely to experience hair loss with Ozempic  include those who:  Have a history of hair loss Experience rapid weight loss Have nutrient deficiencies  2. Hypertension associated with diabetes (HCC) BP excellent at OV EPIC review- her BP has been well controlled for years She has increased intensity when walking, achieved a sustained HR > 140 when walking over weekend. She denies CP with exertion  3. Vitamin D  deficiency She is on bi-weekly Ergocaliciferol- denies N/V/Muscle Weakness  Assessment/Plan:   1. Thinning hair Shampoos: Certain medicated shampoos can help with scalp issues that contribute to hair loss. Ketoconazole: The OTC version is 1% (Nizoral) and is an anti-fungal shampoo that can be used a few times per week. Other shampoos: Products with zinc (like some Head and Shoulders) or selenium (like some Selsun Blue) are also available. How to use: Lather, leave on the scalp for about five minutes, and then rinse.  Biotin: This B vitamin is found in many foods, and supplements are available.  Add Thyroid Panel at next lab check  2. Hypertension associated with diabetes (HCC) (Primary) Continue healthy eating and regular cardiovascular  3. Vitamin D  deficiency  Check Labs at next OV  4. Type 2 diabetes mellitus with other specified complication, without long-term current use of insulin  (HCC) Continue weekly Ozempic - denies need for refill today  5. BMI 29.0-29.9,adult current 29.7  Laurie Mckinney is currently in the action stage of change. As such, her goal is to continue with weight loss efforts. She has agreed to the Category 3 Plan.    Exercise goals: Older adults should follow the adult guidelines. When older adults cannot meet the adult guidelines, they should be as physically active as their abilities and conditions will allow.  Older adults should do exercises that maintain or improve balance if they are at risk of falling.  Older adults should determine their level of effort for physical activity relative to their level of fitness.  Older adults with chronic conditions should understand whether and how their conditions affect their ability to do regular physical activity safely.  Behavioral modification strategies: increasing lean protein intake, decreasing simple carbohydrates, increasing vegetables, increasing water  intake, no skipping meals, meal planning and cooking strategies, keeping healthy foods in the home, ways to avoid boredom eating, and planning for success.  Laurie Mckinney has agreed to follow-up with our clinic in 4 weeks. She was informed of the importance of frequent follow-up visits to maximize her success with intensive lifestyle modifications for her multiple health conditions.   Check Fasting Labs at next OV  Objective:   Blood pressure 117/70, pulse (!) 55, temperature 98.3 F (36.8 C), height 5' 11 (1.803 m), weight 213 lb (96.6 kg), last menstrual period 01/26/2011, SpO2 97%. Body mass index is 29.71 kg/m.  General: Cooperative, alert, well developed, in no acute distress. HEENT: Conjunctivae and lids unremarkable. Cardiovascular: Regular rhythm.  Lungs: Normal work of breathing. Neurologic: No focal deficits.   Lab Results  Component Value Date   CREATININE 0.71 04/04/2024   BUN 21 04/04/2024   NA 137 04/04/2024   K 4.2 04/04/2024   CL 101 04/04/2024   CO2 22 04/04/2024   Lab Results  Component Value Date   ALT 21 04/04/2024   AST 28 04/04/2024   ALKPHOS 97 04/04/2024   BILITOT 0.5 04/04/2024   Lab Results  Component Value Date   HGBA1C 6.2 (A) 04/04/2024   HGBA1C 6.0 (H)  11/04/2023   HGBA1C 5.8 (A) 03/23/2023   HGBA1C 5.8 (H) 10/29/2022   HGBA1C 5.9 (H) 08/03/2022   Lab Results  Component Value Date   INSULIN  12.6 11/30/2022   INSULIN  9.7 05/13/2021   INSULIN  4.0 11/14/2020   Lab Results  Component Value Date   TSH 0.724 11/14/2020   Lab Results  Component Value Date   CHOL 114 04/04/2024   HDL 73 04/04/2024   LDLCALC 31 04/04/2024   TRIG 35 04/04/2024   CHOLHDL 1.6 04/04/2024   Lab Results  Component Value Date   VD25OH 50.9 11/04/2023   VD25OH 68.8 03/23/2023   VD25OH 29.8 (L) 11/30/2022   Lab Results  Component Value Date   WBC 6.2 04/04/2024   HGB 14.3 04/04/2024   HCT 43.4 04/04/2024   MCV 92 04/04/2024   PLT 254 04/04/2024   No results found for: IRON, TIBC, FERRITIN  Attestation Statements:   Reviewed by clinician on day of visit: allergies, medications, problem list, medical history, surgical history, family history, social history, and previous encounter notes.  Time spent on visit including pre-visit chart review and post-visit care and charting was 26 minutes.   I have reviewed the above documentation for accuracy and completeness,  and I agree with the above. -  Laurie Mckinney d. Egidio Lofgren, NP-C

## 2024-09-06 ENCOUNTER — Other Ambulatory Visit: Payer: Self-pay | Admitting: Family Medicine

## 2024-09-06 DIAGNOSIS — F341 Dysthymic disorder: Secondary | ICD-10-CM

## 2024-09-19 ENCOUNTER — Other Ambulatory Visit (INDEPENDENT_AMBULATORY_CARE_PROVIDER_SITE_OTHER): Payer: Self-pay | Admitting: Adult Health

## 2024-09-19 DIAGNOSIS — E559 Vitamin D deficiency, unspecified: Secondary | ICD-10-CM

## 2024-09-29 ENCOUNTER — Other Ambulatory Visit: Payer: Self-pay | Admitting: Family Medicine

## 2024-09-29 DIAGNOSIS — E1169 Type 2 diabetes mellitus with other specified complication: Secondary | ICD-10-CM

## 2024-10-09 ENCOUNTER — Ambulatory Visit (INDEPENDENT_AMBULATORY_CARE_PROVIDER_SITE_OTHER): Admitting: Adult Health

## 2024-10-09 ENCOUNTER — Encounter (INDEPENDENT_AMBULATORY_CARE_PROVIDER_SITE_OTHER): Payer: Self-pay | Admitting: Adult Health

## 2024-10-09 VITALS — BP 122/77 | HR 75 | Temp 98.7°F | Ht 71.0 in | Wt 213.0 lb

## 2024-10-09 DIAGNOSIS — E785 Hyperlipidemia, unspecified: Secondary | ICD-10-CM | POA: Diagnosis not present

## 2024-10-09 DIAGNOSIS — E559 Vitamin D deficiency, unspecified: Secondary | ICD-10-CM

## 2024-10-09 DIAGNOSIS — E669 Obesity, unspecified: Secondary | ICD-10-CM | POA: Diagnosis not present

## 2024-10-09 DIAGNOSIS — Z6829 Body mass index (BMI) 29.0-29.9, adult: Secondary | ICD-10-CM

## 2024-10-09 DIAGNOSIS — E1159 Type 2 diabetes mellitus with other circulatory complications: Secondary | ICD-10-CM | POA: Diagnosis not present

## 2024-10-09 DIAGNOSIS — L659 Nonscarring hair loss, unspecified: Secondary | ICD-10-CM | POA: Diagnosis not present

## 2024-10-09 DIAGNOSIS — I10 Essential (primary) hypertension: Secondary | ICD-10-CM

## 2024-10-09 DIAGNOSIS — Z7985 Long-term (current) use of injectable non-insulin antidiabetic drugs: Secondary | ICD-10-CM

## 2024-10-09 DIAGNOSIS — E1169 Type 2 diabetes mellitus with other specified complication: Secondary | ICD-10-CM | POA: Diagnosis not present

## 2024-10-09 DIAGNOSIS — I152 Hypertension secondary to endocrine disorders: Secondary | ICD-10-CM

## 2024-10-09 MED ORDER — SEMAGLUTIDE (1 MG/DOSE) 4 MG/3ML ~~LOC~~ SOPN: 1.0000 mg | PEN_INJECTOR | SUBCUTANEOUS | 0 refills | Status: AC

## 2024-10-09 NOTE — Progress Notes (Signed)
 "    WEIGHT SUMMARY AND BIOMETRICS  Vitals Temp: 98.7 F (37.1 C) BP: 122/77 Pulse Rate: 75 SpO2: 97 %   Anthropometric Measurements Height: 5' 11 (1.803 m) Weight: 213 lb (96.6 kg) BMI (Calculated): 29.72 Weight at Last Visit: 213lb Weight Lost Since Last Visit: 0lb Weight Gained Since Last Visit: 0lb Starting Weight: 268lb Total Weight Loss (lbs): 55 lb (24.9 kg)   Body Composition  Body Fat %: 39.8 % Fat Mass (lbs): 85 lbs Muscle Mass (lbs): 122 lbs Total Body Water  (lbs): 85.4 lbs Visceral Fat Rating : 11   Other Clinical Data Fasting: Yes Labs: No Today's Visit #: 68 Starting Date: 11/14/20    Chief Complaint:   OBESITY Laurie Mckinney is here to discuss her progress with her obesity treatment plan.  She is on the the Category 3 Plan and states she is following her eating plan approximately 40 % of the time.  She states she is exercising Walking 30-40 minutes 6-7 times per week.  Interim History:  Reviewed Bioimpedance Results with pt: Muscle Mass:+1.6 lbs Adipose Mass:-1.4 lbs  Her son and daughter in law welcomed their twin boys Nov 2025, the boys were 6 weeks early. Boys are home and thriving!  Subjective:   1. Vitamin D  deficiency  Latest Reference Range & Units 11/30/22 13:03 03/23/23 11:59 11/04/23 11:08  Vitamin D , 25-Hydroxy 30.0 - 100.0 ng/mL 29.8 (L) 68.8 50.9  (L): Data is abnormally low  Vit D level stable and at goal  2. Hyperlipidemia associated with type 2 diabetes mellitus (HCC) PCP Manages daily Lipitor 10mg   3. Type 2 diabetes mellitus with other specified complication, without long-term current use of insulin  (HCC) HWW manages weekly Ozempic  1mg  Denies mass in neck, dysphagia, dyspepsia, persistent hoarseness, abdominal pain, or N/V/C  Patient was counseled on the importance of maintaining healthy lifestyle habits, including balanced nutrition, regular physical activity, and behavioral modifications, while taking antiobesity  medication.   Patient verbalized understanding that medication is an adjunct to, not a replacement for, lifestyle changes and that the long-term success and weight maintenance depend on continued adherence to these strategies.  Inconsistently checking home BG Typically runs < 120s  4. Hypertension associated with diabetes (HCC) BP excellent and at goal at OV She denies tobacco/vape use She is on lisinopril -hydrochlorothiazide  (ZESTORETIC ) 20-25 MG tablet  rivaroxaban  (XARELTO ) 20 MG TABS tablet  atorvastatin  (LIPITOR) 10 MG tablet  Semaglutide , 1 MG/DOSE, 4 MG/3ML SOPN   5. Thinning hair She has been taking OTC supplementation  She feels hair loss has stabilized She has been researching hair toppers  Assessment/Plan:   1. Vitamin D  deficiency Check Labs - VITAMIN D  25 Hydroxy (Vit-D Deficiency, Fractures)  2. Hyperlipidemia associated with type 2 diabetes mellitus (HCC) (Primary) Check Labs - Comprehensive metabolic panel with GFR  3. Type 2 diabetes mellitus with other specified complication, without long-term current use of insulin  (HCC) Check Labs - Hemoglobin A1c - Insulin , random Refill []   Semaglutide , 1 MG/DOSE, 4 MG/3ML SOPN Inject 1 mg as directed once a week. Dispense: 9 mL, Refills: 0 ordered   4. Hypertension associated with diabetes (HCC) Continue on lisinopril -hydrochlorothiazide  (ZESTORETIC ) 20-25 MG tablet  rivaroxaban  (XARELTO ) 20 MG TABS tablet  atorvastatin  (LIPITOR) 10 MG tablet  Semaglutide , 1 MG/DOSE, 4 MG/3ML SOPN   5. Thinning hair Check Labs - TSH + free T4  6. BMI 29.0-29.9,adult current 29.8  Laurie Mckinney is currently in the action stage of change. As such, her goal is to continue with weight loss  efforts. She has agreed to the Category 3 Plan.   Exercise goals: Older adults should follow the adult guidelines. When older adults cannot meet the adult guidelines, they should be as physically active as their abilities and conditions will allow.   Older adults should do exercises that maintain or improve balance if they are at risk of falling.  Older adults should determine their level of effort for physical activity relative to their level of fitness.  Older adults with chronic conditions should understand whether and how their conditions affect their ability to do regular physical activity safely.  Behavioral modification strategies: increasing lean protein intake, decreasing simple carbohydrates, increasing vegetables, increasing water  intake, meal planning and cooking strategies, keeping healthy foods in the home, ways to avoid boredom eating, and planning for success.  Laurie Mckinney has agreed to follow-up with our clinic in 4 weeks. She was informed of the importance of frequent follow-up visits to maximize her success with intensive lifestyle modifications for her multiple health conditions.   Laurie Mckinney was informed we would discuss her lab results at her next visit unless there is a critical issue that needs to be addressed sooner. Laurie Mckinney agreed to keep her next visit at the agreed upon time to discuss these results.  Objective:   Blood pressure 122/77, pulse 75, temperature 98.7 F (37.1 C), height 5' 11 (1.803 m), weight 213 lb (96.6 kg), last menstrual period 01/26/2011, SpO2 97%. Body mass index is 29.71 kg/m.  General: Cooperative, alert, well developed, in no acute distress. HEENT: Conjunctivae and lids unremarkable. Cardiovascular: Regular rhythm.  Lungs: Normal work of breathing. Neurologic: No focal deficits.   Lab Results  Component Value Date   CREATININE 0.71 04/04/2024   BUN 21 04/04/2024   NA 137 04/04/2024   K 4.2 04/04/2024   CL 101 04/04/2024   CO2 22 04/04/2024   Lab Results  Component Value Date   ALT 21 04/04/2024   AST 28 04/04/2024   ALKPHOS 97 04/04/2024   BILITOT 0.5 04/04/2024   Lab Results  Component Value Date   HGBA1C 6.2 (A) 04/04/2024   HGBA1C 6.0 (H) 11/04/2023   HGBA1C 5.8 (A)  03/23/2023   HGBA1C 5.8 (H) 10/29/2022   HGBA1C 5.9 (H) 08/03/2022   Lab Results  Component Value Date   INSULIN  12.6 11/30/2022   INSULIN  9.7 05/13/2021   INSULIN  4.0 11/14/2020   Lab Results  Component Value Date   TSH 0.724 11/14/2020   Lab Results  Component Value Date   CHOL 114 04/04/2024   HDL 73 04/04/2024   LDLCALC 31 04/04/2024   TRIG 35 04/04/2024   CHOLHDL 1.6 04/04/2024   Lab Results  Component Value Date   VD25OH 50.9 11/04/2023   VD25OH 68.8 03/23/2023   VD25OH 29.8 (L) 11/30/2022   Lab Results  Component Value Date   WBC 6.2 04/04/2024   HGB 14.3 04/04/2024   HCT 43.4 04/04/2024   MCV 92 04/04/2024   PLT 254 04/04/2024   No results found for: IRON, TIBC, FERRITIN  Attestation Statements:   Reviewed by clinician on day of visit: allergies, medications, problem list, medical history, surgical history, family history, social history, and previous encounter notes.  I have reviewed the above documentation for accuracy and completeness, and I agree with the above. -  Kier Smead d. Tyniah Kastens, NP-C "

## 2024-10-10 ENCOUNTER — Encounter (INDEPENDENT_AMBULATORY_CARE_PROVIDER_SITE_OTHER): Payer: Self-pay | Admitting: Adult Health

## 2024-10-10 LAB — CBC WITH DIFFERENTIAL/PLATELET
Basophils Absolute: 0 x10E3/uL (ref 0.0–0.2)
Basos: 0 %
EOS (ABSOLUTE): 0.1 x10E3/uL (ref 0.0–0.4)
Eos: 1 %
Hematocrit: 43.3 % (ref 34.0–46.6)
Hemoglobin: 14.6 g/dL (ref 11.1–15.9)
Immature Grans (Abs): 0 x10E3/uL (ref 0.0–0.1)
Immature Granulocytes: 0 %
Lymphocytes Absolute: 1.6 x10E3/uL (ref 0.7–3.1)
Lymphs: 26 %
MCH: 31.2 pg (ref 26.6–33.0)
MCHC: 33.7 g/dL (ref 31.5–35.7)
MCV: 93 fL (ref 79–97)
Monocytes Absolute: 0.5 x10E3/uL (ref 0.1–0.9)
Monocytes: 8 %
Neutrophils Absolute: 4 x10E3/uL (ref 1.4–7.0)
Neutrophils: 65 %
Platelets: 276 x10E3/uL (ref 150–450)
RBC: 4.68 x10E6/uL (ref 3.77–5.28)
RDW: 12.3 % (ref 11.7–15.4)
WBC: 6.1 x10E3/uL (ref 3.4–10.8)

## 2024-10-10 LAB — COMPREHENSIVE METABOLIC PANEL WITH GFR
ALT: 22 IU/L (ref 0–32)
AST: 30 IU/L (ref 0–40)
Albumin: 4.3 g/dL (ref 3.9–4.9)
Alkaline Phosphatase: 80 IU/L (ref 49–135)
BUN/Creatinine Ratio: 21 (ref 12–28)
BUN: 15 mg/dL (ref 8–27)
Bilirubin Total: 0.7 mg/dL (ref 0.0–1.2)
CO2: 23 mmol/L (ref 20–29)
Calcium: 9.7 mg/dL (ref 8.7–10.3)
Chloride: 102 mmol/L (ref 96–106)
Creatinine, Ser: 0.73 mg/dL (ref 0.57–1.00)
Globulin, Total: 3 g/dL (ref 1.5–4.5)
Glucose: 98 mg/dL (ref 70–99)
Potassium: 4.2 mmol/L (ref 3.5–5.2)
Sodium: 140 mmol/L (ref 134–144)
Total Protein: 7.3 g/dL (ref 6.0–8.5)
eGFR: 91 mL/min/1.73

## 2024-10-10 LAB — HEMOGLOBIN A1C
Est. average glucose Bld gHb Est-mCnc: 123 mg/dL
Hgb A1c MFr Bld: 5.9 % — ABNORMAL HIGH (ref 4.8–5.6)

## 2024-10-10 LAB — TSH+FREE T4
Free T4: 1.3 ng/dL (ref 0.82–1.77)
TSH: 0.382 u[IU]/mL — ABNORMAL LOW (ref 0.450–4.500)

## 2024-10-10 LAB — VITAMIN D 25 HYDROXY (VIT D DEFICIENCY, FRACTURES): Vit D, 25-Hydroxy: 47.3 ng/mL (ref 30.0–100.0)

## 2024-10-10 LAB — INSULIN, RANDOM: INSULIN: 20.2 u[IU]/mL (ref 2.6–24.9)

## 2024-10-29 ENCOUNTER — Other Ambulatory Visit: Payer: Self-pay | Admitting: Family Medicine

## 2024-10-29 DIAGNOSIS — F341 Dysthymic disorder: Secondary | ICD-10-CM

## 2024-11-01 ENCOUNTER — Telehealth: Payer: Self-pay | Admitting: Family Medicine

## 2024-11-01 NOTE — Telephone Encounter (Signed)
 [facility] fax  Lisinopril - hctz

## 2024-11-02 ENCOUNTER — Other Ambulatory Visit: Payer: Self-pay

## 2024-11-02 DIAGNOSIS — I152 Hypertension secondary to endocrine disorders: Secondary | ICD-10-CM

## 2024-11-02 MED ORDER — LISINOPRIL-HYDROCHLOROTHIAZIDE 20-25 MG PO TABS: 1.0000 | ORAL_TABLET | Freq: Every day | ORAL | 3 refills | Status: DC

## 2024-11-02 NOTE — Telephone Encounter (Signed)
 RX refilled

## 2024-11-06 ENCOUNTER — Ambulatory Visit (INDEPENDENT_AMBULATORY_CARE_PROVIDER_SITE_OTHER): Admitting: Adult Health

## 2024-11-08 ENCOUNTER — Telehealth: Payer: Self-pay | Admitting: Family Medicine

## 2024-11-08 ENCOUNTER — Telehealth: Payer: Self-pay

## 2024-11-08 NOTE — Telephone Encounter (Signed)
"   Jon can you check on options for this please    11/08/24 10:14 AM Note Copied from CRM #[id number]. Topic: Clinical - Medication Question >> Nov 08, 2024 10:01 AM Laurie Mckinney wrote: Reason for CRM: The patient would like to please be contacted by a member of clinical staff to discuss options for having some of their prescriptions from brand name to generic    The patient has been told that a three month supply of their rivaroxaban  (XARELTO ) 20 MG TABS tablet [[id number]] prescription would be $2082.99    The patient has been told that their prescription for Semaglutide , 1 MG/DOSE, 4 MG/3ML SOPN [[id number]] will be $3036.99 for an 84 day supply    Please contact the patient further when possible     "

## 2024-11-08 NOTE — Telephone Encounter (Signed)
 Copied from CRM #[id number]. Topic: Clinical - Medication Question >> Nov 08, 2024 10:01 AM Everette C wrote: Reason for CRM: The patient would like to please be contacted by a member of clinical staff to discuss options for having some of their prescriptions from brand name to generic   The patient has been told that a three month supply of their rivaroxaban  (XARELTO ) 20 MG TABS tablet [[id number]] prescription would be $2082.99   The patient has been told that their prescription for Semaglutide , 1 MG/DOSE, 4 MG/3ML SOPN [[id number]] will be $3036.99 for an 84 day supply   Please contact the patient further when possible

## 2024-11-09 NOTE — Telephone Encounter (Signed)
 Referred to Pharmacist Jon for assistance

## 2024-11-09 NOTE — Progress Notes (Signed)
" ° °  11/09/2024  Patient ID: Laurie Mckinney, female   DOB: 11/10/1960, 64 y.o.   MRN: [id number]  Reviewed patient's insurance plan benefits and projected costs for Ozempic  and Xarelto  for 2026.  Contacted patient and reviewed the following:  [facility] price if patient were to pick up meds today: Ozempic  84 DS: $956 Ozempic  28 DS: $503Xarelto  90 DS: $136.37 Xarelto  30DS: $45.55  Per insurance formulary: Ozempic  and Xarelto  are Tier 3 Preferred Brand Medications, which means medications will cost patient 22% of whatever pharmacy is charging for that medication. This is after the patient's deductible/initial coverage stage is met (exact deductible amount not published).  Explained to patient that this means that cost of drug may vary based on how much the medication is costing at various pharmacies.  Insurance formulary projects that Ozempic  would be $226.72 per 28ds, based on projected cost of ozempic  at 1,030.54. [facility] must be charging more than that cost, since price does not match. Informed patient she may have to shop around at pharmacies to find the cheapest cost.  Also explained that Medicare plans are offering prescription payment plans this year, where patient can contact the insurance company and try to spread out the cost throughout the year, as opposed to paying the deductible right up front. Patient would need to contact insurance directly for more info and to opt in if she would like.  Asked patient to update as on what she decides and if she needs further assistance. Counseled to reach back out ASAP if she cannot get this medications so we can discuss alternatives.  Jon VEAR Lindau, PharmD Clinical Pharmacist [phone]  "

## 2024-11-10 ENCOUNTER — Telehealth: Payer: Self-pay | Admitting: Internal Medicine

## 2024-11-10 DIAGNOSIS — F341 Dysthymic disorder: Secondary | ICD-10-CM

## 2024-11-10 DIAGNOSIS — I152 Hypertension secondary to endocrine disorders: Secondary | ICD-10-CM

## 2024-11-10 DIAGNOSIS — E1169 Type 2 diabetes mellitus with other specified complication: Secondary | ICD-10-CM

## 2024-11-10 MED ORDER — LISINOPRIL-HYDROCHLOROTHIAZIDE 20-25 MG PO TABS: 1.0000 | ORAL_TABLET | Freq: Every day | ORAL | 1 refills | Status: AC

## 2024-11-10 MED ORDER — BUPROPION HCL ER (SR) 200 MG PO TB12: 200.0000 mg | ORAL_TABLET | Freq: Every day | ORAL | 0 refills | Status: AC

## 2024-11-10 MED ORDER — ATORVASTATIN CALCIUM 10 MG PO TABS: 10.0000 mg | ORAL_TABLET | Freq: Every day | ORAL | 1 refills | Status: AC

## 2024-11-10 MED ORDER — PAROXETINE HCL 20 MG PO TABS: 20.0000 mg | ORAL_TABLET | Freq: Every day | ORAL | 1 refills | Status: AC

## 2024-11-10 NOTE — Telephone Encounter (Signed)
 Sent in refills  Copied from CRM [id number]. Topic: Clinical - Medication Refill >> Nov 10, 2024  9:57 AM Willma R wrote: Medication:  buPROPion  (WELLBUTRIN  SR) 200 MG 12 hr tablet atorvastatin  (LIPITOR) 10 MG tablet PARoxetine  (PAXIL ) 20 MG tablet  Has the patient contacted their pharmacy? yes  This is the patient's preferred pharmacy:  [facility] [place], [place] - [address] [place] [address] Phone: [phone] Fax: [phone]  Is this the correct pharmacy for this prescription? Yes   Has the prescription been filled recently? No  Is the patient out of the medication? No  Has the patient been seen for an appointment in the last year OR does the patient have an upcoming appointment? Yes  Can we respond through MyChart? Yes  Agent: Please be advised that Rx refills may take up to 3 business days. We ask that you follow-up with your pharmacy.

## 2024-11-10 NOTE — Telephone Encounter (Signed)
 Sent in refill  Copied from CRM [id number]. Topic: Clinical - Prescription Issue >> Nov 10, 2024  9:56 AM Willma SAUNDERS wrote: Reason for CRM: Vange from [facility] calling to have patients prescription for lisinopril -hydrochlorothiazide  (ZESTORETIC ) 20-25 MG tablet sent to [facility].   [facility] - [place], [place] - [address] [place] [address] Phone: [phone] Fax: [phone]  Rodger can be reached at [phone]

## 2025-04-09 ENCOUNTER — Encounter: Admitting: Family Medicine

## 2025-07-06 ENCOUNTER — Ambulatory Visit: Admitting: Nurse Practitioner

## 2025-07-11 ENCOUNTER — Ambulatory Visit: Payer: BC Managed Care – PPO | Admitting: Adult Health

## 2025-07-16 ENCOUNTER — Ambulatory Visit: Admitting: Adult Health
